# Patient Record
Sex: Female | Born: 1945 | Race: White | Hispanic: No | Marital: Married | Smoking: Never smoker
Health system: Southern US, Community
[De-identification: ages and names within clinical notes are randomized; demographics above are authoritative.]

## PROBLEM LIST (undated history)

## (undated) DIAGNOSIS — F329 Major depressive disorder, single episode, unspecified: Secondary | ICD-10-CM

## (undated) DIAGNOSIS — G4730 Sleep apnea, unspecified: Secondary | ICD-10-CM

## (undated) DIAGNOSIS — E7800 Pure hypercholesterolemia, unspecified: Secondary | ICD-10-CM

## (undated) DIAGNOSIS — G4733 Obstructive sleep apnea (adult) (pediatric): Secondary | ICD-10-CM

## (undated) DIAGNOSIS — T8859XA Other complications of anesthesia, initial encounter: Secondary | ICD-10-CM

## (undated) DIAGNOSIS — Z9889 Other specified postprocedural states: Secondary | ICD-10-CM

## (undated) DIAGNOSIS — J45909 Unspecified asthma, uncomplicated: Secondary | ICD-10-CM

## (undated) DIAGNOSIS — I4891 Unspecified atrial fibrillation: Secondary | ICD-10-CM

## (undated) DIAGNOSIS — I499 Cardiac arrhythmia, unspecified: Secondary | ICD-10-CM

## (undated) DIAGNOSIS — M1990 Unspecified osteoarthritis, unspecified site: Secondary | ICD-10-CM

## (undated) DIAGNOSIS — K219 Gastro-esophageal reflux disease without esophagitis: Secondary | ICD-10-CM

## (undated) DIAGNOSIS — F32A Depression, unspecified: Secondary | ICD-10-CM

## (undated) DIAGNOSIS — R112 Nausea with vomiting, unspecified: Secondary | ICD-10-CM

## (undated) DIAGNOSIS — T4145XA Adverse effect of unspecified anesthetic, initial encounter: Secondary | ICD-10-CM

## (undated) DIAGNOSIS — J449 Chronic obstructive pulmonary disease, unspecified: Secondary | ICD-10-CM

## (undated) DIAGNOSIS — G2581 Restless legs syndrome: Secondary | ICD-10-CM

## (undated) DIAGNOSIS — I10 Essential (primary) hypertension: Secondary | ICD-10-CM

## (undated) DIAGNOSIS — E119 Type 2 diabetes mellitus without complications: Secondary | ICD-10-CM

## (undated) HISTORY — DX: Sleep apnea, unspecified: G47.30

## (undated) HISTORY — DX: Essential (primary) hypertension: I10

## (undated) HISTORY — DX: Unspecified atrial fibrillation: I48.91

## (undated) HISTORY — DX: Obstructive sleep apnea (adult) (pediatric): G47.33

## (undated) HISTORY — DX: Restless legs syndrome: G25.81

## (undated) HISTORY — DX: Chronic obstructive pulmonary disease, unspecified: J44.9

## (undated) HISTORY — DX: Major depressive disorder, single episode, unspecified: F32.9

## (undated) HISTORY — DX: Type 2 diabetes mellitus without complications: E11.9

## (undated) HISTORY — DX: Pure hypercholesterolemia, unspecified: E78.00

## (undated) HISTORY — PX: CATARACT EXTRACTION: SUR2

## (undated) HISTORY — DX: Unspecified osteoarthritis, unspecified site: M19.90

## (undated) HISTORY — DX: Depression, unspecified: F32.A

## (undated) HISTORY — PX: ATRIAL FIBRILLATION ABLATION: EP1191

---
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
## 2012-06-24 DIAGNOSIS — Z6837 Body mass index (BMI) 37.0-37.9, adult: Secondary | ICD-10-CM | POA: Diagnosis not present

## 2012-06-24 DIAGNOSIS — F341 Dysthymic disorder: Secondary | ICD-10-CM | POA: Diagnosis not present

## 2012-06-24 DIAGNOSIS — I10 Essential (primary) hypertension: Secondary | ICD-10-CM | POA: Diagnosis not present

## 2012-07-24 DIAGNOSIS — J189 Pneumonia, unspecified organism: Secondary | ICD-10-CM | POA: Diagnosis not present

## 2012-11-04 DIAGNOSIS — Z1231 Encounter for screening mammogram for malignant neoplasm of breast: Secondary | ICD-10-CM | POA: Diagnosis not present

## 2012-11-26 DIAGNOSIS — I10 Essential (primary) hypertension: Secondary | ICD-10-CM | POA: Diagnosis not present

## 2012-11-26 DIAGNOSIS — J301 Allergic rhinitis due to pollen: Secondary | ICD-10-CM | POA: Diagnosis not present

## 2012-11-26 DIAGNOSIS — R7309 Other abnormal glucose: Secondary | ICD-10-CM | POA: Diagnosis not present

## 2012-11-26 DIAGNOSIS — E782 Mixed hyperlipidemia: Secondary | ICD-10-CM | POA: Diagnosis not present

## 2012-11-26 DIAGNOSIS — E7800 Pure hypercholesterolemia, unspecified: Secondary | ICD-10-CM | POA: Diagnosis not present

## 2012-11-26 DIAGNOSIS — I4891 Unspecified atrial fibrillation: Secondary | ICD-10-CM | POA: Diagnosis not present

## 2012-12-22 DIAGNOSIS — Z1211 Encounter for screening for malignant neoplasm of colon: Secondary | ICD-10-CM | POA: Diagnosis not present

## 2013-01-04 DIAGNOSIS — F3289 Other specified depressive episodes: Secondary | ICD-10-CM | POA: Diagnosis not present

## 2013-01-04 DIAGNOSIS — Z79899 Other long term (current) drug therapy: Secondary | ICD-10-CM | POA: Diagnosis not present

## 2013-01-04 DIAGNOSIS — I10 Essential (primary) hypertension: Secondary | ICD-10-CM | POA: Diagnosis not present

## 2013-01-04 DIAGNOSIS — E7800 Pure hypercholesterolemia, unspecified: Secondary | ICD-10-CM | POA: Diagnosis not present

## 2013-01-04 DIAGNOSIS — F411 Generalized anxiety disorder: Secondary | ICD-10-CM | POA: Diagnosis not present

## 2013-01-04 DIAGNOSIS — I4891 Unspecified atrial fibrillation: Secondary | ICD-10-CM | POA: Diagnosis not present

## 2013-01-04 DIAGNOSIS — K648 Other hemorrhoids: Secondary | ICD-10-CM | POA: Diagnosis not present

## 2013-01-04 DIAGNOSIS — R7309 Other abnormal glucose: Secondary | ICD-10-CM | POA: Diagnosis not present

## 2013-01-04 DIAGNOSIS — K5730 Diverticulosis of large intestine without perforation or abscess without bleeding: Secondary | ICD-10-CM | POA: Diagnosis not present

## 2013-01-04 DIAGNOSIS — F329 Major depressive disorder, single episode, unspecified: Secondary | ICD-10-CM | POA: Diagnosis not present

## 2013-01-04 DIAGNOSIS — Z1211 Encounter for screening for malignant neoplasm of colon: Secondary | ICD-10-CM | POA: Diagnosis not present

## 2013-01-04 DIAGNOSIS — G4733 Obstructive sleep apnea (adult) (pediatric): Secondary | ICD-10-CM | POA: Diagnosis not present

## 2013-01-04 DIAGNOSIS — G2581 Restless legs syndrome: Secondary | ICD-10-CM | POA: Diagnosis not present

## 2013-01-04 HISTORY — PX: COLONOSCOPY: SHX174

## 2013-03-04 DIAGNOSIS — I10 Essential (primary) hypertension: Secondary | ICD-10-CM | POA: Diagnosis not present

## 2013-03-04 DIAGNOSIS — F341 Dysthymic disorder: Secondary | ICD-10-CM | POA: Diagnosis not present

## 2013-03-04 DIAGNOSIS — R7309 Other abnormal glucose: Secondary | ICD-10-CM | POA: Diagnosis not present

## 2013-03-04 DIAGNOSIS — H6120 Impacted cerumen, unspecified ear: Secondary | ICD-10-CM | POA: Diagnosis not present

## 2013-03-04 DIAGNOSIS — D485 Neoplasm of uncertain behavior of skin: Secondary | ICD-10-CM | POA: Diagnosis not present

## 2013-03-04 DIAGNOSIS — I4891 Unspecified atrial fibrillation: Secondary | ICD-10-CM | POA: Diagnosis not present

## 2013-03-04 DIAGNOSIS — E7800 Pure hypercholesterolemia, unspecified: Secondary | ICD-10-CM | POA: Diagnosis not present

## 2013-03-04 DIAGNOSIS — G2581 Restless legs syndrome: Secondary | ICD-10-CM | POA: Diagnosis not present

## 2013-03-05 DIAGNOSIS — G4733 Obstructive sleep apnea (adult) (pediatric): Secondary | ICD-10-CM | POA: Diagnosis not present

## 2013-03-05 DIAGNOSIS — I4891 Unspecified atrial fibrillation: Secondary | ICD-10-CM | POA: Diagnosis not present

## 2013-03-11 DIAGNOSIS — I4891 Unspecified atrial fibrillation: Secondary | ICD-10-CM | POA: Diagnosis not present

## 2013-03-11 DIAGNOSIS — G4733 Obstructive sleep apnea (adult) (pediatric): Secondary | ICD-10-CM | POA: Diagnosis not present

## 2013-03-17 DIAGNOSIS — G4733 Obstructive sleep apnea (adult) (pediatric): Secondary | ICD-10-CM | POA: Diagnosis not present

## 2013-03-17 DIAGNOSIS — I4891 Unspecified atrial fibrillation: Secondary | ICD-10-CM | POA: Diagnosis not present

## 2013-03-23 DIAGNOSIS — I4891 Unspecified atrial fibrillation: Secondary | ICD-10-CM | POA: Diagnosis not present

## 2013-03-23 DIAGNOSIS — G4733 Obstructive sleep apnea (adult) (pediatric): Secondary | ICD-10-CM | POA: Diagnosis not present

## 2013-03-29 DIAGNOSIS — L821 Other seborrheic keratosis: Secondary | ICD-10-CM | POA: Diagnosis not present

## 2013-03-29 DIAGNOSIS — D235 Other benign neoplasm of skin of trunk: Secondary | ICD-10-CM | POA: Diagnosis not present

## 2013-03-29 DIAGNOSIS — D485 Neoplasm of uncertain behavior of skin: Secondary | ICD-10-CM | POA: Diagnosis not present

## 2013-03-29 DIAGNOSIS — L578 Other skin changes due to chronic exposure to nonionizing radiation: Secondary | ICD-10-CM | POA: Diagnosis not present

## 2013-03-29 DIAGNOSIS — L981 Factitial dermatitis: Secondary | ICD-10-CM | POA: Diagnosis not present

## 2013-03-31 DIAGNOSIS — Z886 Allergy status to analgesic agent status: Secondary | ICD-10-CM | POA: Diagnosis not present

## 2013-03-31 DIAGNOSIS — F411 Generalized anxiety disorder: Secondary | ICD-10-CM | POA: Diagnosis not present

## 2013-03-31 DIAGNOSIS — I4891 Unspecified atrial fibrillation: Secondary | ICD-10-CM | POA: Diagnosis not present

## 2013-03-31 DIAGNOSIS — Z7901 Long term (current) use of anticoagulants: Secondary | ICD-10-CM | POA: Diagnosis not present

## 2013-03-31 DIAGNOSIS — G2581 Restless legs syndrome: Secondary | ICD-10-CM | POA: Diagnosis not present

## 2013-03-31 DIAGNOSIS — G43909 Migraine, unspecified, not intractable, without status migrainosus: Secondary | ICD-10-CM | POA: Diagnosis not present

## 2013-03-31 DIAGNOSIS — M19049 Primary osteoarthritis, unspecified hand: Secondary | ICD-10-CM | POA: Diagnosis not present

## 2013-03-31 DIAGNOSIS — Z79899 Other long term (current) drug therapy: Secondary | ICD-10-CM | POA: Diagnosis not present

## 2013-03-31 DIAGNOSIS — I10 Essential (primary) hypertension: Secondary | ICD-10-CM | POA: Diagnosis not present

## 2013-03-31 DIAGNOSIS — K5730 Diverticulosis of large intestine without perforation or abscess without bleeding: Secondary | ICD-10-CM | POA: Diagnosis not present

## 2013-04-12 DIAGNOSIS — D2330 Other benign neoplasm of skin of unspecified part of face: Secondary | ICD-10-CM | POA: Diagnosis not present

## 2013-04-12 DIAGNOSIS — C44319 Basal cell carcinoma of skin of other parts of face: Secondary | ICD-10-CM | POA: Diagnosis not present

## 2013-04-12 DIAGNOSIS — L570 Actinic keratosis: Secondary | ICD-10-CM | POA: Diagnosis not present

## 2013-04-12 DIAGNOSIS — L821 Other seborrheic keratosis: Secondary | ICD-10-CM | POA: Diagnosis not present

## 2013-04-12 DIAGNOSIS — L820 Inflamed seborrheic keratosis: Secondary | ICD-10-CM | POA: Diagnosis not present

## 2013-04-28 DIAGNOSIS — I10 Essential (primary) hypertension: Secondary | ICD-10-CM | POA: Diagnosis not present

## 2013-04-28 DIAGNOSIS — G4733 Obstructive sleep apnea (adult) (pediatric): Secondary | ICD-10-CM | POA: Diagnosis not present

## 2013-04-28 DIAGNOSIS — I4891 Unspecified atrial fibrillation: Secondary | ICD-10-CM | POA: Diagnosis not present

## 2013-05-12 DIAGNOSIS — G4733 Obstructive sleep apnea (adult) (pediatric): Secondary | ICD-10-CM | POA: Diagnosis not present

## 2013-05-12 DIAGNOSIS — I4891 Unspecified atrial fibrillation: Secondary | ICD-10-CM | POA: Diagnosis not present

## 2013-05-12 DIAGNOSIS — E785 Hyperlipidemia, unspecified: Secondary | ICD-10-CM | POA: Diagnosis not present

## 2013-05-12 DIAGNOSIS — I10 Essential (primary) hypertension: Secondary | ICD-10-CM | POA: Diagnosis not present

## 2013-05-28 DIAGNOSIS — I2510 Atherosclerotic heart disease of native coronary artery without angina pectoris: Secondary | ICD-10-CM | POA: Diagnosis not present

## 2013-05-28 DIAGNOSIS — Z886 Allergy status to analgesic agent status: Secondary | ICD-10-CM | POA: Diagnosis not present

## 2013-05-28 DIAGNOSIS — G2581 Restless legs syndrome: Secondary | ICD-10-CM | POA: Diagnosis not present

## 2013-05-28 DIAGNOSIS — I4891 Unspecified atrial fibrillation: Secondary | ICD-10-CM | POA: Diagnosis not present

## 2013-05-28 DIAGNOSIS — R509 Fever, unspecified: Secondary | ICD-10-CM | POA: Diagnosis not present

## 2013-05-28 DIAGNOSIS — F411 Generalized anxiety disorder: Secondary | ICD-10-CM | POA: Diagnosis not present

## 2013-05-28 DIAGNOSIS — Z7901 Long term (current) use of anticoagulants: Secondary | ICD-10-CM | POA: Diagnosis not present

## 2013-05-28 DIAGNOSIS — I10 Essential (primary) hypertension: Secondary | ICD-10-CM | POA: Diagnosis not present

## 2013-05-28 DIAGNOSIS — G4733 Obstructive sleep apnea (adult) (pediatric): Secondary | ICD-10-CM | POA: Diagnosis not present

## 2013-05-28 DIAGNOSIS — M1990 Unspecified osteoarthritis, unspecified site: Secondary | ICD-10-CM | POA: Diagnosis not present

## 2013-05-28 DIAGNOSIS — Z79899 Other long term (current) drug therapy: Secondary | ICD-10-CM | POA: Diagnosis not present

## 2013-05-28 DIAGNOSIS — D72829 Elevated white blood cell count, unspecified: Secondary | ICD-10-CM | POA: Diagnosis not present

## 2013-05-28 DIAGNOSIS — E785 Hyperlipidemia, unspecified: Secondary | ICD-10-CM | POA: Diagnosis not present

## 2013-05-28 DIAGNOSIS — Z23 Encounter for immunization: Secondary | ICD-10-CM | POA: Diagnosis not present

## 2013-05-28 DIAGNOSIS — J309 Allergic rhinitis, unspecified: Secondary | ICD-10-CM | POA: Diagnosis not present

## 2013-05-31 DIAGNOSIS — G4733 Obstructive sleep apnea (adult) (pediatric): Secondary | ICD-10-CM | POA: Diagnosis not present

## 2013-05-31 DIAGNOSIS — Z79899 Other long term (current) drug therapy: Secondary | ICD-10-CM | POA: Diagnosis not present

## 2013-05-31 DIAGNOSIS — E785 Hyperlipidemia, unspecified: Secondary | ICD-10-CM | POA: Diagnosis not present

## 2013-05-31 DIAGNOSIS — D72829 Elevated white blood cell count, unspecified: Secondary | ICD-10-CM | POA: Diagnosis not present

## 2013-05-31 DIAGNOSIS — I4891 Unspecified atrial fibrillation: Secondary | ICD-10-CM | POA: Diagnosis not present

## 2013-05-31 DIAGNOSIS — Z7901 Long term (current) use of anticoagulants: Secondary | ICD-10-CM | POA: Diagnosis not present

## 2013-05-31 DIAGNOSIS — I10 Essential (primary) hypertension: Secondary | ICD-10-CM | POA: Diagnosis not present

## 2013-05-31 DIAGNOSIS — R509 Fever, unspecified: Secondary | ICD-10-CM | POA: Diagnosis not present

## 2013-06-01 DIAGNOSIS — G4733 Obstructive sleep apnea (adult) (pediatric): Secondary | ICD-10-CM | POA: Diagnosis not present

## 2013-06-01 DIAGNOSIS — I10 Essential (primary) hypertension: Secondary | ICD-10-CM | POA: Diagnosis not present

## 2013-06-01 DIAGNOSIS — R509 Fever, unspecified: Secondary | ICD-10-CM | POA: Diagnosis not present

## 2013-06-01 DIAGNOSIS — Z79899 Other long term (current) drug therapy: Secondary | ICD-10-CM | POA: Diagnosis not present

## 2013-06-01 DIAGNOSIS — Z7901 Long term (current) use of anticoagulants: Secondary | ICD-10-CM | POA: Diagnosis not present

## 2013-06-01 DIAGNOSIS — R918 Other nonspecific abnormal finding of lung field: Secondary | ICD-10-CM | POA: Diagnosis not present

## 2013-06-01 DIAGNOSIS — I4891 Unspecified atrial fibrillation: Secondary | ICD-10-CM | POA: Diagnosis not present

## 2013-06-02 DIAGNOSIS — I4891 Unspecified atrial fibrillation: Secondary | ICD-10-CM | POA: Diagnosis not present

## 2013-06-02 DIAGNOSIS — Z79899 Other long term (current) drug therapy: Secondary | ICD-10-CM | POA: Diagnosis not present

## 2013-06-02 DIAGNOSIS — Z7901 Long term (current) use of anticoagulants: Secondary | ICD-10-CM | POA: Diagnosis not present

## 2013-06-02 DIAGNOSIS — R509 Fever, unspecified: Secondary | ICD-10-CM | POA: Diagnosis not present

## 2013-06-02 DIAGNOSIS — G4733 Obstructive sleep apnea (adult) (pediatric): Secondary | ICD-10-CM | POA: Diagnosis not present

## 2013-06-02 DIAGNOSIS — I10 Essential (primary) hypertension: Secondary | ICD-10-CM | POA: Diagnosis not present

## 2013-06-07 DIAGNOSIS — M79609 Pain in unspecified limb: Secondary | ICD-10-CM | POA: Diagnosis not present

## 2013-06-07 DIAGNOSIS — I4891 Unspecified atrial fibrillation: Secondary | ICD-10-CM | POA: Diagnosis not present

## 2013-07-06 DIAGNOSIS — I10 Essential (primary) hypertension: Secondary | ICD-10-CM | POA: Diagnosis not present

## 2013-07-06 DIAGNOSIS — Z9889 Other specified postprocedural states: Secondary | ICD-10-CM | POA: Diagnosis not present

## 2013-07-06 DIAGNOSIS — I4891 Unspecified atrial fibrillation: Secondary | ICD-10-CM | POA: Diagnosis not present

## 2013-07-27 DIAGNOSIS — L570 Actinic keratosis: Secondary | ICD-10-CM | POA: Diagnosis not present

## 2013-07-27 DIAGNOSIS — R7309 Other abnormal glucose: Secondary | ICD-10-CM | POA: Diagnosis not present

## 2013-07-27 DIAGNOSIS — F341 Dysthymic disorder: Secondary | ICD-10-CM | POA: Diagnosis not present

## 2013-07-27 DIAGNOSIS — I4891 Unspecified atrial fibrillation: Secondary | ICD-10-CM | POA: Diagnosis not present

## 2013-07-27 DIAGNOSIS — G2581 Restless legs syndrome: Secondary | ICD-10-CM | POA: Diagnosis not present

## 2013-07-27 DIAGNOSIS — E7800 Pure hypercholesterolemia, unspecified: Secondary | ICD-10-CM | POA: Diagnosis not present

## 2013-07-27 DIAGNOSIS — H6120 Impacted cerumen, unspecified ear: Secondary | ICD-10-CM | POA: Diagnosis not present

## 2013-07-27 DIAGNOSIS — R7301 Impaired fasting glucose: Secondary | ICD-10-CM | POA: Diagnosis not present

## 2013-07-27 DIAGNOSIS — I10 Essential (primary) hypertension: Secondary | ICD-10-CM | POA: Diagnosis not present

## 2013-07-27 DIAGNOSIS — C44319 Basal cell carcinoma of skin of other parts of face: Secondary | ICD-10-CM | POA: Diagnosis not present

## 2013-07-27 DIAGNOSIS — L821 Other seborrheic keratosis: Secondary | ICD-10-CM | POA: Diagnosis not present

## 2013-08-30 DIAGNOSIS — M2559 Pain in other specified joint: Secondary | ICD-10-CM | POA: Diagnosis not present

## 2013-08-30 DIAGNOSIS — F341 Dysthymic disorder: Secondary | ICD-10-CM | POA: Diagnosis not present

## 2013-08-30 DIAGNOSIS — F5102 Adjustment insomnia: Secondary | ICD-10-CM | POA: Diagnosis not present

## 2013-08-30 DIAGNOSIS — M545 Low back pain, unspecified: Secondary | ICD-10-CM | POA: Diagnosis not present

## 2013-09-29 DIAGNOSIS — M3500 Sicca syndrome, unspecified: Secondary | ICD-10-CM | POA: Diagnosis not present

## 2013-09-29 DIAGNOSIS — M2550 Pain in unspecified joint: Secondary | ICD-10-CM | POA: Diagnosis not present

## 2013-09-29 DIAGNOSIS — M79609 Pain in unspecified limb: Secondary | ICD-10-CM | POA: Diagnosis not present

## 2013-09-29 DIAGNOSIS — M19049 Primary osteoarthritis, unspecified hand: Secondary | ICD-10-CM | POA: Diagnosis not present

## 2013-10-28 DIAGNOSIS — R7301 Impaired fasting glucose: Secondary | ICD-10-CM | POA: Diagnosis not present

## 2013-10-28 DIAGNOSIS — N3941 Urge incontinence: Secondary | ICD-10-CM | POA: Diagnosis not present

## 2013-10-28 DIAGNOSIS — I4891 Unspecified atrial fibrillation: Secondary | ICD-10-CM | POA: Diagnosis not present

## 2013-10-28 DIAGNOSIS — I10 Essential (primary) hypertension: Secondary | ICD-10-CM | POA: Diagnosis not present

## 2013-10-28 DIAGNOSIS — Z13820 Encounter for screening for osteoporosis: Secondary | ICD-10-CM | POA: Diagnosis not present

## 2013-10-28 DIAGNOSIS — M3500 Sicca syndrome, unspecified: Secondary | ICD-10-CM | POA: Diagnosis not present

## 2013-10-28 DIAGNOSIS — E7800 Pure hypercholesterolemia, unspecified: Secondary | ICD-10-CM | POA: Diagnosis not present

## 2013-10-28 DIAGNOSIS — F341 Dysthymic disorder: Secondary | ICD-10-CM | POA: Diagnosis not present

## 2013-10-28 DIAGNOSIS — R7309 Other abnormal glucose: Secondary | ICD-10-CM | POA: Diagnosis not present

## 2013-11-09 DIAGNOSIS — Z8262 Family history of osteoporosis: Secondary | ICD-10-CM | POA: Diagnosis not present

## 2013-11-09 DIAGNOSIS — Z1231 Encounter for screening mammogram for malignant neoplasm of breast: Secondary | ICD-10-CM | POA: Diagnosis not present

## 2013-11-09 DIAGNOSIS — Z780 Asymptomatic menopausal state: Secondary | ICD-10-CM | POA: Diagnosis not present

## 2013-11-22 DIAGNOSIS — J069 Acute upper respiratory infection, unspecified: Secondary | ICD-10-CM | POA: Diagnosis not present

## 2013-11-29 DIAGNOSIS — R0602 Shortness of breath: Secondary | ICD-10-CM | POA: Diagnosis not present

## 2013-11-29 DIAGNOSIS — J189 Pneumonia, unspecified organism: Secondary | ICD-10-CM | POA: Diagnosis not present

## 2013-11-29 DIAGNOSIS — J984 Other disorders of lung: Secondary | ICD-10-CM | POA: Diagnosis not present

## 2013-11-29 DIAGNOSIS — J9801 Acute bronchospasm: Secondary | ICD-10-CM | POA: Diagnosis not present

## 2013-11-30 DIAGNOSIS — G4733 Obstructive sleep apnea (adult) (pediatric): Secondary | ICD-10-CM | POA: Diagnosis not present

## 2013-11-30 DIAGNOSIS — E785 Hyperlipidemia, unspecified: Secondary | ICD-10-CM | POA: Diagnosis not present

## 2013-11-30 DIAGNOSIS — I10 Essential (primary) hypertension: Secondary | ICD-10-CM | POA: Diagnosis not present

## 2013-11-30 DIAGNOSIS — I4891 Unspecified atrial fibrillation: Secondary | ICD-10-CM | POA: Diagnosis not present

## 2013-11-30 DIAGNOSIS — I4892 Unspecified atrial flutter: Secondary | ICD-10-CM | POA: Diagnosis not present

## 2013-12-01 DIAGNOSIS — J9801 Acute bronchospasm: Secondary | ICD-10-CM | POA: Diagnosis not present

## 2013-12-01 DIAGNOSIS — J209 Acute bronchitis, unspecified: Secondary | ICD-10-CM | POA: Diagnosis not present

## 2013-12-31 DIAGNOSIS — M79609 Pain in unspecified limb: Secondary | ICD-10-CM | POA: Diagnosis not present

## 2013-12-31 DIAGNOSIS — R609 Edema, unspecified: Secondary | ICD-10-CM | POA: Diagnosis not present

## 2014-01-03 DIAGNOSIS — R609 Edema, unspecified: Secondary | ICD-10-CM | POA: Diagnosis not present

## 2014-03-21 DIAGNOSIS — E119 Type 2 diabetes mellitus without complications: Secondary | ICD-10-CM | POA: Diagnosis not present

## 2014-03-21 DIAGNOSIS — I798 Other disorders of arteries, arterioles and capillaries in diseases classified elsewhere: Secondary | ICD-10-CM | POA: Diagnosis not present

## 2014-03-21 DIAGNOSIS — R072 Precordial pain: Secondary | ICD-10-CM | POA: Diagnosis not present

## 2014-03-21 DIAGNOSIS — E1159 Type 2 diabetes mellitus with other circulatory complications: Secondary | ICD-10-CM | POA: Diagnosis not present

## 2014-03-21 DIAGNOSIS — Z79899 Other long term (current) drug therapy: Secondary | ICD-10-CM | POA: Diagnosis not present

## 2014-03-21 DIAGNOSIS — R062 Wheezing: Secondary | ICD-10-CM | POA: Diagnosis not present

## 2014-03-21 DIAGNOSIS — I200 Unstable angina: Secondary | ICD-10-CM | POA: Diagnosis not present

## 2014-03-21 DIAGNOSIS — R079 Chest pain, unspecified: Secondary | ICD-10-CM | POA: Diagnosis not present

## 2014-03-21 DIAGNOSIS — I4892 Unspecified atrial flutter: Secondary | ICD-10-CM | POA: Diagnosis not present

## 2014-03-21 DIAGNOSIS — F3289 Other specified depressive episodes: Secondary | ICD-10-CM | POA: Diagnosis present

## 2014-03-21 DIAGNOSIS — I509 Heart failure, unspecified: Secondary | ICD-10-CM | POA: Diagnosis not present

## 2014-03-21 DIAGNOSIS — J029 Acute pharyngitis, unspecified: Secondary | ICD-10-CM | POA: Diagnosis not present

## 2014-03-21 DIAGNOSIS — I369 Nonrheumatic tricuspid valve disorder, unspecified: Secondary | ICD-10-CM | POA: Diagnosis not present

## 2014-03-21 DIAGNOSIS — F329 Major depressive disorder, single episode, unspecified: Secondary | ICD-10-CM | POA: Diagnosis present

## 2014-03-21 DIAGNOSIS — F411 Generalized anxiety disorder: Secondary | ICD-10-CM | POA: Diagnosis present

## 2014-03-21 DIAGNOSIS — M1990 Unspecified osteoarthritis, unspecified site: Secondary | ICD-10-CM | POA: Diagnosis present

## 2014-03-21 DIAGNOSIS — I110 Hypertensive heart disease with heart failure: Secondary | ICD-10-CM | POA: Diagnosis not present

## 2014-03-21 DIAGNOSIS — J189 Pneumonia, unspecified organism: Secondary | ICD-10-CM | POA: Diagnosis not present

## 2014-03-21 DIAGNOSIS — G4733 Obstructive sleep apnea (adult) (pediatric): Secondary | ICD-10-CM | POA: Diagnosis present

## 2014-03-21 DIAGNOSIS — R32 Unspecified urinary incontinence: Secondary | ICD-10-CM | POA: Diagnosis not present

## 2014-03-21 DIAGNOSIS — I498 Other specified cardiac arrhythmias: Secondary | ICD-10-CM | POA: Diagnosis present

## 2014-03-21 DIAGNOSIS — J438 Other emphysema: Secondary | ICD-10-CM | POA: Diagnosis not present

## 2014-03-21 DIAGNOSIS — I5031 Acute diastolic (congestive) heart failure: Secondary | ICD-10-CM | POA: Diagnosis not present

## 2014-03-21 DIAGNOSIS — E039 Hypothyroidism, unspecified: Secondary | ICD-10-CM | POA: Diagnosis present

## 2014-03-21 DIAGNOSIS — I10 Essential (primary) hypertension: Secondary | ICD-10-CM | POA: Diagnosis not present

## 2014-03-21 DIAGNOSIS — I4891 Unspecified atrial fibrillation: Secondary | ICD-10-CM | POA: Diagnosis not present

## 2014-03-22 DIAGNOSIS — I369 Nonrheumatic tricuspid valve disorder, unspecified: Secondary | ICD-10-CM | POA: Diagnosis not present

## 2014-03-22 DIAGNOSIS — I200 Unstable angina: Secondary | ICD-10-CM | POA: Diagnosis not present

## 2014-03-22 DIAGNOSIS — I10 Essential (primary) hypertension: Secondary | ICD-10-CM | POA: Diagnosis not present

## 2014-03-22 DIAGNOSIS — E1159 Type 2 diabetes mellitus with other circulatory complications: Secondary | ICD-10-CM | POA: Diagnosis not present

## 2014-03-22 DIAGNOSIS — I4891 Unspecified atrial fibrillation: Secondary | ICD-10-CM | POA: Diagnosis not present

## 2014-03-22 DIAGNOSIS — I5031 Acute diastolic (congestive) heart failure: Secondary | ICD-10-CM | POA: Diagnosis not present

## 2014-03-23 DIAGNOSIS — I10 Essential (primary) hypertension: Secondary | ICD-10-CM | POA: Diagnosis not present

## 2014-03-23 DIAGNOSIS — I798 Other disorders of arteries, arterioles and capillaries in diseases classified elsewhere: Secondary | ICD-10-CM | POA: Diagnosis not present

## 2014-03-23 DIAGNOSIS — E1159 Type 2 diabetes mellitus with other circulatory complications: Secondary | ICD-10-CM | POA: Diagnosis not present

## 2014-03-23 DIAGNOSIS — I5031 Acute diastolic (congestive) heart failure: Secondary | ICD-10-CM | POA: Diagnosis not present

## 2014-03-23 DIAGNOSIS — I200 Unstable angina: Secondary | ICD-10-CM | POA: Diagnosis not present

## 2014-03-23 DIAGNOSIS — I110 Hypertensive heart disease with heart failure: Secondary | ICD-10-CM | POA: Diagnosis not present

## 2014-03-23 DIAGNOSIS — I509 Heart failure, unspecified: Secondary | ICD-10-CM | POA: Diagnosis not present

## 2014-03-23 DIAGNOSIS — I4891 Unspecified atrial fibrillation: Secondary | ICD-10-CM | POA: Diagnosis not present

## 2014-03-23 DIAGNOSIS — I4892 Unspecified atrial flutter: Secondary | ICD-10-CM | POA: Diagnosis not present

## 2014-03-24 DIAGNOSIS — I200 Unstable angina: Secondary | ICD-10-CM | POA: Diagnosis not present

## 2014-03-24 DIAGNOSIS — E1159 Type 2 diabetes mellitus with other circulatory complications: Secondary | ICD-10-CM | POA: Diagnosis not present

## 2014-03-24 DIAGNOSIS — I10 Essential (primary) hypertension: Secondary | ICD-10-CM | POA: Diagnosis not present

## 2014-03-24 DIAGNOSIS — I4891 Unspecified atrial fibrillation: Secondary | ICD-10-CM | POA: Diagnosis not present

## 2014-03-24 DIAGNOSIS — I5031 Acute diastolic (congestive) heart failure: Secondary | ICD-10-CM | POA: Diagnosis not present

## 2014-03-25 DIAGNOSIS — I200 Unstable angina: Secondary | ICD-10-CM | POA: Diagnosis not present

## 2014-03-25 DIAGNOSIS — I4891 Unspecified atrial fibrillation: Secondary | ICD-10-CM | POA: Diagnosis not present

## 2014-03-25 DIAGNOSIS — I10 Essential (primary) hypertension: Secondary | ICD-10-CM | POA: Diagnosis not present

## 2014-03-25 DIAGNOSIS — E1159 Type 2 diabetes mellitus with other circulatory complications: Secondary | ICD-10-CM | POA: Diagnosis not present

## 2014-03-25 DIAGNOSIS — I5031 Acute diastolic (congestive) heart failure: Secondary | ICD-10-CM | POA: Diagnosis not present

## 2014-03-26 DIAGNOSIS — G4733 Obstructive sleep apnea (adult) (pediatric): Secondary | ICD-10-CM | POA: Diagnosis not present

## 2014-03-26 DIAGNOSIS — I498 Other specified cardiac arrhythmias: Secondary | ICD-10-CM | POA: Diagnosis not present

## 2014-03-26 DIAGNOSIS — I10 Essential (primary) hypertension: Secondary | ICD-10-CM | POA: Diagnosis not present

## 2014-03-26 DIAGNOSIS — I200 Unstable angina: Secondary | ICD-10-CM | POA: Diagnosis not present

## 2014-03-26 DIAGNOSIS — I5031 Acute diastolic (congestive) heart failure: Secondary | ICD-10-CM | POA: Diagnosis not present

## 2014-03-26 DIAGNOSIS — E119 Type 2 diabetes mellitus without complications: Secondary | ICD-10-CM | POA: Diagnosis not present

## 2014-03-28 DIAGNOSIS — E119 Type 2 diabetes mellitus without complications: Secondary | ICD-10-CM | POA: Diagnosis not present

## 2014-03-28 DIAGNOSIS — F341 Dysthymic disorder: Secondary | ICD-10-CM | POA: Diagnosis not present

## 2014-03-28 DIAGNOSIS — E038 Other specified hypothyroidism: Secondary | ICD-10-CM | POA: Diagnosis not present

## 2014-03-28 DIAGNOSIS — I10 Essential (primary) hypertension: Secondary | ICD-10-CM | POA: Diagnosis not present

## 2014-03-28 DIAGNOSIS — I4891 Unspecified atrial fibrillation: Secondary | ICD-10-CM | POA: Diagnosis not present

## 2014-03-28 DIAGNOSIS — I509 Heart failure, unspecified: Secondary | ICD-10-CM | POA: Diagnosis not present

## 2014-03-29 DIAGNOSIS — I5031 Acute diastolic (congestive) heart failure: Secondary | ICD-10-CM | POA: Diagnosis not present

## 2014-03-29 DIAGNOSIS — I498 Other specified cardiac arrhythmias: Secondary | ICD-10-CM | POA: Diagnosis not present

## 2014-03-29 DIAGNOSIS — E119 Type 2 diabetes mellitus without complications: Secondary | ICD-10-CM | POA: Diagnosis not present

## 2014-03-29 DIAGNOSIS — I10 Essential (primary) hypertension: Secondary | ICD-10-CM | POA: Diagnosis not present

## 2014-03-29 DIAGNOSIS — I200 Unstable angina: Secondary | ICD-10-CM | POA: Diagnosis not present

## 2014-03-29 DIAGNOSIS — G4733 Obstructive sleep apnea (adult) (pediatric): Secondary | ICD-10-CM | POA: Diagnosis not present

## 2014-03-30 DIAGNOSIS — G4733 Obstructive sleep apnea (adult) (pediatric): Secondary | ICD-10-CM | POA: Diagnosis not present

## 2014-03-30 DIAGNOSIS — E119 Type 2 diabetes mellitus without complications: Secondary | ICD-10-CM | POA: Diagnosis not present

## 2014-03-30 DIAGNOSIS — I10 Essential (primary) hypertension: Secondary | ICD-10-CM | POA: Diagnosis not present

## 2014-03-30 DIAGNOSIS — I498 Other specified cardiac arrhythmias: Secondary | ICD-10-CM | POA: Diagnosis not present

## 2014-03-30 DIAGNOSIS — I200 Unstable angina: Secondary | ICD-10-CM | POA: Diagnosis not present

## 2014-03-30 DIAGNOSIS — I5031 Acute diastolic (congestive) heart failure: Secondary | ICD-10-CM | POA: Diagnosis not present

## 2014-04-01 DIAGNOSIS — I10 Essential (primary) hypertension: Secondary | ICD-10-CM | POA: Diagnosis not present

## 2014-04-01 DIAGNOSIS — G4733 Obstructive sleep apnea (adult) (pediatric): Secondary | ICD-10-CM | POA: Diagnosis not present

## 2014-04-01 DIAGNOSIS — E119 Type 2 diabetes mellitus without complications: Secondary | ICD-10-CM | POA: Diagnosis not present

## 2014-04-01 DIAGNOSIS — I200 Unstable angina: Secondary | ICD-10-CM | POA: Diagnosis not present

## 2014-04-01 DIAGNOSIS — I5031 Acute diastolic (congestive) heart failure: Secondary | ICD-10-CM | POA: Diagnosis not present

## 2014-04-01 DIAGNOSIS — I498 Other specified cardiac arrhythmias: Secondary | ICD-10-CM | POA: Diagnosis not present

## 2014-04-07 DIAGNOSIS — R609 Edema, unspecified: Secondary | ICD-10-CM | POA: Diagnosis not present

## 2014-04-07 DIAGNOSIS — E785 Hyperlipidemia, unspecified: Secondary | ICD-10-CM | POA: Diagnosis not present

## 2014-04-07 DIAGNOSIS — I4891 Unspecified atrial fibrillation: Secondary | ICD-10-CM | POA: Diagnosis not present

## 2014-04-07 DIAGNOSIS — I10 Essential (primary) hypertension: Secondary | ICD-10-CM | POA: Diagnosis not present

## 2014-04-07 DIAGNOSIS — G4733 Obstructive sleep apnea (adult) (pediatric): Secondary | ICD-10-CM | POA: Diagnosis not present

## 2014-04-08 DIAGNOSIS — E119 Type 2 diabetes mellitus without complications: Secondary | ICD-10-CM | POA: Diagnosis not present

## 2014-04-08 DIAGNOSIS — I10 Essential (primary) hypertension: Secondary | ICD-10-CM | POA: Diagnosis not present

## 2014-04-08 DIAGNOSIS — I200 Unstable angina: Secondary | ICD-10-CM | POA: Diagnosis not present

## 2014-04-08 DIAGNOSIS — I5031 Acute diastolic (congestive) heart failure: Secondary | ICD-10-CM | POA: Diagnosis not present

## 2014-04-08 DIAGNOSIS — I498 Other specified cardiac arrhythmias: Secondary | ICD-10-CM | POA: Diagnosis not present

## 2014-04-08 DIAGNOSIS — G4733 Obstructive sleep apnea (adult) (pediatric): Secondary | ICD-10-CM | POA: Diagnosis not present

## 2014-04-12 DIAGNOSIS — I5031 Acute diastolic (congestive) heart failure: Secondary | ICD-10-CM | POA: Diagnosis not present

## 2014-04-12 DIAGNOSIS — I10 Essential (primary) hypertension: Secondary | ICD-10-CM | POA: Diagnosis not present

## 2014-04-12 DIAGNOSIS — I498 Other specified cardiac arrhythmias: Secondary | ICD-10-CM | POA: Diagnosis not present

## 2014-04-12 DIAGNOSIS — G4733 Obstructive sleep apnea (adult) (pediatric): Secondary | ICD-10-CM | POA: Diagnosis not present

## 2014-04-12 DIAGNOSIS — I200 Unstable angina: Secondary | ICD-10-CM | POA: Diagnosis not present

## 2014-04-12 DIAGNOSIS — E119 Type 2 diabetes mellitus without complications: Secondary | ICD-10-CM | POA: Diagnosis not present

## 2014-04-20 DIAGNOSIS — G4733 Obstructive sleep apnea (adult) (pediatric): Secondary | ICD-10-CM | POA: Diagnosis not present

## 2014-04-20 DIAGNOSIS — I10 Essential (primary) hypertension: Secondary | ICD-10-CM | POA: Diagnosis not present

## 2014-04-20 DIAGNOSIS — I5031 Acute diastolic (congestive) heart failure: Secondary | ICD-10-CM | POA: Diagnosis not present

## 2014-04-20 DIAGNOSIS — I200 Unstable angina: Secondary | ICD-10-CM | POA: Diagnosis not present

## 2014-04-20 DIAGNOSIS — E119 Type 2 diabetes mellitus without complications: Secondary | ICD-10-CM | POA: Diagnosis not present

## 2014-04-20 DIAGNOSIS — I498 Other specified cardiac arrhythmias: Secondary | ICD-10-CM | POA: Diagnosis not present

## 2014-04-28 DIAGNOSIS — I498 Other specified cardiac arrhythmias: Secondary | ICD-10-CM | POA: Diagnosis not present

## 2014-04-28 DIAGNOSIS — I10 Essential (primary) hypertension: Secondary | ICD-10-CM | POA: Diagnosis not present

## 2014-04-28 DIAGNOSIS — G4733 Obstructive sleep apnea (adult) (pediatric): Secondary | ICD-10-CM | POA: Diagnosis not present

## 2014-04-28 DIAGNOSIS — I5031 Acute diastolic (congestive) heart failure: Secondary | ICD-10-CM | POA: Diagnosis not present

## 2014-04-28 DIAGNOSIS — I509 Heart failure, unspecified: Secondary | ICD-10-CM | POA: Diagnosis not present

## 2014-04-28 DIAGNOSIS — I200 Unstable angina: Secondary | ICD-10-CM | POA: Diagnosis not present

## 2014-04-28 DIAGNOSIS — E119 Type 2 diabetes mellitus without complications: Secondary | ICD-10-CM | POA: Diagnosis not present

## 2014-05-02 DIAGNOSIS — Z23 Encounter for immunization: Secondary | ICD-10-CM | POA: Diagnosis not present

## 2014-05-02 DIAGNOSIS — F341 Dysthymic disorder: Secondary | ICD-10-CM | POA: Diagnosis not present

## 2014-05-02 DIAGNOSIS — R351 Nocturia: Secondary | ICD-10-CM | POA: Diagnosis not present

## 2014-05-02 DIAGNOSIS — E7800 Pure hypercholesterolemia, unspecified: Secondary | ICD-10-CM | POA: Diagnosis not present

## 2014-05-02 DIAGNOSIS — G4733 Obstructive sleep apnea (adult) (pediatric): Secondary | ICD-10-CM | POA: Diagnosis not present

## 2014-05-02 DIAGNOSIS — N3941 Urge incontinence: Secondary | ICD-10-CM | POA: Diagnosis not present

## 2014-05-02 DIAGNOSIS — I4891 Unspecified atrial fibrillation: Secondary | ICD-10-CM | POA: Diagnosis not present

## 2014-05-02 DIAGNOSIS — E038 Other specified hypothyroidism: Secondary | ICD-10-CM | POA: Diagnosis not present

## 2014-05-02 DIAGNOSIS — IMO0001 Reserved for inherently not codable concepts without codable children: Secondary | ICD-10-CM | POA: Diagnosis not present

## 2014-05-10 DIAGNOSIS — I10 Essential (primary) hypertension: Secondary | ICD-10-CM | POA: Diagnosis not present

## 2014-05-10 DIAGNOSIS — E785 Hyperlipidemia, unspecified: Secondary | ICD-10-CM | POA: Diagnosis not present

## 2014-05-10 DIAGNOSIS — I4892 Unspecified atrial flutter: Secondary | ICD-10-CM | POA: Diagnosis not present

## 2014-05-26 DIAGNOSIS — M9903 Segmental and somatic dysfunction of lumbar region: Secondary | ICD-10-CM | POA: Diagnosis not present

## 2014-05-26 DIAGNOSIS — M9905 Segmental and somatic dysfunction of pelvic region: Secondary | ICD-10-CM | POA: Diagnosis not present

## 2014-05-26 DIAGNOSIS — M5137 Other intervertebral disc degeneration, lumbosacral region: Secondary | ICD-10-CM | POA: Diagnosis not present

## 2014-05-26 DIAGNOSIS — M4317 Spondylolisthesis, lumbosacral region: Secondary | ICD-10-CM | POA: Diagnosis not present

## 2014-05-26 DIAGNOSIS — M5032 Other cervical disc degeneration, mid-cervical region: Secondary | ICD-10-CM | POA: Diagnosis not present

## 2014-05-26 DIAGNOSIS — M9904 Segmental and somatic dysfunction of sacral region: Secondary | ICD-10-CM | POA: Diagnosis not present

## 2014-05-26 DIAGNOSIS — M5415 Radiculopathy, thoracolumbar region: Secondary | ICD-10-CM | POA: Diagnosis not present

## 2014-05-26 DIAGNOSIS — M5412 Radiculopathy, cervical region: Secondary | ICD-10-CM | POA: Diagnosis not present

## 2014-05-27 DIAGNOSIS — M5032 Other cervical disc degeneration, mid-cervical region: Secondary | ICD-10-CM | POA: Diagnosis not present

## 2014-05-27 DIAGNOSIS — M4317 Spondylolisthesis, lumbosacral region: Secondary | ICD-10-CM | POA: Diagnosis not present

## 2014-05-27 DIAGNOSIS — M5137 Other intervertebral disc degeneration, lumbosacral region: Secondary | ICD-10-CM | POA: Diagnosis not present

## 2014-05-27 DIAGNOSIS — M9905 Segmental and somatic dysfunction of pelvic region: Secondary | ICD-10-CM | POA: Diagnosis not present

## 2014-05-27 DIAGNOSIS — M5415 Radiculopathy, thoracolumbar region: Secondary | ICD-10-CM | POA: Diagnosis not present

## 2014-05-27 DIAGNOSIS — M9903 Segmental and somatic dysfunction of lumbar region: Secondary | ICD-10-CM | POA: Diagnosis not present

## 2014-05-27 DIAGNOSIS — M5412 Radiculopathy, cervical region: Secondary | ICD-10-CM | POA: Diagnosis not present

## 2014-05-27 DIAGNOSIS — M9904 Segmental and somatic dysfunction of sacral region: Secondary | ICD-10-CM | POA: Diagnosis not present

## 2014-05-30 DIAGNOSIS — M5415 Radiculopathy, thoracolumbar region: Secondary | ICD-10-CM | POA: Diagnosis not present

## 2014-05-30 DIAGNOSIS — M9905 Segmental and somatic dysfunction of pelvic region: Secondary | ICD-10-CM | POA: Diagnosis not present

## 2014-05-30 DIAGNOSIS — M5412 Radiculopathy, cervical region: Secondary | ICD-10-CM | POA: Diagnosis not present

## 2014-05-30 DIAGNOSIS — M4317 Spondylolisthesis, lumbosacral region: Secondary | ICD-10-CM | POA: Diagnosis not present

## 2014-05-30 DIAGNOSIS — M9903 Segmental and somatic dysfunction of lumbar region: Secondary | ICD-10-CM | POA: Diagnosis not present

## 2014-05-30 DIAGNOSIS — M9904 Segmental and somatic dysfunction of sacral region: Secondary | ICD-10-CM | POA: Diagnosis not present

## 2014-05-30 DIAGNOSIS — M5137 Other intervertebral disc degeneration, lumbosacral region: Secondary | ICD-10-CM | POA: Diagnosis not present

## 2014-05-30 DIAGNOSIS — M5032 Other cervical disc degeneration, mid-cervical region: Secondary | ICD-10-CM | POA: Diagnosis not present

## 2014-06-01 DIAGNOSIS — M5415 Radiculopathy, thoracolumbar region: Secondary | ICD-10-CM | POA: Diagnosis not present

## 2014-06-01 DIAGNOSIS — M5032 Other cervical disc degeneration, mid-cervical region: Secondary | ICD-10-CM | POA: Diagnosis not present

## 2014-06-01 DIAGNOSIS — M5137 Other intervertebral disc degeneration, lumbosacral region: Secondary | ICD-10-CM | POA: Diagnosis not present

## 2014-06-01 DIAGNOSIS — M9903 Segmental and somatic dysfunction of lumbar region: Secondary | ICD-10-CM | POA: Diagnosis not present

## 2014-06-01 DIAGNOSIS — M5412 Radiculopathy, cervical region: Secondary | ICD-10-CM | POA: Diagnosis not present

## 2014-06-01 DIAGNOSIS — M9904 Segmental and somatic dysfunction of sacral region: Secondary | ICD-10-CM | POA: Diagnosis not present

## 2014-06-01 DIAGNOSIS — M9905 Segmental and somatic dysfunction of pelvic region: Secondary | ICD-10-CM | POA: Diagnosis not present

## 2014-06-01 DIAGNOSIS — M4317 Spondylolisthesis, lumbosacral region: Secondary | ICD-10-CM | POA: Diagnosis not present

## 2014-06-02 DIAGNOSIS — M5137 Other intervertebral disc degeneration, lumbosacral region: Secondary | ICD-10-CM | POA: Diagnosis not present

## 2014-06-02 DIAGNOSIS — M5032 Other cervical disc degeneration, mid-cervical region: Secondary | ICD-10-CM | POA: Diagnosis not present

## 2014-06-02 DIAGNOSIS — M9905 Segmental and somatic dysfunction of pelvic region: Secondary | ICD-10-CM | POA: Diagnosis not present

## 2014-06-02 DIAGNOSIS — M5412 Radiculopathy, cervical region: Secondary | ICD-10-CM | POA: Diagnosis not present

## 2014-06-02 DIAGNOSIS — M9903 Segmental and somatic dysfunction of lumbar region: Secondary | ICD-10-CM | POA: Diagnosis not present

## 2014-06-02 DIAGNOSIS — M5415 Radiculopathy, thoracolumbar region: Secondary | ICD-10-CM | POA: Diagnosis not present

## 2014-06-02 DIAGNOSIS — M9904 Segmental and somatic dysfunction of sacral region: Secondary | ICD-10-CM | POA: Diagnosis not present

## 2014-06-02 DIAGNOSIS — M4317 Spondylolisthesis, lumbosacral region: Secondary | ICD-10-CM | POA: Diagnosis not present

## 2014-06-06 DIAGNOSIS — M5412 Radiculopathy, cervical region: Secondary | ICD-10-CM | POA: Diagnosis not present

## 2014-06-06 DIAGNOSIS — M9904 Segmental and somatic dysfunction of sacral region: Secondary | ICD-10-CM | POA: Diagnosis not present

## 2014-06-06 DIAGNOSIS — M5415 Radiculopathy, thoracolumbar region: Secondary | ICD-10-CM | POA: Diagnosis not present

## 2014-06-06 DIAGNOSIS — M5032 Other cervical disc degeneration, mid-cervical region: Secondary | ICD-10-CM | POA: Diagnosis not present

## 2014-06-06 DIAGNOSIS — M4317 Spondylolisthesis, lumbosacral region: Secondary | ICD-10-CM | POA: Diagnosis not present

## 2014-06-06 DIAGNOSIS — M9903 Segmental and somatic dysfunction of lumbar region: Secondary | ICD-10-CM | POA: Diagnosis not present

## 2014-06-06 DIAGNOSIS — M9905 Segmental and somatic dysfunction of pelvic region: Secondary | ICD-10-CM | POA: Diagnosis not present

## 2014-06-06 DIAGNOSIS — M5137 Other intervertebral disc degeneration, lumbosacral region: Secondary | ICD-10-CM | POA: Diagnosis not present

## 2014-06-08 DIAGNOSIS — M5137 Other intervertebral disc degeneration, lumbosacral region: Secondary | ICD-10-CM | POA: Diagnosis not present

## 2014-06-08 DIAGNOSIS — M4317 Spondylolisthesis, lumbosacral region: Secondary | ICD-10-CM | POA: Diagnosis not present

## 2014-06-08 DIAGNOSIS — M5032 Other cervical disc degeneration, mid-cervical region: Secondary | ICD-10-CM | POA: Diagnosis not present

## 2014-06-08 DIAGNOSIS — M9903 Segmental and somatic dysfunction of lumbar region: Secondary | ICD-10-CM | POA: Diagnosis not present

## 2014-06-08 DIAGNOSIS — M5415 Radiculopathy, thoracolumbar region: Secondary | ICD-10-CM | POA: Diagnosis not present

## 2014-06-08 DIAGNOSIS — M9905 Segmental and somatic dysfunction of pelvic region: Secondary | ICD-10-CM | POA: Diagnosis not present

## 2014-06-08 DIAGNOSIS — M5412 Radiculopathy, cervical region: Secondary | ICD-10-CM | POA: Diagnosis not present

## 2014-06-08 DIAGNOSIS — M9904 Segmental and somatic dysfunction of sacral region: Secondary | ICD-10-CM | POA: Diagnosis not present

## 2014-06-09 DIAGNOSIS — M5412 Radiculopathy, cervical region: Secondary | ICD-10-CM | POA: Diagnosis not present

## 2014-06-09 DIAGNOSIS — M5137 Other intervertebral disc degeneration, lumbosacral region: Secondary | ICD-10-CM | POA: Diagnosis not present

## 2014-06-09 DIAGNOSIS — M5032 Other cervical disc degeneration, mid-cervical region: Secondary | ICD-10-CM | POA: Diagnosis not present

## 2014-06-09 DIAGNOSIS — M9905 Segmental and somatic dysfunction of pelvic region: Secondary | ICD-10-CM | POA: Diagnosis not present

## 2014-06-09 DIAGNOSIS — M4317 Spondylolisthesis, lumbosacral region: Secondary | ICD-10-CM | POA: Diagnosis not present

## 2014-06-09 DIAGNOSIS — M9904 Segmental and somatic dysfunction of sacral region: Secondary | ICD-10-CM | POA: Diagnosis not present

## 2014-06-09 DIAGNOSIS — M9903 Segmental and somatic dysfunction of lumbar region: Secondary | ICD-10-CM | POA: Diagnosis not present

## 2014-06-09 DIAGNOSIS — M5415 Radiculopathy, thoracolumbar region: Secondary | ICD-10-CM | POA: Diagnosis not present

## 2014-06-10 DIAGNOSIS — M9905 Segmental and somatic dysfunction of pelvic region: Secondary | ICD-10-CM | POA: Diagnosis not present

## 2014-06-10 DIAGNOSIS — M9903 Segmental and somatic dysfunction of lumbar region: Secondary | ICD-10-CM | POA: Diagnosis not present

## 2014-06-10 DIAGNOSIS — M5032 Other cervical disc degeneration, mid-cervical region: Secondary | ICD-10-CM | POA: Diagnosis not present

## 2014-06-10 DIAGNOSIS — M4317 Spondylolisthesis, lumbosacral region: Secondary | ICD-10-CM | POA: Diagnosis not present

## 2014-06-10 DIAGNOSIS — M5137 Other intervertebral disc degeneration, lumbosacral region: Secondary | ICD-10-CM | POA: Diagnosis not present

## 2014-06-10 DIAGNOSIS — M9904 Segmental and somatic dysfunction of sacral region: Secondary | ICD-10-CM | POA: Diagnosis not present

## 2014-06-10 DIAGNOSIS — M5415 Radiculopathy, thoracolumbar region: Secondary | ICD-10-CM | POA: Diagnosis not present

## 2014-06-10 DIAGNOSIS — M5412 Radiculopathy, cervical region: Secondary | ICD-10-CM | POA: Diagnosis not present

## 2014-06-13 DIAGNOSIS — M4317 Spondylolisthesis, lumbosacral region: Secondary | ICD-10-CM | POA: Diagnosis not present

## 2014-06-13 DIAGNOSIS — M5415 Radiculopathy, thoracolumbar region: Secondary | ICD-10-CM | POA: Diagnosis not present

## 2014-06-13 DIAGNOSIS — M9905 Segmental and somatic dysfunction of pelvic region: Secondary | ICD-10-CM | POA: Diagnosis not present

## 2014-06-13 DIAGNOSIS — M5412 Radiculopathy, cervical region: Secondary | ICD-10-CM | POA: Diagnosis not present

## 2014-06-13 DIAGNOSIS — M5137 Other intervertebral disc degeneration, lumbosacral region: Secondary | ICD-10-CM | POA: Diagnosis not present

## 2014-06-13 DIAGNOSIS — M5032 Other cervical disc degeneration, mid-cervical region: Secondary | ICD-10-CM | POA: Diagnosis not present

## 2014-06-13 DIAGNOSIS — M9904 Segmental and somatic dysfunction of sacral region: Secondary | ICD-10-CM | POA: Diagnosis not present

## 2014-06-13 DIAGNOSIS — M9903 Segmental and somatic dysfunction of lumbar region: Secondary | ICD-10-CM | POA: Diagnosis not present

## 2014-06-14 DIAGNOSIS — M9904 Segmental and somatic dysfunction of sacral region: Secondary | ICD-10-CM | POA: Diagnosis not present

## 2014-06-14 DIAGNOSIS — M9905 Segmental and somatic dysfunction of pelvic region: Secondary | ICD-10-CM | POA: Diagnosis not present

## 2014-06-14 DIAGNOSIS — M5415 Radiculopathy, thoracolumbar region: Secondary | ICD-10-CM | POA: Diagnosis not present

## 2014-06-14 DIAGNOSIS — M4317 Spondylolisthesis, lumbosacral region: Secondary | ICD-10-CM | POA: Diagnosis not present

## 2014-06-14 DIAGNOSIS — M5032 Other cervical disc degeneration, mid-cervical region: Secondary | ICD-10-CM | POA: Diagnosis not present

## 2014-06-14 DIAGNOSIS — M5412 Radiculopathy, cervical region: Secondary | ICD-10-CM | POA: Diagnosis not present

## 2014-06-14 DIAGNOSIS — M9903 Segmental and somatic dysfunction of lumbar region: Secondary | ICD-10-CM | POA: Diagnosis not present

## 2014-06-14 DIAGNOSIS — M5137 Other intervertebral disc degeneration, lumbosacral region: Secondary | ICD-10-CM | POA: Diagnosis not present

## 2014-06-20 DIAGNOSIS — R600 Localized edema: Secondary | ICD-10-CM | POA: Diagnosis not present

## 2014-06-20 DIAGNOSIS — M5412 Radiculopathy, cervical region: Secondary | ICD-10-CM | POA: Diagnosis not present

## 2014-06-20 DIAGNOSIS — G4733 Obstructive sleep apnea (adult) (pediatric): Secondary | ICD-10-CM | POA: Diagnosis not present

## 2014-06-20 DIAGNOSIS — M5415 Radiculopathy, thoracolumbar region: Secondary | ICD-10-CM | POA: Diagnosis not present

## 2014-06-20 DIAGNOSIS — M4317 Spondylolisthesis, lumbosacral region: Secondary | ICD-10-CM | POA: Diagnosis not present

## 2014-06-20 DIAGNOSIS — I10 Essential (primary) hypertension: Secondary | ICD-10-CM | POA: Diagnosis not present

## 2014-06-20 DIAGNOSIS — I4892 Unspecified atrial flutter: Secondary | ICD-10-CM | POA: Diagnosis not present

## 2014-06-20 DIAGNOSIS — E119 Type 2 diabetes mellitus without complications: Secondary | ICD-10-CM | POA: Diagnosis not present

## 2014-06-20 DIAGNOSIS — M5032 Other cervical disc degeneration, mid-cervical region: Secondary | ICD-10-CM | POA: Diagnosis not present

## 2014-06-20 DIAGNOSIS — M9905 Segmental and somatic dysfunction of pelvic region: Secondary | ICD-10-CM | POA: Diagnosis not present

## 2014-06-20 DIAGNOSIS — M5137 Other intervertebral disc degeneration, lumbosacral region: Secondary | ICD-10-CM | POA: Diagnosis not present

## 2014-06-20 DIAGNOSIS — E785 Hyperlipidemia, unspecified: Secondary | ICD-10-CM | POA: Diagnosis not present

## 2014-06-20 DIAGNOSIS — M9903 Segmental and somatic dysfunction of lumbar region: Secondary | ICD-10-CM | POA: Diagnosis not present

## 2014-06-20 DIAGNOSIS — M9904 Segmental and somatic dysfunction of sacral region: Secondary | ICD-10-CM | POA: Diagnosis not present

## 2014-06-30 DIAGNOSIS — M9903 Segmental and somatic dysfunction of lumbar region: Secondary | ICD-10-CM | POA: Diagnosis not present

## 2014-06-30 DIAGNOSIS — M5137 Other intervertebral disc degeneration, lumbosacral region: Secondary | ICD-10-CM | POA: Diagnosis not present

## 2014-06-30 DIAGNOSIS — M9901 Segmental and somatic dysfunction of cervical region: Secondary | ICD-10-CM | POA: Diagnosis not present

## 2014-06-30 DIAGNOSIS — M9904 Segmental and somatic dysfunction of sacral region: Secondary | ICD-10-CM | POA: Diagnosis not present

## 2014-06-30 DIAGNOSIS — M4317 Spondylolisthesis, lumbosacral region: Secondary | ICD-10-CM | POA: Diagnosis not present

## 2014-06-30 DIAGNOSIS — M5415 Radiculopathy, thoracolumbar region: Secondary | ICD-10-CM | POA: Diagnosis not present

## 2014-06-30 DIAGNOSIS — M5412 Radiculopathy, cervical region: Secondary | ICD-10-CM | POA: Diagnosis not present

## 2014-06-30 DIAGNOSIS — M9905 Segmental and somatic dysfunction of pelvic region: Secondary | ICD-10-CM | POA: Diagnosis not present

## 2014-07-08 DIAGNOSIS — E119 Type 2 diabetes mellitus without complications: Secondary | ICD-10-CM | POA: Diagnosis not present

## 2014-07-08 DIAGNOSIS — M545 Low back pain: Secondary | ICD-10-CM | POA: Diagnosis not present

## 2014-07-08 DIAGNOSIS — I10 Essential (primary) hypertension: Secondary | ICD-10-CM | POA: Diagnosis not present

## 2014-07-08 DIAGNOSIS — N3941 Urge incontinence: Secondary | ICD-10-CM | POA: Diagnosis not present

## 2014-07-08 DIAGNOSIS — E1165 Type 2 diabetes mellitus with hyperglycemia: Secondary | ICD-10-CM | POA: Diagnosis not present

## 2014-07-08 DIAGNOSIS — G4733 Obstructive sleep apnea (adult) (pediatric): Secondary | ICD-10-CM | POA: Diagnosis not present

## 2014-07-08 DIAGNOSIS — I4891 Unspecified atrial fibrillation: Secondary | ICD-10-CM | POA: Diagnosis not present

## 2014-07-08 DIAGNOSIS — E034 Atrophy of thyroid (acquired): Secondary | ICD-10-CM | POA: Diagnosis not present

## 2014-07-08 DIAGNOSIS — E780 Pure hypercholesterolemia: Secondary | ICD-10-CM | POA: Diagnosis not present

## 2014-07-13 DIAGNOSIS — E119 Type 2 diabetes mellitus without complications: Secondary | ICD-10-CM | POA: Diagnosis not present

## 2014-07-13 DIAGNOSIS — R600 Localized edema: Secondary | ICD-10-CM | POA: Diagnosis not present

## 2014-07-13 DIAGNOSIS — G4733 Obstructive sleep apnea (adult) (pediatric): Secondary | ICD-10-CM | POA: Diagnosis not present

## 2014-07-13 DIAGNOSIS — R0602 Shortness of breath: Secondary | ICD-10-CM | POA: Diagnosis not present

## 2014-07-13 DIAGNOSIS — I10 Essential (primary) hypertension: Secondary | ICD-10-CM | POA: Diagnosis not present

## 2014-07-13 DIAGNOSIS — I4891 Unspecified atrial fibrillation: Secondary | ICD-10-CM | POA: Diagnosis not present

## 2014-07-13 DIAGNOSIS — E785 Hyperlipidemia, unspecified: Secondary | ICD-10-CM | POA: Diagnosis not present

## 2014-07-21 DIAGNOSIS — J189 Pneumonia, unspecified organism: Secondary | ICD-10-CM | POA: Diagnosis not present

## 2014-07-25 DIAGNOSIS — J441 Chronic obstructive pulmonary disease with (acute) exacerbation: Secondary | ICD-10-CM | POA: Diagnosis not present

## 2014-07-25 DIAGNOSIS — J188 Other pneumonia, unspecified organism: Secondary | ICD-10-CM | POA: Diagnosis not present

## 2014-07-25 DIAGNOSIS — E119 Type 2 diabetes mellitus without complications: Secondary | ICD-10-CM | POA: Diagnosis not present

## 2014-08-01 DIAGNOSIS — R609 Edema, unspecified: Secondary | ICD-10-CM | POA: Diagnosis not present

## 2014-08-01 DIAGNOSIS — J45909 Unspecified asthma, uncomplicated: Secondary | ICD-10-CM | POA: Diagnosis not present

## 2014-08-01 DIAGNOSIS — R600 Localized edema: Secondary | ICD-10-CM | POA: Diagnosis not present

## 2014-08-01 DIAGNOSIS — G4733 Obstructive sleep apnea (adult) (pediatric): Secondary | ICD-10-CM | POA: Diagnosis not present

## 2014-08-25 DIAGNOSIS — K219 Gastro-esophageal reflux disease without esophagitis: Secondary | ICD-10-CM | POA: Diagnosis not present

## 2014-08-25 DIAGNOSIS — R1111 Vomiting without nausea: Secondary | ICD-10-CM | POA: Diagnosis not present

## 2014-09-07 DIAGNOSIS — R0602 Shortness of breath: Secondary | ICD-10-CM | POA: Diagnosis not present

## 2014-09-16 DIAGNOSIS — I4892 Unspecified atrial flutter: Secondary | ICD-10-CM | POA: Diagnosis not present

## 2014-09-16 DIAGNOSIS — G4733 Obstructive sleep apnea (adult) (pediatric): Secondary | ICD-10-CM | POA: Diagnosis not present

## 2014-09-16 DIAGNOSIS — J4540 Moderate persistent asthma, uncomplicated: Secondary | ICD-10-CM | POA: Diagnosis not present

## 2014-10-25 DIAGNOSIS — G4733 Obstructive sleep apnea (adult) (pediatric): Secondary | ICD-10-CM | POA: Diagnosis not present

## 2014-10-25 DIAGNOSIS — E034 Atrophy of thyroid (acquired): Secondary | ICD-10-CM | POA: Diagnosis not present

## 2014-10-25 DIAGNOSIS — E782 Mixed hyperlipidemia: Secondary | ICD-10-CM | POA: Diagnosis not present

## 2014-10-25 DIAGNOSIS — E1165 Type 2 diabetes mellitus with hyperglycemia: Secondary | ICD-10-CM | POA: Diagnosis not present

## 2014-10-25 DIAGNOSIS — I482 Chronic atrial fibrillation: Secondary | ICD-10-CM | POA: Diagnosis not present

## 2014-10-25 DIAGNOSIS — E038 Other specified hypothyroidism: Secondary | ICD-10-CM | POA: Diagnosis not present

## 2014-10-25 DIAGNOSIS — I10 Essential (primary) hypertension: Secondary | ICD-10-CM | POA: Diagnosis not present

## 2014-10-25 DIAGNOSIS — E780 Pure hypercholesterolemia: Secondary | ICD-10-CM | POA: Diagnosis not present

## 2014-10-25 DIAGNOSIS — E119 Type 2 diabetes mellitus without complications: Secondary | ICD-10-CM | POA: Diagnosis not present

## 2014-11-18 DIAGNOSIS — I10 Essential (primary) hypertension: Secondary | ICD-10-CM | POA: Diagnosis not present

## 2014-11-18 DIAGNOSIS — Z79899 Other long term (current) drug therapy: Secondary | ICD-10-CM | POA: Diagnosis not present

## 2014-11-22 DIAGNOSIS — Z1231 Encounter for screening mammogram for malignant neoplasm of breast: Secondary | ICD-10-CM | POA: Diagnosis not present

## 2014-11-29 DIAGNOSIS — Z1211 Encounter for screening for malignant neoplasm of colon: Secondary | ICD-10-CM | POA: Diagnosis not present

## 2014-11-29 DIAGNOSIS — Z01419 Encounter for gynecological examination (general) (routine) without abnormal findings: Secondary | ICD-10-CM | POA: Diagnosis not present

## 2014-11-29 DIAGNOSIS — Z124 Encounter for screening for malignant neoplasm of cervix: Secondary | ICD-10-CM | POA: Diagnosis not present

## 2014-11-29 DIAGNOSIS — E038 Other specified hypothyroidism: Secondary | ICD-10-CM | POA: Diagnosis not present

## 2014-11-29 DIAGNOSIS — Z0000 Encounter for general adult medical examination without abnormal findings: Secondary | ICD-10-CM | POA: Diagnosis not present

## 2014-12-13 DIAGNOSIS — M79605 Pain in left leg: Secondary | ICD-10-CM | POA: Diagnosis not present

## 2014-12-13 DIAGNOSIS — R600 Localized edema: Secondary | ICD-10-CM | POA: Diagnosis not present

## 2014-12-13 DIAGNOSIS — M7989 Other specified soft tissue disorders: Secondary | ICD-10-CM | POA: Diagnosis not present

## 2014-12-13 DIAGNOSIS — J45909 Unspecified asthma, uncomplicated: Secondary | ICD-10-CM | POA: Diagnosis not present

## 2014-12-13 DIAGNOSIS — R0602 Shortness of breath: Secondary | ICD-10-CM | POA: Diagnosis not present

## 2014-12-13 DIAGNOSIS — M79604 Pain in right leg: Secondary | ICD-10-CM | POA: Diagnosis not present

## 2014-12-13 DIAGNOSIS — I509 Heart failure, unspecified: Secondary | ICD-10-CM | POA: Diagnosis not present

## 2014-12-16 DIAGNOSIS — J309 Allergic rhinitis, unspecified: Secondary | ICD-10-CM | POA: Diagnosis not present

## 2014-12-16 DIAGNOSIS — R600 Localized edema: Secondary | ICD-10-CM | POA: Diagnosis not present

## 2014-12-20 DIAGNOSIS — I509 Heart failure, unspecified: Secondary | ICD-10-CM | POA: Diagnosis not present

## 2014-12-20 DIAGNOSIS — E1165 Type 2 diabetes mellitus with hyperglycemia: Secondary | ICD-10-CM | POA: Diagnosis not present

## 2014-12-20 DIAGNOSIS — J441 Chronic obstructive pulmonary disease with (acute) exacerbation: Secondary | ICD-10-CM | POA: Diagnosis not present

## 2014-12-21 DIAGNOSIS — I509 Heart failure, unspecified: Secondary | ICD-10-CM | POA: Diagnosis not present

## 2014-12-21 DIAGNOSIS — E1165 Type 2 diabetes mellitus with hyperglycemia: Secondary | ICD-10-CM | POA: Diagnosis not present

## 2014-12-21 DIAGNOSIS — J441 Chronic obstructive pulmonary disease with (acute) exacerbation: Secondary | ICD-10-CM | POA: Diagnosis not present

## 2014-12-26 DIAGNOSIS — I509 Heart failure, unspecified: Secondary | ICD-10-CM | POA: Diagnosis not present

## 2014-12-26 DIAGNOSIS — E1165 Type 2 diabetes mellitus with hyperglycemia: Secondary | ICD-10-CM | POA: Diagnosis not present

## 2014-12-26 DIAGNOSIS — J441 Chronic obstructive pulmonary disease with (acute) exacerbation: Secondary | ICD-10-CM | POA: Diagnosis not present

## 2014-12-28 DIAGNOSIS — J441 Chronic obstructive pulmonary disease with (acute) exacerbation: Secondary | ICD-10-CM | POA: Diagnosis not present

## 2014-12-28 DIAGNOSIS — E1165 Type 2 diabetes mellitus with hyperglycemia: Secondary | ICD-10-CM | POA: Diagnosis not present

## 2014-12-28 DIAGNOSIS — I509 Heart failure, unspecified: Secondary | ICD-10-CM | POA: Diagnosis not present

## 2014-12-30 DIAGNOSIS — J441 Chronic obstructive pulmonary disease with (acute) exacerbation: Secondary | ICD-10-CM | POA: Diagnosis not present

## 2014-12-30 DIAGNOSIS — E1165 Type 2 diabetes mellitus with hyperglycemia: Secondary | ICD-10-CM | POA: Diagnosis not present

## 2014-12-30 DIAGNOSIS — I509 Heart failure, unspecified: Secondary | ICD-10-CM | POA: Diagnosis not present

## 2015-01-02 DIAGNOSIS — J441 Chronic obstructive pulmonary disease with (acute) exacerbation: Secondary | ICD-10-CM | POA: Diagnosis not present

## 2015-01-02 DIAGNOSIS — I509 Heart failure, unspecified: Secondary | ICD-10-CM | POA: Diagnosis not present

## 2015-01-02 DIAGNOSIS — E1165 Type 2 diabetes mellitus with hyperglycemia: Secondary | ICD-10-CM | POA: Diagnosis not present

## 2015-01-04 DIAGNOSIS — J441 Chronic obstructive pulmonary disease with (acute) exacerbation: Secondary | ICD-10-CM | POA: Diagnosis not present

## 2015-01-04 DIAGNOSIS — E1165 Type 2 diabetes mellitus with hyperglycemia: Secondary | ICD-10-CM | POA: Diagnosis not present

## 2015-01-04 DIAGNOSIS — I509 Heart failure, unspecified: Secondary | ICD-10-CM | POA: Diagnosis not present

## 2015-01-06 DIAGNOSIS — J441 Chronic obstructive pulmonary disease with (acute) exacerbation: Secondary | ICD-10-CM | POA: Diagnosis not present

## 2015-01-06 DIAGNOSIS — E1165 Type 2 diabetes mellitus with hyperglycemia: Secondary | ICD-10-CM | POA: Diagnosis not present

## 2015-01-06 DIAGNOSIS — I509 Heart failure, unspecified: Secondary | ICD-10-CM | POA: Diagnosis not present

## 2015-01-09 DIAGNOSIS — E1165 Type 2 diabetes mellitus with hyperglycemia: Secondary | ICD-10-CM | POA: Diagnosis not present

## 2015-01-09 DIAGNOSIS — I509 Heart failure, unspecified: Secondary | ICD-10-CM | POA: Diagnosis not present

## 2015-01-09 DIAGNOSIS — J441 Chronic obstructive pulmonary disease with (acute) exacerbation: Secondary | ICD-10-CM | POA: Diagnosis not present

## 2015-02-01 DIAGNOSIS — M25512 Pain in left shoulder: Secondary | ICD-10-CM | POA: Diagnosis not present

## 2015-02-01 DIAGNOSIS — E1165 Type 2 diabetes mellitus with hyperglycemia: Secondary | ICD-10-CM | POA: Diagnosis not present

## 2015-02-01 DIAGNOSIS — G4733 Obstructive sleep apnea (adult) (pediatric): Secondary | ICD-10-CM | POA: Diagnosis not present

## 2015-02-01 DIAGNOSIS — R600 Localized edema: Secondary | ICD-10-CM | POA: Diagnosis not present

## 2015-02-01 DIAGNOSIS — E034 Atrophy of thyroid (acquired): Secondary | ICD-10-CM | POA: Diagnosis not present

## 2015-02-01 DIAGNOSIS — I482 Chronic atrial fibrillation: Secondary | ICD-10-CM | POA: Diagnosis not present

## 2015-02-01 DIAGNOSIS — I10 Essential (primary) hypertension: Secondary | ICD-10-CM | POA: Diagnosis not present

## 2015-02-01 DIAGNOSIS — E780 Pure hypercholesterolemia: Secondary | ICD-10-CM | POA: Diagnosis not present

## 2015-02-01 DIAGNOSIS — G2581 Restless legs syndrome: Secondary | ICD-10-CM | POA: Diagnosis not present

## 2015-02-01 DIAGNOSIS — E038 Other specified hypothyroidism: Secondary | ICD-10-CM | POA: Diagnosis not present

## 2015-03-31 DIAGNOSIS — E1169 Type 2 diabetes mellitus with other specified complication: Secondary | ICD-10-CM | POA: Insufficient documentation

## 2015-03-31 DIAGNOSIS — G4733 Obstructive sleep apnea (adult) (pediatric): Secondary | ICD-10-CM | POA: Insufficient documentation

## 2015-04-04 DIAGNOSIS — E782 Mixed hyperlipidemia: Secondary | ICD-10-CM | POA: Diagnosis not present

## 2015-04-04 DIAGNOSIS — Z7901 Long term (current) use of anticoagulants: Secondary | ICD-10-CM | POA: Diagnosis not present

## 2015-04-04 DIAGNOSIS — Z79899 Other long term (current) drug therapy: Secondary | ICD-10-CM | POA: Diagnosis not present

## 2015-04-04 DIAGNOSIS — I4892 Unspecified atrial flutter: Secondary | ICD-10-CM | POA: Diagnosis not present

## 2015-04-04 DIAGNOSIS — R0602 Shortness of breath: Secondary | ICD-10-CM | POA: Diagnosis not present

## 2015-04-04 DIAGNOSIS — I480 Paroxysmal atrial fibrillation: Secondary | ICD-10-CM | POA: Diagnosis not present

## 2015-04-04 DIAGNOSIS — R0609 Other forms of dyspnea: Secondary | ICD-10-CM | POA: Insufficient documentation

## 2015-04-04 DIAGNOSIS — Z131 Encounter for screening for diabetes mellitus: Secondary | ICD-10-CM | POA: Diagnosis not present

## 2015-04-04 DIAGNOSIS — R0600 Dyspnea, unspecified: Secondary | ICD-10-CM | POA: Insufficient documentation

## 2015-04-04 DIAGNOSIS — I10 Essential (primary) hypertension: Secondary | ICD-10-CM | POA: Diagnosis not present

## 2015-04-04 DIAGNOSIS — R05 Cough: Secondary | ICD-10-CM | POA: Diagnosis not present

## 2015-04-04 DIAGNOSIS — G4733 Obstructive sleep apnea (adult) (pediatric): Secondary | ICD-10-CM | POA: Diagnosis not present

## 2015-04-17 DIAGNOSIS — I480 Paroxysmal atrial fibrillation: Secondary | ICD-10-CM | POA: Diagnosis not present

## 2015-04-17 DIAGNOSIS — R0609 Other forms of dyspnea: Secondary | ICD-10-CM | POA: Diagnosis not present

## 2015-05-08 DIAGNOSIS — I483 Typical atrial flutter: Secondary | ICD-10-CM | POA: Diagnosis not present

## 2015-05-08 DIAGNOSIS — I480 Paroxysmal atrial fibrillation: Secondary | ICD-10-CM | POA: Diagnosis not present

## 2015-05-08 DIAGNOSIS — I10 Essential (primary) hypertension: Secondary | ICD-10-CM | POA: Diagnosis not present

## 2015-05-08 DIAGNOSIS — Z79899 Other long term (current) drug therapy: Secondary | ICD-10-CM | POA: Diagnosis not present

## 2015-06-14 DIAGNOSIS — E1165 Type 2 diabetes mellitus with hyperglycemia: Secondary | ICD-10-CM | POA: Diagnosis not present

## 2015-06-14 DIAGNOSIS — B351 Tinea unguium: Secondary | ICD-10-CM | POA: Diagnosis not present

## 2015-06-14 DIAGNOSIS — R600 Localized edema: Secondary | ICD-10-CM | POA: Diagnosis not present

## 2015-06-14 DIAGNOSIS — E782 Mixed hyperlipidemia: Secondary | ICD-10-CM | POA: Diagnosis not present

## 2015-06-14 DIAGNOSIS — G2581 Restless legs syndrome: Secondary | ICD-10-CM | POA: Diagnosis not present

## 2015-06-14 DIAGNOSIS — I482 Chronic atrial fibrillation: Secondary | ICD-10-CM | POA: Diagnosis not present

## 2015-06-14 DIAGNOSIS — I119 Hypertensive heart disease without heart failure: Secondary | ICD-10-CM | POA: Diagnosis not present

## 2015-06-14 DIAGNOSIS — F320 Major depressive disorder, single episode, mild: Secondary | ICD-10-CM | POA: Diagnosis not present

## 2015-06-14 DIAGNOSIS — Z23 Encounter for immunization: Secondary | ICD-10-CM | POA: Diagnosis not present

## 2015-06-14 DIAGNOSIS — G4733 Obstructive sleep apnea (adult) (pediatric): Secondary | ICD-10-CM | POA: Diagnosis not present

## 2015-06-14 DIAGNOSIS — E119 Type 2 diabetes mellitus without complications: Secondary | ICD-10-CM | POA: Diagnosis not present

## 2015-06-14 DIAGNOSIS — E038 Other specified hypothyroidism: Secondary | ICD-10-CM | POA: Diagnosis not present

## 2015-07-06 DIAGNOSIS — I10 Essential (primary) hypertension: Secondary | ICD-10-CM | POA: Diagnosis not present

## 2015-07-06 DIAGNOSIS — E1165 Type 2 diabetes mellitus with hyperglycemia: Secondary | ICD-10-CM | POA: Diagnosis not present

## 2015-08-15 DIAGNOSIS — H2513 Age-related nuclear cataract, bilateral: Secondary | ICD-10-CM | POA: Diagnosis not present

## 2015-08-15 DIAGNOSIS — E119 Type 2 diabetes mellitus without complications: Secondary | ICD-10-CM | POA: Diagnosis not present

## 2015-08-29 DIAGNOSIS — J018 Other acute sinusitis: Secondary | ICD-10-CM | POA: Diagnosis not present

## 2015-09-18 DIAGNOSIS — I10 Essential (primary) hypertension: Secondary | ICD-10-CM | POA: Diagnosis not present

## 2015-09-18 DIAGNOSIS — G2581 Restless legs syndrome: Secondary | ICD-10-CM | POA: Diagnosis not present

## 2015-09-18 DIAGNOSIS — F321 Major depressive disorder, single episode, moderate: Secondary | ICD-10-CM | POA: Diagnosis not present

## 2015-09-18 DIAGNOSIS — I482 Chronic atrial fibrillation: Secondary | ICD-10-CM | POA: Diagnosis not present

## 2015-09-18 DIAGNOSIS — E119 Type 2 diabetes mellitus without complications: Secondary | ICD-10-CM | POA: Diagnosis not present

## 2015-09-18 DIAGNOSIS — E782 Mixed hyperlipidemia: Secondary | ICD-10-CM | POA: Diagnosis not present

## 2015-09-18 DIAGNOSIS — G4733 Obstructive sleep apnea (adult) (pediatric): Secondary | ICD-10-CM | POA: Diagnosis not present

## 2015-09-18 DIAGNOSIS — E1165 Type 2 diabetes mellitus with hyperglycemia: Secondary | ICD-10-CM | POA: Diagnosis not present

## 2015-11-27 DIAGNOSIS — F331 Major depressive disorder, recurrent, moderate: Secondary | ICD-10-CM | POA: Diagnosis not present

## 2015-12-11 DIAGNOSIS — Z803 Family history of malignant neoplasm of breast: Secondary | ICD-10-CM | POA: Diagnosis not present

## 2015-12-11 DIAGNOSIS — Z1231 Encounter for screening mammogram for malignant neoplasm of breast: Secondary | ICD-10-CM | POA: Diagnosis not present

## 2015-12-14 DIAGNOSIS — R922 Inconclusive mammogram: Secondary | ICD-10-CM | POA: Diagnosis not present

## 2015-12-14 DIAGNOSIS — R928 Other abnormal and inconclusive findings on diagnostic imaging of breast: Secondary | ICD-10-CM | POA: Diagnosis not present

## 2015-12-14 DIAGNOSIS — Z803 Family history of malignant neoplasm of breast: Secondary | ICD-10-CM | POA: Diagnosis not present

## 2015-12-18 DIAGNOSIS — I482 Chronic atrial fibrillation: Secondary | ICD-10-CM | POA: Diagnosis not present

## 2015-12-18 DIAGNOSIS — E038 Other specified hypothyroidism: Secondary | ICD-10-CM | POA: Diagnosis not present

## 2015-12-18 DIAGNOSIS — I119 Hypertensive heart disease without heart failure: Secondary | ICD-10-CM | POA: Diagnosis not present

## 2015-12-18 DIAGNOSIS — E782 Mixed hyperlipidemia: Secondary | ICD-10-CM | POA: Diagnosis not present

## 2015-12-18 DIAGNOSIS — F321 Major depressive disorder, single episode, moderate: Secondary | ICD-10-CM | POA: Diagnosis not present

## 2015-12-18 DIAGNOSIS — E119 Type 2 diabetes mellitus without complications: Secondary | ICD-10-CM | POA: Diagnosis not present

## 2015-12-18 DIAGNOSIS — G4733 Obstructive sleep apnea (adult) (pediatric): Secondary | ICD-10-CM | POA: Diagnosis not present

## 2015-12-18 DIAGNOSIS — I110 Hypertensive heart disease with heart failure: Secondary | ICD-10-CM | POA: Diagnosis not present

## 2015-12-18 DIAGNOSIS — E1165 Type 2 diabetes mellitus with hyperglycemia: Secondary | ICD-10-CM | POA: Diagnosis not present

## 2015-12-18 DIAGNOSIS — G2581 Restless legs syndrome: Secondary | ICD-10-CM | POA: Diagnosis not present

## 2016-01-01 DIAGNOSIS — F331 Major depressive disorder, recurrent, moderate: Secondary | ICD-10-CM | POA: Diagnosis not present

## 2016-01-01 DIAGNOSIS — F419 Anxiety disorder, unspecified: Secondary | ICD-10-CM | POA: Diagnosis not present

## 2016-01-24 DIAGNOSIS — Z79899 Other long term (current) drug therapy: Secondary | ICD-10-CM | POA: Diagnosis not present

## 2016-01-24 DIAGNOSIS — I509 Heart failure, unspecified: Secondary | ICD-10-CM | POA: Diagnosis not present

## 2016-01-24 DIAGNOSIS — I482 Chronic atrial fibrillation: Secondary | ICD-10-CM | POA: Diagnosis not present

## 2016-01-24 DIAGNOSIS — F419 Anxiety disorder, unspecified: Secondary | ICD-10-CM | POA: Diagnosis not present

## 2016-01-24 DIAGNOSIS — E119 Type 2 diabetes mellitus without complications: Secondary | ICD-10-CM | POA: Diagnosis not present

## 2016-01-24 DIAGNOSIS — I110 Hypertensive heart disease with heart failure: Secondary | ICD-10-CM | POA: Diagnosis not present

## 2016-01-24 DIAGNOSIS — R079 Chest pain, unspecified: Secondary | ICD-10-CM | POA: Diagnosis not present

## 2016-01-24 DIAGNOSIS — E7800 Pure hypercholesterolemia, unspecified: Secondary | ICD-10-CM | POA: Diagnosis not present

## 2016-01-24 DIAGNOSIS — R002 Palpitations: Secondary | ICD-10-CM | POA: Diagnosis not present

## 2016-02-05 DIAGNOSIS — Z131 Encounter for screening for diabetes mellitus: Secondary | ICD-10-CM | POA: Diagnosis not present

## 2016-02-05 DIAGNOSIS — G4733 Obstructive sleep apnea (adult) (pediatric): Secondary | ICD-10-CM | POA: Diagnosis not present

## 2016-02-05 DIAGNOSIS — I10 Essential (primary) hypertension: Secondary | ICD-10-CM | POA: Diagnosis not present

## 2016-02-05 DIAGNOSIS — E782 Mixed hyperlipidemia: Secondary | ICD-10-CM | POA: Diagnosis not present

## 2016-02-05 DIAGNOSIS — I480 Paroxysmal atrial fibrillation: Secondary | ICD-10-CM | POA: Diagnosis not present

## 2016-02-05 DIAGNOSIS — I483 Typical atrial flutter: Secondary | ICD-10-CM | POA: Diagnosis not present

## 2016-02-05 DIAGNOSIS — E669 Obesity, unspecified: Secondary | ICD-10-CM | POA: Diagnosis not present

## 2016-02-08 DIAGNOSIS — I4892 Unspecified atrial flutter: Secondary | ICD-10-CM | POA: Diagnosis not present

## 2016-02-12 DIAGNOSIS — Z7901 Long term (current) use of anticoagulants: Secondary | ICD-10-CM | POA: Diagnosis not present

## 2016-02-12 DIAGNOSIS — Z8679 Personal history of other diseases of the circulatory system: Secondary | ICD-10-CM | POA: Insufficient documentation

## 2016-02-12 DIAGNOSIS — I4892 Unspecified atrial flutter: Secondary | ICD-10-CM | POA: Diagnosis not present

## 2016-02-12 DIAGNOSIS — Z9889 Other specified postprocedural states: Secondary | ICD-10-CM | POA: Diagnosis not present

## 2016-02-12 DIAGNOSIS — I480 Paroxysmal atrial fibrillation: Secondary | ICD-10-CM | POA: Diagnosis not present

## 2016-02-12 DIAGNOSIS — I10 Essential (primary) hypertension: Secondary | ICD-10-CM | POA: Diagnosis not present

## 2016-02-12 HISTORY — DX: Personal history of other diseases of the circulatory system: Z86.79

## 2016-02-12 HISTORY — DX: Other specified postprocedural states: Z98.890

## 2016-02-16 DIAGNOSIS — I252 Old myocardial infarction: Secondary | ICD-10-CM | POA: Diagnosis not present

## 2016-02-16 DIAGNOSIS — I4892 Unspecified atrial flutter: Secondary | ICD-10-CM | POA: Diagnosis not present

## 2016-02-16 DIAGNOSIS — E119 Type 2 diabetes mellitus without complications: Secondary | ICD-10-CM | POA: Diagnosis not present

## 2016-02-16 DIAGNOSIS — E669 Obesity, unspecified: Secondary | ICD-10-CM | POA: Diagnosis not present

## 2016-02-16 DIAGNOSIS — I10 Essential (primary) hypertension: Secondary | ICD-10-CM | POA: Diagnosis not present

## 2016-02-16 DIAGNOSIS — Z79899 Other long term (current) drug therapy: Secondary | ICD-10-CM | POA: Diagnosis not present

## 2016-02-16 DIAGNOSIS — I4891 Unspecified atrial fibrillation: Secondary | ICD-10-CM | POA: Diagnosis not present

## 2016-02-16 DIAGNOSIS — G4730 Sleep apnea, unspecified: Secondary | ICD-10-CM | POA: Diagnosis not present

## 2016-02-16 DIAGNOSIS — E0590 Thyrotoxicosis, unspecified without thyrotoxic crisis or storm: Secondary | ICD-10-CM | POA: Diagnosis not present

## 2016-03-20 DIAGNOSIS — N390 Urinary tract infection, site not specified: Secondary | ICD-10-CM | POA: Diagnosis not present

## 2016-03-20 DIAGNOSIS — E039 Hypothyroidism, unspecified: Secondary | ICD-10-CM | POA: Diagnosis not present

## 2016-03-20 DIAGNOSIS — Z6835 Body mass index (BMI) 35.0-35.9, adult: Secondary | ICD-10-CM | POA: Diagnosis not present

## 2016-03-20 DIAGNOSIS — I10 Essential (primary) hypertension: Secondary | ICD-10-CM | POA: Diagnosis not present

## 2016-03-20 DIAGNOSIS — R0789 Other chest pain: Secondary | ICD-10-CM | POA: Diagnosis not present

## 2016-03-20 DIAGNOSIS — Z9882 Breast implant status: Secondary | ICD-10-CM | POA: Diagnosis not present

## 2016-03-20 DIAGNOSIS — R079 Chest pain, unspecified: Secondary | ICD-10-CM | POA: Diagnosis not present

## 2016-03-20 DIAGNOSIS — R42 Dizziness and giddiness: Secondary | ICD-10-CM | POA: Diagnosis not present

## 2016-03-20 DIAGNOSIS — Z8249 Family history of ischemic heart disease and other diseases of the circulatory system: Secondary | ICD-10-CM | POA: Diagnosis not present

## 2016-03-20 DIAGNOSIS — R0602 Shortness of breath: Secondary | ICD-10-CM | POA: Diagnosis not present

## 2016-03-20 DIAGNOSIS — Z780 Asymptomatic menopausal state: Secondary | ICD-10-CM | POA: Diagnosis not present

## 2016-03-20 DIAGNOSIS — I482 Chronic atrial fibrillation: Secondary | ICD-10-CM | POA: Diagnosis not present

## 2016-03-20 DIAGNOSIS — F419 Anxiety disorder, unspecified: Secondary | ICD-10-CM | POA: Diagnosis not present

## 2016-03-20 DIAGNOSIS — Z885 Allergy status to narcotic agent status: Secondary | ICD-10-CM | POA: Diagnosis not present

## 2016-03-20 DIAGNOSIS — Z9289 Personal history of other medical treatment: Secondary | ICD-10-CM | POA: Diagnosis not present

## 2016-03-20 DIAGNOSIS — Z7901 Long term (current) use of anticoagulants: Secondary | ICD-10-CM | POA: Diagnosis not present

## 2016-03-20 DIAGNOSIS — E669 Obesity, unspecified: Secondary | ICD-10-CM | POA: Diagnosis not present

## 2016-03-20 DIAGNOSIS — R009 Unspecified abnormalities of heart beat: Secondary | ICD-10-CM | POA: Diagnosis not present

## 2016-03-20 DIAGNOSIS — E119 Type 2 diabetes mellitus without complications: Secondary | ICD-10-CM | POA: Diagnosis not present

## 2016-03-20 DIAGNOSIS — I484 Atypical atrial flutter: Secondary | ICD-10-CM | POA: Diagnosis not present

## 2016-03-20 DIAGNOSIS — E785 Hyperlipidemia, unspecified: Secondary | ICD-10-CM | POA: Diagnosis not present

## 2016-03-20 DIAGNOSIS — I4892 Unspecified atrial flutter: Secondary | ICD-10-CM | POA: Diagnosis not present

## 2016-03-22 DIAGNOSIS — Z9889 Other specified postprocedural states: Secondary | ICD-10-CM | POA: Diagnosis not present

## 2016-03-22 DIAGNOSIS — Z7901 Long term (current) use of anticoagulants: Secondary | ICD-10-CM | POA: Diagnosis not present

## 2016-03-22 DIAGNOSIS — I10 Essential (primary) hypertension: Secondary | ICD-10-CM | POA: Diagnosis not present

## 2016-03-22 DIAGNOSIS — I4891 Unspecified atrial fibrillation: Secondary | ICD-10-CM | POA: Diagnosis not present

## 2016-03-22 DIAGNOSIS — I4892 Unspecified atrial flutter: Secondary | ICD-10-CM | POA: Diagnosis not present

## 2016-03-22 DIAGNOSIS — Z8679 Personal history of other diseases of the circulatory system: Secondary | ICD-10-CM | POA: Diagnosis not present

## 2016-03-27 DIAGNOSIS — I10 Essential (primary) hypertension: Secondary | ICD-10-CM | POA: Diagnosis not present

## 2016-03-27 DIAGNOSIS — E782 Mixed hyperlipidemia: Secondary | ICD-10-CM | POA: Diagnosis not present

## 2016-03-27 DIAGNOSIS — Z8679 Personal history of other diseases of the circulatory system: Secondary | ICD-10-CM | POA: Diagnosis not present

## 2016-03-27 DIAGNOSIS — I4891 Unspecified atrial fibrillation: Secondary | ICD-10-CM | POA: Diagnosis not present

## 2016-03-27 DIAGNOSIS — Z9889 Other specified postprocedural states: Secondary | ICD-10-CM | POA: Diagnosis not present

## 2016-03-27 DIAGNOSIS — I4892 Unspecified atrial flutter: Secondary | ICD-10-CM | POA: Diagnosis not present

## 2016-03-27 DIAGNOSIS — G4733 Obstructive sleep apnea (adult) (pediatric): Secondary | ICD-10-CM | POA: Diagnosis not present

## 2016-04-03 DIAGNOSIS — Z9889 Other specified postprocedural states: Secondary | ICD-10-CM | POA: Diagnosis not present

## 2016-04-03 DIAGNOSIS — I4891 Unspecified atrial fibrillation: Secondary | ICD-10-CM | POA: Diagnosis not present

## 2016-04-03 DIAGNOSIS — I4892 Unspecified atrial flutter: Secondary | ICD-10-CM | POA: Diagnosis not present

## 2016-04-03 DIAGNOSIS — I10 Essential (primary) hypertension: Secondary | ICD-10-CM | POA: Diagnosis not present

## 2016-04-03 DIAGNOSIS — G4733 Obstructive sleep apnea (adult) (pediatric): Secondary | ICD-10-CM | POA: Diagnosis not present

## 2016-04-03 DIAGNOSIS — Z8679 Personal history of other diseases of the circulatory system: Secondary | ICD-10-CM | POA: Diagnosis not present

## 2016-04-10 DIAGNOSIS — Z79899 Other long term (current) drug therapy: Secondary | ICD-10-CM | POA: Diagnosis not present

## 2016-04-10 DIAGNOSIS — R0602 Shortness of breath: Secondary | ICD-10-CM | POA: Diagnosis not present

## 2016-04-10 DIAGNOSIS — I483 Typical atrial flutter: Secondary | ICD-10-CM | POA: Diagnosis not present

## 2016-04-10 DIAGNOSIS — I252 Old myocardial infarction: Secondary | ICD-10-CM | POA: Diagnosis not present

## 2016-04-10 DIAGNOSIS — J45909 Unspecified asthma, uncomplicated: Secondary | ICD-10-CM | POA: Diagnosis not present

## 2016-04-10 DIAGNOSIS — I10 Essential (primary) hypertension: Secondary | ICD-10-CM | POA: Diagnosis not present

## 2016-04-10 DIAGNOSIS — E1165 Type 2 diabetes mellitus with hyperglycemia: Secondary | ICD-10-CM | POA: Diagnosis not present

## 2016-04-10 DIAGNOSIS — G4730 Sleep apnea, unspecified: Secondary | ICD-10-CM | POA: Diagnosis not present

## 2016-04-10 DIAGNOSIS — I4892 Unspecified atrial flutter: Secondary | ICD-10-CM | POA: Diagnosis not present

## 2016-04-26 DIAGNOSIS — R110 Nausea: Secondary | ICD-10-CM | POA: Diagnosis not present

## 2016-04-26 DIAGNOSIS — E119 Type 2 diabetes mellitus without complications: Secondary | ICD-10-CM | POA: Diagnosis not present

## 2016-04-26 DIAGNOSIS — S8991XA Unspecified injury of right lower leg, initial encounter: Secondary | ICD-10-CM | POA: Diagnosis not present

## 2016-04-26 DIAGNOSIS — Z780 Asymptomatic menopausal state: Secondary | ICD-10-CM | POA: Diagnosis not present

## 2016-04-26 DIAGNOSIS — R51 Headache: Secondary | ICD-10-CM | POA: Diagnosis not present

## 2016-04-26 DIAGNOSIS — S8992XA Unspecified injury of left lower leg, initial encounter: Secondary | ICD-10-CM | POA: Diagnosis not present

## 2016-04-26 DIAGNOSIS — G4730 Sleep apnea, unspecified: Secondary | ICD-10-CM | POA: Diagnosis not present

## 2016-04-26 DIAGNOSIS — S8001XA Contusion of right knee, initial encounter: Secondary | ICD-10-CM | POA: Diagnosis not present

## 2016-04-26 DIAGNOSIS — Z885 Allergy status to narcotic agent status: Secondary | ICD-10-CM | POA: Diagnosis not present

## 2016-04-26 DIAGNOSIS — S0990XA Unspecified injury of head, initial encounter: Secondary | ICD-10-CM | POA: Diagnosis not present

## 2016-04-26 DIAGNOSIS — S8002XA Contusion of left knee, initial encounter: Secondary | ICD-10-CM | POA: Diagnosis not present

## 2016-05-01 DIAGNOSIS — E784 Other hyperlipidemia: Secondary | ICD-10-CM | POA: Diagnosis not present

## 2016-05-01 DIAGNOSIS — I482 Chronic atrial fibrillation: Secondary | ICD-10-CM | POA: Diagnosis not present

## 2016-05-01 DIAGNOSIS — R112 Nausea with vomiting, unspecified: Secondary | ICD-10-CM | POA: Diagnosis not present

## 2016-05-01 DIAGNOSIS — E119 Type 2 diabetes mellitus without complications: Secondary | ICD-10-CM | POA: Diagnosis not present

## 2016-05-01 DIAGNOSIS — R5383 Other fatigue: Secondary | ICD-10-CM | POA: Diagnosis not present

## 2016-05-15 DIAGNOSIS — K219 Gastro-esophageal reflux disease without esophagitis: Secondary | ICD-10-CM | POA: Diagnosis not present

## 2016-05-15 DIAGNOSIS — E784 Other hyperlipidemia: Secondary | ICD-10-CM | POA: Diagnosis not present

## 2016-05-15 DIAGNOSIS — E1165 Type 2 diabetes mellitus with hyperglycemia: Secondary | ICD-10-CM | POA: Diagnosis not present

## 2016-05-15 DIAGNOSIS — I482 Chronic atrial fibrillation: Secondary | ICD-10-CM | POA: Diagnosis not present

## 2016-05-15 DIAGNOSIS — Z23 Encounter for immunization: Secondary | ICD-10-CM | POA: Diagnosis not present

## 2016-05-17 DIAGNOSIS — I4891 Unspecified atrial fibrillation: Secondary | ICD-10-CM | POA: Diagnosis not present

## 2016-05-17 DIAGNOSIS — I4892 Unspecified atrial flutter: Secondary | ICD-10-CM | POA: Diagnosis not present

## 2016-05-17 DIAGNOSIS — I517 Cardiomegaly: Secondary | ICD-10-CM | POA: Diagnosis not present

## 2016-05-20 DIAGNOSIS — I4891 Unspecified atrial fibrillation: Secondary | ICD-10-CM | POA: Diagnosis not present

## 2016-05-20 DIAGNOSIS — J9811 Atelectasis: Secondary | ICD-10-CM | POA: Diagnosis not present

## 2016-05-20 DIAGNOSIS — F419 Anxiety disorder, unspecified: Secondary | ICD-10-CM | POA: Diagnosis not present

## 2016-05-20 DIAGNOSIS — E119 Type 2 diabetes mellitus without complications: Secondary | ICD-10-CM | POA: Diagnosis not present

## 2016-05-20 DIAGNOSIS — I484 Atypical atrial flutter: Secondary | ICD-10-CM | POA: Diagnosis not present

## 2016-05-20 DIAGNOSIS — Z885 Allergy status to narcotic agent status: Secondary | ICD-10-CM | POA: Diagnosis not present

## 2016-05-20 DIAGNOSIS — Z9882 Breast implant status: Secondary | ICD-10-CM | POA: Diagnosis not present

## 2016-05-20 DIAGNOSIS — Z79899 Other long term (current) drug therapy: Secondary | ICD-10-CM | POA: Diagnosis not present

## 2016-05-20 DIAGNOSIS — I471 Supraventricular tachycardia: Secondary | ICD-10-CM | POA: Diagnosis not present

## 2016-05-20 DIAGNOSIS — Z7901 Long term (current) use of anticoagulants: Secondary | ICD-10-CM | POA: Diagnosis not present

## 2016-05-20 DIAGNOSIS — I481 Persistent atrial fibrillation: Secondary | ICD-10-CM | POA: Diagnosis not present

## 2016-05-20 DIAGNOSIS — E785 Hyperlipidemia, unspecified: Secondary | ICD-10-CM | POA: Diagnosis not present

## 2016-05-21 DIAGNOSIS — I4892 Unspecified atrial flutter: Secondary | ICD-10-CM | POA: Diagnosis not present

## 2016-05-21 DIAGNOSIS — Z79899 Other long term (current) drug therapy: Secondary | ICD-10-CM | POA: Diagnosis not present

## 2016-05-21 DIAGNOSIS — I471 Supraventricular tachycardia: Secondary | ICD-10-CM | POA: Diagnosis not present

## 2016-05-21 DIAGNOSIS — I4891 Unspecified atrial fibrillation: Secondary | ICD-10-CM | POA: Diagnosis not present

## 2016-05-21 DIAGNOSIS — I484 Atypical atrial flutter: Secondary | ICD-10-CM | POA: Diagnosis not present

## 2016-05-21 DIAGNOSIS — J9811 Atelectasis: Secondary | ICD-10-CM | POA: Diagnosis not present

## 2016-05-21 DIAGNOSIS — E119 Type 2 diabetes mellitus without complications: Secondary | ICD-10-CM | POA: Diagnosis not present

## 2016-05-21 DIAGNOSIS — Z7901 Long term (current) use of anticoagulants: Secondary | ICD-10-CM | POA: Diagnosis not present

## 2016-06-18 DIAGNOSIS — S01512A Laceration without foreign body of oral cavity, initial encounter: Secondary | ICD-10-CM | POA: Diagnosis not present

## 2016-06-19 DIAGNOSIS — Z9889 Other specified postprocedural states: Secondary | ICD-10-CM | POA: Diagnosis not present

## 2016-06-19 DIAGNOSIS — I4892 Unspecified atrial flutter: Secondary | ICD-10-CM | POA: Diagnosis not present

## 2016-06-19 DIAGNOSIS — I10 Essential (primary) hypertension: Secondary | ICD-10-CM | POA: Diagnosis not present

## 2016-06-19 DIAGNOSIS — Z8679 Personal history of other diseases of the circulatory system: Secondary | ICD-10-CM | POA: Diagnosis not present

## 2016-06-19 DIAGNOSIS — G4733 Obstructive sleep apnea (adult) (pediatric): Secondary | ICD-10-CM | POA: Diagnosis not present

## 2016-06-19 DIAGNOSIS — I4891 Unspecified atrial fibrillation: Secondary | ICD-10-CM | POA: Diagnosis not present

## 2016-08-05 DIAGNOSIS — E034 Atrophy of thyroid (acquired): Secondary | ICD-10-CM | POA: Diagnosis not present

## 2016-08-05 DIAGNOSIS — E038 Other specified hypothyroidism: Secondary | ICD-10-CM | POA: Diagnosis not present

## 2016-08-05 DIAGNOSIS — F321 Major depressive disorder, single episode, moderate: Secondary | ICD-10-CM | POA: Diagnosis not present

## 2016-08-05 DIAGNOSIS — I110 Hypertensive heart disease with heart failure: Secondary | ICD-10-CM | POA: Diagnosis not present

## 2016-08-05 DIAGNOSIS — E1165 Type 2 diabetes mellitus with hyperglycemia: Secondary | ICD-10-CM | POA: Diagnosis not present

## 2016-08-05 DIAGNOSIS — G2581 Restless legs syndrome: Secondary | ICD-10-CM | POA: Diagnosis not present

## 2016-08-05 DIAGNOSIS — R358 Other polyuria: Secondary | ICD-10-CM | POA: Diagnosis not present

## 2016-08-05 DIAGNOSIS — E782 Mixed hyperlipidemia: Secondary | ICD-10-CM | POA: Diagnosis not present

## 2016-08-05 DIAGNOSIS — G4733 Obstructive sleep apnea (adult) (pediatric): Secondary | ICD-10-CM | POA: Diagnosis not present

## 2016-08-05 DIAGNOSIS — I482 Chronic atrial fibrillation: Secondary | ICD-10-CM | POA: Diagnosis not present

## 2016-08-08 DIAGNOSIS — E860 Dehydration: Secondary | ICD-10-CM | POA: Diagnosis not present

## 2016-08-08 DIAGNOSIS — A084 Viral intestinal infection, unspecified: Secondary | ICD-10-CM | POA: Diagnosis not present

## 2016-08-08 DIAGNOSIS — R112 Nausea with vomiting, unspecified: Secondary | ICD-10-CM | POA: Diagnosis not present

## 2016-08-08 DIAGNOSIS — R1084 Generalized abdominal pain: Secondary | ICD-10-CM | POA: Diagnosis not present

## 2016-08-08 DIAGNOSIS — K760 Fatty (change of) liver, not elsewhere classified: Secondary | ICD-10-CM | POA: Diagnosis not present

## 2016-08-08 DIAGNOSIS — E1165 Type 2 diabetes mellitus with hyperglycemia: Secondary | ICD-10-CM | POA: Diagnosis not present

## 2016-08-08 DIAGNOSIS — I4891 Unspecified atrial fibrillation: Secondary | ICD-10-CM | POA: Diagnosis not present

## 2016-08-08 DIAGNOSIS — E039 Hypothyroidism, unspecified: Secondary | ICD-10-CM | POA: Diagnosis not present

## 2016-08-08 DIAGNOSIS — K2970 Gastritis, unspecified, without bleeding: Secondary | ICD-10-CM | POA: Diagnosis not present

## 2016-08-08 DIAGNOSIS — R531 Weakness: Secondary | ICD-10-CM | POA: Diagnosis not present

## 2016-08-08 DIAGNOSIS — R109 Unspecified abdominal pain: Secondary | ICD-10-CM | POA: Diagnosis not present

## 2016-08-08 DIAGNOSIS — K529 Noninfective gastroenteritis and colitis, unspecified: Secondary | ICD-10-CM | POA: Diagnosis not present

## 2016-08-08 DIAGNOSIS — N179 Acute kidney failure, unspecified: Secondary | ICD-10-CM | POA: Diagnosis not present

## 2016-08-09 DIAGNOSIS — F419 Anxiety disorder, unspecified: Secondary | ICD-10-CM | POA: Diagnosis present

## 2016-08-09 DIAGNOSIS — D72829 Elevated white blood cell count, unspecified: Secondary | ICD-10-CM | POA: Diagnosis not present

## 2016-08-09 DIAGNOSIS — E1165 Type 2 diabetes mellitus with hyperglycemia: Secondary | ICD-10-CM | POA: Insufficient documentation

## 2016-08-09 DIAGNOSIS — E860 Dehydration: Secondary | ICD-10-CM | POA: Diagnosis present

## 2016-08-09 DIAGNOSIS — J45909 Unspecified asthma, uncomplicated: Secondary | ICD-10-CM | POA: Diagnosis present

## 2016-08-09 DIAGNOSIS — Z794 Long term (current) use of insulin: Secondary | ICD-10-CM | POA: Insufficient documentation

## 2016-08-09 DIAGNOSIS — Z79899 Other long term (current) drug therapy: Secondary | ICD-10-CM | POA: Diagnosis not present

## 2016-08-09 DIAGNOSIS — R109 Unspecified abdominal pain: Secondary | ICD-10-CM | POA: Diagnosis not present

## 2016-08-09 DIAGNOSIS — A084 Viral intestinal infection, unspecified: Secondary | ICD-10-CM | POA: Diagnosis present

## 2016-08-09 DIAGNOSIS — R197 Diarrhea, unspecified: Secondary | ICD-10-CM | POA: Diagnosis not present

## 2016-08-09 DIAGNOSIS — Z7901 Long term (current) use of anticoagulants: Secondary | ICD-10-CM | POA: Diagnosis not present

## 2016-08-09 DIAGNOSIS — E039 Hypothyroidism, unspecified: Secondary | ICD-10-CM | POA: Diagnosis present

## 2016-08-09 DIAGNOSIS — R1084 Generalized abdominal pain: Secondary | ICD-10-CM | POA: Diagnosis not present

## 2016-08-09 DIAGNOSIS — R112 Nausea with vomiting, unspecified: Secondary | ICD-10-CM | POA: Diagnosis not present

## 2016-08-09 DIAGNOSIS — F329 Major depressive disorder, single episode, unspecified: Secondary | ICD-10-CM | POA: Diagnosis present

## 2016-08-09 DIAGNOSIS — E785 Hyperlipidemia, unspecified: Secondary | ICD-10-CM | POA: Diagnosis present

## 2016-08-09 DIAGNOSIS — N179 Acute kidney failure, unspecified: Secondary | ICD-10-CM | POA: Diagnosis present

## 2016-08-09 DIAGNOSIS — R6511 Systemic inflammatory response syndrome (SIRS) of non-infectious origin with acute organ dysfunction: Secondary | ICD-10-CM | POA: Diagnosis not present

## 2016-08-09 DIAGNOSIS — I10 Essential (primary) hypertension: Secondary | ICD-10-CM | POA: Diagnosis present

## 2016-08-09 DIAGNOSIS — I4891 Unspecified atrial fibrillation: Secondary | ICD-10-CM | POA: Diagnosis present

## 2016-08-28 DIAGNOSIS — E1165 Type 2 diabetes mellitus with hyperglycemia: Secondary | ICD-10-CM | POA: Diagnosis not present

## 2016-08-28 DIAGNOSIS — I480 Paroxysmal atrial fibrillation: Secondary | ICD-10-CM | POA: Diagnosis not present

## 2016-08-28 DIAGNOSIS — Z9889 Other specified postprocedural states: Secondary | ICD-10-CM | POA: Diagnosis not present

## 2016-08-28 DIAGNOSIS — Z8679 Personal history of other diseases of the circulatory system: Secondary | ICD-10-CM | POA: Diagnosis not present

## 2016-08-28 DIAGNOSIS — I10 Essential (primary) hypertension: Secondary | ICD-10-CM | POA: Diagnosis not present

## 2016-08-28 DIAGNOSIS — I4892 Unspecified atrial flutter: Secondary | ICD-10-CM | POA: Diagnosis not present

## 2016-08-28 DIAGNOSIS — Z131 Encounter for screening for diabetes mellitus: Secondary | ICD-10-CM | POA: Diagnosis not present

## 2016-08-28 DIAGNOSIS — G4733 Obstructive sleep apnea (adult) (pediatric): Secondary | ICD-10-CM | POA: Diagnosis not present

## 2016-08-28 DIAGNOSIS — Z79899 Other long term (current) drug therapy: Secondary | ICD-10-CM | POA: Diagnosis not present

## 2016-09-09 DIAGNOSIS — R1111 Vomiting without nausea: Secondary | ICD-10-CM | POA: Diagnosis not present

## 2016-09-09 DIAGNOSIS — E1165 Type 2 diabetes mellitus with hyperglycemia: Secondary | ICD-10-CM | POA: Diagnosis not present

## 2016-09-09 DIAGNOSIS — A088 Other specified intestinal infections: Secondary | ICD-10-CM | POA: Diagnosis not present

## 2016-09-09 DIAGNOSIS — I110 Hypertensive heart disease with heart failure: Secondary | ICD-10-CM | POA: Diagnosis not present

## 2016-12-02 DIAGNOSIS — N3000 Acute cystitis without hematuria: Secondary | ICD-10-CM | POA: Diagnosis not present

## 2016-12-02 DIAGNOSIS — E1165 Type 2 diabetes mellitus with hyperglycemia: Secondary | ICD-10-CM | POA: Diagnosis not present

## 2016-12-02 DIAGNOSIS — I482 Chronic atrial fibrillation: Secondary | ICD-10-CM | POA: Diagnosis not present

## 2016-12-02 DIAGNOSIS — R42 Dizziness and giddiness: Secondary | ICD-10-CM | POA: Diagnosis not present

## 2016-12-02 DIAGNOSIS — R443 Hallucinations, unspecified: Secondary | ICD-10-CM | POA: Diagnosis not present

## 2016-12-02 DIAGNOSIS — R4182 Altered mental status, unspecified: Secondary | ICD-10-CM | POA: Diagnosis not present

## 2016-12-02 DIAGNOSIS — G2581 Restless legs syndrome: Secondary | ICD-10-CM | POA: Diagnosis not present

## 2016-12-02 DIAGNOSIS — R441 Visual hallucinations: Secondary | ICD-10-CM | POA: Diagnosis not present

## 2016-12-02 DIAGNOSIS — I110 Hypertensive heart disease with heart failure: Secondary | ICD-10-CM | POA: Diagnosis not present

## 2016-12-02 DIAGNOSIS — Z6837 Body mass index (BMI) 37.0-37.9, adult: Secondary | ICD-10-CM | POA: Diagnosis not present

## 2016-12-02 DIAGNOSIS — E782 Mixed hyperlipidemia: Secondary | ICD-10-CM | POA: Diagnosis not present

## 2016-12-04 DIAGNOSIS — Z8679 Personal history of other diseases of the circulatory system: Secondary | ICD-10-CM | POA: Diagnosis not present

## 2016-12-04 DIAGNOSIS — I4892 Unspecified atrial flutter: Secondary | ICD-10-CM | POA: Diagnosis not present

## 2016-12-04 DIAGNOSIS — I481 Persistent atrial fibrillation: Secondary | ICD-10-CM | POA: Diagnosis not present

## 2016-12-04 DIAGNOSIS — E1165 Type 2 diabetes mellitus with hyperglycemia: Secondary | ICD-10-CM | POA: Diagnosis not present

## 2016-12-04 DIAGNOSIS — Z9889 Other specified postprocedural states: Secondary | ICD-10-CM | POA: Diagnosis not present

## 2016-12-04 DIAGNOSIS — I10 Essential (primary) hypertension: Secondary | ICD-10-CM | POA: Diagnosis not present

## 2016-12-18 DIAGNOSIS — Z794 Long term (current) use of insulin: Secondary | ICD-10-CM | POA: Diagnosis not present

## 2016-12-18 DIAGNOSIS — E1165 Type 2 diabetes mellitus with hyperglycemia: Secondary | ICD-10-CM | POA: Diagnosis not present

## 2016-12-18 DIAGNOSIS — R351 Nocturia: Secondary | ICD-10-CM | POA: Diagnosis not present

## 2017-01-15 DIAGNOSIS — E559 Vitamin D deficiency, unspecified: Secondary | ICD-10-CM | POA: Insufficient documentation

## 2017-01-15 DIAGNOSIS — E039 Hypothyroidism, unspecified: Secondary | ICD-10-CM | POA: Insufficient documentation

## 2017-01-20 DIAGNOSIS — E1165 Type 2 diabetes mellitus with hyperglycemia: Secondary | ICD-10-CM | POA: Diagnosis not present

## 2017-01-20 DIAGNOSIS — E039 Hypothyroidism, unspecified: Secondary | ICD-10-CM | POA: Diagnosis not present

## 2017-01-20 DIAGNOSIS — E559 Vitamin D deficiency, unspecified: Secondary | ICD-10-CM | POA: Diagnosis not present

## 2017-01-20 DIAGNOSIS — Z794 Long term (current) use of insulin: Secondary | ICD-10-CM | POA: Diagnosis not present

## 2017-01-23 DIAGNOSIS — M2550 Pain in unspecified joint: Secondary | ICD-10-CM | POA: Diagnosis not present

## 2017-01-23 DIAGNOSIS — E1165 Type 2 diabetes mellitus with hyperglycemia: Secondary | ICD-10-CM | POA: Diagnosis not present

## 2017-01-23 DIAGNOSIS — I10 Essential (primary) hypertension: Secondary | ICD-10-CM | POA: Diagnosis not present

## 2017-01-23 DIAGNOSIS — E559 Vitamin D deficiency, unspecified: Secondary | ICD-10-CM | POA: Diagnosis not present

## 2017-01-23 DIAGNOSIS — E785 Hyperlipidemia, unspecified: Secondary | ICD-10-CM | POA: Diagnosis not present

## 2017-01-23 DIAGNOSIS — E039 Hypothyroidism, unspecified: Secondary | ICD-10-CM | POA: Diagnosis not present

## 2017-01-23 DIAGNOSIS — E1169 Type 2 diabetes mellitus with other specified complication: Secondary | ICD-10-CM | POA: Diagnosis not present

## 2017-01-23 DIAGNOSIS — G4733 Obstructive sleep apnea (adult) (pediatric): Secondary | ICD-10-CM | POA: Diagnosis not present

## 2017-01-24 DIAGNOSIS — E1165 Type 2 diabetes mellitus with hyperglycemia: Secondary | ICD-10-CM | POA: Diagnosis not present

## 2017-02-03 DIAGNOSIS — Z1231 Encounter for screening mammogram for malignant neoplasm of breast: Secondary | ICD-10-CM | POA: Diagnosis not present

## 2017-03-17 DIAGNOSIS — I482 Chronic atrial fibrillation: Secondary | ICD-10-CM | POA: Diagnosis not present

## 2017-03-17 DIAGNOSIS — G2581 Restless legs syndrome: Secondary | ICD-10-CM | POA: Diagnosis not present

## 2017-03-17 DIAGNOSIS — G4733 Obstructive sleep apnea (adult) (pediatric): Secondary | ICD-10-CM | POA: Diagnosis not present

## 2017-03-17 DIAGNOSIS — E782 Mixed hyperlipidemia: Secondary | ICD-10-CM | POA: Diagnosis not present

## 2017-03-17 DIAGNOSIS — E038 Other specified hypothyroidism: Secondary | ICD-10-CM | POA: Diagnosis not present

## 2017-03-17 DIAGNOSIS — E1142 Type 2 diabetes mellitus with diabetic polyneuropathy: Secondary | ICD-10-CM | POA: Diagnosis not present

## 2017-03-17 DIAGNOSIS — I10 Essential (primary) hypertension: Secondary | ICD-10-CM | POA: Diagnosis not present

## 2017-03-26 DIAGNOSIS — M5417 Radiculopathy, lumbosacral region: Secondary | ICD-10-CM | POA: Diagnosis not present

## 2017-04-02 DIAGNOSIS — E1142 Type 2 diabetes mellitus with diabetic polyneuropathy: Secondary | ICD-10-CM | POA: Insufficient documentation

## 2017-04-02 DIAGNOSIS — G4733 Obstructive sleep apnea (adult) (pediatric): Secondary | ICD-10-CM | POA: Diagnosis not present

## 2017-04-02 DIAGNOSIS — E1165 Type 2 diabetes mellitus with hyperglycemia: Secondary | ICD-10-CM | POA: Diagnosis not present

## 2017-04-02 DIAGNOSIS — E039 Hypothyroidism, unspecified: Secondary | ICD-10-CM | POA: Diagnosis not present

## 2017-04-02 DIAGNOSIS — I10 Essential (primary) hypertension: Secondary | ICD-10-CM | POA: Diagnosis not present

## 2017-04-02 DIAGNOSIS — E785 Hyperlipidemia, unspecified: Secondary | ICD-10-CM | POA: Diagnosis not present

## 2017-04-02 DIAGNOSIS — E1169 Type 2 diabetes mellitus with other specified complication: Secondary | ICD-10-CM | POA: Diagnosis not present

## 2017-04-15 DIAGNOSIS — M545 Low back pain: Secondary | ICD-10-CM | POA: Diagnosis not present

## 2017-04-15 DIAGNOSIS — M5416 Radiculopathy, lumbar region: Secondary | ICD-10-CM | POA: Diagnosis not present

## 2017-05-12 DIAGNOSIS — E559 Vitamin D deficiency, unspecified: Secondary | ICD-10-CM | POA: Diagnosis not present

## 2017-05-12 DIAGNOSIS — E1142 Type 2 diabetes mellitus with diabetic polyneuropathy: Secondary | ICD-10-CM | POA: Diagnosis not present

## 2017-05-12 DIAGNOSIS — E785 Hyperlipidemia, unspecified: Secondary | ICD-10-CM | POA: Diagnosis not present

## 2017-05-12 DIAGNOSIS — I10 Essential (primary) hypertension: Secondary | ICD-10-CM | POA: Diagnosis not present

## 2017-05-12 DIAGNOSIS — E039 Hypothyroidism, unspecified: Secondary | ICD-10-CM | POA: Diagnosis not present

## 2017-05-12 DIAGNOSIS — E1169 Type 2 diabetes mellitus with other specified complication: Secondary | ICD-10-CM | POA: Diagnosis not present

## 2017-05-12 DIAGNOSIS — E1165 Type 2 diabetes mellitus with hyperglycemia: Secondary | ICD-10-CM | POA: Diagnosis not present

## 2017-05-12 DIAGNOSIS — Z794 Long term (current) use of insulin: Secondary | ICD-10-CM | POA: Diagnosis not present

## 2017-05-13 DIAGNOSIS — E119 Type 2 diabetes mellitus without complications: Secondary | ICD-10-CM | POA: Diagnosis not present

## 2017-05-13 DIAGNOSIS — R200 Anesthesia of skin: Secondary | ICD-10-CM | POA: Diagnosis not present

## 2017-05-13 DIAGNOSIS — M549 Dorsalgia, unspecified: Secondary | ICD-10-CM | POA: Diagnosis not present

## 2017-05-19 DIAGNOSIS — E1142 Type 2 diabetes mellitus with diabetic polyneuropathy: Secondary | ICD-10-CM | POA: Diagnosis not present

## 2017-05-19 DIAGNOSIS — M79676 Pain in unspecified toe(s): Secondary | ICD-10-CM | POA: Diagnosis not present

## 2017-05-19 DIAGNOSIS — B351 Tinea unguium: Secondary | ICD-10-CM | POA: Diagnosis not present

## 2017-05-19 DIAGNOSIS — E119 Type 2 diabetes mellitus without complications: Secondary | ICD-10-CM | POA: Diagnosis not present

## 2017-05-19 DIAGNOSIS — L84 Corns and callosities: Secondary | ICD-10-CM | POA: Diagnosis not present

## 2017-05-28 DIAGNOSIS — D259 Leiomyoma of uterus, unspecified: Secondary | ICD-10-CM | POA: Diagnosis not present

## 2017-05-28 DIAGNOSIS — R1032 Left lower quadrant pain: Secondary | ICD-10-CM | POA: Diagnosis not present

## 2017-05-28 DIAGNOSIS — R102 Pelvic and perineal pain: Secondary | ICD-10-CM | POA: Diagnosis not present

## 2017-05-28 DIAGNOSIS — R300 Dysuria: Secondary | ICD-10-CM | POA: Diagnosis not present

## 2017-05-28 DIAGNOSIS — I700 Atherosclerosis of aorta: Secondary | ICD-10-CM | POA: Diagnosis not present

## 2017-05-28 DIAGNOSIS — M4716 Other spondylosis with myelopathy, lumbar region: Secondary | ICD-10-CM | POA: Diagnosis not present

## 2017-05-28 DIAGNOSIS — K5730 Diverticulosis of large intestine without perforation or abscess without bleeding: Secondary | ICD-10-CM | POA: Diagnosis not present

## 2017-05-28 DIAGNOSIS — M5106 Intervertebral disc disorders with myelopathy, lumbar region: Secondary | ICD-10-CM | POA: Diagnosis not present

## 2017-05-28 DIAGNOSIS — R590 Localized enlarged lymph nodes: Secondary | ICD-10-CM | POA: Diagnosis not present

## 2017-05-28 DIAGNOSIS — R338 Other retention of urine: Secondary | ICD-10-CM | POA: Diagnosis not present

## 2017-05-29 DIAGNOSIS — Z794 Long term (current) use of insulin: Secondary | ICD-10-CM | POA: Diagnosis not present

## 2017-05-29 DIAGNOSIS — G609 Hereditary and idiopathic neuropathy, unspecified: Secondary | ICD-10-CM | POA: Diagnosis not present

## 2017-05-29 DIAGNOSIS — E1165 Type 2 diabetes mellitus with hyperglycemia: Secondary | ICD-10-CM | POA: Diagnosis not present

## 2017-06-25 DIAGNOSIS — I10 Essential (primary) hypertension: Secondary | ICD-10-CM | POA: Diagnosis not present

## 2017-06-25 DIAGNOSIS — G4733 Obstructive sleep apnea (adult) (pediatric): Secondary | ICD-10-CM | POA: Diagnosis not present

## 2017-06-25 DIAGNOSIS — E782 Mixed hyperlipidemia: Secondary | ICD-10-CM | POA: Diagnosis not present

## 2017-06-25 DIAGNOSIS — G2581 Restless legs syndrome: Secondary | ICD-10-CM | POA: Diagnosis not present

## 2017-06-25 DIAGNOSIS — E1142 Type 2 diabetes mellitus with diabetic polyneuropathy: Secondary | ICD-10-CM | POA: Diagnosis not present

## 2017-06-25 DIAGNOSIS — E038 Other specified hypothyroidism: Secondary | ICD-10-CM | POA: Diagnosis not present

## 2017-06-25 DIAGNOSIS — I482 Chronic atrial fibrillation: Secondary | ICD-10-CM | POA: Diagnosis not present

## 2017-06-27 DIAGNOSIS — E782 Mixed hyperlipidemia: Secondary | ICD-10-CM | POA: Diagnosis not present

## 2017-06-27 DIAGNOSIS — I10 Essential (primary) hypertension: Secondary | ICD-10-CM | POA: Diagnosis not present

## 2017-07-01 DIAGNOSIS — G894 Chronic pain syndrome: Secondary | ICD-10-CM | POA: Diagnosis not present

## 2017-07-01 DIAGNOSIS — M5136 Other intervertebral disc degeneration, lumbar region: Secondary | ICD-10-CM | POA: Diagnosis not present

## 2017-07-01 DIAGNOSIS — M47816 Spondylosis without myelopathy or radiculopathy, lumbar region: Secondary | ICD-10-CM | POA: Diagnosis not present

## 2017-07-02 DIAGNOSIS — M47816 Spondylosis without myelopathy or radiculopathy, lumbar region: Secondary | ICD-10-CM | POA: Insufficient documentation

## 2017-07-07 DIAGNOSIS — M2550 Pain in unspecified joint: Secondary | ICD-10-CM | POA: Diagnosis not present

## 2017-07-07 DIAGNOSIS — Z79899 Other long term (current) drug therapy: Secondary | ICD-10-CM | POA: Diagnosis not present

## 2017-07-07 DIAGNOSIS — M159 Polyosteoarthritis, unspecified: Secondary | ICD-10-CM | POA: Diagnosis not present

## 2017-07-09 DIAGNOSIS — M47816 Spondylosis without myelopathy or radiculopathy, lumbar region: Secondary | ICD-10-CM | POA: Diagnosis not present

## 2017-07-30 DIAGNOSIS — M47816 Spondylosis without myelopathy or radiculopathy, lumbar region: Secondary | ICD-10-CM | POA: Diagnosis not present

## 2017-08-08 DIAGNOSIS — E119 Type 2 diabetes mellitus without complications: Secondary | ICD-10-CM | POA: Diagnosis not present

## 2017-08-08 DIAGNOSIS — H2513 Age-related nuclear cataract, bilateral: Secondary | ICD-10-CM | POA: Diagnosis not present

## 2017-08-08 DIAGNOSIS — H04123 Dry eye syndrome of bilateral lacrimal glands: Secondary | ICD-10-CM | POA: Diagnosis not present

## 2017-08-18 DIAGNOSIS — M545 Low back pain: Secondary | ICD-10-CM | POA: Diagnosis not present

## 2017-08-18 DIAGNOSIS — W1800XA Striking against unspecified object with subsequent fall, initial encounter: Secondary | ICD-10-CM | POA: Diagnosis not present

## 2017-08-18 DIAGNOSIS — S060X1A Concussion with loss of consciousness of 30 minutes or less, initial encounter: Secondary | ICD-10-CM | POA: Diagnosis not present

## 2017-08-18 DIAGNOSIS — S199XXA Unspecified injury of neck, initial encounter: Secondary | ICD-10-CM | POA: Diagnosis not present

## 2017-08-18 DIAGNOSIS — S161XXA Strain of muscle, fascia and tendon at neck level, initial encounter: Secondary | ICD-10-CM | POA: Diagnosis not present

## 2017-08-18 DIAGNOSIS — M542 Cervicalgia: Secondary | ICD-10-CM | POA: Diagnosis not present

## 2017-08-18 DIAGNOSIS — R51 Headache: Secondary | ICD-10-CM | POA: Diagnosis not present

## 2017-08-18 DIAGNOSIS — S39012A Strain of muscle, fascia and tendon of lower back, initial encounter: Secondary | ICD-10-CM | POA: Diagnosis not present

## 2017-08-18 DIAGNOSIS — Y998 Other external cause status: Secondary | ICD-10-CM | POA: Diagnosis not present

## 2017-08-18 DIAGNOSIS — S0990XA Unspecified injury of head, initial encounter: Secondary | ICD-10-CM | POA: Diagnosis not present

## 2017-08-18 DIAGNOSIS — T148XXA Other injury of unspecified body region, initial encounter: Secondary | ICD-10-CM | POA: Diagnosis not present

## 2017-08-18 DIAGNOSIS — M5489 Other dorsalgia: Secondary | ICD-10-CM | POA: Diagnosis not present

## 2018-05-15 DIAGNOSIS — R52 Pain, unspecified: Secondary | ICD-10-CM | POA: Diagnosis not present

## 2018-05-15 DIAGNOSIS — R410 Disorientation, unspecified: Secondary | ICD-10-CM | POA: Diagnosis not present

## 2018-05-15 DIAGNOSIS — R4182 Altered mental status, unspecified: Secondary | ICD-10-CM | POA: Insufficient documentation

## 2018-05-15 DIAGNOSIS — R404 Transient alteration of awareness: Secondary | ICD-10-CM | POA: Diagnosis not present

## 2018-05-15 DIAGNOSIS — R0902 Hypoxemia: Secondary | ICD-10-CM | POA: Diagnosis not present

## 2018-08-06 DIAGNOSIS — J449 Chronic obstructive pulmonary disease, unspecified: Secondary | ICD-10-CM | POA: Diagnosis not present

## 2018-08-06 DIAGNOSIS — E039 Hypothyroidism, unspecified: Secondary | ICD-10-CM

## 2018-08-06 DIAGNOSIS — R410 Disorientation, unspecified: Secondary | ICD-10-CM | POA: Diagnosis not present

## 2018-08-06 DIAGNOSIS — E119 Type 2 diabetes mellitus without complications: Secondary | ICD-10-CM

## 2018-08-06 DIAGNOSIS — G4733 Obstructive sleep apnea (adult) (pediatric): Secondary | ICD-10-CM

## 2018-08-06 DIAGNOSIS — I4891 Unspecified atrial fibrillation: Secondary | ICD-10-CM | POA: Diagnosis not present

## 2018-08-06 DIAGNOSIS — J111 Influenza due to unidentified influenza virus with other respiratory manifestations: Secondary | ICD-10-CM | POA: Diagnosis not present

## 2018-08-07 DIAGNOSIS — J449 Chronic obstructive pulmonary disease, unspecified: Secondary | ICD-10-CM | POA: Diagnosis not present

## 2018-08-07 DIAGNOSIS — J111 Influenza due to unidentified influenza virus with other respiratory manifestations: Secondary | ICD-10-CM | POA: Diagnosis not present

## 2018-08-07 DIAGNOSIS — R410 Disorientation, unspecified: Secondary | ICD-10-CM | POA: Diagnosis not present

## 2018-08-07 DIAGNOSIS — I4891 Unspecified atrial fibrillation: Secondary | ICD-10-CM | POA: Diagnosis not present

## 2018-08-08 DIAGNOSIS — J111 Influenza due to unidentified influenza virus with other respiratory manifestations: Secondary | ICD-10-CM | POA: Diagnosis not present

## 2018-08-08 DIAGNOSIS — J449 Chronic obstructive pulmonary disease, unspecified: Secondary | ICD-10-CM | POA: Diagnosis not present

## 2018-08-08 DIAGNOSIS — R410 Disorientation, unspecified: Secondary | ICD-10-CM | POA: Diagnosis not present

## 2018-08-08 DIAGNOSIS — I4891 Unspecified atrial fibrillation: Secondary | ICD-10-CM | POA: Diagnosis not present

## 2018-08-09 DIAGNOSIS — I4891 Unspecified atrial fibrillation: Secondary | ICD-10-CM | POA: Diagnosis not present

## 2018-08-09 DIAGNOSIS — J449 Chronic obstructive pulmonary disease, unspecified: Secondary | ICD-10-CM | POA: Diagnosis not present

## 2018-08-09 DIAGNOSIS — J111 Influenza due to unidentified influenza virus with other respiratory manifestations: Secondary | ICD-10-CM | POA: Diagnosis not present

## 2018-08-09 DIAGNOSIS — R410 Disorientation, unspecified: Secondary | ICD-10-CM | POA: Diagnosis not present

## 2018-08-25 ENCOUNTER — Encounter: Payer: Self-pay | Admitting: Family Medicine

## 2018-09-21 ENCOUNTER — Encounter: Payer: Self-pay | Admitting: Gastroenterology

## 2018-10-01 ENCOUNTER — Encounter: Payer: Self-pay | Admitting: Gastroenterology

## 2018-10-08 ENCOUNTER — Encounter (INDEPENDENT_AMBULATORY_CARE_PROVIDER_SITE_OTHER): Payer: Self-pay

## 2018-10-08 ENCOUNTER — Encounter: Payer: Self-pay | Admitting: Gastroenterology

## 2018-10-08 ENCOUNTER — Ambulatory Visit: Payer: Medicare Other | Admitting: Gastroenterology

## 2018-10-08 VITALS — BP 132/76 | HR 60 | Ht 63.0 in | Wt 193.5 lb

## 2018-10-08 DIAGNOSIS — K625 Hemorrhage of anus and rectum: Secondary | ICD-10-CM

## 2018-10-08 DIAGNOSIS — Z800 Family history of malignant neoplasm of digestive organs: Secondary | ICD-10-CM

## 2018-10-08 MED ORDER — SUPREP BOWEL PREP KIT 17.5-3.13-1.6 GM/177ML PO SOLN: 1.0000 | ORAL | 0 refills | Status: DC

## 2018-10-08 NOTE — Patient Instructions (Signed)
If you are age 72 or older, your body mass index should be between 23-30. Your Body mass index is 34.28 kg/m. If this is out of the aforementioned range listed, please consider follow up with your Primary Care Provider.  If you are age 14 or younger, your body mass index should be between 19-25. Your Body mass index is 34.28 kg/m. If this is out of the aformentioned range listed, please consider follow up with your Primary Care Provider.   You have been scheduled for a colonoscopy. Please follow written instructions given to you at your visit today.  Please pick up your prep supplies at the pharmacy within the next 1-3 days. If you use inhalers (even only as needed), please bring them with you on the day of your procedure. Your physician has requested that you go to www.startemmi.com and enter the access code given to you at your visit today. This web site gives a general overview about your procedure. However, you should still follow specific instructions given to you by our office regarding your preparation for the procedure.  You will be contacted by our office prior to your procedure for directions on holding your Xarelto.  If you do not hear from our office 1 week prior to your scheduled procedure, please call [phone] to discuss.    Thank you,  Dr. Jackquline Denmark

## 2018-10-08 NOTE — Progress Notes (Signed)
Chief Complaint: For colon  Referring Provider:  Adron Bene, PA-C      ASSESSMENT AND PLAN;   #1.  Rectal bleeding. D/d hoids, AVMs, colitis, polyps, stercoral ulcers etc, r/o colonic neoplasms, doubt IBD.  #2. FH of colon cancer ([place])  Plan: - Proceed with colonoscopy.  I have discussed the risks and benefits.  The risks including risk of perforation requiring laparotomy, bleeding after polypectomy requiring blood transfusions and risks of anesthesia/sedation were discussed. Patient is to report immediately if there is any significant weight loss or excessive bleeding until then. Consent forms were given for review. - Cardiology clearance for colon and holding Xeralto 24hr before colon. - Please obtain previous CBC (done 2 days ago).    HPI:    Caitlin [name] is a 73 y.o. female  2 weeks ago -had some rectal bleeding.  None since.  Could not tell whether it was mixed or away from the stool. occ lower abdo discomfort occ constipation, no diarrhea. Has family history of colon cancer in a second-degree relative. Seen by Dr. Tobie Poet, had CBC performed.  Do not have the results. Sent to the GI clinic for further evaluation by means of colonoscopy.  No further rectal bleeding. Patient does take Xarelto for paroxysmal atrial fibrillation.  No history of CHF.  No weight loss.  No over-the-counter nonsteroidals.   Past GI procedures: -Colonoscopy 12/2012 ([facility])-moderate predominantly sigmoid diverticulosis, small internal hemorrhoids. Past Medical History:  Diagnosis Date  . Atrial fibrillation ([place])   . Depression   . HTN (hypertension)   . Hypercholesteremia   . Obstructive sleep apnea   . Prediabetes   . RLS (restless legs syndrome)     Past Surgical History:  Procedure Laterality Date  . ATRIAL FIBRILLATION ABLATION     x3  . COLONOSCOPY  01/04/2013   Moderate predominantly sigmoid diverticulosis. Small internal hemorrhoids. Otherwise normal colonosopy.     Family  History  Problem Relation Age of Onset  . Colon cancer Paternal Aunt 33    Social History   Tobacco Use  . Smoking status: Never Smoker  . Smokeless tobacco: Never Used  Substance Use Topics  . Alcohol use: Not Currently  . Drug use: Never    Current Outpatient Medications  Medication Sig Dispense Refill  . albuterol (PROVENTIL) (2.5 MG/3ML) 0.083% nebulizer solution Take 2.5 mg by nebulization as needed.    . carvedilol (COREG) 6.25 MG tablet Take 6.25 mg by mouth 2 (two) times daily.    Marland Kitchen diltiazem (TIAZAC) 300 MG 24 hr capsule Take 300 mg by mouth daily.    . DULoxetine (CYMBALTA) 60 MG capsule Take 60 mg by mouth 2 (two) times daily.    . furosemide (LASIX) 80 MG tablet Take 40 mg by mouth as needed.    Marland Kitchen lisinopril (PRINIVIL,ZESTRIL) 20 MG tablet Take 20 mg by mouth daily.    Marland Kitchen omeprazole (PRILOSEC) 40 MG capsule Take 40 mg by mouth daily.    Marland Kitchen rOPINIRole (REQUIP) 2 MG tablet Take 2 mg by mouth 3 (three) times daily.    . Semaglutide,0.25 or 0.5MG /DOS, 2 MG/1.5ML SOPN Inject 0.25 mg into the skin once a week.    . traMADol (ULTRAM) 50 MG tablet Take 50 mg by mouth as needed.    . TRESIBA FLEXTOUCH 200 UNIT/ML SOPN Inject 70 Units into the skin daily.    Alveda Reasons 20 MG TABS tablet Take 20 mg by mouth daily.     No current facility-administered medications for  this visit.     Allergies  Allergen Reactions  . Codeine Nausea Only    Review of Systems:  Constitutional: Denies fever, chills, diaphoresis, appetite change and has fatigue.  HEENT: Denies photophobia, eye pain, redness, hearing loss, ear pain, congestion, sore throat, rhinorrhea, sneezing, mouth sores, neck pain, neck stiffness and tinnitus.   Respiratory: Denies SOB, DOE, cough, chest tightness,  and wheezing.   Cardiovascular: Denies chest pain, palpitations and leg swelling.  Genitourinary: Denies dysuria, urgency, frequency, hematuria, flank pain and difficulty urinating.  Musculoskeletal: Has back pain,  No joint swelling, arthralgias and gait problem.  Has arthritis. Skin: No rash.  Neurological: Denies dizziness, seizures, syncope, weakness, light-headedness, numbness and headaches.  Hematological: Denies adenopathy. Easy bruising, personal or family bleeding history  Psychiatric/Behavioral: has anxiety or depression     Physical Exam:    BP 132/76   Pulse 60   Ht 5\' 3"  (1.6 m)   Wt 193 lb 8 oz (87.8 kg)   SpO2 96%   BMI 34.28 kg/m  Filed Weights   10/08/18 1051  Weight: 193 lb 8 oz (87.8 kg)   Constitutional:  Well-developed, in no acute distress. Psychiatric: Normal mood and affect. Behavior is normal. HEENT: Pupils normal.  Conjunctivae are normal. No scleral icterus. Cardiovascular: Normal rate, regular rhythm. No edema Pulmonary/chest: Effort normal and breath sounds normal. No wheezing, rales or rhonchi. Abdominal: Soft, nondistended. Nontender. Bowel sounds active throughout. There are no masses palpable. No hepatomegaly. Rectal: To be performed at the time of colonoscopy. Neurological: Alert and oriented to person place and time. Skin: Skin is warm and dry. No rashes noted.    Carmell Austria, MD 10/08/2018, 10:57 AM  Cc: Adron Bene, PA-C

## 2018-10-15 ENCOUNTER — Encounter: Payer: Self-pay | Admitting: Gastroenterology

## 2018-10-15 ENCOUNTER — Ambulatory Visit (AMBULATORY_SURGERY_CENTER): Payer: Medicare Other | Admitting: Gastroenterology

## 2018-10-15 VITALS — BP 147/79 | HR 90 | Temp 97.5°F | Resp 12 | Ht 63.0 in | Wt 193.5 lb

## 2018-10-15 DIAGNOSIS — K648 Other hemorrhoids: Secondary | ICD-10-CM

## 2018-10-15 DIAGNOSIS — K625 Hemorrhage of anus and rectum: Secondary | ICD-10-CM | POA: Diagnosis not present

## 2018-10-15 DIAGNOSIS — K5730 Diverticulosis of large intestine without perforation or abscess without bleeding: Secondary | ICD-10-CM | POA: Diagnosis not present

## 2018-10-15 MED ORDER — SODIUM CHLORIDE 0.9 % IV SOLN: 500.0000 mL | Freq: Once | INTRAVENOUS | Status: DC

## 2018-10-15 MED ORDER — HYDROCORTISONE 2.5 % RE CREA: 1.0000 "application " | TOPICAL_CREAM | Freq: Two times a day (BID) | RECTAL | 4 refills | Status: DC

## 2018-10-15 NOTE — Progress Notes (Signed)
Report to PACU, RN, vss, BBS= Clear.  

## 2018-10-15 NOTE — Patient Instructions (Signed)
PICK UP YOUR PRESCRIPTION AT YOUR [facility].  HANDOUTS GIVEN : RESUME YOUR [place].  USE BENEFIBER 1 TEASPOON DAILY.    YOU HAD AN ENDOSCOPIC PROCEDURE TODAY AT [facility]:   Refer to the procedure report that was given to you for any specific questions about what was found during the examination.  If the procedure report does not answer your questions, please call your gastroenterologist to clarify.  If you requested that your care partner not be given the details of your procedure findings, then the procedure report has been included in a sealed envelope for you to review at your convenience later.  YOU SHOULD EXPECT: Some feelings of bloating in the abdomen. Passage of more gas than usual.  Walking can help get rid of the air that was put into your GI tract during the procedure and reduce the bloating. If you had a lower endoscopy (such as a colonoscopy or flexible sigmoidoscopy) you may notice spotting of blood in your stool or on the toilet paper. If you underwent a bowel prep for your procedure, you may not have a normal bowel movement for a few days.  Please Note:  You might notice some irritation and congestion in your nose or some drainage.  This is from the oxygen used during your procedure.  There is no need for concern and it should clear up in a day or so.  SYMPTOMS TO REPORT IMMEDIATELY:   Following lower endoscopy (colonoscopy or flexible sigmoidoscopy):  Excessive amounts of blood in the stool  Significant tenderness or worsening of abdominal pains  Swelling of the abdomen that is new, acute  Fever of 100F or higher   For urgent or emergent issues, a gastroenterologist can be reached at any hour by calling [phone].   DIET:  We do recommend a small meal at first, but then you may proceed to your regular diet.  Drink plenty of fluids but you should avoid alcoholic beverages for 24 hours.  ACTIVITY:  You should plan to take it easy  for the rest of today and you should NOT DRIVE or use heavy machinery until tomorrow (because of the sedation medicines used during the test).    FOLLOW UP: Our staff will call the number listed on your records the next business day following your procedure to check on you and address any questions or concerns that you may have regarding the information given to you following your procedure. If we do not reach you, we will leave a message.  However, if you are feeling well and you are not experiencing any problems, there is no need to return our call.  We will assume that you have returned to your regular daily activities without incident.  If any biopsies were taken you will be contacted by phone or by letter within the next 1-3 weeks.  Please call us at [phone] if you have not heard about the biopsies in 3 weeks.    SIGNATURES/CONFIDENTIALITY: You and/or your care partner have signed paperwork which will be entered into your electronic medical record.  These signatures attest to the fact that that the information above on your After Visit Summary has been reviewed and is understood.  Full responsibility of the confidentiality of this discharge information lies with you and/or your care-partner.

## 2018-10-15 NOTE — Op Note (Signed)
[facility] Patient Name: Caitlin [name] Procedure Date: 10/15/2018 3:00 PM MRN: [id number] Endoscopist: Jackquline Denmark , MD Age: 73 Referring MD:  Date of Birth: 02/16/1946 Gender: Female Account #: [id number] Procedure:                Colonoscopy Indications:              Rectal bleeding Medicines:                Monitored Anesthesia Care Procedure:                Pre-Anesthesia Assessment:                           - Prior to the procedure, a History and Physical                            was performed, and patient medications and                            allergies were reviewed. The patient's tolerance of                            previous anesthesia was also reviewed. The risks                            and benefits of the procedure and the sedation                            options and risks were discussed with the patient.                            All questions were answered, and informed consent                            was obtained. Prior Anticoagulants: The patient has                            taken Xarelto (rivaroxaban), last dose was 2 days                            prior to procedure. ASA Grade Assessment: II - A                            patient with mild systemic disease. After reviewing                            the risks and benefits, the patient was deemed in                            satisfactory condition to undergo the procedure.                           After obtaining informed consent, the colonoscope  was passed under direct vision. Throughout the                            procedure, the patient's blood pressure, pulse, and                            oxygen saturations were monitored continuously. The                            Colonoscope was introduced through the anus and                            advanced to the 2 cm into the ileum. The quality of                            the bowel preparation was good.  The terminal ileum,                            ileocecal valve, appendiceal orifice, and rectum                            were photographed. Scope In: 3:16:08 PM Scope Out: 3:27:36 PM Scope Withdrawal Time: 0 hours 6 minutes 47 seconds  Total Procedure Duration: 0 hours 11 minutes 28 seconds  Findings:                 Multiple small and large-mouthed diverticula were                            found in the sigmoid colon, few in descending colon                            and ascending colon.                           Non-bleeding internal hemorrhoids were found during                            retroflexion. The hemorrhoids were moderate.                           The exam was otherwise without abnormality on                            direct and retroflexion views.                           The terminal ileum appeared normal. Complications:            No immediate complications. Estimated Blood Loss:     Estimated blood loss: none. Impression:               -Pancolonic diverticulosis predominantly in the                            sigmoid colon.                           -  Moderate internal hemorrhoids (likely etiology of                            rectal bleeding, no bleeding currently).                           -Otherwise normal colonoscopy to TI. Recommendation:           - Patient has a contact number available for                            emergencies. The signs and symptoms of potential                            delayed complications were discussed with the                            patient. Return to normal activities tomorrow.                            Written discharge instructions were provided to the                            patient.                           - Resume previous diet.                           - Continue present medications.                           - Resume Xarelto (rivaroxaban) at prior dose today.                           - Repeat colonoscopy in  10 years. Earlier, if with                            any new problems or if there is any change in                            family history.                           - Use Benefiber one teaspoon PO daily.                           - Use HC Cream 2.5%: Apply externally BID for 10                            days. 4 refills. Jackquline Denmark, MD 10/15/2018 3:34:08 PM This report has been signed electronically.

## 2018-10-16 ENCOUNTER — Telehealth: Payer: Self-pay

## 2018-10-16 NOTE — Telephone Encounter (Signed)
Attempted to reach pt. With follow-up call following endoscopic procedure 10/15/2018.  LM on pt. Voice mail.  Will try to reach pt. Again later today.

## 2018-10-16 NOTE — Telephone Encounter (Signed)
  Follow up Call-  Call back number 10/15/2018  Post procedure Call Back phone  # [phone] home  Permission to leave phone message Yes  Some recent data might be hidden     Patient questions:  Do you have a fever, pain , or abdominal swelling? No. Pain Score  0 *  Have you tolerated food without any problems? Yes.    Have you been able to return to your normal activities? Yes.    Do you have any questions about your discharge instructions: Diet   No. Medications  No. Follow up visit  No.  Do you have questions or concerns about your Care? No.  Actions: * If pain score is 4 or above: No action needed, pain <4.

## 2018-10-16 NOTE — Telephone Encounter (Signed)
Pt husband called back and stated that she is doing good.

## 2018-10-16 NOTE — Telephone Encounter (Signed)
Attempted to reach pt. With follow-up call following

## 2018-10-16 NOTE — Telephone Encounter (Signed)
Attempted to reach pt. With follow up call following endoscopic procedure 10/15/2018.  LM on pt. Voice mail.  Will try to reach pt. Again later today.

## 2019-02-05 DIAGNOSIS — E559 Vitamin D deficiency, unspecified: Secondary | ICD-10-CM | POA: Diagnosis not present

## 2019-02-05 DIAGNOSIS — E1140 Type 2 diabetes mellitus with diabetic neuropathy, unspecified: Secondary | ICD-10-CM | POA: Diagnosis not present

## 2019-02-05 DIAGNOSIS — E1165 Type 2 diabetes mellitus with hyperglycemia: Secondary | ICD-10-CM | POA: Diagnosis not present

## 2019-02-05 DIAGNOSIS — E039 Hypothyroidism, unspecified: Secondary | ICD-10-CM | POA: Diagnosis not present

## 2019-05-10 ENCOUNTER — Other Ambulatory Visit: Payer: Self-pay | Admitting: Neurological Surgery

## 2019-06-04 ENCOUNTER — Encounter (HOSPITAL_COMMUNITY): Admission: RE | Payer: Self-pay | Source: Home / Self Care

## 2019-06-04 ENCOUNTER — Inpatient Hospital Stay (HOSPITAL_COMMUNITY): Admission: RE | Admit: 2019-06-04 | Payer: Medicare Other | Source: Home / Self Care | Admitting: Neurological Surgery

## 2019-06-04 SURGERY — POSTERIOR LUMBAR FUSION 2 LEVEL
Anesthesia: General | Site: Back

## 2019-07-28 ENCOUNTER — Other Ambulatory Visit: Payer: Self-pay | Admitting: Neurological Surgery

## 2019-07-31 ENCOUNTER — Other Ambulatory Visit (HOSPITAL_COMMUNITY)
Admission: RE | Admit: 2019-07-31 | Discharge: 2019-07-31 | Disposition: A | Discharge status: Home / Self Care | Payer: Medicare Other | Source: Ambulatory Visit | Attending: Neurological Surgery | Admitting: Neurological Surgery

## 2019-07-31 DIAGNOSIS — Z20828 Contact with and (suspected) exposure to other viral communicable diseases: Secondary | ICD-10-CM | POA: Diagnosis not present

## 2019-07-31 DIAGNOSIS — Z01812 Encounter for preprocedural laboratory examination: Secondary | ICD-10-CM | POA: Insufficient documentation

## 2019-08-01 LAB — NOVEL CORONAVIRUS, NAA (HOSP ORDER, SEND-OUT TO REF LAB; TAT 18-24 HRS): SARS-CoV-2, NAA: NOT DETECTED

## 2019-08-03 ENCOUNTER — Other Ambulatory Visit: Payer: Self-pay | Admitting: Neurological Surgery

## 2019-08-03 ENCOUNTER — Other Ambulatory Visit: Payer: Self-pay

## 2019-08-03 ENCOUNTER — Encounter (HOSPITAL_COMMUNITY): Payer: Self-pay | Admitting: Neurological Surgery

## 2019-08-03 NOTE — Progress Notes (Addendum)
Caitlin [name] denies chest pain or shortness of breath. Patient tested negative for Covid and has been in [place] with husband.  Caitlin [name] has history - does not have a CPAP; patient reports that she does stop breathing at times.   Caitlin [name] was treated for UTI 07/14/2019, patient was prescribed Macrodantin 100 mg bid for 1 week. Caitlin [name] reports still having discomfort when she voids. "Maybe they should do a urine test in am." I will call and inform Caitlin [name] of discomfort when voiding.  Caitlin [name] has type II diabetic, she reports that last A1C was good, she doesn't remember the number. Patient reports that CBGs run in the 100's.  I instructed patient to take 1/2 of Tresiba 40 units tonight.  I instructed patient to check CBG after awaking and every 2 hours until arrival  to the [name].  I Instructed patient if CBG is less than 70 to drink  1/2 cup of a clear juice. Recheck CBG in 15 minutes then call pre- op desk at [phone] for further instructions.    Caitlin. [name] said that her cardiologist and endocrinologist cleared her for surgery and sent information to Dr Ronnald Ramp' office. I will ask for information to be faxed to [name].  I spoke with Caitlin [name] , she will send clearance regards and will inform Dr Ronnald Ramp of discomfort while voiding.

## 2019-08-03 NOTE — Progress Notes (Signed)
Anesthesia Chart Review: Same day workup  Follows with cardiology at [facility] for hx of afib s/p  ablation of left atrial flutter and atrial fibrillation in 2015 with redo procedure in 2017. Last seen 05/04/19. Per note "Her blood pressure is elevated today. This has been managed by the primary care. She remains on anticoagulation for AF and denies any bleeding issues. She is not active and is planning to have Lumbar Fusion which is not yet scheduled. Dr Ola Spurr has already signed the clearance for this procedure and this has been faxed. Overall she appears to be doing well from a cardiac standpoint. EKG today demonstrates NSR rate 93. I will make no changes to her medical regimen and she will see Dr Ola Spurr in 1 yr."  Will need DOS labs and eval.   EKG 05/04/19 (care everywhere, narrative only): Sinus rhythm. Rate 93.  TTE 05/17/16 (care everywhere): Conclusions Summary Normal left ventricle size and systolic function, with no segmental abnormality. Trace Mitral and tricuspid regurgitation No thrombus visualized.   Caitlin [name] [facility] Short Stay Center/Anesthesiology Phone [phone] 08/03/2019 2:05 PM

## 2019-08-03 NOTE — Anesthesia Preprocedure Evaluation (Addendum)
Anesthesia Evaluation  Patient identified by MRN, date of birth, ID band Patient awake    Reviewed: Allergy & Precautions, NPO status , Patient's Chart, lab work & pertinent test results  History of Anesthesia Complications (+) PONV and history of anesthetic complications  Airway Mallampati: II  TM Distance: >3 FB Neck ROM: Full    Dental no notable dental hx. (+) Chipped, Dental Advisory Given,    Pulmonary asthma , sleep apnea (does not wear CPAP) , COPD,  COPD inhaler,    Pulmonary exam normal breath sounds clear to auscultation       Cardiovascular hypertension, Normal cardiovascular exam+ dysrhythmias (s/p ablation 2015, 2017) Atrial Fibrillation  Rhythm:Regular Rate:Normal   EKG 05/04/19 (care everywhere, narrative only): Sinus rhythm. Rate 93.  TTE 05/17/16 (care everywhere): Normal left ventricle size and systolic function, with no segmental abnormality. Trace Mitral and tricuspid regurgitation No thrombus visualized.   Neuro/Psych negative neurological ROS  negative psych ROS   GI/Hepatic Neg liver ROS, GERD  Medicated,  Endo/Other  negative endocrine ROSdiabetes, Insulin Dependent  Renal/GU negative Renal ROS  negative genitourinary   Musculoskeletal  (+) Arthritis ,   Abdominal   Peds  Hematology  (+) Blood dyscrasia (on xarelto, last dose 10 days ago), ,   Anesthesia Other Findings   Reproductive/Obstetrics                           Anesthesia Physical Anesthesia Plan  ASA: III  Anesthesia Plan: General   Post-op Pain Management:    Induction: Intravenous  PONV Risk Score and Plan: 4 or greater and Midazolam, Dexamethasone, Ondansetron and Treatment may vary due to age or medical condition  Airway Management Planned: Oral ETT  Additional Equipment:   Intra-op Plan:   Post-operative Plan: Extubation in OR  Informed Consent: I have reviewed the patients History  and Physical, chart, labs and discussed the procedure including the risks, benefits and alternatives for the proposed anesthesia with the patient or authorized representative who has indicated his/her understanding and acceptance.     Dental advisory given  Plan Discussed with: CRNA  Anesthesia Plan Comments:       Anesthesia Quick Evaluation

## 2019-08-04 ENCOUNTER — Other Ambulatory Visit: Payer: Self-pay

## 2019-08-04 ENCOUNTER — Inpatient Hospital Stay (HOSPITAL_COMMUNITY)
Admission: RE | Admit: 2019-08-04 | Discharge: 2019-08-05 | DRG: 455 | Disposition: A | Discharge status: Home / Self Care | Payer: Medicare Other | Attending: Neurological Surgery | Admitting: Neurological Surgery

## 2019-08-04 ENCOUNTER — Inpatient Hospital Stay (HOSPITAL_COMMUNITY): Payer: Medicare Other | Admitting: Physician Assistant

## 2019-08-04 ENCOUNTER — Inpatient Hospital Stay (HOSPITAL_COMMUNITY): Payer: Medicare Other

## 2019-08-04 ENCOUNTER — Inpatient Hospital Stay (HOSPITAL_COMMUNITY)
Admission: RE | Disposition: A | Discharge status: Home / Self Care | Payer: Self-pay | Source: Home / Self Care | Attending: Neurological Surgery

## 2019-08-04 ENCOUNTER — Encounter (HOSPITAL_COMMUNITY): Payer: Self-pay | Admitting: Neurological Surgery

## 2019-08-04 DIAGNOSIS — Z885 Allergy status to narcotic agent status: Secondary | ICD-10-CM | POA: Diagnosis not present

## 2019-08-04 DIAGNOSIS — Z419 Encounter for procedure for purposes other than remedying health state, unspecified: Secondary | ICD-10-CM

## 2019-08-04 DIAGNOSIS — Z981 Arthrodesis status: Secondary | ICD-10-CM

## 2019-08-04 DIAGNOSIS — I4891 Unspecified atrial fibrillation: Secondary | ICD-10-CM | POA: Diagnosis not present

## 2019-08-04 DIAGNOSIS — K219 Gastro-esophageal reflux disease without esophagitis: Secondary | ICD-10-CM | POA: Diagnosis present

## 2019-08-04 DIAGNOSIS — E119 Type 2 diabetes mellitus without complications: Secondary | ICD-10-CM | POA: Diagnosis not present

## 2019-08-04 DIAGNOSIS — J449 Chronic obstructive pulmonary disease, unspecified: Secondary | ICD-10-CM | POA: Diagnosis not present

## 2019-08-04 DIAGNOSIS — M4316 Spondylolisthesis, lumbar region: Secondary | ICD-10-CM | POA: Diagnosis present

## 2019-08-04 DIAGNOSIS — M48061 Spinal stenosis, lumbar region without neurogenic claudication: Secondary | ICD-10-CM | POA: Diagnosis not present

## 2019-08-04 DIAGNOSIS — M47816 Spondylosis without myelopathy or radiculopathy, lumbar region: Secondary | ICD-10-CM | POA: Diagnosis present

## 2019-08-04 DIAGNOSIS — I10 Essential (primary) hypertension: Secondary | ICD-10-CM | POA: Diagnosis not present

## 2019-08-04 DIAGNOSIS — Z794 Long term (current) use of insulin: Secondary | ICD-10-CM | POA: Diagnosis not present

## 2019-08-04 DIAGNOSIS — G2581 Restless legs syndrome: Secondary | ICD-10-CM | POA: Diagnosis not present

## 2019-08-04 DIAGNOSIS — Z7901 Long term (current) use of anticoagulants: Secondary | ICD-10-CM

## 2019-08-04 DIAGNOSIS — M1990 Unspecified osteoarthritis, unspecified site: Secondary | ICD-10-CM | POA: Diagnosis not present

## 2019-08-04 DIAGNOSIS — F329 Major depressive disorder, single episode, unspecified: Secondary | ICD-10-CM | POA: Diagnosis present

## 2019-08-04 DIAGNOSIS — G4733 Obstructive sleep apnea (adult) (pediatric): Secondary | ICD-10-CM | POA: Diagnosis not present

## 2019-08-04 DIAGNOSIS — Z79899 Other long term (current) drug therapy: Secondary | ICD-10-CM

## 2019-08-04 DIAGNOSIS — M4310 Spondylolisthesis, site unspecified: Secondary | ICD-10-CM

## 2019-08-04 HISTORY — DX: Nausea with vomiting, unspecified: R11.2

## 2019-08-04 HISTORY — DX: Gastro-esophageal reflux disease without esophagitis: K21.9

## 2019-08-04 HISTORY — DX: Cardiac arrhythmia, unspecified: I49.9

## 2019-08-04 HISTORY — DX: Arthrodesis status: Z98.1

## 2019-08-04 HISTORY — DX: Unspecified asthma, uncomplicated: J45.909

## 2019-08-04 HISTORY — DX: Other specified postprocedural states: Z98.890

## 2019-08-04 HISTORY — DX: Other complications of anesthesia, initial encounter: T88.59XA

## 2019-08-04 LAB — BASIC METABOLIC PANEL
Anion gap: 10 (ref 5–15)
BUN: 18 mg/dL (ref 8–23)
CO2: 26 mmol/L (ref 22–32)
Calcium: 9.2 mg/dL (ref 8.9–10.3)
Chloride: 104 mmol/L (ref 98–111)
Creatinine, Ser: 0.72 mg/dL (ref 0.44–1.00)
GFR calc Af Amer: >60 mL/min (ref >60)
GFR calc non Af Amer: >60 mL/min (ref >60)
Glucose, Bld: 84 mg/dL (ref 70–99)
Potassium: 4.6 mmol/L (ref 3.5–5.1)
Sodium: 140 mmol/L (ref 135–145)

## 2019-08-04 LAB — GLUCOSE, CAPILLARY
Glucose-Capillary: 77 mg/dL (ref 70–99)
Glucose-Capillary: 56 mg/dL — ABNORMAL LOW (ref 70–99)
Glucose-Capillary: 139 mg/dL — ABNORMAL HIGH (ref 70–99)
Glucose-Capillary: 114 mg/dL — ABNORMAL HIGH (ref 70–99)
Glucose-Capillary: 173 mg/dL — ABNORMAL HIGH (ref 70–99)

## 2019-08-04 LAB — CBC WITH DIFFERENTIAL/PLATELET
Abs Immature Granulocytes: 0.02 10*3/uL (ref 0.00–0.07)
Basophils Absolute: 0 10*3/uL (ref 0.0–0.1)
Basophils Relative: 1 %
Eosinophils Absolute: 0.3 10*3/uL (ref 0.0–0.5)
Eosinophils Relative: 4 %
HCT: 41.1 % (ref 36.0–46.0)
Hemoglobin: 13.2 g/dL (ref 12.0–15.0)
Immature Granulocytes: 0 %
Lymphocytes Relative: 30 %
Lymphs Abs: 2.2 10*3/uL (ref 0.7–4.0)
MCH: 28.8 pg (ref 26.0–34.0)
MCHC: 32.1 g/dL (ref 30.0–36.0)
MCV: 89.7 fL (ref 80.0–100.0)
Monocytes Absolute: 0.6 10*3/uL (ref 0.1–1.0)
Monocytes Relative: 8 %
Neutro Abs: 4.4 10*3/uL (ref 1.7–7.7)
Neutrophils Relative %: 57 %
Platelets: 274 10*3/uL (ref 150–400)
RBC: 4.58 MIL/uL (ref 3.87–5.11)
RDW: 13.5 % (ref 11.5–15.5)
WBC: 7.6 10*3/uL (ref 4.0–10.5)
nRBC: 0 % (ref 0.0–0.2)

## 2019-08-04 LAB — SURGICAL PCR SCREEN
MRSA, PCR: NEGATIVE
Staphylococcus aureus: NEGATIVE

## 2019-08-04 LAB — TYPE AND SCREEN
ABO/RH(D): O POS
Antibody Screen: NEGATIVE

## 2019-08-04 LAB — PROTIME-INR
INR: 0.9 (ref 0.8–1.2)
Prothrombin Time: 12.5 seconds (ref 11.4–15.2)

## 2019-08-04 LAB — ABO/RH: ABO/RH(D): O POS

## 2019-08-04 SURGERY — POSTERIOR LUMBAR FUSION 2 LEVEL
Anesthesia: General | Site: Spine Lumbar

## 2019-08-04 MED ORDER — METHOCARBAMOL 1000 MG/10ML IJ SOLN
500.0000 mg | Freq: Four times a day (QID) | INTRAVENOUS | Status: DC | PRN
  Filled 2019-08-04: qty 5

## 2019-08-04 MED ORDER — SUFENTANIL CITRATE 50 MCG/ML IV SOLN
INTRAVENOUS | Status: DC | PRN
  Administered 2019-08-04: 5 ug via INTRAVENOUS
  Administered 2019-08-04: 15 ug via INTRAVENOUS
  Administered 2019-08-04: 5 ug via INTRAVENOUS

## 2019-08-04 MED ORDER — DEXTROSE 50 % IV SOLN
INTRAVENOUS | Status: AC
  Administered 2019-08-04: 25 mL via INTRAVENOUS
  Filled 2019-08-04: qty 50

## 2019-08-04 MED ORDER — PHENOL 1.4 % MT LIQD: 1.0000 | OROMUCOSAL | Status: DC | PRN

## 2019-08-04 MED ORDER — MORPHINE SULFATE (PF) 2 MG/ML IV SOLN
2.0000 mg | INTRAVENOUS | Status: DC | PRN
  Administered 2019-08-04: 2 mg via INTRAVENOUS
  Filled 2019-08-04: qty 1

## 2019-08-04 MED ORDER — DEXAMETHASONE SODIUM PHOSPHATE 10 MG/ML IJ SOLN
10.0000 mg | Freq: Once | INTRAMUSCULAR | Status: AC
  Administered 2019-08-04: 10 mg via INTRAVENOUS
  Filled 2019-08-04: qty 1

## 2019-08-04 MED ORDER — ALBUTEROL SULFATE (2.5 MG/3ML) 0.083% IN NEBU: 2.5000 mg | INHALATION_SOLUTION | Freq: Four times a day (QID) | RESPIRATORY_TRACT | Status: DC | PRN

## 2019-08-04 MED ORDER — DULOXETINE HCL 30 MG PO CPEP
60.0000 mg | ORAL_CAPSULE | Freq: Every evening | ORAL | Status: DC
  Administered 2019-08-04: 60 mg via ORAL
  Filled 2019-08-04: qty 2

## 2019-08-04 MED ORDER — PHENYLEPHRINE 40 MCG/ML (10ML) SYRINGE FOR IV PUSH (FOR BLOOD PRESSURE SUPPORT)
PREFILLED_SYRINGE | INTRAVENOUS | Status: DC | PRN
  Administered 2019-08-04 (×4): 80 ug via INTRAVENOUS

## 2019-08-04 MED ORDER — OXYCODONE HCL 5 MG PO TABS
5.0000 mg | ORAL_TABLET | ORAL | Status: DC | PRN
  Administered 2019-08-04 – 2019-08-05 (×4): 5 mg via ORAL
  Filled 2019-08-04 (×4): qty 1

## 2019-08-04 MED ORDER — PHENYLEPHRINE HCL-NACL 10-0.9 MG/250ML-% IV SOLN
INTRAVENOUS | Status: DC | PRN
  Administered 2019-08-04: 25 ug/min via INTRAVENOUS

## 2019-08-04 MED ORDER — ROPINIROLE HCL 1 MG PO TABS
2.0000 mg | ORAL_TABLET | Freq: Two times a day (BID) | ORAL | Status: DC
  Administered 2019-08-04: 2 mg via ORAL
  Filled 2019-08-04: qty 2

## 2019-08-04 MED ORDER — HEPARIN SODIUM (PORCINE) 1000 UNIT/ML IJ SOLN
INTRAMUSCULAR | Status: DC | PRN
  Administered 2019-08-04: 10000 [IU] via INTRAVENOUS

## 2019-08-04 MED ORDER — BUPIVACAINE HCL (PF) 0.25 % IJ SOLN
INTRAMUSCULAR | Status: DC | PRN
  Administered 2019-08-04: 7 mL

## 2019-08-04 MED ORDER — CEFAZOLIN SODIUM-DEXTROSE 2-4 GM/100ML-% IV SOLN
2.0000 g | INTRAVENOUS | Status: AC
  Administered 2019-08-04: 2 g via INTRAVENOUS
  Filled 2019-08-04: qty 100

## 2019-08-04 MED ORDER — FENTANYL CITRATE (PF) 100 MCG/2ML IJ SOLN
25.0000 ug | INTRAMUSCULAR | Status: DC | PRN
  Administered 2019-08-04: 50 ug via INTRAVENOUS
  Administered 2019-08-04 (×2): 25 ug via INTRAVENOUS
  Administered 2019-08-04: 50 ug via INTRAVENOUS

## 2019-08-04 MED ORDER — ONDANSETRON HCL 4 MG PO TABS
4.0000 mg | ORAL_TABLET | Freq: Four times a day (QID) | ORAL | Status: DC | PRN
  Filled 2019-08-04 (×2): qty 1

## 2019-08-04 MED ORDER — POTASSIUM GLUCONATE 550 (90 K) MG PO TABS: 550.0000 mg | ORAL_TABLET | Freq: Every day | ORAL | Status: DC | PRN

## 2019-08-04 MED ORDER — OLOPATADINE HCL 0.1 % OP SOLN
1.0000 [drp] | Freq: Every day | OPHTHALMIC | Status: DC
  Filled 2019-08-04: qty 5

## 2019-08-04 MED ORDER — CEFAZOLIN SODIUM-DEXTROSE 2-4 GM/100ML-% IV SOLN
2.0000 g | Freq: Three times a day (TID) | INTRAVENOUS | Status: AC
  Administered 2019-08-04 – 2019-08-05 (×2): 2 g via INTRAVENOUS
  Filled 2019-08-04: qty 100

## 2019-08-04 MED ORDER — METHOCARBAMOL 500 MG PO TABS
500.0000 mg | ORAL_TABLET | Freq: Four times a day (QID) | ORAL | Status: DC | PRN
  Administered 2019-08-04 – 2019-08-05 (×2): 500 mg via ORAL
  Filled 2019-08-04 (×2): qty 1

## 2019-08-04 MED ORDER — PANTOPRAZOLE SODIUM 40 MG PO TBEC: 40.0000 mg | DELAYED_RELEASE_TABLET | Freq: Every day | ORAL | Status: DC

## 2019-08-04 MED ORDER — DEXTROSE 50 % IV SOLN: 25.0000 mL | Freq: Once | INTRAVENOUS | Status: AC

## 2019-08-04 MED ORDER — CHLORHEXIDINE GLUCONATE CLOTH 2 % EX PADS: 6.0000 | MEDICATED_PAD | Freq: Once | CUTANEOUS | Status: DC

## 2019-08-04 MED ORDER — SENNA 8.6 MG PO TABS
1.0000 | ORAL_TABLET | Freq: Two times a day (BID) | ORAL | Status: DC
  Administered 2019-08-04: 8.6 mg via ORAL
  Filled 2019-08-04: qty 1

## 2019-08-04 MED ORDER — BUPIVACAINE HCL (PF) 0.25 % IJ SOLN
INTRAMUSCULAR | Status: AC
  Filled 2019-08-04: qty 30

## 2019-08-04 MED ORDER — ARTHREX ANGEL - ACD-A SOLUTION (CHARTING ONLY) OPTIME
TOPICAL | Status: DC | PRN
  Administered 2019-08-04: 16:00:00 10 mL via TOPICAL

## 2019-08-04 MED ORDER — ONDANSETRON HCL 4 MG/2ML IJ SOLN
INTRAMUSCULAR | Status: AC
  Filled 2019-08-04: qty 2

## 2019-08-04 MED ORDER — FENTANYL CITRATE (PF) 100 MCG/2ML IJ SOLN
INTRAMUSCULAR | Status: AC
  Filled 2019-08-04: qty 2

## 2019-08-04 MED ORDER — THROMBIN 20000 UNITS EX SOLR
CUTANEOUS | Status: AC
  Filled 2019-08-04: qty 20000

## 2019-08-04 MED ORDER — EPHEDRINE SULFATE-NACL 50-0.9 MG/10ML-% IV SOSY
PREFILLED_SYRINGE | INTRAVENOUS | Status: DC | PRN
  Administered 2019-08-04 (×3): 5 mg via INTRAVENOUS
  Administered 2019-08-04: 15 mg via INTRAVENOUS
  Administered 2019-08-04: 5 mg via INTRAVENOUS

## 2019-08-04 MED ORDER — MENTHOL 3 MG MT LOZG: 1.0000 | LOZENGE | OROMUCOSAL | Status: DC | PRN

## 2019-08-04 MED ORDER — ACETAMINOPHEN 650 MG RE SUPP: 650.0000 mg | RECTAL | Status: DC | PRN

## 2019-08-04 MED ORDER — THROMBIN 5000 UNITS EX SOLR
CUTANEOUS | Status: AC
  Filled 2019-08-04: qty 5000

## 2019-08-04 MED ORDER — INSULIN ASPART 100 UNIT/ML ~~LOC~~ SOLN
0.0000 [IU] | Freq: Three times a day (TID) | SUBCUTANEOUS | Status: DC
  Administered 2019-08-05: 3 [IU] via SUBCUTANEOUS

## 2019-08-04 MED ORDER — DILTIAZEM HCL ER COATED BEADS 300 MG PO CP24
300.0000 mg | ORAL_CAPSULE | Freq: Every evening | ORAL | Status: DC
  Administered 2019-08-04: 300 mg via ORAL
  Filled 2019-08-04 (×2): qty 1

## 2019-08-04 MED ORDER — SODIUM CHLORIDE 0.9% FLUSH: 3.0000 mL | INTRAVENOUS | Status: DC | PRN

## 2019-08-04 MED ORDER — THROMBIN 5000 UNITS EX SOLR: OROMUCOSAL | Status: DC | PRN

## 2019-08-04 MED ORDER — PROPOFOL 10 MG/ML IV BOLUS
INTRAVENOUS | Status: DC | PRN
  Administered 2019-08-04: 100 mg via INTRAVENOUS

## 2019-08-04 MED ORDER — ROCURONIUM BROMIDE 10 MG/ML (PF) SYRINGE
PREFILLED_SYRINGE | INTRAVENOUS | Status: AC
  Filled 2019-08-04: qty 10

## 2019-08-04 MED ORDER — EPHEDRINE 5 MG/ML INJ
INTRAVENOUS | Status: AC
  Filled 2019-08-04: qty 10

## 2019-08-04 MED ORDER — POTASSIUM CHLORIDE IN NACL 20-0.9 MEQ/L-% IV SOLN: INTRAVENOUS | Status: DC

## 2019-08-04 MED ORDER — SODIUM CHLORIDE 0.9 % IV SOLN: INTRAVENOUS | Status: DC | PRN

## 2019-08-04 MED ORDER — SODIUM CHLORIDE 0.9% FLUSH: 3.0000 mL | Freq: Two times a day (BID) | INTRAVENOUS | Status: DC

## 2019-08-04 MED ORDER — ONDANSETRON HCL 4 MG/2ML IJ SOLN
4.0000 mg | Freq: Four times a day (QID) | INTRAMUSCULAR | Status: DC | PRN
  Administered 2019-08-04 (×2): 4 mg via INTRAVENOUS
  Filled 2019-08-04 (×2): qty 2

## 2019-08-04 MED ORDER — ROCURONIUM BROMIDE 10 MG/ML (PF) SYRINGE
PREFILLED_SYRINGE | INTRAVENOUS | Status: DC | PRN
  Administered 2019-08-04: 90 mg via INTRAVENOUS
  Administered 2019-08-04: 20 mg via INTRAVENOUS
  Administered 2019-08-04: 10 mg via INTRAVENOUS

## 2019-08-04 MED ORDER — SODIUM CHLORIDE 0.9 % IV SOLN: 250.0000 mL | INTRAVENOUS | Status: DC

## 2019-08-04 MED ORDER — PROPOFOL 10 MG/ML IV BOLUS
INTRAVENOUS | Status: AC
  Filled 2019-08-04: qty 20

## 2019-08-04 MED ORDER — ONDANSETRON HCL 4 MG/2ML IJ SOLN
INTRAMUSCULAR | Status: DC | PRN
  Administered 2019-08-04: 4 mg via INTRAVENOUS

## 2019-08-04 MED ORDER — ACETAMINOPHEN 325 MG PO TABS
650.0000 mg | ORAL_TABLET | ORAL | Status: DC | PRN
  Administered 2019-08-05: 650 mg via ORAL
  Filled 2019-08-04: qty 2

## 2019-08-04 MED ORDER — DEXTROSE 50 % IV SOLN
INTRAVENOUS | Status: DC | PRN
  Administered 2019-08-04: 12.5 g via INTRAVENOUS

## 2019-08-04 MED ORDER — SUGAMMADEX SODIUM 200 MG/2ML IV SOLN
INTRAVENOUS | Status: DC | PRN
  Administered 2019-08-04: 180 mg via INTRAVENOUS

## 2019-08-04 MED ORDER — MIDAZOLAM HCL 5 MG/5ML IJ SOLN
INTRAMUSCULAR | Status: DC | PRN
  Administered 2019-08-04: 1 mg via INTRAVENOUS

## 2019-08-04 MED ORDER — ACETAMINOPHEN 500 MG PO TABS
1000.0000 mg | ORAL_TABLET | Freq: Once | ORAL | Status: AC
  Administered 2019-08-04: 1000 mg via ORAL
  Filled 2019-08-04: qty 2

## 2019-08-04 MED ORDER — LISINOPRIL 20 MG PO TABS: 20.0000 mg | ORAL_TABLET | Freq: Every day | ORAL | Status: DC

## 2019-08-04 MED ORDER — SUFENTANIL CITRATE 50 MCG/ML IV SOLN
INTRAVENOUS | Status: AC
  Filled 2019-08-04: qty 1

## 2019-08-04 MED ORDER — THROMBIN 20000 UNITS EX SOLR: CUTANEOUS | Status: DC | PRN

## 2019-08-04 MED ORDER — CELECOXIB 200 MG PO CAPS
200.0000 mg | ORAL_CAPSULE | Freq: Two times a day (BID) | ORAL | Status: DC
  Administered 2019-08-04: 200 mg via ORAL
  Filled 2019-08-04: qty 1

## 2019-08-04 MED ORDER — MIDAZOLAM HCL 2 MG/2ML IJ SOLN
INTRAMUSCULAR | Status: AC
  Filled 2019-08-04: qty 2

## 2019-08-04 MED ORDER — PHENYLEPHRINE HCL (PRESSORS) 10 MG/ML IV SOLN
INTRAVENOUS | Status: AC
  Filled 2019-08-04: qty 2

## 2019-08-04 MED ORDER — ALBUMIN HUMAN 5 % IV SOLN: INTRAVENOUS | Status: DC | PRN

## 2019-08-04 MED ORDER — 0.9 % SODIUM CHLORIDE (POUR BTL) OPTIME
TOPICAL | Status: DC | PRN
  Administered 2019-08-04: 15:00:00 1000 mL

## 2019-08-04 MED ORDER — PHENYLEPHRINE 40 MCG/ML (10ML) SYRINGE FOR IV PUSH (FOR BLOOD PRESSURE SUPPORT)
PREFILLED_SYRINGE | INTRAVENOUS | Status: AC
  Filled 2019-08-04: qty 10

## 2019-08-04 MED ORDER — LIDOCAINE 2% (20 MG/ML) 5 ML SYRINGE
INTRAMUSCULAR | Status: DC | PRN
  Administered 2019-08-04: 60 mg via INTRAVENOUS

## 2019-08-04 MED ORDER — HEPARIN SODIUM (PORCINE) 1000 UNIT/ML IJ SOLN
INTRAMUSCULAR | Status: AC
  Filled 2019-08-04: qty 1

## 2019-08-04 MED ORDER — LACTATED RINGERS IV SOLN: INTRAVENOUS | Status: DC

## 2019-08-04 MED ORDER — LIDOCAINE 2% (20 MG/ML) 5 ML SYRINGE
INTRAMUSCULAR | Status: AC
  Filled 2019-08-04: qty 5

## 2019-08-04 SURGICAL SUPPLY — 63 items
BAG DECANTER FOR FLEXI CONT (MISCELLANEOUS) ×2 IMPLANT
BASKET BONE COLLECTION (BASKET) ×2 IMPLANT
BENZOIN TINCTURE PRP APPL 2/3 (GAUZE/BANDAGES/DRESSINGS) ×2 IMPLANT
BLADE CLIPPER SURG (BLADE) IMPLANT
BUR CARBIDE MATCH 3.0 (BURR) ×1 IMPLANT
CAGE POROUS ATEC 7X9X25 5D (Cage) ×2 IMPLANT
CANISTER SUCT 3000ML PPV (MISCELLANEOUS) ×2 IMPLANT
CARTRIDGE OIL MAESTRO DRILL (MISCELLANEOUS) ×1 IMPLANT
CONT SPEC 4OZ CLIKSEAL STRL BL (MISCELLANEOUS) ×2 IMPLANT
COVER BACK TABLE 60X90IN (DRAPES) ×2 IMPLANT
DERMABOND ADVANCED (GAUZE/BANDAGES/DRESSINGS) ×1
DERMABOND ADVANCED .7 DNX12 (GAUZE/BANDAGES/DRESSINGS) ×1 IMPLANT
DIFFUSER DRILL AIR PNEUMATIC (MISCELLANEOUS) ×2 IMPLANT
DRAPE C-ARM 42X72 X-RAY (DRAPES) ×2 IMPLANT
DRAPE C-ARMOR (DRAPES) ×2 IMPLANT
DRAPE LAPAROTOMY 100X72X124 (DRAPES) ×2 IMPLANT
DRAPE SURG 17X23 STRL (DRAPES) ×2 IMPLANT
DRSG OPSITE POSTOP 4X6 (GAUZE/BANDAGES/DRESSINGS) ×1 IMPLANT
DURAPREP 26ML APPLICATOR (WOUND CARE) ×2 IMPLANT
ELECT REM PT RETURN 9FT ADLT (ELECTROSURGICAL) ×2
ELECTRODE REM PT RTRN 9FT ADLT (ELECTROSURGICAL) ×1 IMPLANT
EVACUATOR 1/8 PVC DRAIN (DRAIN) ×2 IMPLANT
GAUZE 4X4 16PLY RFD (DISPOSABLE) IMPLANT
GLOVE BIO SURGEON STRL SZ7 (GLOVE) IMPLANT
GLOVE BIO SURGEON STRL SZ8 (GLOVE) ×4 IMPLANT
GLOVE BIOGEL PI IND STRL 7.0 (GLOVE) IMPLANT
GLOVE BIOGEL PI INDICATOR 7.0 (GLOVE)
GOWN STRL REUS W/ TWL LRG LVL3 (GOWN DISPOSABLE) IMPLANT
GOWN STRL REUS W/ TWL XL LVL3 (GOWN DISPOSABLE) ×2 IMPLANT
GOWN STRL REUS W/TWL 2XL LVL3 (GOWN DISPOSABLE) IMPLANT
GOWN STRL REUS W/TWL LRG LVL3 (GOWN DISPOSABLE)
GOWN STRL REUS W/TWL XL LVL3 (GOWN DISPOSABLE) ×2
HEMOSTAT POWDER KIT SURGIFOAM (HEMOSTASIS) ×2 IMPLANT
KIT BASIN OR (CUSTOM PROCEDURE TRAY) ×2 IMPLANT
KIT BONE MRW ASP ANGEL CPRP (KITS) ×1 IMPLANT
KIT TURNOVER KIT B (KITS) ×2 IMPLANT
MILL MEDIUM DISP (BLADE) IMPLANT
NDL HYPO 25X1 1.5 SAFETY (NEEDLE) ×1 IMPLANT
NEEDLE HYPO 25X1 1.5 SAFETY (NEEDLE) ×2 IMPLANT
NS IRRIG 1000ML POUR BTL (IV SOLUTION) ×2 IMPLANT
OIL CARTRIDGE MAESTRO DRILL (MISCELLANEOUS) ×2
PACK LAMINECTOMY NEURO (CUSTOM PROCEDURE TRAY) ×2 IMPLANT
PAD ARMBOARD 7.5X6 YLW CONV (MISCELLANEOUS) ×6 IMPLANT
PUTTY DBM ALLOSYNC PURE 10CC (Putty) ×1 IMPLANT
ROD LORD LIPPED TI 5.5X55 (Rod) ×2 IMPLANT
SCREW CANC SHANK MOD 5.5X45 (Screw) ×3 IMPLANT
SCREW POLYAXIAL TULIP (Screw) ×6 IMPLANT
SCREW SHANK MOD 5.5X40 (Screw) ×3 IMPLANT
SET SCREW (Screw) ×6 IMPLANT
SET SCREW SPNE (Screw) IMPLANT
SPACER IDENTITI 7X9X25 15D (Spacer) ×2 IMPLANT
SPONGE LAP 4X18 RFD (DISPOSABLE) IMPLANT
SPONGE SURGIFOAM ABS GEL 100 (HEMOSTASIS) ×2 IMPLANT
STRIP CLOSURE SKIN 1/2X4 (GAUZE/BANDAGES/DRESSINGS) ×3 IMPLANT
SUT VIC AB 0 CT1 18XCR BRD8 (SUTURE) ×1 IMPLANT
SUT VIC AB 0 CT1 8-18 (SUTURE) ×1
SUT VIC AB 2-0 CP2 18 (SUTURE) ×2 IMPLANT
SUT VIC AB 3-0 SH 8-18 (SUTURE) ×4 IMPLANT
SYR CONTROL 10ML LL (SYRINGE) ×2 IMPLANT
TOWEL GREEN STERILE (TOWEL DISPOSABLE) ×2 IMPLANT
TOWEL GREEN STERILE FF (TOWEL DISPOSABLE) ×2 IMPLANT
TRAY FOLEY MTR SLVR 16FR STAT (SET/KITS/TRAYS/PACK) ×2 IMPLANT
WATER STERILE IRR 1000ML POUR (IV SOLUTION) ×2 IMPLANT

## 2019-08-04 NOTE — Transfer of Care (Signed)
Immediate Anesthesia Transfer of Care Note  Patient: Caitlin [name]  Procedure(s) Performed: LUMBAR THREE-FOUR, LUMBAR FOUR-FIVE POSTERIOR LUMBAR INTERBODY FUSION (N/A Spine Lumbar)  Patient Location: PACU  Anesthesia Type:General  Level of Consciousness: drowsy  Airway & Oxygen Therapy: Patient Spontanous Breathing and Patient connected to face mask oxygen  Post-op Assessment: Report given to RN and Post -op Vital signs reviewed and stable  Post vital signs: Reviewed and stable  Last Vitals:  Vitals Value Taken Time  BP 167/82 08/04/19 1657  Temp    Pulse 110 08/04/19 1659  Resp 17 08/04/19 1659  SpO2 100 % 08/04/19 1659  Vitals shown include unvalidated device data.  Last Pain:  Vitals:   08/04/19 1032  TempSrc:   PainSc: 9       Patients Stated Pain Goal: 2 ([id number])  Complications: No apparent anesthesia complications

## 2019-08-04 NOTE — H&P (Signed)
Subjective: Patient is a 73 y.o. female admitted for lumbar stenosis. Onset of symptoms was several months ago, gradually worsening since that time.  The pain is rated severe, and is located at the across the lower back and radiates to LLE. The pain is described as burning and occurs all day. The symptoms have been progressive. Symptoms are exacerbated by exercise. MRI or CT showed severe stenosis with instability   Past Medical History:  Diagnosis Date  . Arthritis   . Asthma   . Atrial fibrillation ([place])   . Complication of anesthesia   . COPD (chronic obstructive pulmonary disease) ([place])   . Depression   . Diabetes mellitus without complication ([place])    Type II  . Dysrhythmia   . GERD (gastroesophageal reflux disease)   . HTN (hypertension)   . Hypercholesteremia   . Obstructive sleep apnea   . PONV (postoperative nausea and vomiting)    "not the last few times"  . RLS (restless legs syndrome)   . Sleep apnea     Past Surgical History:  Procedure Laterality Date  . ATRIAL FIBRILLATION ABLATION     x3  . COLONOSCOPY  01/04/2013   Moderate predominantly sigmoid diverticulosis. Small internal hemorrhoids. Otherwise normal colonosopy.     Prior to Admission medications   Medication Sig Start Date End Date Taking? Authorizing Provider  calcium carbonate (TUMS) 500 MG chewable tablet Chew 2 tablets by mouth daily as needed for indigestion.    Yes [provider]  diltiazem (TIAZAC) 300 MG 24 hr capsule Take 300 mg by mouth every evening. 05/04/19  Yes [provider]  DULoxetine (CYMBALTA) 60 MG capsule Take 60 mg by mouth every evening.    Yes [provider]  fexofenadine (ALLEGRA) 180 MG tablet Take 180 mg by mouth daily as needed for allergies or rhinitis.   Yes [provider]  lisinopril (ZESTRIL) 20 MG tablet Take 20 mg by mouth daily.   Yes [provider]  Misc Natural Products (LUTEIN 20) CAPS Take 20 mg by mouth every evening.    Yes [provider]  naproxen sodium (ALEVE) 220 MG tablet Take 660 mg by mouth 2 (two) times daily as needed (pain).   Yes [provider]  Olopatadine HCl (PATADAY OP) Place 1 drop into both eyes daily.   Yes [provider]  omeprazole (PRILOSEC) 40 MG capsule Take 40 mg by mouth 2 (two) times daily.  04/27/18  Yes [provider]  Polyethyl Glycol-Propyl Glycol (SYSTANE ULTRA OP) Place 1 drop into both eyes 4 (four) times daily.   Yes [provider]  Potassium Gluconate 550 (90 K) MG TABS Take 550 mg by mouth daily as needed (cramps).   Yes [provider]  rOPINIRole (REQUIP) 2 MG tablet Take 2 mg by mouth 2 (two) times daily.  07/22/18  Yes [provider]  Semaglutide,0.25 or 0.5MG /DOS, 2 MG/1.5ML SOPN Inject 0.25 mg into the skin every Monday.  01/30/18  Yes [provider]  TRESIBA FLEXTOUCH 200 UNIT/ML SOPN Inject 80 Units into the skin every evening.  10/07/18  Yes [provider]  VITAMIN D-VITAMIN K PO Take 1 tablet by mouth every evening.   Yes [provider]  XARELTO 20 MG TABS tablet Take 20 mg by mouth every evening.  04/27/18  Yes [provider]  albuterol (PROVENTIL) (2.5 MG/3ML) 0.083% nebulizer solution Take 2.5 mg by nebulization every 6 (six) hours as needed for wheezing or shortness of breath.  [provider]  Insulin Pen Needle (FIFTY50 PEN NEEDLES) 31G X 8 MM MISC Inject 1 Units into the skin daily. 01/07/18   [provider]   Allergies  Allergen Reactions  . Codeine Nausea Only    Social History   Tobacco Use  . Smoking status: Never Smoker  . Smokeless tobacco: Never Used  Substance Use Topics  . Alcohol use: Not Currently    Family History  Problem Relation Age of Onset  . Colon cancer Paternal Aunt 42     Review of Systems  Positive ROS: neg  All other systems have been reviewed and were otherwise negative with the exception of those  mentioned in the HPI and as above.  Objective: Vital signs in last 24 hours: Temp:  [97.8 F (36.6 C)] 97.8 F (36.6 C) (12/16 0913) Pulse Rate:  [86] 86 (12/16 0913) Resp:  [18] 18 (12/16 0913) BP: (190-205)/(97-101) 190/101 (12/16 1021) SpO2:  [100 %] 100 % (12/16 0913) Weight:  [86.2 kg] 86.2 kg (12/16 1032)  General Appearance: Alert, cooperative, no distress, appears stated age Head: Normocephalic, without obvious abnormality, atraumatic Eyes: PERRL, conjunctiva/corneas clear, EOM's intact    Neck: Supple, symmetrical, trachea midline Back: Symmetric, no curvature, ROM normal, no CVA tenderness Lungs:  respirations unlabored Heart: Regular rate and rhythm Abdomen: Soft, non-tender Extremities: Extremities normal, atraumatic, no cyanosis or edema Pulses: 2+ and symmetric all extremities Skin: Skin color, texture, turgor normal, no rashes or lesions  NEUROLOGIC:   Mental status: Alert and oriented x4,  no aphasia, good attention span, fund of knowledge, and memory Motor Exam - grossly normal Sensory Exam - grossly normal Reflexes: trace Coordination - grossly normal Gait - grossly normal Balance - grossly normal Cranial Nerves: I: smell Not tested  II: visual acuity  OS: nl    OD: nl  II: visual fields Full to confrontation  II: pupils Equal, round, reactive to light  III,VII: ptosis None  III,IV,VI: extraocular muscles  Full ROM  V: mastication Normal  V: facial light touch sensation  Normal  V,VII: corneal reflex  Present  VII: facial muscle function - upper  Normal  VII: facial muscle function - lower Normal  VIII: hearing Not tested  IX: soft palate elevation  Normal  IX,X: gag reflex Present  XI: trapezius strength  5/5  XI: sternocleidomastoid strength 5/5  XI: neck flexion strength  5/5  XII: tongue strength  Normal    Data Review Lab Results  Component Value Date   WBC 7.6 08/04/2019   HGB 13.2 08/04/2019   HCT 41.1 08/04/2019   MCV 89.7  08/04/2019   PLT 274 08/04/2019   Lab Results  Component Value Date   NA 140 08/04/2019   K 4.6 08/04/2019   CL 104 08/04/2019   CO2 26 08/04/2019   BUN 18 08/04/2019   CREATININE 0.72 08/04/2019   GLUCOSE 84 08/04/2019   Lab Results  Component Value Date   INR 0.9 08/04/2019    Assessment/Plan:  Estimated body mass index is 33.66 kg/m as calculated from the following:   Height as of this encounter: 5\' 3"  (1.6 m).   Weight as of this encounter: 86.2 kg. Patient admitted for PLIF L3-4 L4-5. Patient has failed a reasonable attempt at conservative therapy.  I explained the condition and procedure to the patient and answered any questions.  Patient wishes to proceed with procedure as planned. Understands risks/ benefits and typical outcomes of procedure.   Eustace Moore 08/04/2019 12:57  PM

## 2019-08-04 NOTE — Anesthesia Procedure Notes (Addendum)
Procedure Name: Intubation Date/Time: 08/04/2019 1:12 PM Performed by: Moshe Salisbury, CRNA Pre-anesthesia Checklist: Patient identified, Emergency Drugs available, Suction available and Patient being monitored Patient Re-evaluated:Patient Re-evaluated prior to induction Oxygen Delivery Method: Circle System Utilized Preoxygenation: Pre-oxygenation with 100% oxygen Induction Type: IV induction Ventilation: Mask ventilation with difficulty Laryngoscope Size: Mac and 3 Grade View: Grade II Tube type: Oral Tube size: 7.5 mm Number of attempts: 1 Airway Equipment and Method: Stylet Placement Confirmation: ETT inserted through vocal cords under direct vision,  positive ETCO2 and breath sounds checked- equal and bilateral Secured at: 20 cm Tube secured with: Tape Dental Injury: Teeth and Oropharynx as per pre-operative assessment

## 2019-08-04 NOTE — Progress Notes (Addendum)
Pt's CBG 56 @ 1115.    Gave D50 25 ml per protocol.  Will recheck in 15 minutes.  New CBG @ 1135 was 139.  Dr. Lanetta Inch notified.

## 2019-08-04 NOTE — Progress Notes (Signed)
Orthopedic Tech Progress Note Patient Details:  Caitlin [name] 06/26/1946 [id number] Pt already has brace.         Ladell Pier [facility] 08/04/2019, 9:22 PM

## 2019-08-04 NOTE — Op Note (Signed)
08/04/2019  4:49 PM  PATIENT:  Caitlin [name]  73 y.o. 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 Decompressive lumbar laminectomy L3-4 and L4-5 requiring more work than would be required for a simple exposure of the disk for PLIF in order to adequately decompress the neural elements and address the spinal stenosis 2. Posterior lumbar interbody fusion L3-4 and L4-5 using porous titanium interbody cages packed with morcellized allograft and autograft soaked with a bone marrow aspirate obtained through a separate fascial incision over the right iliac crest 3. Posterior fixation L3-5 inclusive using Alphatec cortical pedicle screws.  4. Intertransverse arthrodesis L3-5 using morcellized autograft and allograft.  SURGEON:  Sherley Bounds, MD  ASSISTANTS: Glenford Peers, FNP  ANESTHESIA:  General  EBL: 500 ml  Total I/O In: 1800 [I.V.:1300; IV Piggyback:500] Out: 660 [Urine:160; Blood:500]  BLOOD ADMINISTERED:none  DRAINS: none   INDICATION FOR PROCEDURE: This patient presented with near progressive chronic back and leg pain. Imaging revealed severe spondylosis and stenosis at L3-4 and L4-5 with a grade 2 spondylolisthesis at L4-5. The patient tried a reasonable attempt at conservative medical measures without relief. I recommended decompression and instrumented fusion to address the stenosis as well as the segmental  instability.  Patient understood the risks, benefits, and alternatives and potential outcomes and wished to proceed.  PROCEDURE DETAILS:  The patient was brought to the operating room. After induction of generalized endotracheal anesthesia the patient was rolled into the prone position on chest rolls and all pressure points were padded. The patient's lumbar region was cleaned and then prepped with DuraPrep and  draped in the usual sterile fashion. Anesthesia was injected and then a dorsal midline incision was made and carried down to the lumbosacral fascia. The fascia was opened and the paraspinous musculature was taken down in a subperiosteal fashion to expose L3-L5. A self-retaining retractor was placed. Intraoperative fluoroscopy confirmed my level, and I started with placement of the L3 cortical pedicle screws. The pedicle screw entry zones were identified utilizing surface landmarks and  AP and lateral fluoroscopy. I scored the cortex with the high-speed drill and then used the hand drill to drill an upward and outward direction into the pedicle. I then tapped line to line. I then placed a 5.5 x 45 mm cortical pedicle screw into the pedicles of L3 bilaterally.  I then dissected in a suprafascial plane to expose the iliac crest.  Opened the fascia and we used a Jamshidi needle to extract 60 cc of bone marrow aspirate from the iliac crest with the assistance of my nurse practitioner.  This was then spun down by [facility] device and 2 to 4 cc of  BMAC was soaked on morselized allograft for later arthrodesis.  I dried the hole with Surgifoam and closed the fascia.  I then turned my attention to the decompression and complete lumbar laminectomies, hemi- facetectomies, and foraminotomies were performed at L3-4 and L4-5.  My nurse practitioner was directly involved in the decompression and exposure of the neural elements. the patient had significant spinal stenosis and this required more work than would be required for a simple exposure of the disc for posterior lumbar interbody fusion which would only require a limited laminotomy. Much more generous decompression and generous foraminotomy was undertaken in order to adequately decompress the neural elements and address the patient's leg pain. The yellow ligament  was removed to expose the underlying dura and nerve roots, and generous foraminotomies were performed to adequately  decompress the neural elements. Both the exiting and traversing nerve roots were decompressed on both sides until a coronary dilator passed easily along the nerve roots. Once the decompression was complete, I turned my attention to the posterior lower lumbar interbody fusion. The epidural venous vasculature was coagulated and cut sharply. Disc space was incised and the initial discectomy was performed with pituitary rongeurs. The disc space was distracted with sequential distractors to a height of 8 mm. We then used a series of scrapers and shavers to prepare the endplates for fusion. The midline was prepared with Epstein curettes. Once the complete discectomy was finished, we packed an appropriate sized interbody cage with local autograft and morcellized allograft, gently retracted the nerve root, and tapped the cage into position at L3-4 and L4-5.  The midline between the cages was packed with morselized autograft and allograft. We then turned our attention to the placement of the lower pedicle screws. The pedicle screw entry zones were identified utilizing surface landmarks and fluoroscopy. I drilled into each pedicle utilizing the hand drill, and tapped each pedicle with the appropriate tap. We palpated with a ball probe to assure no break in the cortex. We then placed 5.5 x 45 mm cortical pedicle screws into the pedicles bilaterally at L4 and L5.  My nurse practitioner assisted in placement of the pedicle screws.  We then decorticated the transverse processes and laid a mixture of morcellized autograft and allograft out over these to perform intertransverse arthrodesis at L3-L5. We then placed lordotic rods into the multiaxial screw heads of the pedicle screws and locked these in position with the locking caps and anti-torque device. We then checked our construct with AP and lateral fluoroscopy. Irrigated with copious amounts of bacitracin-containing saline solution. Inspected the nerve roots once again to  assure adequate decompression, lined to the dura with Gelfoam, placed powdered vancomycin into the wound, and then we closed the muscle and the fascia with 0 Vicryl. Closed the subcutaneous tissues with 2-0 Vicryl and subcuticular tissues with 3-0 Vicryl. The skin was closed with benzoin and Steri-Strips. Dressing was then applied, the patient was awakened from general anesthesia and transported to the recovery room in stable condition. At the end of the procedure all sponge, needle and instrument counts were correct.   PLAN OF CARE: admit to inpatient  PATIENT DISPOSITION:  PACU - hemodynamically stable.   Delay start of Pharmacological VTE agent (>24hrs) due to surgical blood loss or risk of bleeding:  yes

## 2019-08-04 NOTE — Progress Notes (Signed)
Pt's blood pressure elevated.   Pt stated she has not taken her Diltiazem in about a week, because she ran out of her medication.  Dr. Lanetta Inch notified.  No new orders given.

## 2019-08-05 LAB — POCT I-STAT, CHEM 8
BUN: 18 mg/dL (ref 8–23)
Calcium, Ion: 1.25 mmol/L (ref 1.15–1.40)
Chloride: 104 mmol/L (ref 98–111)
Creatinine, Ser: 0.8 mg/dL (ref 0.44–1.00)
Glucose, Bld: 122 mg/dL — ABNORMAL HIGH (ref 70–99)
HCT: 33 % — ABNORMAL LOW (ref 36.0–46.0)
Hemoglobin: 11.2 g/dL — ABNORMAL LOW (ref 12.0–15.0)
Potassium: 4.1 mmol/L (ref 3.5–5.1)
Sodium: 140 mmol/L (ref 135–145)
TCO2: 27 mmol/L (ref 22–32)

## 2019-08-05 LAB — GLUCOSE, CAPILLARY
Glucose-Capillary: 51 mg/dL — ABNORMAL LOW (ref 70–99)
Glucose-Capillary: 85 mg/dL (ref 70–99)
Glucose-Capillary: 85 mg/dL (ref 70–99)
Glucose-Capillary: 98 mg/dL (ref 70–99)
Glucose-Capillary: 182 mg/dL — ABNORMAL HIGH (ref 70–99)

## 2019-08-05 LAB — HEMOGLOBIN A1C
Hgb A1c MFr Bld: 7.2 % — ABNORMAL HIGH (ref 4.8–5.6)
Mean Plasma Glucose: 159.94 mg/dL

## 2019-08-05 MED ORDER — HYDROXYZINE HCL 50 MG/ML IM SOLN
50.0000 mg | Freq: Four times a day (QID) | INTRAMUSCULAR | Status: DC | PRN
  Administered 2019-08-05: 50 mg via INTRAMUSCULAR
  Filled 2019-08-05: qty 1

## 2019-08-05 MED ORDER — OXYCODONE HCL 5 MG PO TABS: 5.0000 mg | ORAL_TABLET | ORAL | 0 refills | Status: DC | PRN

## 2019-08-05 MED ORDER — TIZANIDINE HCL 2 MG PO TABS: 2.0000 mg | ORAL_TABLET | Freq: Four times a day (QID) | ORAL | 1 refills | Status: DC | PRN

## 2019-08-05 MED FILL — Thrombin For Soln 5000 Unit: CUTANEOUS | Qty: 5000 | Status: AC

## 2019-08-05 MED FILL — Gelatin Absorbable MT Powder: OROMUCOSAL | Qty: 1 | Status: AC

## 2019-08-05 NOTE — Evaluation (Signed)
Physical Therapy Evaluation Patient Details Name: Caitlin [name] MRN: [id number] DOB: January 20, 1946 Today's Date: 08/05/2019   History of Present Illness  Pt is a 73 y/o female now s/p PLIF L3-5. PMHx includes Afib, HTN, COPD, depression, DM, asthma  Clinical Impression  Pt admitted with above diagnosis. At the time of PT eval, pt was able to demonstrate transfers and ambulation with gross min guard assist without an AD (occasional HHA). Pt reports feeling the same pain in her L hip and thigh that she was feeling prior to surgery and appeared discouraged by this at times. As activity progressed, pain reportedly increased and HHA was required by end of session. Pt was educated on precautions, brace application/wearing schedule, appropriate activity progression, and car transfer. Pt currently with functional limitations due to the deficits listed below (see PT Problem List). Pt will benefit from skilled PT to increase their independence and safety with mobility to allow discharge to the venue listed below.      Follow Up Recommendations No PT follow up;Supervision for mobility/OOB    Equipment Recommendations  None recommended by PT    Recommendations for Other Services       Precautions / Restrictions Precautions Precautions: Fall;Back Precaution Booklet Issued: Yes (comment) Precaution Comments: issued and reviewed throughout functional tasks Required Braces or Orthoses: Spinal Brace Spinal Brace: Lumbar corset;Applied in sitting position Restrictions Weight Bearing Restrictions: No      Mobility  Bed Mobility Overal bed mobility: Needs Assistance Bed Mobility: Rolling;Sidelying to Sit Rolling: Min guard Sidelying to sit: Min guard       General bed mobility comments: No assist required however close guard provided for technique and safety. VC's throughout for log roll.   Transfers Overall transfer level: Needs assistance Equipment used: None Transfers: Sit to/from  Stand Sit to Stand: Supervision         General transfer comment: supervision for safety and balance  Ambulation/Gait Ambulation/Gait assistance: Min guard Gait Distance (Feet): 200 Feet Assistive device: 1 person hand held assist;None Gait Pattern/deviations: Step-through pattern;Decreased stride length;Trunk flexed Gait velocity: Decreased Gait velocity interpretation: <1.8 ft/sec, indicate of risk for recurrent falls General Gait Details: VC's for improved posture. Initially ambulating without assist, however after stair training and increased pain in L hip, pt required HHA for balance support and safety on the way back to the room.   Stairs            Wheelchair Mobility    Modified Rankin (Stroke Patients Only)       Balance Overall balance assessment: Needs assistance Sitting-balance support: Feet supported Sitting balance-Leahy Scale: Good     Standing balance support: No upper extremity supported;Single extremity supported;During functional activity Standing balance-Leahy Scale: Fair Standing balance comment: pt utilizing single UE support when available for increased stability, able to maintain standing balance without UE support and supervision                             Pertinent Vitals/Pain Pain Assessment: Faces Faces Pain Scale: Hurts even more Pain Location: back, incisional Pain Descriptors / Indicators: Discomfort;Grimacing;Guarding;Sore Pain Intervention(s): Limited activity within patient's tolerance;Monitored during session;Repositioned    Home Living Family/patient expects to be discharged to:: Private residence Living Arrangements: Spouse/significant other Available Help at Discharge: Family;Available 24 hours/day Type of Home: House Home Access: Stairs to enter Entrance Stairs-Rails: Right Entrance Stairs-Number of Steps: 3 Home Layout: One level Home Equipment: None      Prior Function  Level of Independence: Independent                Hand Dominance   Dominant Hand: Right    Extremity/Trunk Assessment   Upper Extremity Assessment Upper Extremity Assessment: Defer to OT evaluation    Lower Extremity Assessment Lower Extremity Assessment: LLE deficits/detail LLE Deficits / Details: Pt reports Pain in hip and into leg similar to pain pt had prior to surgery    Cervical / Trunk Assessment Cervical / Trunk Assessment: Other exceptions Cervical / Trunk Exceptions: s/p lumbar surgery  Communication   Communication: No difficulties  Cognition Arousal/Alertness: Awake/alert Behavior During Therapy: WFL for tasks assessed/performed Overall Cognitive Status: Within Functional Limits for tasks assessed                                 General Comments: overall WFL however pt intermittently keeping eyes closed due to general not feeling well      General Comments General comments (skin integrity, edema, etc.): nauseated throughout session    Exercises     Assessment/Plan    PT Assessment Patient needs continued PT services  PT Problem List Decreased strength;Decreased activity tolerance;Decreased balance;Decreased mobility;Decreased knowledge of use of DME;Decreased safety awareness;Decreased knowledge of precautions;Pain       PT Treatment Interventions DME instruction;Gait training;Stair training;Functional mobility training;Therapeutic activities;Therapeutic exercise;Neuromuscular re-education;Patient/family education    PT Goals (Current goals can be found in the Care Plan section)  Acute Rehab PT Goals Patient Stated Goal: to feel better, no more n/v PT Goal Formulation: With patient Time For Goal Achievement: 08/12/19 Potential to Achieve Goals: Good    Frequency Min 5X/week   Barriers to discharge        Co-evaluation               AM-PAC PT "6 Clicks" Mobility  Outcome Measure Help needed turning from your back to your side while in a flat bed without  using bedrails?: A Little Help needed moving from lying on your back to sitting on the side of a flat bed without using bedrails?: A Little Help needed moving to and from a bed to a chair (including a wheelchair)?: A Little Help needed standing up from a chair using your arms (e.g., wheelchair or bedside chair)?: A Little Help needed to walk in hospital room?: A Little Help needed climbing 3-5 steps with a railing? : A Little 6 Click Score: 18    End of Session Equipment Utilized During Treatment: Gait belt;Back brace Activity Tolerance: Patient limited by pain(and nausea) Patient left: in chair;with call bell/phone within reach Nurse Communication: Mobility status PT Visit Diagnosis: Unsteadiness on feet (R26.81);Pain Pain - Right/Left: Left Pain - part of body: Hip;Leg(back)    Time: [id number] PT Time Calculation (min) (ACUTE ONLY): 28 min   Charges:   PT Evaluation $PT Eval Moderate Complexity: 1 Mod PT Treatments $Gait Training: 8-22 mins        Rolinda Roan, PT, DPT Acute Rehabilitation Services Pager: [phone] Office: [phone]   Thelma Comp 08/05/2019, 12:58 PM

## 2019-08-05 NOTE — Anesthesia Postprocedure Evaluation (Signed)
Anesthesia Post Note  Patient: Cortnie Distler  Procedure(s) Performed: LUMBAR THREE-FOUR, LUMBAR FOUR-FIVE POSTERIOR LUMBAR INTERBODY FUSION (N/A Spine Lumbar)     Patient location during evaluation: PACU Anesthesia Type: General Level of consciousness: awake and alert Pain management: pain level controlled Vital Signs Assessment: post-procedure vital signs reviewed and stable Respiratory status: spontaneous breathing, nonlabored ventilation, respiratory function stable and patient connected to nasal cannula oxygen Cardiovascular status: blood pressure returned to baseline and stable Postop Assessment: no apparent nausea or vomiting Anesthetic complications: no    Last Vitals:  Vitals:   08/05/19 0351 08/05/19 0736  BP: (!) 128/58 129/66  Pulse: 85 97  Resp: 20 20  Temp: 36.4 C 36.7 C  SpO2: 100% 98%    Last Pain:  Vitals:   08/05/19 0845  TempSrc:   PainSc: 5                  Adyn Serna

## 2019-08-05 NOTE — Discharge Summary (Signed)
Physician Discharge Summary  Patient ID: Caitlin [name] MRN: [id number] DOB/AGE: 1946-03-29 73 y.o.  Admit date: 08/04/2019 Discharge date: 08/05/2019  Admission Diagnoses: spondylolisthesis with stenosis lumbar    Discharge Diagnoses: same   Discharged Condition: good  Hospital Course: The patient was admitted on 08/04/2019 and taken to the operating room where the patient underwent PLIF L3-5. The patient tolerated the procedure well and was taken to the recovery room and then to the floor in stable condition. The hospital course was routine. There were no complications. The wound remained clean dry and intact. Pt had appropriate back soreness. No complaints of leg pain or new N/T/W. The patient remained afebrile with stable vital signs, and tolerated a regular diet. The patient continued to increase activities, and pain was well controlled with oral pain medications.   Consults: None  Significant Diagnostic Studies:  Results for orders placed or performed during the hospital encounter of 08/04/19  Surgical pcr screen   Specimen: Nasal Mucosa; Nasal Swab  Result Value Ref Range   MRSA, PCR NEGATIVE NEGATIVE   Staphylococcus aureus NEGATIVE NEGATIVE  Basic metabolic panel  Result Value Ref Range   Sodium 140 135 - 145 mmol/L   Potassium 4.6 3.5 - 5.1 mmol/L   Chloride 104 98 - 111 mmol/L   CO2 26 22 - 32 mmol/L   Glucose, Bld 84 70 - 99 mg/dL   BUN 18 8 - 23 mg/dL   Creatinine, Ser 0.72 0.44 - 1.00 mg/dL   Calcium 9.2 8.9 - 10.3 mg/dL   GFR calc non Af Amer >60 >60 mL/min   GFR calc Af Amer >60 >60 mL/min   Anion gap 10 5 - 15  CBC WITH DIFFERENTIAL  Result Value Ref Range   WBC 7.6 4.0 - 10.5 K/uL   RBC 4.58 3.87 - 5.11 MIL/uL   Hemoglobin 13.2 12.0 - 15.0 g/dL   HCT 41.1 36.0 - 46.0 %   MCV 89.7 80.0 - 100.0 fL   MCH 28.8 26.0 - 34.0 pg   MCHC 32.1 30.0 - 36.0 g/dL   RDW 13.5 11.5 - 15.5 %   Platelets 274 150 - 400 K/uL   nRBC 0.0 0.0 - 0.2 %   Neutrophils Relative  % 57 %   Neutro Abs 4.4 1.7 - 7.7 K/uL   Lymphocytes Relative 30 %   Lymphs Abs 2.2 0.7 - 4.0 K/uL   Monocytes Relative 8 %   Monocytes Absolute 0.6 0.1 - 1.0 K/uL   Eosinophils Relative 4 %   Eosinophils Absolute 0.3 0.0 - 0.5 K/uL   Basophils Relative 1 %   Basophils Absolute 0.0 0.0 - 0.1 K/uL   Immature Granulocytes 0 %   Abs Immature Granulocytes 0.02 0.00 - 0.07 K/uL  Protime-INR  Result Value Ref Range   Prothrombin Time 12.5 11.4 - 15.2 seconds   INR 0.9 0.8 - 1.2  Glucose, capillary  Result Value Ref Range   Glucose-Capillary 77 70 - 99 mg/dL   Comment 1 Notify RN    Comment 2 Document in Chart   Glucose, capillary  Result Value Ref Range   Glucose-Capillary 139 (H) 70 - 99 mg/dL  Glucose, capillary  Result Value Ref Range   Glucose-Capillary 56 (L) 70 - 99 mg/dL  Glucose, capillary  Result Value Ref Range   Glucose-Capillary 114 (H) 70 - 99 mg/dL  Hemoglobin A1c  Result Value Ref Range   Hgb A1c MFr Bld 7.2 (H) 4.8 - 5.6 %   Mean Plasma  Glucose 159.94 mg/dL  Glucose, capillary  Result Value Ref Range   Glucose-Capillary 173 (H) 70 - 99 mg/dL   Comment 1 Notify RN    Comment 2 Document in Chart   Glucose, capillary  Result Value Ref Range   Glucose-Capillary 182 (H) 70 - 99 mg/dL   Comment 1 Notify RN    Comment 2 Document in Chart   Type and screen [place]  Result Value Ref Range   ABO/RH(D) O POS    Antibody Screen NEG    Sample Expiration      08/07/2019,2359 Performed at [facility] [address] [place], [place] [address]   ABO/Rh  Result Value Ref Range   ABO/RH(D)      O POS Performed at [facility] [address] [place], [place] [address]     Chest 2 View  Result Date: 08/04/2019 CLINICAL DATA:  73 year old female with pending lumbar surgery EXAM: CHEST - 2 VIEW COMPARISON:  08/06/2018 FINDINGS: Cardiomediastinal silhouette within normal limits in size and contour. No evidence of central vascular  congestion. No pneumothorax or pleural effusion. No confluent airspace disease. Coarsened interstitial markings similar to the prior. Degenerative changes of the spine IMPRESSION: Negative for acute cardiopulmonary disease Electronically Signed   By: Corrie Mckusick D.O.   On: 08/04/2019 11:19   DG Lumbar Spine 2-3 Views  Result Date: 08/04/2019 CLINICAL DATA:  73 year old female with lumbar spine surgery EXAM: LUMBAR SPINE - 2-3 VIEW; DG C-ARM 1-60 MIN COMPARISON:  None. FINDINGS: Limited intraoperative fluoroscopic spot images of lumbar spine surgery. Surgical changes of posterior lumbar interbody fusion with bilateral pedicle screw and rod fixation spanning L3-L5 with interbody spacer placement. Mild anterolisthesis is present at the L4-L5 level with the spacers centered at the disc spaces. No hardware fracture. IMPRESSION: Limited intraoperative fluoroscopic spot images of L3-L5 PLIF, as above. Please refer to the dictated operative report for full details of intraoperative findings and procedure. Electronically Signed   By: Corrie Mckusick D.O.   On: 08/04/2019 17:08   DG C-Arm 1-60 Min  Result Date: 08/04/2019 CLINICAL DATA:  73 year old female with lumbar spine surgery EXAM: LUMBAR SPINE - 2-3 VIEW; DG C-ARM 1-60 MIN COMPARISON:  None. FINDINGS: Limited intraoperative fluoroscopic spot images of lumbar spine surgery. Surgical changes of posterior lumbar interbody fusion with bilateral pedicle screw and rod fixation spanning L3-L5 with interbody spacer placement. Mild anterolisthesis is present at the L4-L5 level with the spacers centered at the disc spaces. No hardware fracture. IMPRESSION: Limited intraoperative fluoroscopic spot images of L3-L5 PLIF, as above. Please refer to the dictated operative report for full details of intraoperative findings and procedure. Electronically Signed   By: Corrie Mckusick D.O.   On: 08/04/2019 17:08    Antibiotics:  Anti-infectives (From admission, onward)   Start      Dose/Rate Route Frequency Ordered Stop   08/04/19 2200  ceFAZolin (ANCEF) IVPB 2g/100 mL premix     2 g 200 mL/hr over 30 Minutes Intravenous Every 8 hours 08/04/19 1937 08/05/19 0447   08/04/19 1504  bacitracin 50,000 Units in sodium chloride 0.9 % 500 mL irrigation  Status:  Discontinued       As needed 08/04/19 1504 08/04/19 1652   08/04/19 0915  ceFAZolin (ANCEF) IVPB 2g/100 mL premix     2 g 200 mL/hr over 30 Minutes Intravenous On call to O.R. 08/04/19 0903 08/04/19 1301      Discharge Exam: Blood pressure 129/66,  pulse 97, temperature 98.1 F (36.7 C), temperature source Oral, resp. rate 20, height 5\' 3"  (1.6 m), weight 86.2 kg, SpO2 98 %. Neurologic: Grossly normal Dressing dry  Discharge Medications:   Allergies as of 08/05/2019      Reactions   Codeine Nausea Only      Medication List    STOP taking these medications   naproxen sodium 220 MG tablet Commonly known as: ALEVE     TAKE these medications   albuterol (2.5 MG/3ML) 0.083% nebulizer solution Commonly known as: PROVENTIL Take 2.5 mg by nebulization every 6 (six) hours as needed for wheezing or shortness of breath.   diltiazem 300 MG 24 hr capsule Commonly known as: TIAZAC Take 300 mg by mouth every evening.   DULoxetine 60 MG capsule Commonly known as: CYMBALTA Take 60 mg by mouth every evening.   fexofenadine 180 MG tablet Commonly known as: ALLEGRA Take 180 mg by mouth daily as needed for allergies or rhinitis.   Fifty50 Pen Needles 31G X 8 MM Misc Generic drug: Insulin Pen Needle Inject 1 Units into the skin daily.   lisinopril 20 MG tablet Commonly known as: ZESTRIL Take 20 mg by mouth daily.   Lutein 20 Caps Take 20 mg by mouth every evening.   omeprazole 40 MG capsule Commonly known as: PRILOSEC Take 40 mg by mouth 2 (two) times daily.   oxyCODONE 5 MG immediate release tablet Commonly known as: Oxy IR/ROXICODONE Take 1 tablet (5 mg total) by mouth every 4 (four) hours as  needed for moderate pain ((score 4 to 6)).   PATADAY OP Place 1 drop into both eyes daily.   Potassium Gluconate 550 (90 K) MG Tabs Take 550 mg by mouth daily as needed (cramps).   rOPINIRole 2 MG tablet Commonly known as: REQUIP Take 2 mg by mouth 2 (two) times daily.   Semaglutide(0.25 or 0.5MG /DOS) 2 MG/1.5ML Sopn Inject 0.25 mg into the skin every Monday.   SYSTANE ULTRA OP Place 1 drop into both eyes 4 (four) times daily.   tiZANidine 2 MG tablet Commonly known as: ZANAFLEX Take 1 tablet (2 mg total) by mouth every 6 (six) hours as needed.   Tyler Aas FlexTouch 200 UNIT/ML Sopn Generic drug: Insulin Degludec Inject 80 Units into the skin every evening.   Tums 500 MG chewable tablet Generic drug: calcium carbonate Chew 2 tablets by mouth daily as needed for indigestion.   VITAMIN D-VITAMIN K PO Take 1 tablet by mouth every evening.   Xarelto 20 MG Tabs tablet Generic drug: rivaroxaban Take 20 mg by mouth every evening.            Durable Medical Equipment  (From admission, onward)         Start     Ordered   08/04/19 1938  DME Walker rolling  Once    Question:  Patient needs a walker to treat with the following condition  Answer:  S/P lumbar fusion   08/04/19 1937   08/04/19 1938  DME 3 n 1  Once     08/04/19 1937          Disposition: home   Final Dx: PLIF L3-4 L4-5  Discharge Instructions     Remove dressing in 72 hours   Complete by: As directed    Call MD for:  difficulty breathing, headache or visual disturbances   Complete by: As directed    Call MD for:  persistant nausea and vomiting   Complete by: As  directed    Call MD for:  redness, tenderness, or signs of infection (pain, swelling, redness, odor or green/yellow discharge around incision site)   Complete by: As directed    Call MD for:  severe uncontrolled pain   Complete by: As directed    Call MD for:  temperature >100.4   Complete by: As directed    Diet - low sodium heart  healthy   Complete by: As directed    Increase activity slowly   Complete by: As directed       Follow-up Information    Eustace Moore, MD. Schedule an appointment as soon as possible for a visit in 2 week(s).   Specialty: Neurosurgery Contact information: [address] [place] [address] [place] [address] [phone]            Signed: Eustace Moore 08/05/2019, 7:57 AM

## 2019-08-05 NOTE — Discharge Instructions (Signed)

## 2019-08-05 NOTE — Progress Notes (Signed)
Pt given D/C instructions with verbal understanding. Rx's were sent to pharmacy by MD. Pt's incision is clean and dry with no sign of infection. Pt's IV was removed prior to D/C. Pt D/C'd home via wheelchair per MD order. Pt is stable @ D/C and has no other needs at this time. Aviendha Azbell, RN  

## 2019-08-05 NOTE — Evaluation (Signed)
Occupational Therapy Evaluation Patient Details Name: Caitlin [name] MRN: [id number] DOB: 1946-06-03 Today's Date: 08/05/2019    History of Present Illness Pt is a 73 y/o female now s/p PLIF L3-5. PMHx includes Afib, HTN, COPD, depression, DM, asthma   Clinical Impression   This 73 y/o female presents with the above. PTA pt reports independence with ADL and functional mobility. Pt currently limited due to post-op pain and weakness and reports of intermittent nausea/dizziness with activity (vitals monitored and stable). Pt currently requiring minguard-supervision for functional mobility without AD; completing seated UB ADL with setup assist, LB ADL with light minA. Educated and reviewed with pt re: back precautions, brace management, safety and compensatory techniques for completing ADL and functional transfers with pt verbalizing/return demonstrating understanding. Pt reports plans to return home with spouse who can assist with ADL/iADL PRN. She will benefit from continued OT services while in acute setting to maximize her safety and independence with ADL and mobility. Will follow.     Follow Up Recommendations  No OT follow up;Supervision/Assistance - 24 hour    Equipment Recommendations  3 in 1 bedside commode(for use in shower)           Precautions / Restrictions Precautions Precautions: Fall;Back Precaution Booklet Issued: Yes (comment) Precaution Comments: issued and reviewed throughout functional tasks Required Braces or Orthoses: Spinal Brace Spinal Brace: Lumbar corset;Applied in sitting position Restrictions Weight Bearing Restrictions: No      Mobility Bed Mobility               General bed mobility comments: OOB in recliner, verbally reviewed  Transfers Overall transfer level: Needs assistance Equipment used: None Transfers: Sit to/from Stand Sit to Stand: Supervision         General transfer comment: supervision for safety and balance    Balance  Overall balance assessment: Needs assistance Sitting-balance support: Feet supported Sitting balance-Leahy Scale: Good     Standing balance support: No upper extremity supported;Single extremity supported;During functional activity Standing balance-Leahy Scale: Fair Standing balance comment: pt utilizing single UE support when available for increased stability, able to maintain standing balance without UE support and supervision                           ADL either performed or assessed with clinical judgement   ADL Overall ADL's : Needs assistance/impaired Eating/Feeding: Modified independent;Sitting   Grooming: Set up;Min guard;Sitting   Upper Body Bathing: Min guard;Set up;Sitting   Lower Body Bathing: Min guard;Minimal assistance;Sit to/from stand   Upper Body Dressing : Set up;Supervision/safety;Sitting   Lower Body Dressing: Minimal assistance;Sit to/from stand Lower Body Dressing Details (indicate cue type and reason): light assist for threading lower body clothing over LEs, pt able to utilize modified figure 4 to assist with task completion; close minguard in standing to advance clothing over hips Toilet Transfer: Min guard;Ambulation Toilet Transfer Details (indicate cue type and reason): simualted via transfer to/from recliner Toileting- Water quality scientist and Hygiene: Min guard;Sit to/from Nurse, children's Details (indicate cue type and reason): educated pt re: use of 3:1 as shower seat for increased safety and independence with task completion Functional mobility during ADLs: Min guard General ADL Comments: pt only requiring intermittent cues for maintaining back precautions during functional tasks, overall with good carryover/understanding     Vision         Perception     Praxis      Pertinent Vitals/Pain Pain Assessment:  Faces Faces Pain Scale: Hurts even more Pain Location: back, incisional Pain Descriptors / Indicators:  Discomfort;Grimacing;Guarding;Sore Pain Intervention(s): Limited activity within patient's tolerance;Monitored during session;Repositioned     Hand Dominance Right   Extremity/Trunk Assessment Upper Extremity Assessment Upper Extremity Assessment: Overall WFL for tasks assessed   Lower Extremity Assessment Lower Extremity Assessment: Defer to PT evaluation   Cervical / Trunk Assessment Cervical / Trunk Assessment: Other exceptions Cervical / Trunk Exceptions: s/p lumbar surgery   Communication Communication Communication: No difficulties   Cognition Arousal/Alertness: Awake/alert Behavior During Therapy: WFL for tasks assessed/performed Overall Cognitive Status: Within Functional Limits for tasks assessed                                 General Comments: overall WFL however pt intermittently keeping eyes closed due to general not feeling well   General Comments  pt reports generally feeling unwell this AM, reports episodes of n/v earlier today. endorses intermittent dizziness with activity, BP monitored and 124/66 post standing activity    Exercises     Shoulder Instructions      Home Living Family/patient expects to be discharged to:: Private residence Living Arrangements: Spouse/significant other Available Help at Discharge: Family;Available 24 hours/day               Bathroom Shower/Tub: Occupational psychologist: Standard     Home Equipment: None          Prior Functioning/Environment Level of Independence: Independent                 OT Problem List: Decreased strength;Decreased range of motion;Decreased activity tolerance;Impaired balance (sitting and/or standing);Decreased knowledge of use of DME or AE;Decreased knowledge of precautions;Pain      OT Treatment/Interventions: Self-care/ADL training;Therapeutic exercise;Energy conservation;DME and/or AE instruction;Therapeutic activities;Patient/family education;Balance  training    OT Goals(Current goals can be found in the care plan section) Acute Rehab OT Goals Patient Stated Goal: to feel better, no more n/v OT Goal Formulation: With patient Time For Goal Achievement: 08/19/19 Potential to Achieve Goals: Good  OT Frequency: Min 2X/week   Barriers to D/C:            Co-evaluation              AM-PAC OT "6 Clicks" Daily Activity     Outcome Measure Help from another person eating meals?: None Help from another person taking care of personal grooming?: A Little Help from another person toileting, which includes using toliet, bedpan, or urinal?: A Little Help from another person bathing (including washing, rinsing, drying)?: A Little Help from another person to put on and taking off regular upper body clothing?: None Help from another person to put on and taking off regular lower body clothing?: A Little 6 Click Score: 20   End of Session Equipment Utilized During Treatment: Back brace Nurse Communication: Mobility status  Activity Tolerance: Patient tolerated treatment well Patient left: in chair;with call bell/phone within reach  OT Visit Diagnosis: Other abnormalities of gait and mobility (R26.89);Pain;Unsteadiness on feet (R26.81) Pain - part of body: (back)                Time: [id number] OT Time Calculation (min): 27 min Charges:  OT General Charges $OT Visit: 1 Visit OT Evaluation $OT Eval Low Complexity: 1 Low OT Treatments $Self Care/Home Management : 8-22 mins  Lou Cal, OT [facility] Pager [phone] Office [phone]  Raymondo Band 08/05/2019, 11:27 AM

## 2019-08-09 ENCOUNTER — Inpatient Hospital Stay (HOSPITAL_COMMUNITY)
Admission: EM | Admit: 2019-08-09 | Discharge: 2019-08-11 | DRG: 908 | Disposition: A | Discharge status: Home / Self Care | Payer: Medicare Other | Attending: Neurosurgery | Admitting: Neurosurgery

## 2019-08-09 ENCOUNTER — Emergency Department (HOSPITAL_COMMUNITY): Payer: Medicare Other | Admitting: Certified Registered"

## 2019-08-09 ENCOUNTER — Encounter (HOSPITAL_COMMUNITY)
Admission: EM | Disposition: A | Discharge status: Home / Self Care | Payer: Self-pay | Source: Home / Self Care | Attending: Neurosurgery

## 2019-08-09 ENCOUNTER — Emergency Department (HOSPITAL_COMMUNITY): Payer: Medicare Other

## 2019-08-09 ENCOUNTER — Other Ambulatory Visit: Payer: Self-pay

## 2019-08-09 ENCOUNTER — Encounter (HOSPITAL_COMMUNITY): Payer: Self-pay | Admitting: Emergency Medicine

## 2019-08-09 DIAGNOSIS — J449 Chronic obstructive pulmonary disease, unspecified: Secondary | ICD-10-CM | POA: Diagnosis present

## 2019-08-09 DIAGNOSIS — G4733 Obstructive sleep apnea (adult) (pediatric): Secondary | ICD-10-CM | POA: Diagnosis not present

## 2019-08-09 DIAGNOSIS — G9761 Postprocedural hematoma of a nervous system organ or structure following a nervous system procedure: Principal | ICD-10-CM | POA: Diagnosis present

## 2019-08-09 DIAGNOSIS — M48061 Spinal stenosis, lumbar region without neurogenic claudication: Secondary | ICD-10-CM | POA: Diagnosis present

## 2019-08-09 DIAGNOSIS — E7800 Pure hypercholesterolemia, unspecified: Secondary | ICD-10-CM | POA: Diagnosis present

## 2019-08-09 DIAGNOSIS — Z800 Family history of malignant neoplasm of digestive organs: Secondary | ICD-10-CM | POA: Diagnosis not present

## 2019-08-09 DIAGNOSIS — S064X9A Epidural hemorrhage with loss of consciousness of unspecified duration, initial encounter: Secondary | ICD-10-CM | POA: Diagnosis present

## 2019-08-09 DIAGNOSIS — S064XAA Epidural hemorrhage with loss of consciousness status unknown, initial encounter: Secondary | ICD-10-CM | POA: Diagnosis present

## 2019-08-09 DIAGNOSIS — E1140 Type 2 diabetes mellitus with diabetic neuropathy, unspecified: Secondary | ICD-10-CM | POA: Diagnosis not present

## 2019-08-09 DIAGNOSIS — Z20828 Contact with and (suspected) exposure to other viral communicable diseases: Secondary | ICD-10-CM | POA: Diagnosis present

## 2019-08-09 DIAGNOSIS — Z885 Allergy status to narcotic agent status: Secondary | ICD-10-CM

## 2019-08-09 DIAGNOSIS — K219 Gastro-esophageal reflux disease without esophagitis: Secondary | ICD-10-CM | POA: Diagnosis present

## 2019-08-09 DIAGNOSIS — T148XXA Other injury of unspecified body region, initial encounter: Secondary | ICD-10-CM | POA: Diagnosis present

## 2019-08-09 DIAGNOSIS — Z7901 Long term (current) use of anticoagulants: Secondary | ICD-10-CM | POA: Diagnosis not present

## 2019-08-09 DIAGNOSIS — Z794 Long term (current) use of insulin: Secondary | ICD-10-CM | POA: Diagnosis not present

## 2019-08-09 DIAGNOSIS — R339 Retention of urine, unspecified: Secondary | ICD-10-CM | POA: Diagnosis present

## 2019-08-09 DIAGNOSIS — G834 Cauda equina syndrome: Secondary | ICD-10-CM | POA: Diagnosis present

## 2019-08-09 DIAGNOSIS — Z981 Arthrodesis status: Secondary | ICD-10-CM

## 2019-08-09 DIAGNOSIS — Y838 Other surgical procedures as the cause of abnormal reaction of the patient, or of later complication, without mention of misadventure at the time of the procedure: Secondary | ICD-10-CM | POA: Diagnosis present

## 2019-08-09 DIAGNOSIS — G2581 Restless legs syndrome: Secondary | ICD-10-CM | POA: Diagnosis present

## 2019-08-09 DIAGNOSIS — I4891 Unspecified atrial fibrillation: Secondary | ICD-10-CM | POA: Diagnosis not present

## 2019-08-09 DIAGNOSIS — I10 Essential (primary) hypertension: Secondary | ICD-10-CM | POA: Diagnosis not present

## 2019-08-09 HISTORY — PX: LUMBAR WOUND DEBRIDEMENT: SHX1988

## 2019-08-09 LAB — COMPREHENSIVE METABOLIC PANEL
ALT: 14 U/L (ref 0–44)
AST: 16 U/L (ref 15–41)
Albumin: 3.1 g/dL — ABNORMAL LOW (ref 3.5–5.0)
Alkaline Phosphatase: 60 U/L (ref 38–126)
Anion gap: 9 (ref 5–15)
BUN: 11 mg/dL (ref 8–23)
CO2: 30 mmol/L (ref 22–32)
Calcium: 9 mg/dL (ref 8.9–10.3)
Chloride: 100 mmol/L (ref 98–111)
Creatinine, Ser: 0.75 mg/dL (ref 0.44–1.00)
GFR calc Af Amer: >60 mL/min (ref >60)
GFR calc non Af Amer: >60 mL/min (ref >60)
Glucose, Bld: 144 mg/dL — ABNORMAL HIGH (ref 70–99)
Potassium: 4.2 mmol/L (ref 3.5–5.1)
Sodium: 139 mmol/L (ref 135–145)
Total Bilirubin: 0.6 mg/dL (ref 0.3–1.2)
Total Protein: 6 g/dL — ABNORMAL LOW (ref 6.5–8.1)

## 2019-08-09 LAB — CBC
HCT: 29.2 % — ABNORMAL LOW (ref 36.0–46.0)
Hemoglobin: 9.3 g/dL — ABNORMAL LOW (ref 12.0–15.0)
MCH: 28.6 pg (ref 26.0–34.0)
MCHC: 31.8 g/dL (ref 30.0–36.0)
MCV: 89.8 fL (ref 80.0–100.0)
Platelets: 291 10*3/uL (ref 150–400)
RBC: 3.25 MIL/uL — ABNORMAL LOW (ref 3.87–5.11)
RDW: 13.2 % (ref 11.5–15.5)
WBC: 10 10*3/uL (ref 4.0–10.5)
nRBC: 0 % (ref 0.0–0.2)

## 2019-08-09 LAB — RESPIRATORY PANEL BY RT PCR (FLU A&B, COVID)
Influenza A by PCR: NEGATIVE
Influenza B by PCR: NEGATIVE
SARS Coronavirus 2 by RT PCR: NEGATIVE

## 2019-08-09 LAB — URINALYSIS, ROUTINE W REFLEX MICROSCOPIC
Bilirubin Urine: NEGATIVE
Glucose, UA: NEGATIVE mg/dL
Hgb urine dipstick: NEGATIVE
Ketones, ur: NEGATIVE mg/dL
Nitrite: NEGATIVE
Protein, ur: NEGATIVE mg/dL
Specific Gravity, Urine: 1.019 (ref 1.005–1.030)
pH: 5 (ref 5.0–8.0)

## 2019-08-09 LAB — HEMOGLOBIN A1C
Hgb A1c MFr Bld: 6.6 % — ABNORMAL HIGH (ref 4.8–5.6)
Mean Plasma Glucose: 142.72 mg/dL

## 2019-08-09 LAB — GLUCOSE, CAPILLARY: Glucose-Capillary: 141 mg/dL — ABNORMAL HIGH (ref 70–99)

## 2019-08-09 SURGERY — LUMBAR WOUND DEBRIDEMENT
Anesthesia: General | Site: Spine Lumbar

## 2019-08-09 MED ORDER — ONDANSETRON HCL 4 MG/2ML IJ SOLN
4.0000 mg | Freq: Four times a day (QID) | INTRAMUSCULAR | Status: DC | PRN
  Administered 2019-08-10: 4 mg via INTRAVENOUS
  Filled 2019-08-09: qty 2

## 2019-08-09 MED ORDER — OXYCODONE HCL 5 MG PO TABS
5.0000 mg | ORAL_TABLET | ORAL | Status: DC | PRN
  Administered 2019-08-09 – 2019-08-11 (×5): 5 mg via ORAL
  Filled 2019-08-09 (×4): qty 1

## 2019-08-09 MED ORDER — ACETAMINOPHEN 10 MG/ML IV SOLN
1000.0000 mg | Freq: Once | INTRAVENOUS | Status: DC | PRN
  Administered 2019-08-09: 1000 mg via INTRAVENOUS

## 2019-08-09 MED ORDER — GADOBUTROL 1 MMOL/ML IV SOLN
9.0000 mL | Freq: Once | INTRAVENOUS | Status: AC | PRN
  Administered 2019-08-09: 9 mL via INTRAVENOUS

## 2019-08-09 MED ORDER — PROPOFOL 10 MG/ML IV BOLUS
INTRAVENOUS | Status: AC
  Filled 2019-08-09: qty 20

## 2019-08-09 MED ORDER — ONDANSETRON HCL 4 MG/2ML IJ SOLN
INTRAMUSCULAR | Status: AC
  Filled 2019-08-09: qty 2

## 2019-08-09 MED ORDER — SODIUM CHLORIDE 0.9% FLUSH: 3.0000 mL | Freq: Once | INTRAVENOUS | Status: DC

## 2019-08-09 MED ORDER — MORPHINE SULFATE (PF) 4 MG/ML IV SOLN: 4.0000 mg | INTRAVENOUS | Status: DC | PRN

## 2019-08-09 MED ORDER — SUGAMMADEX SODIUM 200 MG/2ML IV SOLN
INTRAVENOUS | Status: DC | PRN
  Administered 2019-08-09: 200 mg via INTRAVENOUS

## 2019-08-09 MED ORDER — CEFAZOLIN SODIUM-DEXTROSE 2-4 GM/100ML-% IV SOLN
2.0000 g | Freq: Three times a day (TID) | INTRAVENOUS | Status: AC
  Administered 2019-08-09 – 2019-08-10 (×2): 2 g via INTRAVENOUS
  Filled 2019-08-09 (×3): qty 100

## 2019-08-09 MED ORDER — PROPOFOL 10 MG/ML IV BOLUS
INTRAVENOUS | Status: DC | PRN
  Administered 2019-08-09: 200 mg via INTRAVENOUS

## 2019-08-09 MED ORDER — INSULIN ASPART 100 UNIT/ML ~~LOC~~ SOLN
0.0000 [IU] | SUBCUTANEOUS | Status: DC
  Administered 2019-08-09: 3 [IU] via SUBCUTANEOUS
  Administered 2019-08-10: 4 [IU] via SUBCUTANEOUS
  Administered 2019-08-10: 3 [IU] via SUBCUTANEOUS
  Administered 2019-08-10 (×3): 4 [IU] via SUBCUTANEOUS
  Administered 2019-08-11 (×2): 3 [IU] via SUBCUTANEOUS

## 2019-08-09 MED ORDER — PHENOL 1.4 % MT LIQD: 1.0000 | OROMUCOSAL | Status: DC | PRN

## 2019-08-09 MED ORDER — ZOLPIDEM TARTRATE 5 MG PO TABS: 5.0000 mg | ORAL_TABLET | Freq: Every evening | ORAL | Status: DC | PRN

## 2019-08-09 MED ORDER — ACETAMINOPHEN 500 MG PO TABS
1000.0000 mg | ORAL_TABLET | Freq: Four times a day (QID) | ORAL | Status: AC
  Administered 2019-08-09 – 2019-08-10 (×3): 1000 mg via ORAL
  Filled 2019-08-09 (×3): qty 2

## 2019-08-09 MED ORDER — ONDANSETRON HCL 4 MG PO TABS: 4.0000 mg | ORAL_TABLET | Freq: Four times a day (QID) | ORAL | Status: DC | PRN

## 2019-08-09 MED ORDER — ONDANSETRON HCL 4 MG PO TABS: 4.0000 mg | ORAL_TABLET | Freq: Three times a day (TID) | ORAL | Status: DC | PRN

## 2019-08-09 MED ORDER — PHENYLEPHRINE 40 MCG/ML (10ML) SYRINGE FOR IV PUSH (FOR BLOOD PRESSURE SUPPORT)
PREFILLED_SYRINGE | INTRAVENOUS | Status: DC | PRN
  Administered 2019-08-09 (×2): 80 ug via INTRAVENOUS

## 2019-08-09 MED ORDER — LORATADINE 10 MG PO TABS
10.0000 mg | ORAL_TABLET | Freq: Every day | ORAL | Status: DC
  Administered 2019-08-10 – 2019-08-11 (×2): 10 mg via ORAL
  Filled 2019-08-09 (×2): qty 1

## 2019-08-09 MED ORDER — THROMBIN 5000 UNITS EX SOLR
CUTANEOUS | Status: AC
  Filled 2019-08-09: qty 5000

## 2019-08-09 MED ORDER — DULOXETINE HCL 60 MG PO CPEP
60.0000 mg | ORAL_CAPSULE | Freq: Every evening | ORAL | Status: DC
  Administered 2019-08-09 – 2019-08-10 (×2): 60 mg via ORAL
  Filled 2019-08-09 (×2): qty 1

## 2019-08-09 MED ORDER — PROPOFOL 500 MG/50ML IV EMUL
INTRAVENOUS | Status: DC | PRN
  Administered 2019-08-09: 25 ug/kg/min via INTRAVENOUS

## 2019-08-09 MED ORDER — POTASSIUM GLUCONATE 550 (90 K) MG PO TABS: 550.0000 mg | ORAL_TABLET | Freq: Every day | ORAL | Status: DC | PRN

## 2019-08-09 MED ORDER — BACITRACIN ZINC 500 UNIT/GM EX OINT
TOPICAL_OINTMENT | CUTANEOUS | Status: DC | PRN
  Administered 2019-08-09: 1 via TOPICAL

## 2019-08-09 MED ORDER — POLYETHYL GLYCOL-PROPYL GLYCOL 0.4-0.3 % OP SOLN: Freq: Four times a day (QID) | OPHTHALMIC | Status: DC

## 2019-08-09 MED ORDER — KETOROLAC TROMETHAMINE 15 MG/ML IJ SOLN: 15.0000 mg | Freq: Once | INTRAMUSCULAR | Status: DC | PRN

## 2019-08-09 MED ORDER — ONDANSETRON HCL 4 MG/2ML IJ SOLN: 4.0000 mg | Freq: Once | INTRAMUSCULAR | Status: DC | PRN

## 2019-08-09 MED ORDER — ACETAMINOPHEN 650 MG RE SUPP: 650.0000 mg | RECTAL | Status: DC | PRN

## 2019-08-09 MED ORDER — LISINOPRIL 20 MG PO TABS
20.0000 mg | ORAL_TABLET | Freq: Every day | ORAL | Status: DC
  Administered 2019-08-10 – 2019-08-11 (×2): 20 mg via ORAL
  Filled 2019-08-09 (×2): qty 1

## 2019-08-09 MED ORDER — LIDOCAINE-EPINEPHRINE 1 %-1:100000 IJ SOLN
INTRAMUSCULAR | Status: AC
  Filled 2019-08-09: qty 1

## 2019-08-09 MED ORDER — SODIUM CHLORIDE 0.9 % IV SOLN
250.0000 mL | INTRAVENOUS | Status: DC
  Administered 2019-08-09: 250 mL via INTRAVENOUS

## 2019-08-09 MED ORDER — FENTANYL CITRATE (PF) 100 MCG/2ML IJ SOLN
25.0000 ug | Freq: Once | INTRAMUSCULAR | Status: AC
  Administered 2019-08-09: 25 ug via INTRAVENOUS
  Filled 2019-08-09: qty 2

## 2019-08-09 MED ORDER — DILTIAZEM HCL ER COATED BEADS 300 MG PO CP24
300.0000 mg | ORAL_CAPSULE | Freq: Every evening | ORAL | Status: DC
  Administered 2019-08-09: 300 mg via ORAL
  Filled 2019-08-09 (×3): qty 1

## 2019-08-09 MED ORDER — ROPINIROLE HCL 1 MG PO TABS: 2.0000 mg | ORAL_TABLET | Freq: Two times a day (BID) | ORAL | Status: DC | PRN

## 2019-08-09 MED ORDER — PHENYLEPHRINE 40 MCG/ML (10ML) SYRINGE FOR IV PUSH (FOR BLOOD PRESSURE SUPPORT)
PREFILLED_SYRINGE | INTRAVENOUS | Status: AC
  Filled 2019-08-09: qty 10

## 2019-08-09 MED ORDER — LIDOCAINE 2% (20 MG/ML) 5 ML SYRINGE
INTRAMUSCULAR | Status: DC | PRN
  Administered 2019-08-09: 60 mg via INTRAVENOUS

## 2019-08-09 MED ORDER — OXYCODONE HCL 5 MG PO TABS
10.0000 mg | ORAL_TABLET | ORAL | Status: DC | PRN
  Administered 2019-08-10: 10 mg via ORAL
  Filled 2019-08-09 (×2): qty 2

## 2019-08-09 MED ORDER — CEFAZOLIN SODIUM 1 G IJ SOLR
INTRAMUSCULAR | Status: AC
  Filled 2019-08-09: qty 20

## 2019-08-09 MED ORDER — LACTATED RINGERS IV SOLN: INTRAVENOUS | Status: DC

## 2019-08-09 MED ORDER — MENTHOL 3 MG MT LOZG: 1.0000 | LOZENGE | OROMUCOSAL | Status: DC | PRN

## 2019-08-09 MED ORDER — TIZANIDINE HCL 2 MG PO TABS
2.0000 mg | ORAL_TABLET | Freq: Four times a day (QID) | ORAL | Status: DC | PRN
  Administered 2019-08-10: 2 mg via ORAL
  Filled 2019-08-09 (×2): qty 1

## 2019-08-09 MED ORDER — FENTANYL CITRATE (PF) 100 MCG/2ML IJ SOLN
INTRAMUSCULAR | Status: DC | PRN
  Administered 2019-08-09: 50 ug via INTRAVENOUS
  Administered 2019-08-09: 100 ug via INTRAVENOUS

## 2019-08-09 MED ORDER — ACETAMINOPHEN 10 MG/ML IV SOLN
INTRAVENOUS | Status: AC
  Filled 2019-08-09: qty 100

## 2019-08-09 MED ORDER — SODIUM CHLORIDE 0.9% FLUSH
3.0000 mL | Freq: Two times a day (BID) | INTRAVENOUS | Status: DC
  Administered 2019-08-09 – 2019-08-11 (×3): 3 mL via INTRAVENOUS

## 2019-08-09 MED ORDER — DEXAMETHASONE SODIUM PHOSPHATE 10 MG/ML IJ SOLN
INTRAMUSCULAR | Status: DC | PRN
  Administered 2019-08-09: 5 mg via INTRAVENOUS

## 2019-08-09 MED ORDER — DOCUSATE SODIUM 100 MG PO CAPS
100.0000 mg | ORAL_CAPSULE | Freq: Two times a day (BID) | ORAL | Status: DC
  Administered 2019-08-09 – 2019-08-11 (×4): 100 mg via ORAL
  Filled 2019-08-09 (×4): qty 1

## 2019-08-09 MED ORDER — BACITRACIN ZINC 500 UNIT/GM EX OINT
TOPICAL_OINTMENT | CUTANEOUS | Status: AC
  Filled 2019-08-09: qty 28.35

## 2019-08-09 MED ORDER — OLOPATADINE HCL 0.1 % OP SOLN
1.0000 [drp] | Freq: Every day | OPHTHALMIC | Status: DC | PRN
  Filled 2019-08-09: qty 5

## 2019-08-09 MED ORDER — ROCURONIUM BROMIDE 10 MG/ML (PF) SYRINGE
PREFILLED_SYRINGE | INTRAVENOUS | Status: DC | PRN
  Administered 2019-08-09: 40 mg via INTRAVENOUS
  Administered 2019-08-09: 20 mg via INTRAVENOUS

## 2019-08-09 MED ORDER — ACETAMINOPHEN 325 MG PO TABS: 650.0000 mg | ORAL_TABLET | ORAL | Status: DC | PRN

## 2019-08-09 MED ORDER — BUPIVACAINE-EPINEPHRINE 0.5% -1:200000 IJ SOLN
INTRAMUSCULAR | Status: AC
  Filled 2019-08-09: qty 1

## 2019-08-09 MED ORDER — FENTANYL CITRATE (PF) 250 MCG/5ML IJ SOLN
INTRAMUSCULAR | Status: AC
  Filled 2019-08-09: qty 5

## 2019-08-09 MED ORDER — THROMBIN 5000 UNITS EX SOLR
OROMUCOSAL | Status: DC | PRN
  Administered 2019-08-09: 5 mL via TOPICAL

## 2019-08-09 MED ORDER — FENTANYL CITRATE (PF) 100 MCG/2ML IJ SOLN
INTRAMUSCULAR | Status: AC
  Filled 2019-08-09: qty 2

## 2019-08-09 MED ORDER — ALBUTEROL SULFATE (2.5 MG/3ML) 0.083% IN NEBU: 2.5000 mg | INHALATION_SOLUTION | Freq: Four times a day (QID) | RESPIRATORY_TRACT | Status: DC | PRN

## 2019-08-09 MED ORDER — SODIUM CHLORIDE 0.9 % IV SOLN
INTRAVENOUS | Status: DC | PRN
  Administered 2019-08-09: 500 mL

## 2019-08-09 MED ORDER — FENTANYL CITRATE (PF) 100 MCG/2ML IJ SOLN
25.0000 ug | INTRAMUSCULAR | Status: DC | PRN
  Administered 2019-08-09: 25 ug via INTRAVENOUS

## 2019-08-09 MED ORDER — PANTOPRAZOLE SODIUM 40 MG PO TBEC
80.0000 mg | DELAYED_RELEASE_TABLET | Freq: Every day | ORAL | Status: DC
  Administered 2019-08-09 – 2019-08-11 (×3): 80 mg via ORAL
  Filled 2019-08-09 (×3): qty 2

## 2019-08-09 MED ORDER — ONDANSETRON HCL 4 MG/2ML IJ SOLN
INTRAMUSCULAR | Status: DC | PRN
  Administered 2019-08-09: 4 mg via INTRAVENOUS

## 2019-08-09 MED ORDER — ONDANSETRON HCL 4 MG/2ML IJ SOLN
4.0000 mg | Freq: Once | INTRAMUSCULAR | Status: AC
  Administered 2019-08-09: 4 mg via INTRAVENOUS
  Filled 2019-08-09: qty 2

## 2019-08-09 MED ORDER — VANCOMYCIN HCL 1000 MG IV SOLR
INTRAVENOUS | Status: AC
  Filled 2019-08-09: qty 1000

## 2019-08-09 MED ORDER — 0.9 % SODIUM CHLORIDE (POUR BTL) OPTIME
TOPICAL | Status: DC | PRN
  Administered 2019-08-09: 1000 mL

## 2019-08-09 MED ORDER — BISACODYL 10 MG RE SUPP
10.0000 mg | Freq: Every day | RECTAL | Status: DC | PRN
  Administered 2019-08-10: 10 mg via RECTAL
  Filled 2019-08-09: qty 1

## 2019-08-09 MED ORDER — SODIUM CHLORIDE 0.9% FLUSH: 3.0000 mL | INTRAVENOUS | Status: DC | PRN

## 2019-08-09 SURGICAL SUPPLY — 46 items
BAG DECANTER FOR FLEXI CONT (MISCELLANEOUS) ×2 IMPLANT
BENZOIN TINCTURE PRP APPL 2/3 (GAUZE/BANDAGES/DRESSINGS) ×2 IMPLANT
BLADE CLIPPER SURG (BLADE) IMPLANT
CANISTER SUCT 3000ML PPV (MISCELLANEOUS) ×2 IMPLANT
CARTRIDGE OIL MAESTRO DRILL (MISCELLANEOUS) IMPLANT
COVER WAND RF STERILE (DRAPES) ×2 IMPLANT
DIFFUSER DRILL AIR PNEUMATIC (MISCELLANEOUS) IMPLANT
DRAPE LAPAROTOMY 100X72X124 (DRAPES) ×2 IMPLANT
DRAPE SURG 17X23 STRL (DRAPES) ×8 IMPLANT
DRSG OPSITE POSTOP 4X6 (GAUZE/BANDAGES/DRESSINGS) ×2 IMPLANT
DRSG TEGADERM 4X4.75 (GAUZE/BANDAGES/DRESSINGS) ×2 IMPLANT
ELECT REM PT RETURN 9FT ADLT (ELECTROSURGICAL) ×2
ELECTRODE REM PT RTRN 9FT ADLT (ELECTROSURGICAL) ×1 IMPLANT
EVACUATOR 1/8 PVC DRAIN (DRAIN) ×2 IMPLANT
EVACUATOR 3/16  PVC DRAIN (DRAIN)
EVACUATOR 3/16 PVC DRAIN (DRAIN) IMPLANT
GAUZE 4X4 16PLY RFD (DISPOSABLE) IMPLANT
GAUZE SPONGE 4X4 12PLY STRL (GAUZE/BANDAGES/DRESSINGS) ×2 IMPLANT
GAUZE SPONGE 4X4 12PLY STRL LF (GAUZE/BANDAGES/DRESSINGS) ×2 IMPLANT
GLOVE BIO SURGEON STRL SZ7 (GLOVE) ×2 IMPLANT
GLOVE BIO SURGEON STRL SZ8 (GLOVE) ×2 IMPLANT
GLOVE BIO SURGEON STRL SZ8.5 (GLOVE) ×2 IMPLANT
GLOVE BIOGEL PI IND STRL 7.5 (GLOVE) ×1 IMPLANT
GLOVE BIOGEL PI INDICATOR 7.5 (GLOVE) ×1
GLOVE EXAM NITRILE XL STR (GLOVE) IMPLANT
GOWN STRL REUS W/ TWL LRG LVL3 (GOWN DISPOSABLE) ×1 IMPLANT
GOWN STRL REUS W/ TWL XL LVL3 (GOWN DISPOSABLE) IMPLANT
GOWN STRL REUS W/TWL LRG LVL3 (GOWN DISPOSABLE) ×1
GOWN STRL REUS W/TWL XL LVL3 (GOWN DISPOSABLE)
KIT BASIN OR (CUSTOM PROCEDURE TRAY) ×2 IMPLANT
KIT TURNOVER KIT B (KITS) ×2 IMPLANT
NEEDLE HYPO 22GX1.5 SAFETY (NEEDLE) IMPLANT
NS IRRIG 1000ML POUR BTL (IV SOLUTION) ×2 IMPLANT
OIL CARTRIDGE MAESTRO DRILL (MISCELLANEOUS)
PACK LAMINECTOMY NEURO (CUSTOM PROCEDURE TRAY) ×2 IMPLANT
PAD ARMBOARD 7.5X6 YLW CONV (MISCELLANEOUS) ×6 IMPLANT
STRIP CLOSURE SKIN 1/2X4 (GAUZE/BANDAGES/DRESSINGS) ×2 IMPLANT
SUT ETHILON 2 0 PSLX (SUTURE) IMPLANT
SUT VIC AB 1 CT1 18XBRD ANBCTR (SUTURE) ×1 IMPLANT
SUT VIC AB 1 CT1 8-18 (SUTURE) ×1
SUT VIC AB 2-0 CP2 18 (SUTURE) ×2 IMPLANT
SWAB COLLECTION DEVICE MRSA (MISCELLANEOUS) IMPLANT
SWAB CULTURE ESWAB REG 1ML (MISCELLANEOUS) IMPLANT
TOWEL GREEN STERILE (TOWEL DISPOSABLE) ×2 IMPLANT
TOWEL GREEN STERILE FF (TOWEL DISPOSABLE) ×2 IMPLANT
WATER STERILE IRR 1000ML POUR (IV SOLUTION) ×2 IMPLANT

## 2019-08-09 NOTE — Anesthesia Procedure Notes (Signed)
Procedure Name: Intubation Date/Time: 08/09/2019 4:30 PM Performed by: Imagene Riches, CRNA Pre-anesthesia Checklist: Patient identified, Emergency Drugs available, Suction available and Patient being monitored Patient Re-evaluated:Patient Re-evaluated prior to induction Oxygen Delivery Method: Circle System Utilized Preoxygenation: Pre-oxygenation with 100% oxygen Induction Type: IV induction Ventilation: Mask ventilation without difficulty Laryngoscope Size: Miller and 2 Grade View: Grade I Tube type: Oral Tube size: 7.0 mm Number of attempts: 1 Airway Equipment and Method: Stylet and Oral airway Placement Confirmation: ETT inserted through vocal cords under direct vision,  positive ETCO2 and breath sounds checked- equal and bilateral Secured at: 21 cm Tube secured with: Tape Dental Injury: Teeth and Oropharynx as per pre-operative assessment

## 2019-08-09 NOTE — ED Provider Notes (Signed)
[facility] EMERGENCY DEPARTMENT Provider Note   CSN: [id number] Arrival date & time: 08/09/19  0907     History Chief Complaint  Patient presents with  . here for MRI    possible     Caitlin [name] is a 73 y.o. female.  HPI    Patient presents that 5 days after lumbar spine surgery now with concern for urinary incontinence, lower extremity and back pain. Patient notes that prior to the surgery she had been diagnosed with urinary tract infection, was having some flank pain and urge incontinence. She does not recall there being any complications of her surgery, which was performed here. Since the event she has had soreness in her low back, lower abdomen bilaterally, and increasingly difficult times with urinating. No complete incontinence, nor since that she is not controlling her bladder, but the patient notes that she has urge to go, and now is essentially incapable of making it to the bathroom in time. No new lower extremity weakness or pain, but this is persistent, symmetric. No fever, no vomiting, no chest pain. She is taking her medications provided on discharge include narcotics. Past Medical History:  Diagnosis Date  . Arthritis   . Asthma   . Atrial fibrillation ([place])   . Complication of anesthesia   . COPD (chronic obstructive pulmonary disease) ([place])   . Depression   . Diabetes mellitus without complication ([place])    Type II  . Dysrhythmia   . GERD (gastroesophageal reflux disease)   . HTN (hypertension)   . Hypercholesteremia   . Obstructive sleep apnea   . PONV (postoperative nausea and vomiting)    "not the last few times"  . RLS (restless legs syndrome)   . Sleep apnea     Patient Active Problem List   Diagnosis Date Noted  . S/P lumbar fusion 08/04/2019    Past Surgical History:  Procedure Laterality Date  . ATRIAL FIBRILLATION ABLATION     x3  . COLONOSCOPY  01/04/2013   Moderate predominantly sigmoid diverticulosis. Small  internal hemorrhoids. Otherwise normal colonosopy.      OB History   No obstetric history on file.     Family History  Problem Relation Age of Onset  . Colon cancer Paternal Aunt 51    Social History   Tobacco Use  . Smoking status: Never Smoker  . Smokeless tobacco: Never Used  Substance Use Topics  . Alcohol use: Not Currently  . Drug use: Never    Home Medications Prior to Admission medications   Medication Sig Start Date End Date Taking? Authorizing Provider  albuterol (PROVENTIL) (2.5 MG/3ML) 0.083% nebulizer solution Take 2.5 mg by nebulization every 6 (six) hours as needed for wheezing or shortness of breath.     [provider]  calcium carbonate (TUMS) 500 MG chewable tablet Chew 2 tablets by mouth daily as needed for indigestion.     [provider]  diltiazem (TIAZAC) 300 MG 24 hr capsule Take 300 mg by mouth every evening. 05/04/19   [provider]  DULoxetine (CYMBALTA) 60 MG capsule Take 60 mg by mouth every evening.     [provider]  fexofenadine (ALLEGRA) 180 MG tablet Take 180 mg by mouth daily as needed for allergies or rhinitis.    [provider]  Insulin Pen Needle (FIFTY50 PEN NEEDLES) 31G X 8 MM MISC Inject 1 Units into the skin daily. 01/07/18   [provider]  lisinopril (ZESTRIL) 20 MG tablet Take  20 mg by mouth daily.    [provider]  Misc Natural Products (LUTEIN 20) CAPS Take 20 mg by mouth every evening.    [provider]  Olopatadine HCl (PATADAY OP) Place 1 drop into both eyes daily.    [provider]  omeprazole (PRILOSEC) 40 MG capsule Take 40 mg by mouth 2 (two) times daily.  04/27/18   [provider]  oxyCODONE (OXY IR/ROXICODONE) 5 MG immediate release tablet Take 1 tablet (5 mg total) by mouth every 4 (four) hours as needed for moderate pain ((score 4 to 6)). 08/05/19   Eustace Moore, MD  Polyethyl Glycol-Propyl Glycol (SYSTANE ULTRA OP) Place 1  drop into both eyes 4 (four) times daily.    [provider]  Potassium Gluconate 550 (90 K) MG TABS Take 550 mg by mouth daily as needed (cramps).    [provider]  rOPINIRole (REQUIP) 2 MG tablet Take 2 mg by mouth 2 (two) times daily.  07/22/18   [provider]  Semaglutide,0.25 or 0.5MG /DOS, 2 MG/1.5ML SOPN Inject 0.25 mg into the skin every Monday.  01/30/18   [provider]  tiZANidine (ZANAFLEX) 2 MG tablet Take 1 tablet (2 mg total) by mouth every 6 (six) hours as needed. 08/05/19   Eustace Moore, MD  TRESIBA FLEXTOUCH 200 UNIT/ML SOPN Inject 80 Units into the skin every evening.  10/07/18   [provider]  VITAMIN D-VITAMIN K PO Take 1 tablet by mouth every evening.    [provider]  XARELTO 20 MG TABS tablet Take 20 mg by mouth every evening.  04/27/18   [provider]    Allergies    Codeine  Review of Systems   Review of Systems  Constitutional:       Per HPI, otherwise negative  HENT:       Per HPI, otherwise negative  Respiratory:       Per HPI, otherwise negative  Cardiovascular:       Per HPI, otherwise negative  Gastrointestinal: Negative for vomiting.  Endocrine:       Negative aside from HPI  Genitourinary:       Neg aside from HPI   Musculoskeletal:       Per HPI, otherwise negative  Skin: Negative.   Neurological: Positive for weakness and numbness. Negative for syncope.    Physical Exam Updated Vital Signs BP (!) 160/69 (BP Location: Right Arm)   Pulse 94   Temp 98.8 F (37.1 C) (Oral)   Resp 12   Ht 5\' 3"  (1.6 m)   Wt 89.4 kg   SpO2 94%   BMI 34.90 kg/m   Physical Exam Vitals and nursing note reviewed.  Constitutional:      General: She is not in acute distress.    Appearance: She is well-developed. She is not ill-appearing or diaphoretic.  HENT:     Head: Normocephalic and atraumatic.  Eyes:     Conjunctiva/sclera: Conjunctivae normal.  Cardiovascular:     Rate and  Rhythm: Normal rate and regular rhythm.  Pulmonary:     Effort: Pulmonary effort is normal. No respiratory distress.     Breath sounds: Normal breath sounds. No stridor.  Abdominal:     General: There is no distension.     Tenderness: There is no abdominal tenderness. There is no guarding.  Musculoskeletal:        General: No deformity.     Comments:  No gross deformities, the  patient is wearing her lumbar spine brace, will need full evaluation of her incision is not possible.  However, lower extremities unremarkable, she moves them both spontaneously and to command appropriately.  Skin:    General: Skin is warm and dry.  Neurological:     Mental Status: She is alert and oriented to person, place, and time.     Cranial Nerves: No cranial nerve deficit.     Comments: Distal neurovascular exam was somewhat limited secondary to pain, but the patient can flex each hip with appropriate strength bilaterally, symmetrically.     ED Results / Procedures / Treatments   Labs (all labs ordered are listed, but only abnormal results are displayed) Labs Reviewed  COMPREHENSIVE METABOLIC PANEL - Abnormal; Notable for the following components:      Result Value   Glucose, Bld 144 (*)    Total Protein 6.0 (*)    Albumin 3.1 (*)    All other components within normal limits  CBC - Abnormal; Notable for the following components:   RBC 3.25 (*)    Hemoglobin 9.3 (*)    HCT 29.2 (*)    All other components within normal limits  URINALYSIS, ROUTINE W REFLEX MICROSCOPIC - Abnormal; Notable for the following components:   Leukocytes,Ua TRACE (*)    Bacteria, UA RARE (*)    All other components within normal limits  URINE CULTURE  RESPIRATORY PANEL BY RT PCR (FLU A&B, COVID)    Radiology MR Lumbar Spine W Wo Contrast  Result Date: 08/09/2019 CLINICAL DATA:  Back pain and urinary incontinence. Recent lumbar surgery. EXAM: MRI LUMBAR SPINE WITHOUT AND WITH CONTRAST TECHNIQUE: Multiplanar and  multiecho pulse sequences of the lumbar spine were obtained without and with intravenous contrast. CONTRAST:  22mL GADAVIST GADOBUTROL 1 MMOL/ML IV SOLN COMPARISON:  03/05/2019 FINDINGS: The study is motion degraded, including moderate motion on the axial sequences. Segmentation: Standard. Alignment: Unchanged 3 mm retrolisthesis of L1 on L2 and L2 on L3. Decreased anterolisthesis of L4 on L5 following interval fusion, now 5 mm. Vertebrae: No fracture, suspicious marrow lesion, or evidence of discitis. Interval L3-L5 PLIF. Unchanged hemangioma in the T12 vertebral body. Conus medullaris and cauda equina: Conus extends to the L1-2 level and appears normal. There is anterior displacement of the cauda equina from the inferior L1 to the L4 levels by a CSF signal intensity dorsal subdural fluid collection. Paraspinal and other soft tissues: Postoperative changes in the posterior lumbar soft tissues with dorsal epidural fluid collection in the laminectomy bed from L3-L5. Subcutaneous fluid collection in and to the right of midline from L3-L5 measures 6.2 x 2.0 x 9.2 cm (transverse x AP x craniocaudal) and demonstrates intermediate to low T1 and mixed T2 signal intensity likely reflecting in part a hematoma. Disc levels: Disc desiccation throughout the lumbar spine with unchanged moderate disc space narrowing at L1-2 and L2-3. T11-12 and T12-L1: Only imaged sagittally. Minimal disc bulging at both levels without stenosis, unchanged. L1-2: Mild disc bulging and mild facet hypertrophy without significant stenosis, unchanged. L2-3: Circumferential disc bulging eccentric to the left, a left subarticular disc extrusion with mild caudal migration, and moderate facet and ligamentum flavum hypertrophy result in mild left lateral recess stenosis without neural foraminal stenosis, similar to prior. Anterior displacement of the nerve roots within the thecal sac and partial effacement of subarachnoid CSF by the dorsal subdural fluid  collection. L3-4: Interval posterior decompression and fusion. The dorsal epidural fluid collection (with possible dorsal subdural fluid as well)  results in severe spinal stenosis with complete effacement of subarachnoid CSF. Patent neural foramina. L4-5: Interval posterior decompression and fusion. Motion and susceptibility artifact limits assessment of the spinal canal on axial images. Anterolisthesis with bulging uncovered disc, a central pseudo disc extrusion, and the dorsal epidural fluid collection result in severe spinal stenosis. There is moderate residual right neural foraminal stenosis. L5-S1: Disc bulging, a left foraminal disc extrusion, and severe facet hypertrophy result in moderate left neural foraminal stenosis which has mildly progressed. No spinal stenosis. IMPRESSION: 1. 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 2. Subdural fluid collection from L1-2 to L3-4, a not infrequent finding in the immediate postoperative although subdural fluid may contribute to the spinal stenosis at L3-4. 3. Left foraminal disc extrusion at L5-S1 with moderate foraminal stenosis, mildly progressed. 4. Unchanged left subarticular disc extrusion at L2-3 with mild left lateral recess stenosis. Electronically Signed   By: Logan Bores M.D.   On: 08/09/2019 13:22    Procedures Procedures (including critical care time)  Medications Ordered in ED Medications  sodium chloride flush (NS) 0.9 % injection 3 mL (3 mLs Intravenous Not Given 08/09/19 1310)  fentaNYL (SUBLIMAZE) injection 25 mcg (has no administration in time range)  gadobutrol (GADAVIST) 1 MMOL/ML injection 9 mL (9 mLs Intravenous Contrast Given 08/09/19 1238)    ED Course  I have reviewed the triage vital signs and the nursing notes.  Pertinent labs & imaging results that were available during my care of the patient were reviewed by me and considered in my medical decision  making (see chart for details).    MDM Rules/Calculators/A&P                     After the initial evaluation I discussed the patient's case with her neurosurgical team. Patient is alert, awake, afebrile, but with temporal proximity to surgery, MRI ordered, labs pending.  Update: Patient aware of all findings, concern for hematoma in the post surgical site versus fluid collection present with her description of worsening pain, increasing difficulty with lower extremity motion, the patient's case was discussed again with our neurosurgery colleagues, the patient will require admission for surgical exploration.   Final Clinical Impression(s) / ED Diagnoses Final diagnoses:  Hematoma     Carmin Muskrat, MD 08/09/19 1330

## 2019-08-09 NOTE — ED Notes (Signed)
Report called to [facility] at [facility] place aware patient is coming back.

## 2019-08-09 NOTE — Consult Note (Deleted)
Reason for Consult: Right frontal intraparenchymal hemorrhage Referring Physician: Dr. Ronnald Ramp is an 74 y.o. female.  HPI: The patient is a 73 year old white female with multiple medical problems including LVAD, Coumadin anticoagulation, etc. she had mental status changes earlier today and was taken to [facility] where a CAT scan was obtained and demonstrated a right frontal intraparenchymal hemorrhage.  The patient's anticoagulation was reversed with Kcentra and FFP.  She was transferred to [facility] for further care.  A neurosurgical consultation was requested.  Presently the patient is alert and responds to simple questions.  Past Medical History:  Diagnosis Date  . Arthritis   . Asthma   . Atrial fibrillation ([place])   . Complication of anesthesia   . COPD (chronic obstructive pulmonary disease) ([place])   . Depression   . Diabetes mellitus without complication ([place])    Type II  . Dysrhythmia   . GERD (gastroesophageal reflux disease)   . HTN (hypertension)   . Hypercholesteremia   . Obstructive sleep apnea   . PONV (postoperative nausea and vomiting)    "not the last few times"  . RLS (restless legs syndrome)   . Sleep apnea     Past Surgical History:  Procedure Laterality Date  . ATRIAL FIBRILLATION ABLATION     x3  . COLONOSCOPY  01/04/2013   Moderate predominantly sigmoid diverticulosis. Small internal hemorrhoids. Otherwise normal colonosopy.     Family History  Problem Relation Age of Onset  . Colon cancer Paternal Aunt 7    Social History:  reports that she has never smoked. She has never used smokeless tobacco. She reports previous alcohol use. She reports that she does not use drugs.  Allergies:  Allergies  Allergen Reactions  . Codeine Nausea Only    Medications:  I have reviewed the patient's current medications. Prior to Admission:  Medications Prior to Admission  Medication Sig Dispense Refill Last Dose  .  albuterol (PROVENTIL) (2.5 MG/3ML) 0.083% nebulizer solution Take 2.5 mg by nebulization every 6 (six) hours as needed for wheezing or shortness of breath.    couple years ago  . calcium carbonate (TUMS) 500 MG chewable tablet Chew 2 tablets by mouth daily as needed for indigestion.    month ago  . diltiazem (TIAZAC) 300 MG 24 hr capsule Take 300 mg by mouth every evening.   2 weeks ago  . DULoxetine (CYMBALTA) 60 MG capsule Take 60 mg by mouth every evening.    couple weeks ago  . fexofenadine (ALLEGRA) 180 MG tablet Take 180 mg by mouth daily as needed for allergies or rhinitis.   week ago  . Insulin Degludec (TRESIBA FLEXTOUCH) 200 UNIT/ML SOPN Inject 74 Units into the skin every evening.   12/20? at pm  . lisinopril (ZESTRIL) 20 MG tablet Take 20 mg by mouth daily.   12/20 or 12/21 at am  . Misc Natural Products (LUTEIN 20) CAPS Take 20 mg by mouth every evening.   few days ago  . Olopatadine HCl (PATADAY OP) Place 1 drop into both eyes daily as needed (itching/irritation).    few days ago  . omeprazole (PRILOSEC) 40 MG capsule Take 40 mg by mouth 2 (two) times daily.    08/08/2019 at pm  . ondansetron (ZOFRAN) 4 MG tablet Take 4 mg by mouth every 8 (eight) hours as needed for nausea or vomiting.    08/08/2019 at pm  . oxyCODONE (OXY IR/ROXICODONE) 5 MG immediate release tablet Take 1  tablet (5 mg total) by mouth every 4 (four) hours as needed for moderate pain ((score 4 to 6)). 40 tablet 0 08/08/2019 at pm  . Polyethyl Glycol-Propyl Glycol (SYSTANE ULTRA OP) Place 1 drop into both eyes 4 (four) times daily.   08/09/2019 at am  . Potassium Gluconate 550 (90 K) MG TABS Take 550 mg by mouth daily as needed (cramps).   couple weeks ago  . rOPINIRole (REQUIP) 2 MG tablet Take 2 mg by mouth 2 (two) times daily as needed (restless legs).    2 weeks ago  . Semaglutide,0.25 or 0.5MG /DOS, 2 MG/1.5ML SOPN Inject 0.25 mg into the skin every Monday.    08/02/2019  . tiZANidine (ZANAFLEX) 2 MG tablet Take 1  tablet (2 mg total) by mouth every 6 (six) hours as needed. (Patient taking differently: Take 2 mg by mouth every 6 (six) hours as needed for muscle spasms. ) 60 tablet 1 08/08/2019 at pm  . VITAMIN D-VITAMIN K PO Take 1 tablet by mouth every evening.   couple nights ago  . Insulin Pen Needle (FIFTY50 PEN NEEDLES) 31G X 8 MM MISC Inject 1 Units into the skin daily.     . rivaroxaban (XARELTO) 20 MG TABS tablet Take 20 mg by mouth daily with supper.   12/12??   Scheduled: . fentaNYL      . [MAR Hold] sodium chloride flush  3 mL Intravenous Once   Continuous: . acetaminophen    . acetaminophen 1,000 mg (08/09/19 1757)  . lactated ringers 10 mL/hr at 08/09/19 1549   [id number], fentaNYL (SUBLIMAZE) injection, ketorolac, ondansetron (ZOFRAN) IV Anti-infectives (From admission, onward)   Start     Dose/Rate Route Frequency Ordered Stop   08/09/19 1704  bacitracin 50,000 Units in sodium chloride 0.9 % 500 mL irrigation  Status:  Discontinued       As needed 08/09/19 1745 08/09/19 1747       Results for orders placed or performed during the hospital encounter of 08/09/19 (from the past 48 hour(s))  Comprehensive metabolic panel     Status: Abnormal   Collection Time: 08/09/19  9:29 AM  Result Value Ref Range   Sodium 139 135 - 145 mmol/L   Potassium 4.2 3.5 - 5.1 mmol/L   Chloride 100 98 - 111 mmol/L   CO2 30 22 - 32 mmol/L   Glucose, Bld 144 (H) 70 - 99 mg/dL   BUN 11 8 - 23 mg/dL   Creatinine, Ser 0.75 0.44 - 1.00 mg/dL   Calcium 9.0 8.9 - 10.3 mg/dL   Total Protein 6.0 (L) 6.5 - 8.1 g/dL   Albumin 3.1 (L) 3.5 - 5.0 g/dL   AST 16 15 - 41 U/L   ALT 14 0 - 44 U/L   Alkaline Phosphatase 60 38 - 126 U/L   Total Bilirubin 0.6 0.3 - 1.2 mg/dL   GFR calc non Af Amer >60 >60 mL/min   GFR calc Af Amer >60 >60 mL/min   Anion gap 9 5 - 15    Comment: Performed at [facility], [address], [place] [address]  CBC     Status: Abnormal   Collection Time: 08/09/19   9:29 AM  Result Value Ref Range   WBC 10.0 4.0 - 10.5 K/uL   RBC 3.25 (L) 3.87 - 5.11 MIL/uL   Hemoglobin 9.3 (L) 12.0 - 15.0 g/dL   HCT 29.2 (L) 36.0 - 46.0 %   MCV 89.8 80.0 - 100.0 fL  MCH 28.6 26.0 - 34.0 pg   MCHC 31.8 30.0 - 36.0 g/dL   RDW 13.2 11.5 - 15.5 %   Platelets 291 150 - 400 K/uL   nRBC 0.0 0.0 - 0.2 %    Comment: Performed at [facility], [place] [address], [place], [place] [address]  Urinalysis, Routine w reflex microscopic     Status: Abnormal   Collection Time: 08/09/19  9:38 AM  Result Value Ref Range   Color, Urine YELLOW YELLOW   APPearance CLEAR CLEAR   Specific Gravity, Urine 1.019 1.005 - 1.030   pH 5.0 5.0 - 8.0   Glucose, UA NEGATIVE NEGATIVE mg/dL   Hgb urine dipstick NEGATIVE NEGATIVE   Bilirubin Urine NEGATIVE NEGATIVE   Ketones, ur NEGATIVE NEGATIVE mg/dL   Protein, ur NEGATIVE NEGATIVE mg/dL   Nitrite NEGATIVE NEGATIVE   Leukocytes,Ua TRACE (A) NEGATIVE   RBC / HPF 0-5 0 - 5 RBC/hpf   WBC, UA 11-20 0 - 5 WBC/hpf   Bacteria, UA RARE (A) NONE SEEN   Squamous Epithelial / LPF 0-5 0 - 5   Mucus PRESENT     Comment: Performed at [facility],  [address] [place], [place] [address]  Respiratory Panel by RT PCR (Flu A&B, Covid) - Nasopharyngeal Swab     Status: None   Collection Time: 08/09/19  2:15 PM   Specimen: Nasopharyngeal Swab  Result Value Ref Range   SARS Coronavirus 2 by RT PCR NEGATIVE NEGATIVE    Comment: (NOTE) SARS-CoV-2 target nucleic acids are NOT DETECTED. The SARS-CoV-2 RNA is generally detectable in upper respiratoy specimens during the acute phase of infection. The lowest concentration of SARS-CoV-2 viral copies this assay can detect is 131 copies/mL. A negative result does not preclude SARS-Cov-2 infection and should not be used as the sole basis for treatment or other patient management decisions. A negative result may occur with  improper specimen collection/handling, submission of specimen other than  nasopharyngeal swab, presence of viral mutation(s) within the areas targeted by this assay, and inadequate number of viral copies (<131 copies/mL). A negative result must be combined with clinical observations, patient history, and epidemiological information. The expected result is Negative. Fact Sheet for Patients:  PinkCheek.be Fact Sheet for Healthcare Providers:  GravelBags.it This test is not yet ap proved or cleared by the [place] FDA and  has been authorized for detection and/or diagnosis of SARS-CoV-2 by FDA under an Emergency Use Authorization (EUA). This EUA will remain  in effect (meaning this test can be used) for the duration of the COVID-19 declaration under Section 564(b)(1) of the Act, 21 U.S.C. section 360bbb-3(b)(1), unless the authorization is terminated or revoked sooner.    Influenza A by PCR NEGATIVE NEGATIVE   Influenza B by PCR NEGATIVE NEGATIVE    Comment: (NOTE) The Xpert Xpress SARS-CoV-2/FLU/RSV assay is intended as an aid in  the diagnosis of influenza from Nasopharyngeal swab specimens and  should not be used as a sole basis for treatment. Nasal washings and  aspirates are unacceptable for Xpert Xpress SARS-CoV-2/FLU/RSV  testing. Fact Sheet for Patients: PinkCheek.be Fact Sheet for Healthcare Providers: GravelBags.it This test is not yet approved or cleared by the [place] FDA and  has been authorized for detection and/or diagnosis of SARS-CoV-2 by  FDA under an Emergency Use Authorization (EUA). This EUA will remain  in effect (meaning this test can be used) for the duration of the  Covid-19 declaration under Section 564(b)(1) of the  Act, 21  U.S.C. section 360bbb-3(b)(1), unless the authorization is  terminated or revoked. Performed at [facility], [place] [address], [place], [place] [address]     MR Lumbar Spine W  Wo Contrast  Result Date: 08/09/2019 CLINICAL DATA:  Back pain and urinary incontinence. Recent lumbar surgery. EXAM: MRI LUMBAR SPINE WITHOUT AND WITH CONTRAST TECHNIQUE: Multiplanar and multiecho pulse sequences of the lumbar spine were obtained without and with intravenous contrast. CONTRAST:  21mL GADAVIST GADOBUTROL 1 MMOL/ML IV SOLN COMPARISON:  03/05/2019 FINDINGS: The study is motion degraded, including moderate motion on the axial sequences. Segmentation: Standard. Alignment: Unchanged 3 mm retrolisthesis of L1 on L2 and L2 on L3. Decreased anterolisthesis of L4 on L5 following interval fusion, now 5 mm. Vertebrae: No fracture, suspicious marrow lesion, or evidence of discitis. Interval L3-L5 PLIF. Unchanged hemangioma in the T12 vertebral body. Conus medullaris and cauda equina: Conus extends to the L1-2 level and appears normal. There is anterior displacement of the cauda equina from the inferior L1 to the L4 levels by a CSF signal intensity dorsal subdural fluid collection. Paraspinal and other soft tissues: Postoperative changes in the posterior lumbar soft tissues with dorsal epidural fluid collection in the laminectomy bed from L3-L5. Subcutaneous fluid collection in and to the right of midline from L3-L5 measures 6.2 x 2.0 x 9.2 cm (transverse x AP x craniocaudal) and demonstrates intermediate to low T1 and mixed T2 signal intensity likely reflecting in part a hematoma. Disc levels: Disc desiccation throughout the lumbar spine with unchanged moderate disc space narrowing at L1-2 and L2-3. T11-12 and T12-L1: Only imaged sagittally. Minimal disc bulging at both levels without stenosis, unchanged. L1-2: Mild disc bulging and mild facet hypertrophy without significant stenosis, unchanged. L2-3: Circumferential disc bulging eccentric to the left, a left subarticular disc extrusion with mild caudal migration, and moderate facet and ligamentum flavum hypertrophy result in mild left lateral recess stenosis  without neural foraminal stenosis, similar to prior. Anterior displacement of the nerve roots within the thecal sac and partial effacement of subarachnoid CSF by the dorsal subdural fluid collection. L3-4: Interval posterior decompression and fusion. The dorsal epidural fluid collection (with possible dorsal subdural fluid as well) results in severe spinal stenosis with complete effacement of subarachnoid CSF. Patent neural foramina. L4-5: Interval posterior decompression and fusion. Motion and susceptibility artifact limits assessment of the spinal canal on axial images. Anterolisthesis with bulging uncovered disc, a central pseudo disc extrusion, and the dorsal epidural fluid collection result in severe spinal stenosis. There is moderate residual right neural foraminal stenosis. L5-S1: Disc bulging, a left foraminal disc extrusion, and severe facet hypertrophy result in moderate left neural foraminal stenosis which has mildly progressed. No spinal stenosis. IMPRESSION: 1. 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 2. Subdural fluid collection from L1-2 to L3-4, a not infrequent finding in the immediate postoperative although subdural fluid may contribute to the spinal stenosis at L3-4. 3. Left foraminal disc extrusion at L5-S1 with moderate foraminal stenosis, mildly progressed. 4. Unchanged left subarticular disc extrusion at L2-3 with mild left lateral recess stenosis. Electronically Signed   By: Logan Bores M.D.   On: 08/09/2019 13:22    ROS Blood pressure (!) 173/95, pulse (!) 102, temperature 97.8 F (36.6 C), resp. rate 17, height 5\' 3"  (1.6 m), weight 89.4 kg, SpO2 96 %. Estimated body mass index is 34.9 kg/m as calculated from the following:   Height as  of this encounter: 5\' 3"  (1.6 m).   Weight as of this encounter: 89.4 kg.  Physical Exam  General: A 73 year old white female mildly confused in V. tach  HEENT:  Normocephalic, her pupils are approximately 6 mm bilaterally.  She has conjugate gaze.  Neck: Unremarkable  Thorax: Unremarkable  Abdomen: Soft  Neurologic exam: The patient is alert and oriented to person and hospital.  She is densely left hemiparetic.  She follows commands on the right.  Her pupils are as above.  I reviewed the patient's head CT performed at [facility].  She has a large right frontal anterior parenchymal hemorrhage with mass-effect and midline shift.  Assessment/Plan: Right frontal intraparenchymal hemorrhage, Coumadin anticoagulation, V. tach, cardiac disease: I have discussed the situation with the patient and separately with her husband.  We have discussed the various treatment options including doing nothing versus surgery.  I have described a right frontal craniotomy for evacuation of intraparenchymal hemorrhage.  We have discussed the risk including risk of anesthesia, recurrent hemorrhage, stroke, coma, death, medical risk, etc.  I have answered all the patient's husband's questions.  He understands that her prognosis is poor given all her medical issues.  He has consented on behalf of the patient.  The OR is waiting.  Caitlin [name] 08/09/2019, 6:09 PM

## 2019-08-09 NOTE — Transfer of Care (Signed)
Immediate Anesthesia Transfer of Care Note  Patient: Caitlin [name]  Procedure(s) Performed: LUMBAR WOUND DEBRIDEMENT Evacuation of  EPIDURAL HEMATOMA (N/A Spine Lumbar)  Patient Location: PACU  Anesthesia Type:General  Level of Consciousness: awake, drowsy and patient cooperative  Airway & Oxygen Therapy: Patient Spontanous Breathing and Patient connected to face mask oxygen  Post-op Assessment: Report given to RN and Post -op Vital signs reviewed and stable  Post vital signs: Reviewed and stable  Last Vitals:  Vitals Value Taken Time  BP 173/95 08/09/19 1750  Temp 36.6 C 08/09/19 1750  Pulse 102 08/09/19 1751  Resp 17 08/09/19 1752  SpO2 96 % 08/09/19 1751  Vitals shown include unvalidated device data.  Last Pain:  Vitals:   08/09/19 0938  TempSrc:   PainSc: 7          Complications: No apparent anesthesia complications

## 2019-08-09 NOTE — ED Triage Notes (Signed)
Pt being sent by Dr. Ronnald Ramp from [facility] for possible UTI and MRI. The patient is 5 days post op from back surgery. She reports feeling an ache in her kidneys along with loss of control of her bladder at times. Post op pain being managed by pain meds and she has already received 1 round of antibiotics pre operatively. NAD.

## 2019-08-09 NOTE — ED Notes (Signed)
Called Husband Raphaella Haskin and he is aware patient will be going to the or.

## 2019-08-09 NOTE — Progress Notes (Signed)
Providing Compassionate, Quality Care - Together   Subjective: Patient reports pain and weakness in lower extremities. She reports episodes of incontinence prior to ED arrival.  Objective: Vital signs in last 24 hours: Temp:  [98.8 F (37.1 C)] 98.8 F (37.1 C) (12/21 0915) Pulse Rate:  [87-94] 94 (12/21 1235) Resp:  [12-16] 12 (12/21 1235) BP: (156-160)/(69-81) 160/69 (12/21 1235) SpO2:  [94 %-100 %] 94 % (12/21 1235) Weight:  [89.4 kg] 89.4 kg (12/21 0919)  Intake/Output from previous day: No intake/output data recorded. Intake/Output this shift: No intake/output data recorded.  Alert and oriented x 4 PERRLA CN II-XII grossly intact MAE, Strength and sensation intact Incision is covered with Honeycomb dressing and Steri Strips; Dressing is clean, dry, and intact    Lab Results: Recent Labs    08/09/19 0929  WBC 10.0  HGB 9.3*  HCT 29.2*  PLT 291   BMET Recent Labs    08/09/19 0929  NA 139  K 4.2  CL 100  CO2 30  GLUCOSE 144*  BUN 11  CREATININE 0.75  CALCIUM 9.0    Studies/Results: MR Lumbar Spine W Wo Contrast  Result Date: 08/09/2019 CLINICAL DATA:  Back pain and urinary incontinence. Recent lumbar surgery. EXAM: MRI LUMBAR SPINE WITHOUT AND WITH CONTRAST TECHNIQUE: Multiplanar and multiecho pulse sequences of the lumbar spine were obtained without and with intravenous contrast. CONTRAST:  62mL GADAVIST GADOBUTROL 1 MMOL/ML IV SOLN COMPARISON:  03/05/2019 FINDINGS: The study is motion degraded, including moderate motion on the axial sequences. Segmentation: Standard. Alignment: Unchanged 3 mm retrolisthesis of L1 on L2 and L2 on L3. Decreased anterolisthesis of L4 on L5 following interval fusion, now 5 mm. Vertebrae: No fracture, suspicious marrow lesion, or evidence of discitis. Interval L3-L5 PLIF. Unchanged hemangioma in the T12 vertebral body. Conus medullaris and cauda equina: Conus extends to the L1-2 level and appears normal. There is anterior  displacement of the cauda equina from the inferior L1 to the L4 levels by a CSF signal intensity dorsal subdural fluid collection. Paraspinal and other soft tissues: Postoperative changes in the posterior lumbar soft tissues with dorsal epidural fluid collection in the laminectomy bed from L3-L5. Subcutaneous fluid collection in and to the right of midline from L3-L5 measures 6.2 x 2.0 x 9.2 cm (transverse x AP x craniocaudal) and demonstrates intermediate to low T1 and mixed T2 signal intensity likely reflecting in part a hematoma. Disc levels: Disc desiccation throughout the lumbar spine with unchanged moderate disc space narrowing at L1-2 and L2-3. T11-12 and T12-L1: Only imaged sagittally. Minimal disc bulging at both levels without stenosis, unchanged. L1-2: Mild disc bulging and mild facet hypertrophy without significant stenosis, unchanged. L2-3: Circumferential disc bulging eccentric to the left, a left subarticular disc extrusion with mild caudal migration, and moderate facet and ligamentum flavum hypertrophy result in mild left lateral recess stenosis without neural foraminal stenosis, similar to prior. Anterior displacement of the nerve roots within the thecal sac and partial effacement of subarachnoid CSF by the dorsal subdural fluid collection. L3-4: Interval posterior decompression and fusion. The dorsal epidural fluid collection (with possible dorsal subdural fluid as well) results in severe spinal stenosis with complete effacement of subarachnoid CSF. Patent neural foramina. L4-5: Interval posterior decompression and fusion. Motion and susceptibility artifact limits assessment of the spinal canal on axial images. Anterolisthesis with bulging uncovered disc, a central pseudo disc extrusion, and the dorsal epidural fluid collection result in severe spinal stenosis. There is moderate residual right neural foraminal stenosis. L5-S1:  Disc bulging, a left foraminal disc extrusion, and severe facet  hypertrophy result in moderate left neural foraminal stenosis which has mildly progressed. No spinal stenosis. IMPRESSION: 1. 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 2. Subdural fluid collection from L1-2 to L3-4, a not infrequent finding in the immediate postoperative although subdural fluid may contribute to the spinal stenosis at L3-4. 3. Left foraminal disc extrusion at L5-S1 with moderate foraminal stenosis, mildly progressed. 4. Unchanged left subarticular disc extrusion at L2-3 with mild left lateral recess stenosis. Electronically Signed   By: Logan Bores M.D.   On: 08/09/2019 13:22    Assessment/Plan: Patient underwent an L3-4 and L4-L5 PLIF by Dr. Ronnald Ramp on 08/04/2019. Her procedure and post-operative course were uncomplicated and she was discharged on 08/05/2019. Over the past two days, she has been experiencing increasing pain and subjective weakness. She reports episodes of incontinence, but denies saddle paresthesias. She returned to the ED on 08/09/2019. MRI revealed a dorsal epidural fluid collection resulting in severe spinal stenosis at L3-4 and L4-5.   LOS: 0 days    -Dr. Arnoldo Morale is preparing to take patient to OR for epidural hematoma evacuation -Keep patient NPO -H and P and admission orders are forthcoming   Viona Gilmore, DNP, AGNP-C Nurse Practitioner  [facility] [place]. [address], [place], [place], [place] [address] P: [phone]    F: [phone]  08/09/2019, 1:47 PM

## 2019-08-09 NOTE — Op Note (Signed)
Brief history: The patient is a 73 year old white female on Xarelto on whom Dr. Ronnald Ramp performed a lumbar decompression and fusion few days ago.  She has developed urinary retention and some leg weakness.  She was worked up with a lumbar MRI which demonstrated an epidural hematoma and severe stenosis at L3-4 and L4-5.  I recommended surgery.  The patient has weighed the risks, benefits and alternatives and decided proceed with an evacuation of her epidural hematoma.  Preop diagnosis: Lumbar epidural hematoma  Postop diagnosis: The same  Procedure: Evacuation of lumbar epidural hematoma  Surgeon: Dr. Earle Gell  Assistant: None  Anesthesia: General tracheal  Estimated blood loss: Minimal  Specimens: None  Drains: 1 medium Hemovac in the epidural space  Complications: None  Description of procedure: The patient was brought to the operating room by the anesthesia team.  General endotracheal anesthesia was induced.  The patient was turned to the prone position on the Wilson frame.  Her lumbosacral region was then prepared with Betadine scrub and Betadine solution.  Sterile drapes were applied.  I then incised through the patient's fresh surgical scar with the scalpel.  I ligated the sutures with the Mayo scissors.  We encountered a moderately large hematoma in the subcutaneous space which I removed with suction and irrigation.  I then used the Mayo's scissors to incise the sutures in the fascia.  I inserted the cerebellar retractors for exposure.  There was an epidural hematoma.  I removed it with suction irrigation decompressing the thecal sac.  I did not see any active bleeding points.  Then placed a medium Hemovac drain in the epidural space and tunneled it out through a separate stab wound.  We irrigated the wound out with bacitracin solution.  I then remove the cerebellar retractors.  I reapproximated patient's lumbar fascia with interrupted 0 Vicryl suture.  I reapproximated subcutaneous  tissue with interrupted 2-0 Vicryl suture.  I reapproximated the skin with Steri-Strips and benzoin.  The wound was then coated with bacitracin ointment.  A sterile dressing was applied.  The drapes were removed.  The patient was separately returned to the supine position.  By report all sponge, instrument, and needle counts were correct at the end of this case.

## 2019-08-09 NOTE — Anesthesia Postprocedure Evaluation (Signed)
Anesthesia Post Note  Patient: Caitlin [name]  Procedure(s) Performed: LUMBAR WOUND DEBRIDEMENT Evacuation of  EPIDURAL HEMATOMA (N/A Spine Lumbar)     Patient location during evaluation: PACU Anesthesia Type: General Level of consciousness: awake and alert Pain management: pain level controlled Vital Signs Assessment: post-procedure vital signs reviewed and stable Respiratory status: spontaneous breathing, nonlabored ventilation, respiratory function stable and patient connected to nasal cannula oxygen Cardiovascular status: blood pressure returned to baseline and stable Postop Assessment: no apparent nausea or vomiting Anesthetic complications: no    Last Vitals:  Vitals:   08/09/19 1905 08/09/19 1941  BP: (!) 167/83 (!) 158/85  Pulse: 91 88  Resp:    Temp: (!) 36.3 C 37.1 C  SpO2: 94%     Last Pain:  Vitals:   08/09/19 1941  TempSrc: Oral  PainSc:                  Lissette Schenk P Josejulian Tarango

## 2019-08-09 NOTE — ED Notes (Signed)
Nurse Navigator checked on pt and offered to help her reach family, update family.  Pt declines.  EDP had spoken with family.  Helped pt go to the bathroom where she voided, pt did not have any difficulty with ambulation.  Sheets changed and brought pt some linens to help her reposition to lay on side for comfort.  Updated pt on POC, including NPO until surgeon sees her.

## 2019-08-09 NOTE — Anesthesia Preprocedure Evaluation (Addendum)
Anesthesia Evaluation  Patient identified by MRN, date of birth, ID band Patient awake    Reviewed: Allergy & Precautions, NPO status , Patient's Chart, lab work & pertinent test results  History of Anesthesia Complications (+) PONV  Airway Mallampati: IV  TM Distance: >3 FB Neck ROM: Full    Dental no notable dental hx.    Pulmonary asthma , sleep apnea , COPD,  COPD inhaler,    Pulmonary exam normal breath sounds clear to auscultation       Cardiovascular hypertension, Pt. on medications Normal cardiovascular exam+ dysrhythmias Atrial Fibrillation  Rhythm:Regular Rate:Normal  Sees cardiologist in [facility] Ola Spurr)   Neuro/Psych PSYCHIATRIC DISORDERS Depression negative neurological ROS     GI/Hepatic Neg liver ROS, GERD  Medicated and Controlled,  Endo/Other  diabetes, Insulin Dependent  Renal/GU negative Renal ROS     Musculoskeletal  (+) Arthritis , RLS (restless legs syndrome)   Abdominal (+) + obese,   Peds  Hematology negative hematology ROS (+) anemia ,   Anesthesia Other Findings lumbar wound  Reproductive/Obstetrics                           Anesthesia Physical Anesthesia Plan  ASA: III and emergent  Anesthesia Plan: General   Post-op Pain Management:    Induction: Intravenous  PONV Risk Score and Plan: 3 and Ondansetron, Dexamethasone, Midazolam, Treatment may vary due to age or medical condition and Propofol infusion  Airway Management Planned: Oral ETT and Video Laryngoscope Planned  Additional Equipment:   Intra-op Plan:   Post-operative Plan: Extubation in OR  Informed Consent: I have reviewed the patients History and Physical, chart, labs and discussed the procedure including the risks, benefits and alternatives for the proposed anesthesia with the patient or authorized representative who has indicated his/her understanding and acceptance.     Dental  advisory given  Plan Discussed with: CRNA  Anesthesia Plan Comments:        Anesthesia Quick Evaluation

## 2019-08-09 NOTE — ED Notes (Signed)
Patient transported to MRI 

## 2019-08-10 LAB — GLUCOSE, CAPILLARY
Glucose-Capillary: 163 mg/dL — ABNORMAL HIGH (ref 70–99)
Glucose-Capillary: 155 mg/dL — ABNORMAL HIGH (ref 70–99)
Glucose-Capillary: 107 mg/dL — ABNORMAL HIGH (ref 70–99)
Glucose-Capillary: 136 mg/dL — ABNORMAL HIGH (ref 70–99)
Glucose-Capillary: 159 mg/dL — ABNORMAL HIGH (ref 70–99)
Glucose-Capillary: 191 mg/dL — ABNORMAL HIGH (ref 70–99)
Glucose-Capillary: 162 mg/dL — ABNORMAL HIGH (ref 70–99)

## 2019-08-10 LAB — CBC
HCT: 28.4 % — ABNORMAL LOW (ref 36.0–46.0)
Hemoglobin: 9.4 g/dL — ABNORMAL LOW (ref 12.0–15.0)
MCH: 28.9 pg (ref 26.0–34.0)
MCHC: 33.1 g/dL (ref 30.0–36.0)
MCV: 87.4 fL (ref 80.0–100.0)
Platelets: 315 10*3/uL (ref 150–400)
RBC: 3.25 MIL/uL — ABNORMAL LOW (ref 3.87–5.11)
RDW: 12.9 % (ref 11.5–15.5)
WBC: 10 10*3/uL (ref 4.0–10.5)
nRBC: 0 % (ref 0.0–0.2)

## 2019-08-10 LAB — BASIC METABOLIC PANEL
Anion gap: 14 (ref 5–15)
BUN: 16 mg/dL (ref 8–23)
CO2: 26 mmol/L (ref 22–32)
Calcium: 9.3 mg/dL (ref 8.9–10.3)
Chloride: 99 mmol/L (ref 98–111)
Creatinine, Ser: 0.86 mg/dL (ref 0.44–1.00)
GFR calc Af Amer: >60 mL/min (ref >60)
GFR calc non Af Amer: >60 mL/min (ref >60)
Glucose, Bld: 120 mg/dL — ABNORMAL HIGH (ref 70–99)
Potassium: 4 mmol/L (ref 3.5–5.1)
Sodium: 139 mmol/L (ref 135–145)

## 2019-08-10 LAB — URINE CULTURE

## 2019-08-10 NOTE — Evaluation (Signed)
Occupational Therapy Evaluation Patient Details Name: Caitlin [name] MRN: [id number] DOB: 27-May-1946 Today's Date: 08/10/2019    History of Present Illness 73 yo female admitted to [facility] on 12/21 for urinary urgency and incotinence, increasing back and LE pain s/p PLIF L3-L4 L4-L5 on 08/04/19. Pt s/p evacuation of lumbar epidural hematoma on 12/21. PMH includes afib, HTN, COPD, depression, asthma, DM with neuropathy, OSA.   Clinical Impression   Pt with decline in function and safety with ADLs and ADL mobility with impaired strength, balance and endurance. Pt's husband present this session and will be assisting pt at home. PTA, pt was independent with ADLs/selfcare and mobility. Pt currently  requires min A with LB self care, min guard A with toileting and min guard A with transfers. Pt would benefit from acute OT services to address impairments to maximize level of function and safety    Follow Up Recommendations  No OT follow up;Supervision/Assistance - 24 hour    Equipment Recommendations  3 in 1 bedside commode;Other (comment)(reacher, LH bath sponge)    Recommendations for Other Services       Precautions / Restrictions Precautions Precautions: Fall Precaution Booklet Issued: Yes (comment) Precaution Comments: reviewed handout with pt (NO bending, arching, lifting or twisting), log roll technique Required Braces or Orthoses: Spinal Brace Spinal Brace: Lumbar corset;Applied in sitting position Restrictions Weight Bearing Restrictions: No      Mobility Bed Mobility Overal bed mobility: Needs Assistance Bed Mobility: Rolling;Sidelying to Sit;Sit to Sidelying Rolling: Min guard Sidelying to sit: Min guard     Sit to sidelying: Min guard General bed mobility comments: min guard for safety, verbal cuing for log roll technique. Pt with use of bedrails to complete.  Transfers Overall transfer level: Needs assistance Equipment used: Rolling walker (2 wheeled) Transfers: Sit  to/from Stand Sit to Stand: Min guard         General transfer comment: Min guard for safety, verbal cuing for hand placement when rising.    Balance Overall balance assessment: Needs assistance Sitting-balance support: Feet supported Sitting balance-Leahy Scale: Good     Standing balance support: No upper extremity supported;Single extremity supported;During functional activity Standing balance-Leahy Scale: Fair Standing balance comment: able to stand without UE support, benefits from RW assist dynamically                           ADL either performed or assessed with clinical judgement   ADL Overall ADL's : Needs assistance/impaired Eating/Feeding: Sitting;Set up;Independent   Grooming: Set up;Supervision/safety;Sitting   Upper Body Bathing: Set up;Supervision/ safety;Sitting;With caregiver independent assisting   Lower Body Bathing: Minimal assistance;With caregiver independent assisting   Upper Body Dressing : Supervision/safety;Set up;Sitting;With caregiver independent assisting   Lower Body Dressing: Minimal assistance;With caregiver independent assisting   Toilet Transfer: Min guard;Ambulation;RW;BSC   Toileting- Water quality scientist and Hygiene: Min guard;Sit to/from stand       Functional mobility during ADLs: Min guard;Rolling walker       Vision Patient Visual Report: No change from baseline       Perception     Praxis      Pertinent Vitals/Pain Pain Assessment: 0-10 Pain Score: 5  Pain Location: back, incisional Pain Descriptors / Indicators: Discomfort;Sore Pain Intervention(s): Monitored during session;Premedicated before session;Repositioned     Hand Dominance Right   Extremity/Trunk Assessment Upper Extremity Assessment Upper Extremity Assessment: Overall WFL for tasks assessed   Lower Extremity Assessment Lower Extremity Assessment: Defer to PT evaluation RLE  Deficits / Details: able to perform AROM DF/PF, quad set,  heel slide RLE Sensation: history of peripheral neuropathy LLE Deficits / Details: able to perform AROM DF/PF, quad set, heel slide LLE Sensation: history of peripheral neuropathy   Cervical / Trunk Assessment Cervical / Trunk Assessment: Other exceptions Cervical / Trunk Exceptions: s/p lumbar surgery   Communication Communication Communication: No difficulties   Cognition Arousal/Alertness: Awake/alert Behavior During Therapy: WFL for tasks assessed/performed Overall Cognitive Status: Within Functional Limits for tasks assessed                                     General Comments       Exercises     Shoulder Instructions      Home Living Family/patient expects to be discharged to:: Private residence Living Arrangements: Spouse/significant other Available Help at Discharge: Family;Available 24 hours/day Type of Home: House Home Access: Stairs to enter [employer] of Steps: 3 Entrance Stairs-Rails: Right Home Layout: One level     Bathroom Shower/Tub: Occupational psychologist: Standard     Home Equipment: None          Prior Functioning/Environment Level of Independence: Needs assistance  Gait / Transfers Assistance Needed: pt not using AD for ambulation PTA, but states "things were challenging" ADL's / Homemaking Assistance Needed: Pt reports occasionally requiring assist from husband for in/out of bed but was independent with ADLs            OT Problem List: Decreased strength;Decreased activity tolerance;Impaired balance (sitting and/or standing);Decreased knowledge of use of DME or AE;Decreased knowledge of precautions;Pain      OT Treatment/Interventions: Self-care/ADL training;DME and/or AE instruction;Therapeutic activities;Patient/family education;Balance training    OT Goals(Current goals can be found in the care plan section) Acute Rehab OT Goals Patient Stated Goal: go home with husband and dogs OT Goal  Formulation: With patient Time For Goal Achievement: 08/24/19 Potential to Achieve Goals: Good ADL Goals Pt Will Perform Grooming: with supervision;with set-up;standing;with caregiver independent in assisting Pt Will Perform Lower Body Bathing: with min guard assist;with adaptive equipment;with caregiver independent in assisting Pt Will Perform Lower Body Dressing: with min guard assist;with adaptive equipment;with caregiver independent in assisting Pt Will Transfer to Toilet: with supervision;with modified independence;ambulating Pt Will Perform Toileting - Clothing Manipulation and hygiene: with supervision;with caregiver independent in assisting;sit to/from stand  OT Frequency: Min 2X/week   Barriers to D/C:    no barriers       Co-evaluation              AM-PAC OT "6 Clicks" Daily Activity     Outcome Measure Help from another person eating meals?: None Help from another person taking care of personal grooming?: A Little Help from another person toileting, which includes using toliet, bedpan, or urinal?: A Little Help from another person bathing (including washing, rinsing, drying)?: A Little Help from another person to put on and taking off regular upper body clothing?: None Help from another person to put on and taking off regular lower body clothing?: A Little 6 Click Score: 20   End of Session Equipment Utilized During Treatment: Back brace;Gait belt;Other (comment)(3 in 1)  Activity Tolerance: Patient tolerated treatment well Patient left: with call bell/phone within reach;in bed;with family/visitor present  OT Visit Diagnosis: Other abnormalities of gait and mobility (R26.89);Pain;Unsteadiness on feet (R26.81) Pain - part of body: (back)  Time: [id number] OT Time Calculation (min): 32 min Charges:  OT General Charges $OT Visit: 1 Visit OT Evaluation $OT Eval Low Complexity: 1 Low OT Treatments $Self Care/Home Management : 8-22  mins    Britt Bottom 08/10/2019, 2:14 PM

## 2019-08-10 NOTE — Progress Notes (Signed)
Pt. Had Diltiazem dose due at 1800. Medication not available and requested to be sent up by pharmacy. RN went to room to give medication when arrived at [place]. BP taken initially at 106/39. Pt. Lying on side and groggy. BP retaken on other arm at 100's over 40's. Pt. Sat up, given something to eat and drink. RN checked blood glucose as well because pt. Did not eat dinner even though she wanted her insulin dose. Blood glucose: 191. Last BP taken on left arm at 121/50's. Diltiazem not given and handed off to night RN during report to potentially be given once patient's pressure improve or at the night RN's discretion.

## 2019-08-10 NOTE — Progress Notes (Signed)
Subjective: The patient is alert and pleasant.  She feels much better today.  The Foley is out.  She has urinated without difficulty.  Objective: Vital signs in last 24 hours: Temp:  [97.4 F (36.3 C)-98.8 F (37.1 C)] 98.3 F (36.8 C) (12/22 0346) Pulse Rate:  [86-102] 90 (12/22 0346) Resp:  [12-21] 21 (12/21 1820) BP: (154-173)/(69-95) 158/87 (12/22 0346) SpO2:  [94 %-100 %] 96 % (12/22 0346) Weight:  [89.4 kg] 89.4 kg (12/21 0919) Estimated body mass index is 34.9 kg/m as calculated from the following:   Height as of this encounter: 5\' 3"  (1.6 m).   Weight as of this encounter: 89.4 kg.   Intake/Output from previous day: 12/21 0701 - 12/22 0700 In: 852.9 [I.V.:652.9; IV Piggyback:200] Out: 275 [Urine:150; Drains:25; Blood:100] Intake/Output this shift: No intake/output data recorded.  Physical exam the patient is alert and pleasant.  She looks much better.  Her lower extremity strength is normal.  Lab Results: Recent Labs    08/09/19 0929  WBC 10.0  HGB 9.3*  HCT 29.2*  PLT 291   BMET Recent Labs    08/09/19 0929  NA 139  K 4.2  CL 100  CO2 30  GLUCOSE 144*  BUN 11  CREATININE 0.75  CALCIUM 9.0    Studies/Results: MR Lumbar Spine W Wo Contrast  Result Date: 08/09/2019 CLINICAL DATA:  Back pain and urinary incontinence. Recent lumbar surgery. EXAM: MRI LUMBAR SPINE WITHOUT AND WITH CONTRAST TECHNIQUE: Multiplanar and multiecho pulse sequences of the lumbar spine were obtained without and with intravenous contrast. CONTRAST:  29mL GADAVIST GADOBUTROL 1 MMOL/ML IV SOLN COMPARISON:  03/05/2019 FINDINGS: The study is motion degraded, including moderate motion on the axial sequences. Segmentation: Standard. Alignment: Unchanged 3 mm retrolisthesis of L1 on L2 and L2 on L3. Decreased anterolisthesis of L4 on L5 following interval fusion, now 5 mm. Vertebrae: No fracture, suspicious marrow lesion, or evidence of discitis. Interval L3-L5 PLIF. 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 Conus medullaris and cauda equina: Conus extends to the L1-2 level and appears normal. There is anterior displacement of the cauda equina from the inferior L1 to the L4 levels by a CSF signal intensity dorsal subdural fluid collection. Paraspinal and other soft tissues: Postoperative changes in the posterior lumbar soft tissues with dorsal epidural fluid collection in the laminectomy bed from L3-L5. Subcutaneous fluid collection in and to the right of midline from L3-L5 measures 6.2 x 2.0 x 9.2 cm (transverse x AP x craniocaudal) and demonstrates intermediate to low T1 and mixed T2 signal intensity likely reflecting in part a hematoma. Disc levels: Disc desiccation throughout the lumbar spine with unchanged moderate disc space narrowing at L1-2 and L2-3. T11-12 and T12-L1: Only imaged sagittally. Minimal disc bulging at both levels without stenosis, unchanged. L1-2: Mild disc bulging and mild facet hypertrophy without significant stenosis, unchanged. L2-3: Circumferential disc bulging eccentric to the left, a left subarticular disc extrusion with mild caudal migration, and moderate facet and ligamentum flavum hypertrophy result in mild left lateral recess stenosis without neural foraminal stenosis, similar to prior. Anterior displacement of the nerve roots within the thecal sac and partial effacement of subarachnoid CSF by the dorsal subdural fluid collection. L3-4: Interval posterior decompression and fusion. The dorsal epidural fluid collection (with possible dorsal subdural fluid as well) results in severe spinal stenosis with complete effacement of subarachnoid CSF. Patent neural foramina. L4-5: Interval posterior decompression and fusion. Motion and susceptibility artifact limits assessment of the spinal canal on  axial images. Anterolisthesis with bulging uncovered disc, a central pseudo disc extrusion, and the dorsal epidural fluid collection result in severe spinal stenosis. There is  moderate residual right neural foraminal stenosis. L5-S1: Disc bulging, a left foraminal disc extrusion, and severe facet hypertrophy result in moderate left neural foraminal stenosis which has mildly progressed. No spinal stenosis. IMPRESSION: 1. 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 2. Subdural fluid collection from L1-2 to L3-4, a not infrequent finding in the immediate postoperative although subdural fluid may contribute to the spinal stenosis at L3-4. 3. Left foraminal disc extrusion at L5-S1 with moderate foraminal stenosis, mildly progressed. 4. Unchanged left subarticular disc extrusion at L2-3 with mild left lateral recess stenosis. Electronically Signed   By: Logan Bores M.D.   On: 08/09/2019 13:22    Assessment/Plan: Postop day #1: The patient is doing much better status post evacuation of hematoma.  She will likely go home tomorrow.  LOS: 1 day     Ophelia Charter 08/10/2019, 7:58 AM

## 2019-08-10 NOTE — Evaluation (Signed)
Physical Therapy Evaluation Patient Details Name: Caitlin [name] MRN: [id number] DOB: 1945-10-19 Today's Date: 08/10/2019   History of Present Illness  73 yo female admitted to [facility] on 12/21 for urinary urgency and incotinence, increasing back and LE pain s/p PLIF L3-L4 L4-L5 on 08/04/19. Pt s/p evacuation of lumbar epidural hematoma on 12/21. PMH includes afib, HTN, COPD, depression, asthma, DM with neuropathy, OSA.  Clinical Impression   Pt presents with moderate back pain post-operatively, increased time and effort to mobilize, some decreased application of back precautions during mobility, mild unsteadiness in standing, and decreased activity tolerance. Pt to benefit from acute PT to address deficits. Pt ambulated hallway distance with use of RW for pt self-steadying and posture, PT recommending both RW and BSC for pt upon Caitlin/c (orders for 3in1 last hospitalization not filled). PT to progress mobility as tolerated, and will continue to follow acutely.      Follow Up Recommendations No PT follow up;Supervision for mobility/OOB    Equipment Recommendations  Rolling walker with 5" wheels;3in1 (PT)    Recommendations for Other Services       Precautions / Restrictions Precautions Precautions: Fall Precaution Booklet Issued: No Precaution Comments: Pt has handout; pt able to state no twisting, lifting, bending spine with use of log roll technique in and out of bed. Pt with good knowledge and demonstration of application of lumbar corset, PT with verbal reminders for no arching or bending back during mobility. Required Braces or Orthoses: Spinal Brace Spinal Brace: Lumbar corset;Applied in sitting position Restrictions Weight Bearing Restrictions: No      Mobility  Bed Mobility Overal bed mobility: Needs Assistance Bed Mobility: Rolling;Sidelying to Sit Rolling: Min guard Sidelying to sit: Min guard       General bed mobility comments: min guard for safety, verbal cuing for log  roll technique. Pt with use of bedrails to complete.  Transfers Overall transfer level: Needs assistance Equipment used: Rolling walker (2 wheeled) Transfers: Sit to/from Stand Sit to Stand: Min guard         General transfer comment: Min guard for safety, verbal cuing for hand placement when rising.  Ambulation/Gait Ambulation/Gait assistance: Min guard Gait Distance (Feet): 120 Feet Assistive device: Rolling walker (2 wheeled) Gait Pattern/deviations: Step-through pattern;Decreased stride length;Trunk flexed Gait velocity: decr   General Gait Details: min guard for safety, verbal cuing for upright posture x1, otherwise pt with self-cuing to stand upright. Pt reports RW feels supportive during ambulation post-operatively.  Stairs            Wheelchair Mobility    Modified Rankin (Stroke Patients Only)       Balance Overall balance assessment: Needs assistance Sitting-balance support: Feet supported Sitting balance-Leahy Scale: Good     Standing balance support: No upper extremity supported;Single extremity supported;During functional activity Standing balance-Leahy Scale: Fair Standing balance comment: able to stand without UE support, benefits from RW assist dynamically                             Pertinent Vitals/Pain Pain Assessment: 0-10 Pain Score: 5  Pain Location: back, incisional Pain Descriptors / Indicators: Discomfort;Sore Pain Intervention(s): Limited activity within patient's tolerance;Monitored during session;Premedicated before session;Repositioned    Home Living Family/patient expects to be discharged to:: Private residence Living Arrangements: Spouse/significant other Available Help at Discharge: Family;Available 24 hours/day Type of Home: House Home Access: Stairs to enter Entrance Stairs-Rails: Right Entrance Stairs-Number of Steps: 3 Home Layout: One level  Home Equipment: None      Prior Function Level of Independence:  Needs assistance   Gait / Transfers Assistance Needed: pt not using AD for ambulation PTA, but states "things were challenging"  ADL's / Homemaking Assistance Needed: Pt reports occasionally requiring assist from husband for in/out of bed        Hand Dominance   Dominant Hand: Right    Extremity/Trunk Assessment   Upper Extremity Assessment Upper Extremity Assessment: Defer to OT evaluation    Lower Extremity Assessment Lower Extremity Assessment: RLE deficits/detail;LLE deficits/detail RLE Deficits / Details: able to perform AROM DF/PF, quad set, heel slide RLE Sensation: history of peripheral neuropathy LLE Deficits / Details: able to perform AROM DF/PF, quad set, heel slide LLE Sensation: history of peripheral neuropathy    Cervical / Trunk Assessment Cervical / Trunk Assessment: Other exceptions Cervical / Trunk Exceptions: s/p lumbar surgery  Communication   Communication: No difficulties  Cognition Arousal/Alertness: Awake/alert Behavior During Therapy: WFL for tasks assessed/performed Overall Cognitive Status: Within Functional Limits for tasks assessed                                        General Comments      Exercises     Assessment/Plan    PT Assessment Patient needs continued PT services  PT Problem List Decreased strength;Decreased activity tolerance;Decreased balance;Decreased mobility;Decreased knowledge of use of DME;Decreased safety awareness;Decreased knowledge of precautions;Pain       PT Treatment Interventions DME instruction;Gait training;Stair training;Functional mobility training;Therapeutic activities;Therapeutic exercise;Neuromuscular re-education;Patient/family education    PT Goals (Current goals can be found in the Care Plan section)  Acute Rehab PT Goals Patient Stated Goal: go home with husband and dogs PT Goal Formulation: With patient Time For Goal Achievement: 08/17/19 Potential to Achieve Goals: Good     Frequency Min 3X/week   Barriers to discharge        Co-evaluation               AM-PAC PT "6 Clicks" Mobility  Outcome Measure Help needed turning from your back to your side while in a flat bed without using bedrails?: A Little Help needed moving from lying on your back to sitting on the side of a flat bed without using bedrails?: A Little Help needed moving to and from a bed to a chair (including a wheelchair)?: A Little Help needed standing up from a chair using your arms (e.g., wheelchair or bedside chair)?: A Little Help needed to walk in hospital room?: A Little Help needed climbing 3-5 steps with a railing? : A Little 6 Click Score: 18    End of Session Equipment Utilized During Treatment: Back brace Activity Tolerance: Patient limited by pain(and nausea) Patient left: in chair;with call bell/phone within reach(pt verbally agrees to press call button and wait for assist prior to mobilizing back to bed.) Nurse Communication: Mobility status PT Visit Diagnosis: Pain;Other abnormalities of gait and mobility (R26.89) Pain - part of body: (back)    Time: [id number] PT Time Calculation (min) (ACUTE ONLY): 27 min   Charges:   PT Evaluation $PT Eval Low Complexity: 1 Low PT Treatments $Gait Training: 8-22 mins        Caitlin [name] E, PT Acute Rehabilitation Services Pager [phone]  Office [phone]   Caitlin [name] Caitlin [name] 08/10/2019, 10:45 AM

## 2019-08-10 NOTE — Progress Notes (Signed)
B/p showed some improvement from earlier. Patient now refuses Diltiazem dose at this time. Patient states "I take it usually a lot earlier and wanted to be careful since my pressures dropped." b/p at this time 139/66 MAP 91. Was taken while patient sitting on the side of bed.  Noted to have lower diastolic pressures while laying down.

## 2019-08-11 LAB — GLUCOSE, CAPILLARY
Glucose-Capillary: 105 mg/dL — ABNORMAL HIGH (ref 70–99)
Glucose-Capillary: 125 mg/dL — ABNORMAL HIGH (ref 70–99)
Glucose-Capillary: 142 mg/dL — ABNORMAL HIGH (ref 70–99)

## 2019-08-11 MED ORDER — OXYCODONE HCL 5 MG PO TABS: 5.0000 mg | ORAL_TABLET | ORAL | 0 refills | Status: DC | PRN

## 2019-08-11 MED ORDER — BISACODYL 10 MG RE SUPP: 10.0000 mg | Freq: Every day | RECTAL | 0 refills | Status: DC | PRN

## 2019-08-11 NOTE — TOC Transition Note (Signed)
Transition of Care [facility]) - CM/SW Discharge Note   Patient Details  Name: Caitlin [name] MRN: [id number] Date of Birth: 1945-09-28  Transition of Care [facility]) CM/SW Contact:  Ella Bodo, RN Phone Number: 08/11/2019, 4:47 PM   Clinical Narrative:  73 yo female admitted to [facility] on 12/21 for urinary urgency and incotinence, increasing back and LE pain s/p PLIF L3-L4 L4-L5 on 08/04/19. Pt s/p evacuation of lumbar epidural hematoma on 12/21.  PTA, pt needs assistance with ADLS; lives with spouse.  Pt for dc home today with husband to assista with care.  PT/OT recommending no OP follow up, DME for home.  Referral to [place] for DME needs.       Final next level of care: Home/Self Care Barriers to Discharge: Barriers Resolved   Patient Goals and CMS Choice        Discharge Placement                       Discharge Plan and Services   Discharge Planning Services: CM Consult            DME Arranged: 3-N-1, [place]ford Rile DME Agency: [employer] Date DME Agency Contacted: 08/11/19 Time DME Agency Contacted: [phone] Representative spoke with at DME Agency: [place] ([place]) Interventions     Readmission Risk Interventions No flowsheet data found.  Reinaldo Raddle, RN, BSN  Trauma/Neuro ICU Case Manager [phone]

## 2019-08-11 NOTE — Progress Notes (Signed)
Occupational Therapy Treatment Patient Details Name: Caitlin [name] MRN: [id number] DOB: 01/12/1946 Today's Date: 08/11/2019    History of present illness 73 yo female admitted to [facility] on 12/21 for urinary urgency and incotinence, increasing back and LE pain s/p PLIF L3-L4 L4-L5 on 08/04/19. Pt s/p evacuation of lumbar epidural hematoma on 12/21. PMH includes afib, HTN, COPD, depression, asthma, DM with neuropathy, OSA.   OT comments  Pt making steady progress towards OT goals this session. Overall, pt requires min guard for functional mobility with RW, MIN- MOD A for UB ADL and MOD A for LB ADLs. Provided education on all LB AE for bathing and dressing with pt verbalizing understanding. Visually demo'ed use of 3n1 as shower seat for walkin shower. Pt completed functional mobility from EOB>bathroom with RW and completed hygiene and toilet transfer with supervision. Pt with good carryover of log roll technique and able to incorporate back precautions into ADL routine functionally. Pt likely to DC home today with no OT f/u. Will continue to follow acutely per POC.   Follow Up Recommendations  No OT follow up;Supervision/Assistance - 24 hour    Equipment Recommendations  3 in 1 bedside commode;Other (comment)(reacher, LH bath sponge)    Recommendations for Other Services      Precautions / Restrictions Precautions Precautions: Fall Precaution Booklet Issued: Yes (comment) Precaution Comments: able to recall 3/3 precautions Required Braces or Orthoses: Spinal Brace Spinal Brace: Lumbar corset;Applied in sitting position Restrictions Weight Bearing Restrictions: No       Mobility Bed Mobility Overal bed mobility: Needs Assistance Bed Mobility: Rolling;Sidelying to Sit;Sit to Sidelying Rolling: Min guard Sidelying to sit: Min assist     Sit to sidelying: Min guard General bed mobility comments: min guard for safety, MIN A for sidelying to sit to assist with elevating trunk. good  technique noted  Transfers Overall transfer level: Needs assistance Equipment used: Rolling walker (2 wheeled) Transfers: Sit to/from Stand Sit to Stand: Supervision         General transfer comment: supervision for safety    Balance Overall balance assessment: Needs assistance Sitting-balance support: Feet supported Sitting balance-Leahy Scale: Good     Standing balance support: No upper extremity supported;During functional activity Standing balance-Leahy Scale: Fair Standing balance comment: completes standing functional task with no UE support                           ADL either performed or assessed with clinical judgement   ADL Overall ADL's : Needs assistance/impaired     Grooming: Wash/dry hands;Standing;Supervision/safety         Lower Body Bathing Details (indicate cue type and reason): education on using LH sponge for LB bathing Upper Body Dressing : Supervision/safety;Set up;Sitting;Moderate assistance Upper Body Dressing Details (indicate cue type and reason): MOD A to orient brace correctly as pt initially attempted to don upside down, supervision/ set-up to don shirt in sitting Lower Body Dressing: Moderate assistance;Sit to/from stand;Cueing for sequencing;Adhering to back precautions;With adaptive equipment;Min guard Lower Body Dressing Details (indicate cue type and reason): MOD A to don socks with sock aid, cues for sequencing of novel task. MOD A to thread pants through BLEs, close min guard for sit>stand to pull pants up to waist line Toilet Transfer: Min guard;Ambulation;RW;Regular Glass blower/designer Details (indicate cue type and reason): from EOB>bathroom with RW Toileting- Clothing Manipulation and Hygiene: Supervision/safety;Sit to/from stand;Cueing for safety Toileting - Clothing Manipulation Details (indicate cue type and reason):  cues for technique for posterior pericare, education on using AE if needed   Tub/Shower Transfer  Details (indicate cue type and reason): educated pt re: use of 3:1 as shower seat for increased safety and independence with task completion Functional mobility during ADLs: Min guard;Rolling walker General ADL Comments: session focus on functional mobility with RW, standing grooming, LB/UB ADL. Pt with good carryover of precautions and able funcitonally demo good technique     Vision Patient Visual Report: No change from baseline     Perception     Praxis      Cognition Arousal/Alertness: Awake/alert Behavior During Therapy: WFL for tasks assessed/performed Overall Cognitive Status: Within Functional Limits for tasks assessed                                          Exercises     Shoulder Instructions       General Comments educated on all LB AE, brace management and wear schedule.    Pertinent Vitals/ Pain       Pain Assessment: 0-10 Pain Score: 6  Pain Location: back, incisional Pain Descriptors / Indicators: Sore Pain Intervention(s): Monitored during session;Repositioned  Home Living                                          Prior Functioning/Environment              Frequency  Min 2X/week        Progress Toward Goals  OT Goals(current goals can now be found in the care plan section)  Progress towards OT goals: Progressing toward goals  Acute Rehab OT Goals Patient Stated Goal: go home with husband and dogs OT Goal Formulation: With patient Time For Goal Achievement: 08/24/19 Potential to Achieve Goals: Good  Plan Discharge plan remains appropriate    Co-evaluation                 AM-PAC OT "6 Clicks" Daily Activity     Outcome Measure   Help from another person eating meals?: None Help from another person taking care of personal grooming?: A Little Help from another person toileting, which includes using toliet, bedpan, or urinal?: A Little Help from another person bathing (including washing,  rinsing, drying)?: A Little Help from another person to put on and taking off regular upper body clothing?: None Help from another person to put on and taking off regular lower body clothing?: A Little 6 Click Score: 20    End of Session Equipment Utilized During Treatment: Rolling walker;Back brace  OT Visit Diagnosis: Other abnormalities of gait and mobility (R26.89);Pain;Unsteadiness on feet (R26.81)   Activity Tolerance Patient tolerated treatment well   Patient Left in bed;with call bell/phone within reach   Nurse Communication          Time: [id number] OT Time Calculation (min): 24 min  Charges: OT General Charges $OT Visit: 1 Visit OT Treatments $Self Care/Home Management : 23-37 mins  Lanier Clam., COTA/L Acute Rehabilitation Services [phone]    Ihor Gully 08/11/2019, 10:40 AM

## 2019-08-11 NOTE — Progress Notes (Signed)
Pt given discharge education and all questions answered. No printed prescriptions to give and equipment was delivered to Pt's room prior to discharge. IV's removed. Pt taken to car with all belongings.

## 2019-08-11 NOTE — Progress Notes (Signed)
Physical Therapy Treatment Patient Details Name: Caitlin [name] MRN: [id number] DOB: [birth year] Today's Date: 08/11/2019    History of Present Illness 73 yo female admitted to [facility] on 12/21 for urinary urgency and incotinence, increasing back and LE pain s/p PLIF L3-L4 L4-L5 on 08/04/19. Pt s/p evacuation of lumbar epidural hematoma on 12/21. PMH includes afib, HTN, COPD, depression, asthma, DM with neuropathy, OSA.    PT Comments    Pt with moderate to severe back pain this session, but mobilized well in hallways. Pt is able to state and demonstrate safety with back precautions, ambulate hallway distance, and complete stair training without difficulty today. Pt requires some assist for bed mobility at this point, but per pt her husband can provide min assist for this purpose. PT feels pt is safe to d/c from a mobility standpoint.    Follow Up Recommendations  No PT follow up;Supervision for mobility/OOB     Equipment Recommendations  Rolling walker with 5" wheels;3in1 (PT)    Recommendations for Other Services       Precautions / Restrictions Precautions Precautions: Fall Precaution Booklet Issued: Yes (comment) Precaution Comments: Handout present in room - pt able to state all back precautions and demonstrate appropriate mobility s/p surgery Required Braces or Orthoses: Spinal Brace Spinal Brace: Lumbar corset;Applied in sitting position Restrictions Weight Bearing Restrictions: No    Mobility  Bed Mobility Overal bed mobility: Needs Assistance Bed Mobility: Rolling;Sidelying to Sit;Sit to Sidelying Rolling: Min guard Sidelying to sit: Min assist     Sit to sidelying: Min assist General bed mobility comments: min guard for rolling for safety, reinforcing no use of bedrails as pt does not have them at home. Min assist for sidelying<>sit for LE and trunk light lift assist, pt's husband able to assist per pt report.  Transfers Overall transfer level: Needs  assistance Equipment used: None Transfers: Sit to/from Stand Sit to Stand: Supervision         General transfer comment: supervision for safety, pt with increased time to rise due to back pain.  Ambulation/Gait Ambulation/Gait assistance: Supervision Gait Distance (Feet): 190 Feet Assistive device: None Gait Pattern/deviations: Step-through pattern;Decreased stride length Gait velocity: decr   General Gait Details: supervision for safety, pt with short choppy steps upon initiating gait training but with improved step length and ease of gait with increased gait distance.   Stairs Stairs: Yes Stairs assistance: Supervision Stair Management: One rail Right;Forwards;Step to pattern Number of Stairs: 3 General stair comments: supervision for safety, verbal cuing for step-to stair navigation for safety.   Wheelchair Mobility    Modified Rankin (Stroke Patients Only)       Balance Overall balance assessment: Needs assistance Sitting-balance support: Feet supported Sitting balance-Leahy Scale: Good     Standing balance support: No upper extremity supported;During functional activity Standing balance-Leahy Scale: Fair Standing balance comment: ambulates without support, supervision for pt safety                            Cognition Arousal/Alertness: Awake/alert Behavior During Therapy: WFL for tasks assessed/performed Overall Cognitive Status: Within Functional Limits for tasks assessed                                        Exercises      General Comments        Pertinent Vitals/Pain Pain Assessment: 0-10  Pain Score: 7  Pain Location: back, incisional Pain Descriptors / Indicators: Sore Pain Intervention(s): Limited activity within patient's tolerance;Monitored during session;Repositioned;RN gave pain meds during session    Home Living                      Prior Function            PT Goals (current goals can now  be found in the care plan section) Acute Rehab PT Goals Patient Stated Goal: go home with husband and dogs PT Goal Formulation: With patient Time For Goal Achievement: 08/17/19 Potential to Achieve Goals: Good Progress towards PT goals: Progressing toward goals    Frequency    Min 3X/week      PT Plan Current plan remains appropriate    Co-evaluation              AM-PAC PT "6 Clicks" Mobility   Outcome Measure  Help needed turning from your back to your side while in a flat bed without using bedrails?: A Little Help needed moving from lying on your back to sitting on the side of a flat bed without using bedrails?: A Little Help needed moving to and from a bed to a chair (including a wheelchair)?: A Little Help needed standing up from a chair using your arms (e.g., wheelchair or bedside chair)?: None Help needed to walk in hospital room?: None Help needed climbing 3-5 steps with a railing? : A Little 6 Click Score: 20    End of Session Equipment Utilized During Treatment: Back brace Activity Tolerance: Patient tolerated treatment well(and nausea) Patient left: with call bell/phone within reach;in bed;with bed alarm set(pt verbally agrees to press call button and wait for assist prior to mobilizing back to bed.) Nurse Communication: Mobility status PT Visit Diagnosis: Pain;Other abnormalities of gait and mobility (R26.89) Pain - part of body: (back)     Time: [id number] PT Time Calculation (min) (ACUTE ONLY): 19 min  Charges:  $Gait Training: 8-22 mins                     Rashon Rezek E, PT [place] Pager [phone]  Office [phone]    Beva Remund D Cookie Pore 08/11/2019, 10:05 AM

## 2019-08-11 NOTE — Discharge Summary (Signed)
Physician Discharge Summary  Patient ID: Caitlin [name] MRN: [id number] DOB/AGE: [birth year] 73 y.o.  Admit date: 08/09/2019 Discharge date: 08/11/2019  Admission Diagnoses: Lumbar epidural hematoma, cauda equina syndrome  Discharge Diagnoses: As above Active Problems:   Epidural hematoma [facility])   Discharged Condition: good  Hospital Course: I performed an evacuation of lumbar epidural hematoma on 08/09/2019.  The surgery went well.  The patient's postoperative course was unremarkable.  After surgery the patient felt much better and her urinary retention resolved.  On postoperative day #2 she requested discharge home.  She was given written and oral discharge instructions.  All her questions were answered.  Consults: Physical therapy, Occupational Therapy Significant Diagnostic Studies: Lumbar MRI Treatments: Exploration of lumbar wound, evacuation of lumbar epidural hematoma Discharge Exam: Blood pressure (!) 162/84, pulse 74, temperature (!) 97.5 F (36.4 C), resp. rate 20, height 5\' 3"  (1.6 m), weight 89.4 kg, SpO2 96 %. The patient is alert and pleasant.  Her lower extremity strength is normal.  She looks well.  Disposition: Home  Discharge Instructions    Call MD for:  difficulty breathing, headache or visual disturbances   Complete by: As directed    Call MD for:  extreme fatigue   Complete by: As directed    Call MD for:  hives   Complete by: As directed    Call MD for:  persistant dizziness or light-headedness   Complete by: As directed    Call MD for:  persistant nausea and vomiting   Complete by: As directed    Call MD for:  redness, tenderness, or signs of infection (pain, swelling, redness, odor or green/yellow discharge around incision site)   Complete by: As directed    Call MD for:  severe uncontrolled pain   Complete by: As directed    Call MD for:  temperature >100.4   Complete by: As directed    Diet - low sodium heart healthy   Complete by: As directed    Discharge instructions   Complete by: As directed    Call [phone] for a followup appointment. Take a stool softener while you are using pain medications.   Driving Restrictions   Complete by: As directed    Do not drive for 2 weeks.   Increase activity slowly   Complete by: As directed    Lifting restrictions   Complete by: As directed    Do not lift more than 5 pounds. No excessive bending or twisting.   May shower / Bathe   Complete by: As directed    Remove the dressing for 3 days after surgery.  You may shower, but leave the incision alone.   Remove dressing in 24 hours   Complete by: As directed      Allergies as of 08/11/2019      Reactions   Codeine Nausea Only      Medication List    TAKE these medications   albuterol (2.5 MG/3ML) 0.083% nebulizer solution Commonly known as: PROVENTIL Take 2.5 mg by nebulization every 6 (six) hours as needed for wheezing or shortness of breath.   bisacodyl 10 MG suppository Commonly known as: DULCOLAX Place 1 suppository (10 mg total) rectally daily as needed for moderate constipation.   diltiazem 300 MG 24 hr capsule Commonly known as: TIAZAC Take 300 mg by mouth every evening.   DULoxetine 60 MG capsule Commonly known as: CYMBALTA Take 60 mg by mouth every evening.   fexofenadine 180 MG tablet Commonly known as: ALLEGRA  Take 180 mg by mouth daily as needed for allergies or rhinitis.   Fifty50 Pen Needles 31G X 8 MM Misc Generic drug: Insulin Pen Needle Inject 1 Units into the skin daily.   lisinopril 20 MG tablet Commonly known as: ZESTRIL Take 20 mg by mouth daily.   Lutein 20 Caps Take 20 mg by mouth every evening.   omeprazole 40 MG capsule Commonly known as: PRILOSEC Take 40 mg by mouth 2 (two) times daily.   ondansetron 4 MG tablet Commonly known as: ZOFRAN Take 4 mg by mouth every 8 (eight) hours as needed for nausea or vomiting.   oxyCODONE 5 MG immediate release tablet Commonly known as: Oxy  IR/ROXICODONE Take 1 tablet (5 mg total) by mouth every 4 (four) hours as needed for moderate pain ((score 4 to 6)).   PATADAY OP Place 1 drop into both eyes daily as needed (itching/irritation).   Potassium Gluconate 550 (90 K) MG Tabs Take 550 mg by mouth daily as needed (cramps).   rivaroxaban 20 MG Tabs tablet Commonly known as: XARELTO Take 20 mg by mouth daily with supper.   rOPINIRole 2 MG tablet Commonly known as: REQUIP Take 2 mg by mouth 2 (two) times daily as needed (restless legs).   Semaglutide(0.25 or 0.5MG /DOS) 2 MG/1.5ML Sopn Inject 0.25 mg into the skin every Monday.   SYSTANE ULTRA OP Place 1 drop into both eyes 4 (four) times daily.   tiZANidine 2 MG tablet Commonly known as: ZANAFLEX Take 1 tablet (2 mg total) by mouth every 6 (six) hours as needed. What changed: reasons to take this   Tresiba FlexTouch 200 UNIT/ML Sopn Generic drug: Insulin Degludec Inject 74 Units into the skin every evening.   Tums 500 MG chewable tablet Generic drug: calcium carbonate Chew 2 tablets by mouth daily as needed for indigestion.   VITAMIN D-VITAMIN K PO Take 1 tablet by mouth every evening.        Signed: Ophelia Charter 08/11/2019, 8:20 AM

## 2019-10-13 DIAGNOSIS — R000 Tachycardia, unspecified: Secondary | ICD-10-CM | POA: Insufficient documentation

## 2019-10-17 ENCOUNTER — Other Ambulatory Visit: Payer: Self-pay | Admitting: Family Medicine

## 2019-10-20 ENCOUNTER — Encounter: Payer: Self-pay | Admitting: Family Medicine

## 2019-11-08 DIAGNOSIS — D509 Iron deficiency anemia, unspecified: Secondary | ICD-10-CM | POA: Insufficient documentation

## 2019-11-08 DIAGNOSIS — M79604 Pain in right leg: Secondary | ICD-10-CM | POA: Insufficient documentation

## 2019-12-11 ENCOUNTER — Other Ambulatory Visit: Payer: Self-pay | Admitting: Family Medicine

## 2019-12-11 DIAGNOSIS — G2581 Restless legs syndrome: Secondary | ICD-10-CM

## 2019-12-13 DIAGNOSIS — G834 Cauda equina syndrome: Secondary | ICD-10-CM

## 2019-12-13 DIAGNOSIS — E1140 Type 2 diabetes mellitus with diabetic neuropathy, unspecified: Secondary | ICD-10-CM | POA: Insufficient documentation

## 2019-12-13 DIAGNOSIS — Z794 Long term (current) use of insulin: Secondary | ICD-10-CM | POA: Insufficient documentation

## 2019-12-13 DIAGNOSIS — I4891 Unspecified atrial fibrillation: Secondary | ICD-10-CM | POA: Insufficient documentation

## 2019-12-13 DIAGNOSIS — T8859XA Other complications of anesthesia, initial encounter: Secondary | ICD-10-CM | POA: Insufficient documentation

## 2019-12-13 DIAGNOSIS — J449 Chronic obstructive pulmonary disease, unspecified: Secondary | ICD-10-CM | POA: Insufficient documentation

## 2019-12-13 DIAGNOSIS — E66812 Obesity, class 2: Secondary | ICD-10-CM | POA: Insufficient documentation

## 2019-12-13 DIAGNOSIS — I110 Hypertensive heart disease with heart failure: Secondary | ICD-10-CM | POA: Insufficient documentation

## 2019-12-13 HISTORY — DX: Morbid (severe) obesity due to excess calories: E66.01

## 2019-12-13 HISTORY — DX: Hypertensive heart disease with heart failure: I11.0

## 2019-12-13 HISTORY — DX: Cauda equina syndrome: G83.4

## 2019-12-13 HISTORY — DX: Type 2 diabetes mellitus with diabetic neuropathy, unspecified: E11.40

## 2019-12-26 ENCOUNTER — Other Ambulatory Visit: Payer: Self-pay | Admitting: Family Medicine

## 2020-01-21 ENCOUNTER — Other Ambulatory Visit: Payer: Self-pay | Admitting: Family Medicine

## 2020-01-21 DIAGNOSIS — G2581 Restless legs syndrome: Secondary | ICD-10-CM

## 2020-03-08 LAB — HEMOGLOBIN A1C: Hemoglobin A1C: 6.5

## 2020-04-01 ENCOUNTER — Other Ambulatory Visit: Payer: Self-pay | Admitting: Family Medicine

## 2020-04-01 DIAGNOSIS — G2581 Restless legs syndrome: Secondary | ICD-10-CM

## 2020-04-05 ENCOUNTER — Ambulatory Visit (INDEPENDENT_AMBULATORY_CARE_PROVIDER_SITE_OTHER): Payer: Medicare Other | Admitting: Family Medicine

## 2020-04-05 ENCOUNTER — Ambulatory Visit: Payer: Medicare Other

## 2020-04-05 ENCOUNTER — Encounter: Payer: Self-pay | Admitting: Family Medicine

## 2020-04-05 ENCOUNTER — Other Ambulatory Visit: Payer: Self-pay

## 2020-04-05 VITALS — BP 180/90 | HR 96 | Temp 97.4°F | Resp 17 | Ht 63.0 in | Wt 185.8 lb

## 2020-04-05 DIAGNOSIS — Z0000 Encounter for general adult medical examination without abnormal findings: Secondary | ICD-10-CM | POA: Diagnosis not present

## 2020-04-05 DIAGNOSIS — J3489 Other specified disorders of nose and nasal sinuses: Secondary | ICD-10-CM

## 2020-04-05 MED ORDER — LISINOPRIL 40 MG PO TABS: 40.0000 mg | ORAL_TABLET | Freq: Every day | ORAL | 1 refills | Status: DC

## 2020-04-05 NOTE — Progress Notes (Addendum)
Subjective:    Caitlin [name] is a 74 y.o. female who presents for Medicare Annual/Subsequent preventive examination.  Preventive Screening-Counseling & Management  Tobacco Social History   Tobacco Use  Smoking Status Never Smoker  Smokeless Tobacco Never Used    Health Maintenance:  Mammogram-3/321-benign, colonoscopy 10/15/18-diverticula, hemorrhoids, DEXA 08/25/18-normal, Td-2010  Problems Prior to Visit 1. Lesion -nose-hyperpigmented-needs referral due to CPAP not fitting correctly 2. Elevated bp-elevated at home on current medication of lisinopril 20mg -takes daily-pt took today Current Problems (verified) Patient Active Problem List   Diagnosis Date Noted  . Morbid obesity ([place]) 12/13/2019  . Atrial fibrillation ([place]) 12/13/2019  . Diabetic neuropathy, type II diabetes mellitus ([place]) 12/13/2019  . 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[place]) 12/13/2019  . COPD (chronic obstructive pulmonary disease) ([place]) 12/13/2019  . Cauda equina syndrome ([place]) 12/13/2019  . Epidural hematoma ([place]) 08/09/2019  . S/P lumbar fusion 08/04/2019    Medications Prior to Visit Current Outpatient Medications on File Prior to Visit  Medication Sig Dispense Refill  . albuterol (PROVENTIL) (2.5 MG/3ML) 0.083% nebulizer solution Take 2.5 mg by nebulization every 6 (six) hours as needed for wheezing or shortness of breath.     . busPIRone (BUSPAR) 10 MG tablet TAKE 1 TABLET BY MOUTH TWICE A DAY 60 tablet 2  . calcium carbonate (TUMS) 500 MG chewable tablet Chew 2 tablets by mouth daily as needed for indigestion.     Marland Kitchen diltiazem (CARDIZEM CD) 120 MG 24 hr capsule Take by mouth daily.    . DULoxetine (CYMBALTA) 60 MG capsule Take 60 mg by mouth every evening.     . fexofenadine (ALLEGRA) 180 MG tablet Take 180 mg by mouth daily as needed for allergies or rhinitis.    Marland Kitchen gabapentin (NEURONTIN) 400 MG capsule Take 400 mg by mouth daily.    . Insulin Degludec (TRESIBA FLEXTOUCH) 200 UNIT/ML  SOPN Inject 74 Units into the skin every evening.    . Insulin Pen Needle (FIFTY50 PEN NEEDLES) 31G X 8 MM MISC Inject 1 Units into the skin daily.    Marland Kitchen lisinopril (ZESTRIL) 20 MG tablet Take 20 mg by mouth daily.    . methylPREDNISolone (MEDROL DOSEPAK) 4 MG TBPK tablet Take by mouth as directed.    . Misc Natural Products (LUTEIN 20) CAPS Take 20 mg by mouth every evening.    Marland Kitchen omeprazole (PRILOSEC) 40 MG capsule TAKE 1 CAPSULE(S) BY MOUTH DAILY IN THE MORNING 90 capsule 1  . Potassium Gluconate 550 (90 K) MG TABS Take 550 mg by mouth daily as needed (cramps).    . rivaroxaban (XARELTO) 20 MG TABS tablet Take 20 mg by mouth daily with supper.    Marland Kitchen rOPINIRole (REQUIP) 3 MG tablet TAKE 1 TABLET BY MOUTH TWICE A DAY 180 tablet 0  . Semaglutide,0.25 or 0.5MG /DOS, 2 MG/1.5ML SOPN Inject 0.5 mg into the skin every Monday.     Marland Kitchen VITAMIN D-VITAMIN K PO Take 1 tablet by mouth every evening.     No current facility-administered medications on file prior to visit.    Current Medications (verified) Current Outpatient Medications  Medication Sig Dispense Refill  . albuterol (PROVENTIL) (2.5 MG/3ML) 0.083% nebulizer solution Take 2.5 mg by nebulization every 6 (six) hours as needed for wheezing or shortness of breath.     . busPIRone (BUSPAR) 10 MG tablet TAKE 1 TABLET BY MOUTH TWICE A DAY 60 tablet 2  . calcium carbonate (TUMS) 500 MG chewable tablet Chew 2 tablets by mouth daily  as needed for indigestion.     Marland Kitchen diltiazem (CARDIZEM CD) 120 MG 24 hr capsule Take by mouth daily.    . DULoxetine (CYMBALTA) 60 MG capsule Take 60 mg by mouth every evening.     . fexofenadine (ALLEGRA) 180 MG tablet Take 180 mg by mouth daily as needed for allergies or rhinitis.    Marland Kitchen gabapentin (NEURONTIN) 400 MG capsule Take 400 mg by mouth daily.    . Insulin Degludec (TRESIBA FLEXTOUCH) 200 UNIT/ML SOPN Inject 74 Units into the skin every evening.    . Insulin Pen Needle (FIFTY50 PEN NEEDLES) 31G X 8 MM MISC Inject 1  Units into the skin daily.    Marland Kitchen lisinopril (ZESTRIL) 20 MG tablet Take 20 mg by mouth daily.    . methylPREDNISolone (MEDROL DOSEPAK) 4 MG TBPK tablet Take by mouth as directed.    . Misc Natural Products (LUTEIN 20) CAPS Take 20 mg by mouth every evening.    Marland Kitchen omeprazole (PRILOSEC) 40 MG capsule TAKE 1 CAPSULE(S) BY MOUTH DAILY IN THE MORNING 90 capsule 1  . Potassium Gluconate 550 (90 K) MG TABS Take 550 mg by mouth daily as needed (cramps).    . rivaroxaban (XARELTO) 20 MG TABS tablet Take 20 mg by mouth daily with supper.    Marland Kitchen rOPINIRole (REQUIP) 3 MG tablet TAKE 1 TABLET BY MOUTH TWICE A DAY 180 tablet 0  . Semaglutide,0.25 or 0.5MG /DOS, 2 MG/1.5ML SOPN Inject 0.5 mg into the skin every Monday.     Marland Kitchen VITAMIN D-VITAMIN K PO Take 1 tablet by mouth every evening.     No current facility-administered medications for this visit.     Allergies (verified) Codeine   PAST HISTORY Hyperlipidemia RLS OSA AF Back pain-DDD Hypertensive CHF Hypothyroid DM Family History Family History  Problem Relation Age of Onset  . Colon cancer Paternal Aunt 56    Social History Social History   Tobacco Use  . Smoking status: Never Smoker  . Smokeless tobacco: Never Used  Substance Use Topics  . Alcohol use: Not Currently     Are there smokers in your home (other than you)? 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 no  Have you lost interest or pleasure in daily life? Yes-due to back surgery  Do you often feel hopeless?no  Activities of Daily Living In your present state of health, do you  have any difficulty performing the following activities?:  Driving? no Managing money?  no Feeding yourself? no Getting from bed to chair? no Climbing a flight of stairs? no Preparing food and eating?:no Bathing or showering? no Getting dressed: no Getting to the toilet? no Using the toilet:no Moving around from place to place: no In the past year have you fallen or had a near fall?:fall last week-use walker prn  Are you sexually active?  yes  Do you have more than one partner? no  Hearing Difficulties: Do you often ask people to speak up or repeat themselves?no Do you experience ringing or noises in your ears? No Do you have difficulty understanding soft or whispered voices?no  Do you  feel that you have a problem with memory? yes  Do you often misplace items?no  Do you feel safe at home?  yes  Cognitive Testing  Alert? yes Normal Appearance?yes  Oriented to person? yes Place? yes  Time? yes  Recall of three objects?  yes  Can perform simple calculations? yes  Displays appropriate judgment? yes  Can read the correct time from a watch face?yes   Advanced Directives have been discussed with the patient? NO  List the Names of Other Physician/Practitioners you currently use: 1.  Pulmonary Dr. Dennison Mascot 2. Cardiac-Dr. Ola Spurr 3. Spine surgery-Dr Ronnald Ramp 4. Endocrinology-Dr. Meredith Pel 5. [facility]  Immunization History  Administered Date(s) Administered  . Influenza-Unspecified 03/19/2014, 05/20/2015    Screening Tests   Objective:    Eye exam -[facility]  Body mass index is 32.91 kg/m. BP (!) 180/90 (BP Location: Right Arm, Patient Position: Sitting)   Pulse 96   Temp (!) 97.4 F (36.3 C) (Temporal)   Resp 17   Ht 5\' 3"  (1.6 m)   Wt 185 lb 12.8 oz (84.3 kg)   SpO2 97%   BMI 32.91 kg/m  Recheck blood pressure right arm manual 162/98  CV-RRR EXT-no edema Pt gait abnormal with limp-no walker being used to assist Assessment:  1. Lesion of  nose Concern for difficulty with CPAP mask - Ambulatory referral to Dermatology  2. Encounter for annual wellness visit (AWV) in Medicare patient Use bike to exercise Check blood pressure-elevated today-increase in lisinopril-follow appt to discuss and recheck    Plan:     During the course of the visit the patient was educated and counseled about appropriate screening and preventive services including:  Patient Instructions (the written plan) was given to the patient.yes Medicare Attestation I have personally reviewed: The patient's medical and social history Their use of alcohol, tobacco or illicit drugs Their current medications and supplements The patient's functional ability including ADLs,fall risks, home safety risks, cognitive, and hearing and visual impairment Diet and physical activities Evidence for depression or mood disorders  The patient's weight, height, BMI, and visual acuity have been recorded in the chart.  I have made referrals, counseling, and provided education to the patient based on review of the above and I have provided the patient with a written personalized care plan for preventive services.     Mertha Baars, MD   04/05/2020

## 2020-04-05 NOTE — Patient Instructions (Addendum)
Start riding stationary bike [facility]at[facility] referral Increase lisinopril to 40mg -it is ok to take 2-20mg  until they run out then start 40mg  Follow up in 3-4 weeks to recheck blood pressure Write down blood pressure readings and bring to appointment

## 2020-04-06 DIAGNOSIS — Z0000 Encounter for general adult medical examination without abnormal findings: Secondary | ICD-10-CM | POA: Insufficient documentation

## 2020-04-06 DIAGNOSIS — J3489 Other specified disorders of nose and nasal sinuses: Secondary | ICD-10-CM | POA: Insufficient documentation

## 2020-05-03 ENCOUNTER — Telehealth (INDEPENDENT_AMBULATORY_CARE_PROVIDER_SITE_OTHER): Payer: Medicare Other | Admitting: Family Medicine

## 2020-05-03 ENCOUNTER — Encounter: Payer: Self-pay | Admitting: Family Medicine

## 2020-05-03 DIAGNOSIS — I10 Essential (primary) hypertension: Secondary | ICD-10-CM | POA: Diagnosis not present

## 2020-05-03 DIAGNOSIS — J060 Acute laryngopharyngitis: Secondary | ICD-10-CM

## 2020-05-03 DIAGNOSIS — R509 Fever, unspecified: Secondary | ICD-10-CM | POA: Insufficient documentation

## 2020-05-03 DIAGNOSIS — E1159 Type 2 diabetes mellitus with other circulatory complications: Secondary | ICD-10-CM | POA: Insufficient documentation

## 2020-05-03 DIAGNOSIS — I152 Hypertension secondary to endocrine disorders: Secondary | ICD-10-CM | POA: Insufficient documentation

## 2020-05-03 MED ORDER — DILTIAZEM HCL ER COATED BEADS 240 MG PO CP24: 240.0000 mg | ORAL_CAPSULE | Freq: Every day | ORAL | 0 refills | Status: DC

## 2020-05-03 NOTE — Progress Notes (Signed)
   Acute Office Visit  Subjective:  Virtual Visit via Telephone Note  I connected with Caitlin [name] on 05/03/20 at  8:30 AM EDT by telephone and verified that I am speaking with the correct person using two identifiers.  Location: Patient: home Provider: [place]   I discussed the limitations, risks, security and privacy concerns of performing an evaluation and management service by telephone and the availability of in person appointments. I also discussed with the patient that there may be a patient responsible charge related to this service. The patient expressed understanding and agreed to proceed.    History of Present Illness: Fever this morning-increase to 99(normal 97) Sore throat-gargled with salt water  No n/v/d Laryngitis Cough-non -productive, no SOB-no allergies No congestion No rash Headache this morning No fatigue or body aches Had COVID vaccine-9 months ago Observations/Objective: bp 142-160's on home bp cuff Reviewed labwork 7/21-endocrinologist Assessment and Plan: 1. Sore throat and laryngitis Improved with gargling salt water 2. Fever, unspecified fever cause tylenol 3. Essential hypertension Increase diltiazem to 240mg  Pt taking lisinopril 40mg   Follow Up Instructions: take bp -will call tomorrow    I discussed the assessment and treatment plan with the patient. The patient was provided an opportunity to ask questions and all were answered. The patient agreed with the plan and demonstrated an understanding of the instructions.   The patient was advised to call back or seek an in-person evaluation if the symptoms worsen or if the condition fails to improve as anticipated.  I provided 12 minutes of non-face-to-face time during this encounter.   Garrick Midgley Hannah Beat, MD

## 2020-06-01 ENCOUNTER — Other Ambulatory Visit: Payer: Self-pay | Admitting: Family Medicine

## 2020-06-01 DIAGNOSIS — I10 Essential (primary) hypertension: Secondary | ICD-10-CM

## 2020-06-19 ENCOUNTER — Encounter: Payer: Self-pay | Admitting: Family Medicine

## 2020-06-19 ENCOUNTER — Other Ambulatory Visit: Payer: Self-pay

## 2020-06-19 ENCOUNTER — Ambulatory Visit (INDEPENDENT_AMBULATORY_CARE_PROVIDER_SITE_OTHER): Payer: Medicare Other | Admitting: Family Medicine

## 2020-06-19 VITALS — BP 144/92 | HR 92 | Temp 97.6°F | Ht 63.0 in | Wt 190.6 lb

## 2020-06-19 DIAGNOSIS — K219 Gastro-esophageal reflux disease without esophagitis: Secondary | ICD-10-CM

## 2020-06-19 DIAGNOSIS — E1169 Type 2 diabetes mellitus with other specified complication: Secondary | ICD-10-CM

## 2020-06-19 DIAGNOSIS — Z23 Encounter for immunization: Secondary | ICD-10-CM | POA: Diagnosis not present

## 2020-06-19 DIAGNOSIS — I4891 Unspecified atrial fibrillation: Secondary | ICD-10-CM

## 2020-06-19 DIAGNOSIS — J449 Chronic obstructive pulmonary disease, unspecified: Secondary | ICD-10-CM

## 2020-06-19 DIAGNOSIS — E1140 Type 2 diabetes mellitus with diabetic neuropathy, unspecified: Secondary | ICD-10-CM

## 2020-06-19 DIAGNOSIS — I10 Essential (primary) hypertension: Secondary | ICD-10-CM

## 2020-06-19 DIAGNOSIS — F419 Anxiety disorder, unspecified: Secondary | ICD-10-CM

## 2020-06-19 DIAGNOSIS — M79604 Pain in right leg: Secondary | ICD-10-CM

## 2020-06-19 DIAGNOSIS — E559 Vitamin D deficiency, unspecified: Secondary | ICD-10-CM | POA: Diagnosis not present

## 2020-06-19 DIAGNOSIS — J3489 Other specified disorders of nose and nasal sinuses: Secondary | ICD-10-CM

## 2020-06-19 DIAGNOSIS — E1165 Type 2 diabetes mellitus with hyperglycemia: Secondary | ICD-10-CM

## 2020-06-19 DIAGNOSIS — E785 Hyperlipidemia, unspecified: Secondary | ICD-10-CM

## 2020-06-19 DIAGNOSIS — M79605 Pain in left leg: Secondary | ICD-10-CM

## 2020-06-19 DIAGNOSIS — E119 Type 2 diabetes mellitus without complications: Secondary | ICD-10-CM

## 2020-06-19 DIAGNOSIS — Z794 Long term (current) use of insulin: Secondary | ICD-10-CM

## 2020-06-19 MED ORDER — DILTIAZEM HCL ER COATED BEADS 300 MG PO CP24: 300.0000 mg | ORAL_CAPSULE | Freq: Every day | ORAL | 0 refills | Status: DC

## 2020-06-19 MED ORDER — GABAPENTIN 600 MG PO TABS: 600.0000 mg | ORAL_TABLET | Freq: Every day | ORAL | 0 refills | Status: DC

## 2020-06-19 NOTE — Patient Instructions (Addendum)
Increase gabapentin to 600mg  daily-take at night.  Increase diltiazem to 300mg  daily\ Schedule with cardiology for yearly appointment

## 2020-06-19 NOTE — Progress Notes (Signed)
Established Patient Office Visit  Subjective:  Patient ID: Caitlin [name], female    DOB: [birth year]  Age: 74 y.o. MRN: [id number]  CC:  Chief Complaint  Patient presents with  . Hypertension    18M follow up  . Diabetes    HPI Caitlin [name] presents for HTN-dilitiazem 265m/lisinopril 489m Skin lesion -removed by dermatology 3 weeks-results showed pre-CA. Mohs surgery  LE pain-leg pain-achy-spasms-in feet Hand pain with arthritis-unable to take NSAIDs due to blood thinners  DM-sees endo yearly-wants labwork, taking Tresiba 100units daily + Semagltide .5. No oral meds  RLS/LE arthritis-worse with sitting, no pain with standing or walking-[place]/gabapentin 40096mVit D def-does not remember dose of Vit D  COPD-no increase use of inhalers  Past Medical History:  Diagnosis Date  . Arthritis   . Asthma   . Atrial fibrillation ([place] . Cauda equina syndrome ([facility]CC[place]/26/2021  . Complication of anesthesia   . COPD (chronic obstructive pulmonary disease) ([place] . Depression   . Diabetes mellitus without complication ([place]  Type II  . Diabetic neuropathy, type II diabetes mellitus ([place]/26/2021  . Dysrhythmia   . GERD (gastroesophageal reflux disease)   . HTN (hypertension)   . Hypercholesteremia   . 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/26/2021  . Morbid obesity ([place]/26/2021  . Obstructive sleep apnea   . PONV (postoperative nausea and vomiting)    "not the last few times"  . RLS (restless legs syndrome)   . Sleep apnea     Past Surgical History:  Procedure Laterality Date  . ATRIAL FIBRILLATION ABLATION     x3  . COLONOSCOPY  01/04/2013   Moderate predominantly sigmoid diverticulosis. Small internal hemorrhoids. Otherwise normal colonosopy.   . LUMBAR WOUND DEBRIDEMENT N/A 08/09/2019   Procedure: LUMBAR WOUND DEBRIDEMENT Evacuation of  EPIDURAL HEMATOMA;  Surgeon: JenNewman PiesD;  [facility];   Laterality: N/A;    Family History  Problem Relation Age of Onset  . Colon cancer Paternal AunAunt 21 Social History   Socioeconomic History  . Marital status: Married    Spouse name: Not on file  . Number of children: Not on file  . Years of education: Not on file  . Highest education level: Not on file  Occupational History  . Not on file  Tobacco Use  . Smoking status: Never Smoker  . Smokeless tobacco: Never Used  Vaping Use  . Vaping Use: Never used  Substance and Sexual Activity  . Alcohol use: Not Currently  . Drug use: Never  . Sexual activity: Not Currently  Other Topics Concern  . Not on file  Social History Narrative  . Not on file   Social Determinants of Health   Financial Resource Strain:   . Difficulty of Paying Living Expenses: Not on file  Food Insecurity:   . Worried About RunCharity fundraiser the Last Year: Not on file  . Ran Out of Food in the Last Year: Not on file  Transportation Needs:   . Lack of Transportation (Medical): Not on file  . Lack of Transportation (Non-Medical): Not on file  Physical Activity:   . Days of Exercise per Week: Not on file  . Minutes of Exercise per Session: Not on file  Stress:   . Feeling of Stress : Not on file  Social Connections:   . Frequency of Communication with Friends and Family: Not  on file  . Frequency of Social Gatherings with Friends and Family: Not on file  . Attends Religious Services: Not on file  . Active Member of Clubs or Organizations: Not on file  . Attends Archivist Meetings: Not on file  . Marital Status: Not on file  Intimate Partner Violence:   . Fear of Current or Ex-Partner: Not on file  . Emotionally Abused: Not on file  . Physically Abused: Not on file  . Sexually Abused: Not on file    Outpatient Medications Prior to Visit  Medication Sig Dispense Refill  . albuterol (PROVENTIL) (2.5 MG/3ML) 0.083% nebulizer solution Take 2.5 mg by nebulization every 6 (six)  hours as needed for wheezing or shortness of breath.     . busPIRone (BUSPAR) 10 MG tablet TAKE 1 TABLET BY MOUTH TWICE A DAY 60 tablet 2  . calcium carbonate (TUMS) 500 MG chewable tablet Chew 2 tablets by mouth daily as needed for indigestion.     Marland Kitchen diltiazem (DILTIAZEM CD) 240 MG 24 hr capsule Take 1 capsule (240 mg total) by mouth daily. 30 capsule 0  . DULoxetine (CYMBALTA) 60 MG capsule Take 60 mg by mouth every evening.     . fexofenadine (ALLEGRA) 180 MG tablet Take 180 mg by mouth daily as needed for allergies or rhinitis.    Marland Kitchen gabapentin (NEURONTIN) 400 MG capsule Take 400 mg by mouth daily.    . Insulin Degludec (TRESIBA FLEXTOUCH) 200 UNIT/ML SOPN Inject 70 Units into the skin every evening.     . Insulin Pen Needle (FIFTY50 PEN NEEDLES) 31G X 8 MM MISC Inject 1 Units into the skin daily.    Marland Kitchen lisinopril (ZESTRIL) 40 MG tablet Take 1 tablet (40 mg total) by mouth daily. 90 tablet 1  . Misc Natural Products (LUTEIN 20) CAPS Take 20 mg by mouth every evening.    Marland Kitchen omeprazole (PRILOSEC) 40 MG capsule TAKE 1 CAPSULE(S) BY MOUTH DAILY IN THE MORNING 90 capsule 1  . Potassium Gluconate 550 (90 K) MG TABS Take 550 mg by mouth daily as needed (cramps).    . rivaroxaban (XARELTO) 20 MG TABS tablet Take 20 mg by mouth daily with supper.    Marland Kitchen rOPINIRole (REQUIP) 3 MG tablet TAKE 1 TABLET BY MOUTH TWICE A DAY 180 tablet 0  . Semaglutide,0.25 or 0.5MG /DOS, 2 MG/1.5ML SOPN Inject 0.5 mg into the skin every Monday.     Marland Kitchen VITAMIN D-VITAMIN K PO Take 1 tablet by mouth every evening.    Marland Kitchen lisinopril (ZESTRIL) 20 MG tablet Take 20 mg by mouth daily.    . methylPREDNISolone (MEDROL DOSEPAK) 4 MG TBPK tablet Take by mouth as directed.    . traMADol (ULTRAM) 50 MG tablet SMARTSIG:1 By Mouth 4-5 Times Daily     No facility-administered medications prior to visit.    Allergies  Allergen Reactions  . Codeine Nausea Only    ROS Review of Systems  Constitutional: Negative for fatigue and fever.    Respiratory:       COPD  Cardiovascular: Negative.   Gastrointestinal: Negative.   Endocrine: Negative.   Genitourinary: Negative.   Musculoskeletal: Positive for arthralgias and myalgias.  Allergic/Immunologic: Negative.   Neurological: Negative for headaches.  Psychiatric/Behavioral: Positive for agitation.       Anxiety-better with medications-buspar prn cymbalta daily      Objective:    Physical Exam Constitutional:      Appearance: Normal appearance.  Cardiovascular:     Rate and  Rhythm: Normal rate and regular rhythm.     Pulses: Normal pulses.     Heart sounds: Normal heart sounds.  Pulmonary:     Effort: Pulmonary effort is normal.     Breath sounds: Normal breath sounds.  Musculoskeletal:     Cervical back: Normal range of motion and neck supple.  Neurological:     Mental Status: She is alert and oriented to person, place, and time.  Psychiatric:        Behavior: Behavior normal.     BP (!) 144/92 (BP Location: Left Arm, Patient Position: Sitting, Cuff Size: Normal)   Pulse 92   Temp 97.6 F (36.4 C) (Temporal)   Ht _0  (1.6 m)   Wt 190 lb 9.6 oz (86.5 kg)   SpO2 99%   BMI 33.76 kg/m  Wt Readings from Last 3 Encounters:  06/19/20 190 lb 9.6 oz (86.5 kg)  04/05/20 185 lb 12.8 oz (84.3 kg)  08/09/19 197 lb (89.4 kg)     Health Maintenance Due  Topic Date Due  . Hepatitis C Screening  Never done  . FOOT EXAM  Never done  . COVID-19 Vaccine (1) Never done  . TETANUS/TDAP  02/28/2019  . INFLUENZA VACCINE  03/19/2020    Lab Results  Component Value Date   WBC 10.0 08/10/2019   HGB 9.4 (L) 08/10/2019   HCT 28.4 (L) 08/10/2019   MCV 87.4 08/10/2019   PLT 315 08/10/2019   Lab Results  Component Value Date   NA 139 08/10/2019   K 4.0 08/10/2019   CO2 26 08/10/2019   GLUCOSE 120 (H) 08/10/2019   BUN 16 08/10/2019   CREATININE 0.86 08/10/2019   BILITOT 0.6 08/09/2019   ALKPHOS 60 08/09/2019   AST 16 08/09/2019   ALT 14 08/09/2019    PROT 6.0 (L) 08/09/2019   ALBUMIN 3.1 (L) 08/09/2019   CALCIUM 9.3 08/10/2019   ANIONGAP 14 08/10/2019   Lab Results  Component Value Date   HGBA1C 6.5 03/08/2020      Assessment & Plan:  1. Essential hypertension  diltiazem-increase dose to 361m-rx, continue lisinopril 468mdaily-recheck in 1 month - CMP14+EGFR - Lipid Panel With LDL/HDL Ratio - POCT URINALYSIS DIP (CLINITEK) - POCT UA - Microalbumin  2. Diabetes mellitus without complication ([facility] - HgB A1c - POCT URINALYSIS DIP (CLINITEK) - POCT UA - Microalbumin  3. Gastroesophageal reflux disease without esophagitis - CBC with Differential Omeprazole daily 4. Chronic obstructive pulmonary disease, unspecified COPD type (HCC) [place]  Albuterol prn-no recent increase in usage  5. Type 2 diabetes mellitus with hyperglycemia, with long-term current use of insulin (HCC) endo  6. Pain in both lower extremities Increase neurontin-60080mt night-risk/benefit/side effects d/w pt - Magnesium - Vitamin D (25 hydroxy) Taking K daily Tylenol 650m26mc-TID Unable to take NSAIDs due Xarelto cymbalta 60mg47mkely neuropathy worsening  7. Vitamin D deficiency Vit d OTC  Could be source of leg pain 8. Type 2 diabetes mellitus with diabetic neuropathy, with long-term current use of insulin (HCC) Suggested split on Tresiba since pt taking 100units daily-trial of 50units am and 50units pm semaglutide .5  9. Lesion of nose Removed by dermatology Avoid mask rubbing on area 10. Hyperlipidemia associated with type 2 diabetes mellitus (HCC) No medications-repeat lipid panel  11. Atrial fibrillation, unspecified type ([facility]will [facility] for appointment 12. Anxiety cymbalta 60mg 46mspar as neede Follow-up:  1 month-HTN   Caitlin [name]

## 2020-06-20 ENCOUNTER — Other Ambulatory Visit: Payer: Self-pay | Admitting: Family Medicine

## 2020-06-20 ENCOUNTER — Other Ambulatory Visit: Payer: Self-pay

## 2020-06-20 DIAGNOSIS — E876 Hypokalemia: Secondary | ICD-10-CM

## 2020-06-20 DIAGNOSIS — E875 Hyperkalemia: Secondary | ICD-10-CM

## 2020-06-20 LAB — CBC WITH DIFFERENTIAL/PLATELET
Basophils Absolute: 0.1 10*3/uL (ref 0.0–0.2)
Basos: 1 %
EOS (ABSOLUTE): 0.2 10*3/uL (ref 0.0–0.4)
Eos: 3 %
Hematocrit: 39.1 % (ref 34.0–46.6)
Hemoglobin: 12.8 g/dL (ref 11.1–15.9)
Immature Grans (Abs): 0 10*3/uL (ref 0.0–0.1)
Immature Granulocytes: 1 %
Lymphocytes Absolute: 2.5 10*3/uL (ref 0.7–3.1)
Lymphs: 30 %
MCH: 27 pg (ref 26.6–33.0)
MCHC: 32.7 g/dL (ref 31.5–35.7)
MCV: 83 fL (ref 79–97)
Monocytes Absolute: 0.7 10*3/uL (ref 0.1–0.9)
Monocytes: 8 %
Neutrophils Absolute: 5 10*3/uL (ref 1.4–7.0)
Neutrophils: 57 %
Platelets: 332 10*3/uL (ref 150–450)
RBC: 4.74 x10E6/uL (ref 3.77–5.28)
RDW: 15 % (ref 11.7–15.4)
WBC: 8.5 10*3/uL (ref 3.4–10.8)

## 2020-06-20 LAB — CMP14+EGFR
ALT: 12 IU/L (ref 0–32)
AST: 11 IU/L (ref 0–40)
Albumin/Globulin Ratio: 1.6 (ref 1.2–2.2)
Albumin: 4.5 g/dL (ref 3.7–4.7)
Alkaline Phosphatase: 110 IU/L (ref 44–121)
BUN/Creatinine Ratio: 17 (ref 12–28)
BUN: 11 mg/dL (ref 8–27)
Bilirubin Total: 0.2 mg/dL (ref 0.0–1.2)
CO2: 29 mmol/L (ref 20–29)
Calcium: 9.9 mg/dL (ref 8.7–10.3)
Chloride: 106 mmol/L (ref 96–106)
Creatinine, Ser: 0.64 mg/dL (ref 0.57–1.00)
GFR calc Af Amer: 103 mL/min/{1.73_m2} (ref >59)
GFR calc non Af Amer: 89 mL/min/{1.73_m2} (ref >59)
Globulin, Total: 2.8 g/dL (ref 1.5–4.5)
Glucose: 113 mg/dL — ABNORMAL HIGH (ref 65–99)
Potassium: 5.8 mmol/L — ABNORMAL HIGH (ref 3.5–5.2)
Sodium: 145 mmol/L — ABNORMAL HIGH (ref 134–144)
Total Protein: 7.3 g/dL (ref 6.0–8.5)

## 2020-06-20 LAB — CARDIOVASCULAR RISK ASSESSMENT

## 2020-06-20 LAB — LIPID PANEL WITH LDL/HDL RATIO
Cholesterol, Total: 221 mg/dL — ABNORMAL HIGH (ref 100–199)
HDL: 43 mg/dL (ref >39)
LDL Chol Calc (NIH): 149 mg/dL — ABNORMAL HIGH (ref 0–99)
LDL/HDL Ratio: 3.5 ratio — ABNORMAL HIGH (ref 0.0–3.2)
Triglycerides: 158 mg/dL — ABNORMAL HIGH (ref 0–149)
VLDL Cholesterol Cal: 29 mg/dL (ref 5–40)

## 2020-06-20 LAB — HEMOGLOBIN A1C
Est. average glucose Bld gHb Est-mCnc: 128 mg/dL
Hgb A1c MFr Bld: 6.1 % — ABNORMAL HIGH (ref 4.8–5.6)

## 2020-06-27 ENCOUNTER — Other Ambulatory Visit: Payer: Self-pay

## 2020-06-27 ENCOUNTER — Other Ambulatory Visit: Payer: Self-pay | Admitting: Family Medicine

## 2020-06-27 ENCOUNTER — Other Ambulatory Visit: Payer: Medicare Other

## 2020-06-27 DIAGNOSIS — E875 Hyperkalemia: Secondary | ICD-10-CM

## 2020-06-28 LAB — BMP8+EGFR
BUN/Creatinine Ratio: 21 (ref 12–28)
BUN: 18 mg/dL (ref 8–27)
CO2: 22 mmol/L (ref 20–29)
Calcium: 9.9 mg/dL (ref 8.7–10.3)
Chloride: 104 mmol/L (ref 96–106)
Creatinine, Ser: 0.85 mg/dL (ref 0.57–1.00)
GFR calc Af Amer: 79 mL/min/{1.73_m2} (ref >59)
GFR calc non Af Amer: 69 mL/min/{1.73_m2} (ref >59)
Glucose: 133 mg/dL — ABNORMAL HIGH (ref 65–99)
Potassium: 5.8 mmol/L — ABNORMAL HIGH (ref 3.5–5.2)
Sodium: 145 mmol/L — ABNORMAL HIGH (ref 134–144)

## 2020-07-01 LAB — CMP14+EGFR
ALT: 13 IU/L (ref 0–32)
AST: 26 IU/L (ref 0–40)
Albumin/Globulin Ratio: 1.5 (ref 1.2–2.2)
Albumin: 4.4 g/dL (ref 3.7–4.7)
Alkaline Phosphatase: 106 IU/L (ref 44–121)
BUN/Creatinine Ratio: 18 (ref 12–28)
BUN: 16 mg/dL (ref 8–27)
Bilirubin Total: <0.2 mg/dL (ref 0.0–1.2)
CO2: 25 mmol/L (ref 20–29)
Calcium: 9.7 mg/dL (ref 8.7–10.3)
Chloride: 102 mmol/L (ref 96–106)
Creatinine, Ser: 0.89 mg/dL (ref 0.57–1.00)
GFR calc Af Amer: 75 mL/min/{1.73_m2} (ref >59)
GFR calc non Af Amer: 65 mL/min/{1.73_m2} (ref >59)
Globulin, Total: 2.9 g/dL (ref 1.5–4.5)
Glucose: 134 mg/dL — ABNORMAL HIGH (ref 65–99)
Potassium: 5.7 mmol/L — ABNORMAL HIGH (ref 3.5–5.2)
Sodium: 143 mmol/L (ref 134–144)
Total Protein: 7.3 g/dL (ref 6.0–8.5)

## 2020-07-02 ENCOUNTER — Other Ambulatory Visit: Payer: Self-pay | Admitting: Family Medicine

## 2020-07-02 DIAGNOSIS — E875 Hyperkalemia: Secondary | ICD-10-CM

## 2020-07-06 ENCOUNTER — Other Ambulatory Visit: Payer: Self-pay

## 2020-07-06 ENCOUNTER — Other Ambulatory Visit: Payer: Medicare Other

## 2020-07-06 DIAGNOSIS — E875 Hyperkalemia: Secondary | ICD-10-CM

## 2020-07-07 LAB — COMPREHENSIVE METABOLIC PANEL
ALT: 13 IU/L (ref 0–32)
AST: 14 IU/L (ref 0–40)
Albumin/Globulin Ratio: 1.5 (ref 1.2–2.2)
Albumin: 4.1 g/dL (ref 3.7–4.7)
Alkaline Phosphatase: 109 IU/L (ref 44–121)
BUN/Creatinine Ratio: 23 (ref 12–28)
BUN: 18 mg/dL (ref 8–27)
Bilirubin Total: 0.2 mg/dL (ref 0.0–1.2)
CO2: 26 mmol/L (ref 20–29)
Calcium: 9.8 mg/dL (ref 8.7–10.3)
Chloride: 102 mmol/L (ref 96–106)
Creatinine, Ser: 0.79 mg/dL (ref 0.57–1.00)
GFR calc Af Amer: 86 mL/min/{1.73_m2} (ref >59)
GFR calc non Af Amer: 75 mL/min/{1.73_m2} (ref >59)
Globulin, Total: 2.7 g/dL (ref 1.5–4.5)
Glucose: 166 mg/dL — ABNORMAL HIGH (ref 65–99)
Potassium: 4.6 mmol/L (ref 3.5–5.2)
Sodium: 140 mmol/L (ref 134–144)
Total Protein: 6.8 g/dL (ref 6.0–8.5)

## 2020-07-15 DIAGNOSIS — A419 Sepsis, unspecified organism: Secondary | ICD-10-CM | POA: Diagnosis not present

## 2020-07-18 ENCOUNTER — Other Ambulatory Visit: Payer: Self-pay | Admitting: Family Medicine

## 2020-07-20 ENCOUNTER — Ambulatory Visit: Payer: Medicare Other

## 2020-07-24 ENCOUNTER — Ambulatory Visit (INDEPENDENT_AMBULATORY_CARE_PROVIDER_SITE_OTHER): Payer: Medicare Other | Admitting: Nurse Practitioner

## 2020-07-24 ENCOUNTER — Encounter: Payer: Self-pay | Admitting: Nurse Practitioner

## 2020-07-24 ENCOUNTER — Other Ambulatory Visit: Payer: Self-pay

## 2020-07-24 VITALS — BP 142/78 | HR 85 | Temp 97.3°F | Ht 63.0 in | Wt 188.0 lb

## 2020-07-24 DIAGNOSIS — J449 Chronic obstructive pulmonary disease, unspecified: Secondary | ICD-10-CM | POA: Diagnosis not present

## 2020-07-24 DIAGNOSIS — Z794 Long term (current) use of insulin: Secondary | ICD-10-CM

## 2020-07-24 DIAGNOSIS — E1165 Type 2 diabetes mellitus with hyperglycemia: Secondary | ICD-10-CM | POA: Diagnosis not present

## 2020-07-24 DIAGNOSIS — R21 Rash and other nonspecific skin eruption: Secondary | ICD-10-CM

## 2020-07-24 DIAGNOSIS — L209 Atopic dermatitis, unspecified: Secondary | ICD-10-CM | POA: Diagnosis not present

## 2020-07-24 DIAGNOSIS — I4891 Unspecified atrial fibrillation: Secondary | ICD-10-CM

## 2020-07-24 MED ORDER — CETIRIZINE HCL 10 MG PO TABS: 10.0000 mg | ORAL_TABLET | Freq: Every day | ORAL | 1 refills | Status: DC

## 2020-07-24 MED ORDER — TRIAMCINOLONE ACETONIDE 40 MG/ML IJ SUSP
80.0000 mg | Freq: Once | INTRAMUSCULAR | Status: AC
  Administered 2020-07-24: 80 mg via INTRAMUSCULAR

## 2020-07-24 NOTE — Progress Notes (Signed)
Acute Office Visit  Subjective:    Patient ID: Deshae Dickison, female    DOB: November 29, 1945, 74 y.o.   MRN: [id number]  Chief Complaint  Patient presents with  . Rash    HPI Patient is in today for rash. Rash is on face, torso, bilateral upper and lower extremities. It is pruritic. Onset was today. She has not tried any treatments at home.She denies any new medications, laundry detergents, or hygiene products. Denies history of similar rash. She recently was hospitalized with right lobe pneumonia. She completed course of po Clindamycin 600 mg (TID #18) yesterday. Other medical history includes Type 2 IDDM, A-fib, Hypertensive heart disease with HF, COPD, and OSA.   Past Medical History:  Diagnosis Date  . Arthritis   . Asthma   . Atrial fibrillation ([place])   . Cauda equina syndrome ([place]) 12/13/2019  . Complication of anesthesia   . COPD (chronic obstructive pulmonary disease) ([place])   . Depression   . Diabetes mellitus without complication ([place])    Type II  . Diabetic neuropathy, type II diabetes mellitus ([place]) 12/13/2019  . Dysrhythmia   . GERD (gastroesophageal reflux disease)   . HTN (hypertension)   . Hypercholesteremia   . 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[place]) 12/13/2019  . Morbid obesity ([place]) 12/13/2019  . Obstructive sleep apnea   . PONV (postoperative nausea and vomiting)    "not the last few times"  . RLS (restless legs syndrome)   . Sleep apnea     Past Surgical History:  Procedure Laterality Date  . ATRIAL FIBRILLATION ABLATION     x3  . COLONOSCOPY  01/04/2013   Moderate predominantly sigmoid diverticulosis. Small internal hemorrhoids. Otherwise normal colonosopy.   . LUMBAR WOUND DEBRIDEMENT N/A 08/09/2019   Procedure: LUMBAR WOUND DEBRIDEMENT Evacuation of  EPIDURAL HEMATOMA;  Surgeon: Newman Pies, MD;  Location: [facility];  Service: Neurosurgery;  Laterality: N/A;    Family History  Problem Relation Age of Onset  . Colon cancer  Paternal Aunt 23    Social History   Socioeconomic History  . Marital status: Married    Spouse name: Not on file  . Number of children: Not on file  . Years of education: Not on file  . Highest education level: Not on file  Occupational History  . Not on file  Tobacco Use  . Smoking status: Never Smoker  . Smokeless tobacco: Never Used  Vaping Use  . Vaping Use: Never used  Substance and Sexual Activity  . Alcohol use: Not Currently  . Drug use: Never  . Sexual activity: Not Currently  Other Topics Concern  . Not on file  Social History Narrative  . Not on file   Social Determinants of Health   Financial Resource Strain:   . Difficulty of Paying Living Expenses: Not on file  Food Insecurity:   . Worried About Charity fundraiser in the Last Year: Not on file  . Ran Out of Food in the Last Year: Not on file  Transportation Needs:   . Lack of Transportation (Medical): Not on file  . Lack of Transportation (Non-Medical): Not on file  Physical Activity:   . Days of Exercise per Week: Not on file  . Minutes of Exercise per Session: Not on file  Stress:   . Feeling of Stress : Not on file  Social Connections:   . Frequency of Communication with Friends and Family: Not on file  . Frequency of Social Gatherings  with Friends and Family: Not on file  . Attends Religious Services: Not on file  . Active Member of Clubs or Organizations: Not on file  . Attends Archivist Meetings: Not on file  . Marital Status: Not on file  Intimate Partner Violence:   . Fear of Current or Ex-Partner: Not on file  . Emotionally Abused: Not on file  . Physically Abused: Not on file  . Sexually Abused: Not on file    Outpatient Medications Prior to Visit  Medication Sig Dispense Refill  . albuterol (PROVENTIL) (2.5 MG/3ML) 0.083% nebulizer solution Take 2.5 mg by nebulization every 6 (six) hours as needed for wheezing or shortness of breath.     . busPIRone (BUSPAR) 10 MG tablet  TAKE 1 TABLET BY MOUTH TWICE A DAY 60 tablet 2  . calcium carbonate (TUMS) 500 MG chewable tablet Chew 2 tablets by mouth daily as needed for indigestion.     Marland Kitchen diltiazem (CARDIZEM CD) 300 MG 24 hr capsule Take 1 capsule (300 mg total) by mouth daily. 30 capsule 0  . DULoxetine (CYMBALTA) 60 MG capsule Take 60 mg by mouth every evening.     . fexofenadine (ALLEGRA) 180 MG tablet Take 180 mg by mouth daily as needed for allergies or rhinitis.    Marland Kitchen gabapentin (NEURONTIN) 600 MG tablet Take 1 tablet (600 mg total) by mouth at bedtime. 90 tablet 0  . Insulin Degludec (TRESIBA FLEXTOUCH) 200 UNIT/ML SOPN Inject 70 Units into the skin every evening.     . Insulin Pen Needle (FIFTY50 PEN NEEDLES) 31G X 8 MM MISC Inject 1 Units into the skin daily.    Marland Kitchen lisinopril (ZESTRIL) 40 MG tablet Take 1 tablet (40 mg total) by mouth daily. 90 tablet 1  . Misc Natural Products (LUTEIN 20) CAPS Take 20 mg by mouth every evening.    Marland Kitchen omeprazole (PRILOSEC) 40 MG capsule TAKE 1 CAPSULE(S) BY MOUTH DAILY IN THE MORNING 90 capsule 1  . Potassium Gluconate 550 (90 K) MG TABS Take 550 mg by mouth daily as needed (cramps).    . rivaroxaban (XARELTO) 20 MG TABS tablet Take 20 mg by mouth daily with supper.    Marland Kitchen rOPINIRole (REQUIP) 3 MG tablet TAKE 1 TABLET BY MOUTH TWICE A DAY 180 tablet 0  . Semaglutide,0.25 or 0.5MG /DOS, 2 MG/1.5ML SOPN Inject 0.5 mg into the skin every Monday.     Marland Kitchen VITAMIN D-VITAMIN K PO Take 1 tablet by mouth every evening.     No facility-administered medications prior to visit.    Allergies  Allergen Reactions  . Codeine Nausea Only    Review of Systems  Constitutional: Negative for fatigue and fever.  HENT: Negative for congestion, ear pain, sinus pressure and sore throat.   Eyes: Negative for pain.  Respiratory: Negative for cough, chest tightness, shortness of breath and wheezing.   Cardiovascular: Negative for chest pain and palpitations.  Gastrointestinal: Positive for constipation.  Negative for abdominal pain, diarrhea, nausea and vomiting.  Genitourinary: Negative for dysuria and hematuria.  Musculoskeletal: Negative for arthralgias, back pain, joint swelling and myalgias.  Skin: Positive for rash.  Neurological: Negative for dizziness, weakness and headaches.  Psychiatric/Behavioral: Negative for dysphoric mood. The patient is not nervous/anxious.        Objective:    Physical Exam Vitals reviewed.  Constitutional:      Appearance: Normal appearance.  HENT:     Head: Normocephalic.     Mouth/Throat:     Mouth:  Mucous membranes are moist.  Cardiovascular:     Rate and Rhythm: Normal rate. Rhythm irregular.     Pulses: Normal pulses.     Heart sounds: Normal heart sounds.  Pulmonary:     Effort: Pulmonary effort is normal.     Comments: Lung sounds decreased in all fields Abdominal:     General: Bowel sounds are normal.     Palpations: Abdomen is soft.  Skin:    General: Skin is warm and dry.     Capillary Refill: Capillary refill takes less than 2 seconds.     Findings: Rash present. Rash is macular, papular and urticarial.     Comments: Raised maculopapular rash noted to face, torso, upper and lower extremities.  Neurological:     General: No focal deficit present.     Mental Status: She is alert and oriented to person, place, and time.  Psychiatric:        Mood and Affect: Mood normal.        Behavior: Behavior normal.        Thought Content: Thought content normal.        Judgment: Judgment normal.     BP (!) 142/78 (BP Location: Left Arm, Patient Position: Sitting)   Pulse 85   Temp (!) 97.3 F (36.3 C) (Temporal)   Ht 5\' 3"  (1.6 m)   Wt 188 lb (85.3 kg)   SpO2 96%   BMI 33.30 kg/m  Wt Readings from Last 3 Encounters:  07/24/20 188 lb (85.3 kg)  06/19/20 190 lb 9.6 oz (86.5 kg)  04/05/20 185 lb 12.8 oz (84.3 kg)    Health Maintenance Due  Topic Date Due  . Hepatitis C Screening  Never done  . TETANUS/TDAP  02/28/2019     There are no preventive care reminders to display for this patient.   No results found for: TSH Lab Results  Component Value Date   WBC 8.5 06/19/2020   HGB 12.8 06/19/2020   HCT 39.1 06/19/2020   MCV 83 06/19/2020   PLT 332 06/19/2020   Lab Results  Component Value Date   NA 140 07/06/2020   K 4.6 07/06/2020   CO2 26 07/06/2020   GLUCOSE 166 (H) 07/06/2020   BUN 18 07/06/2020   CREATININE 0.79 07/06/2020   BILITOT 0.2 07/06/2020   ALKPHOS 109 07/06/2020   AST 14 07/06/2020   ALT 13 07/06/2020   PROT 6.8 07/06/2020   ALBUMIN 4.1 07/06/2020   CALCIUM 9.8 07/06/2020   ANIONGAP 14 08/10/2019   Lab Results  Component Value Date   CHOL 221 (H) 06/19/2020   Lab Results  Component Value Date   HDL 43 06/19/2020   Lab Results  Component Value Date   LDLCALC 149 (H) 06/19/2020   Lab Results  Component Value Date   TRIG 158 (H) 06/19/2020   No results found for: [facility] Lab Results  Component Value Date   HGBA1C 6.1 (H) 06/19/2020         Assessment & Plan:   1. Atopic dermatitis, unspecified type - cetirizine (ZYRTEC) 10 MG tablet; Take 1 tablet (10 mg total) by mouth daily.  Dispense: 90 tablet; Refill: 1 - triamcinolone acetonide (KENALOG-40) injection 80 mg  2. Atrial fibrillation, unspecified type (H[facility]) -Continue Cardizem 300 mg daily -Continue Xarelto 20 mg daily  3.Type 2 diabetes mellitus with hyperglycemia, with long-term current use of insulin ([facility]) -Increase Ozempic to 1mg  weekly -Monitor blood glucose  4.Chronic obstructive pulmonary disease, unspecified COPD type (  [place]) -Continue Albuterol PRN  Begin Zyrtec 10 mg daily for itching Kenalog 80 mg IM given for rash Increase Ozempic to 1 mg weekly Call if symptoms fail to improve or worsen   Follow-Up: PRN if symptoms fail to improve or worsen  Jerrell Belfast, [place], [place]

## 2020-07-24 NOTE — Patient Instructions (Addendum)
Begin Zyrtec 10 mg daily for itching Kenalog 80 mg IM given for rash Increase Ozempic to 1 mg weekly Call if symptoms fail to improve or worsen   Cetirizine tablets What is this medicine? CETIRIZINE (se TI ra zeen) is an antihistamine. This medicine is used to treat or prevent symptoms of allergies. It is also used to help reduce itchy skin rash and hives. This medicine may be used for other purposes; ask your health care provider or pharmacist if you have questions. COMMON BRAND NAME(S): All Day Allergy, Allergy Relief, Zyrtec, Zyrtec Hives Relief What should I tell my health care provider before I take this medicine? They need to know if you have any of these conditions:  kidney disease  liver disease  an unusual or allergic reaction to cetirizine, hydroxyzine, other medicines, foods, dyes, or preservatives  pregnant or trying to get pregnant  breast-feeding How should I use this medicine? Take this medicine by mouth with a glass of water. Follow the directions on the prescription label. You can take this medicine with food or on an empty stomach. Take your medicine at regular times. Do not take more often than directed. You may need to take this medicine for several days before your symptoms improve. Talk to your pediatrician regarding the use of this medicine in children. Special care may be needed. While this drug may be prescribed for children as young as 4 years of age for selected conditions, precautions do apply. Overdosage: If you think you have taken too much of this medicine contact a poison control center or emergency room at once. NOTE: This medicine is only for you. Do not share this medicine with others. What if I miss a dose? If you miss a dose, take it as soon as you can. If it is almost time for your next dose, take only that dose. Do not take double or extra doses. What may interact with this medicine?  alcohol  certain medicines for anxiety or sleep  narcotic  medicines for pain  other medicines for colds or allergies This list may not describe all possible interactions. Give your health care provider a list of all the medicines, herbs, non-prescription drugs, or dietary supplements you use. Also tell them if you smoke, drink alcohol, or use illegal drugs. Some items may interact with your medicine. What should I watch for while using this medicine? Visit your doctor or health care professional for regular checks on your health. Tell your doctor if your symptoms do not improve. You may get drowsy or dizzy. Do not drive, use machinery, or do anything that needs mental alertness until you know how this medicine affects you. Do not stand or sit up quickly, especially if you are an older patient. This reduces the risk of dizzy or fainting spells. Your mouth may get dry. Chewing sugarless gum or sucking hard candy, and drinking plenty of water may help. Contact your doctor if the problem does not go away or is severe. What side effects may I notice from receiving this medicine? Side effects that you should report to your doctor or health care professional as soon as possible:  allergic reactions like skin rash, itching or hives, swelling of the face, lips, or tongue  changes in vision or hearing  fast or irregular heartbeat  trouble passing urine or change in the amount of urine Side effects that usually do not require medical attention (report to your doctor or health care professional if they continue or are bothersome):  dizziness  dry mouth  irritability  sore throat  stomach pain  tiredness This list may not describe all possible side effects. Call your doctor for medical advice about side effects. You may report side effects to FDA at 1-800-FDA-1088. Where should I keep my medicine? Keep out of the reach of children. Store at room temperature between 15 and 30 degrees C (59 and 86 degrees F). Throw away any unused medicine after the  expiration date. NOTE: This sheet is a summary. It may not cover all possible information. If you have questions about this medicine, talk to your doctor, pharmacist, or health care provider.  2020 Elsevier/Gold Standard (2014-08-30 13:44:42) Triamcinolone injection What is this medicine? TRIAMCINOLONE (trye am SIN oh lone) is a corticosteroid. It helps to reduce swelling, redness, itching, and allergic reactions. This medicine is used to treat allergies, arthritis, asthma, skin problems, and many other conditions. This medicine may be used for other purposes; ask your health care provider or pharmacist if you have questions. COMMON BRAND NAME(S): Aristocort, Aristocort Forte, Aristospan, Arze-Ject-A, Kenalog, Tac-3, Triamonide, Triesence What should I tell my health care provider before I take this medicine? They need to know if you have any of these conditions:  Cushing's syndrome  diabetes  glaucoma  heart disease  high blood pressure  infection, like tuberculosis, herpes, measles, chickenpox, or fungal infection  liver disease  low levels of potassium in the blood  mental illness  myasthenia gravis  osteoporosis  recent heart attack  seizures  stomach or intestine disease  thyroid disease  an unusual or allergic reaction to triamcinolone, corticosteroids, benzyl alcohol, other medicines, foods, dyes, or preservatives  pregnant or trying to get pregnant  breast-feeding How should I use this medicine? This medicine is injected by a health care professional. After your dose follow your doctor's instructions for your care. Contact your pediatrician regarding the use of this medicine in children. Special care may be needed. Overdosage: If you think you have taken too much of this medicine contact a poison control center or emergency room at once. NOTE: This medicine is only for you. Do not share this medicine with others. What if I miss a dose? This does not  apply. What may interact with this medicine?  antiviral medicines for HIV or AIDS  aspirin  certain medicines for fungal infections like ketoconazole and itraconazole  clarithromycin  mifepristone  nefazodone  other steroid medicines  vaccines and other immunization products This list may not describe all possible interactions. Give your health care provider a list of all the medicines, herbs, non-prescription drugs, or dietary supplements you use. Also tell them if you smoke, drink alcohol, or use illegal drugs. Some items may interact with your medicine. What should I watch for while using this medicine? Visit your doctor or health care professional for regular checks on your progress. If you are taking this medicine for a long time, carry an identification card with your name, the type and dose of medicine, and your doctor's name and address. Do not come in contact with people who have chickenpox or the measles while you are taking this medicine. If you do, call your doctor right away. This medicine may increase blood sugar. Ask your healthcare provider if changes in diet or medicines are needed if you have diabetes. What side effects may I notice from receiving this medicine? Side effects that you should report to your doctor or health care professional as soon as possible:  allergic reactions like skin rash,  itching or hives, swelling of the face, lips, or tongue  black, tarry stools  changes in emotions or moods  eye pain  increased blood pressure  increased joint pain and swelling at site where injected  lumpy, thin skin at site where injected  rounding of face  seizures  signs and symptoms of high blood sugar such as being more thirsty or hungry or having to urinate more than normal. You may also feel very tired or have blurry vision.  signs and symptoms of infection like fever or chills; cough; sore throat; pain or trouble passing urine  slow growth in  children (if used for longer periods of time)  sores that do not heal  stomach pain  swelling of ankles, feet, hands  trouble sleeping  unusual bleeding or bruising  unusual increased growth of hair on the face or body Side effects that usually do not require medical attention (report to your doctor or health care professional if they continue or are bothersome):  headache  nausea  pain, redness, or irritation at site where injected  upset stomach  weight gain This list may not describe all possible side effects. Call your doctor for medical advice about side effects. You may report side effects to FDA at 1-800-FDA-1088. Where should I keep my medicine? Keep out of the reach of children. Store at room temperature between 20 and 25 degrees C (68 and 77 degrees F). Do not freeze. Protect from temperatures below 20 degrees C (68 degrees F). Protect from light. Keep in the original container. Store vial upright. Throw away any unused medicine after the expiration date. NOTE: This sheet is a summary. It may not cover all possible information. If you have questions about this medicine, talk to your doctor, pharmacist, or health care provider.  2020 Elsevier/Gold Standard (2018-05-07 10:55:56) Drug Rash  A drug rash occurs when a medicine causes a change in the color or texture of the skin. It can develop minutes, hours, or days after you take the medicine. The rash may appear on a small area of skin or all over your body. What are the causes? This condition is usually caused by your body's reaction (allergy) to a medicine. It can also be caused by exposure to sunlight after taking a medicine that makes your skin sensitive to light. Though any medicine can cause a rash or reaction, medicines that are more likely to cause rashes include:  Penicillin.  Antibiotic medicines.  Medicines that treat seizures.  Medicines that treat cancer (chemotherapy).  Aspirin and other  NSAIDs.  Injectable dyes that contain iodine.  Insulin. What are the signs or symptoms? Symptoms of this condition include:  Redness.  Tiny bumps.  Peeling.  Itching.  Itchy welts (hives).  Swelling. How is this diagnosed? This condition may be diagnosed based on:  A physical exam.  Tests to find out which medicine caused the rash. These tests may include: ? Skin tests. ? Blood tests. ? Challenge test. For this test, you stop taking all the medicines that you do not need to take. Then, you start taking them again by adding back one medicine at a time. How is this treated? This condition is treated with medicines, including:  Antihistamine. This may be given to relieve itching.  NSAIDs. These may be given to reduce swelling and to treat pain.  A steroid medicine. This may be given to reduce swelling. The rash usually goes away when you stop taking the medicine that caused it. Follow these  instructions at home:  Take over-the-counter and prescription medicines only as told by your health care provider.  Tell all your health care providers about any medicine reactions that you have had in the past.  If your rash was caused by sensitivity to sunlight, and while your rash is healing: ? Avoid being in the sun if possible, especially when it is strongest, usually between 10 a.m. and 4 p.m. ? Cover your skin with pants, long sleeves, and a hat when you are exposed to sunlight.  If you have hives: ? Take a cool shower or use a cool compress to relieve itchiness. ? Take over-the-counter antihistamines, as recommended by your health care provider, until the hives are gone. Hives are not contagious.  Keep all follow-up visits as told by your health care provider. This is important. Contact a health care provider if you have:  A fever.  A rash that is not going away.  A rash that gets worse.  A rash that comes back.  Wheezing or coughing. Get help right away  if:  You start to have breathing problems.  You start to have shortness of breath.  Your face or throat starts to swell.  You have severe weakness with dizziness or fainting.  You have chest pain. These symptoms may represent a serious problem that is an emergency. Do not wait to see if the symptoms will go away. Get medical help right away. Call your local emergency services (911 in the [place] Do not drive yourself to the hospital. Summary  A drug rash occurs when a medicine causes a change in the color or texture of the skin. The rash may appear on a small area of skin or all over your body.  It can develop minutes, hours, or days after you take the medicine.  Your health care provider will do various tests to determine what medicine caused your rash.  The rash may be treated with medicine to relieve itching, swelling, and pain. This information is not intended to replace advice given to you by your health care provider. Make sure you discuss any questions you have with your health care provider. Document Revised: 07/18/2017 Document Reviewed: 06/26/2017 Elsevier Patient Education  2020 [employer].

## 2020-07-27 ENCOUNTER — Telehealth: Payer: Self-pay

## 2020-07-27 NOTE — Telephone Encounter (Signed)
Caitlin [name] called to report that her rash is worse and more uncomfortable.  Symptoms discuss with Tonita Phoenix NP and Dr. Tobie Poet. They advised referral to [facility].  Appointment scheduled for today @ 9:00 with Dr. Michele Mcalpine.

## 2020-07-28 ENCOUNTER — Ambulatory Visit (INDEPENDENT_AMBULATORY_CARE_PROVIDER_SITE_OTHER): Payer: Medicare Other | Admitting: Family Medicine

## 2020-07-28 ENCOUNTER — Other Ambulatory Visit: Payer: Self-pay

## 2020-07-28 VITALS — BP 130/82 | HR 91 | Temp 97.4°F | Ht 63.0 in | Wt 190.0 lb

## 2020-07-28 DIAGNOSIS — D6869 Other thrombophilia: Secondary | ICD-10-CM

## 2020-07-28 DIAGNOSIS — Z794 Long term (current) use of insulin: Secondary | ICD-10-CM

## 2020-07-28 DIAGNOSIS — L209 Atopic dermatitis, unspecified: Secondary | ICD-10-CM

## 2020-07-28 DIAGNOSIS — K219 Gastro-esophageal reflux disease without esophagitis: Secondary | ICD-10-CM

## 2020-07-28 DIAGNOSIS — G4733 Obstructive sleep apnea (adult) (pediatric): Secondary | ICD-10-CM

## 2020-07-28 DIAGNOSIS — E1140 Type 2 diabetes mellitus with diabetic neuropathy, unspecified: Secondary | ICD-10-CM

## 2020-07-28 DIAGNOSIS — I10 Essential (primary) hypertension: Secondary | ICD-10-CM

## 2020-07-28 DIAGNOSIS — F330 Major depressive disorder, recurrent, mild: Secondary | ICD-10-CM

## 2020-07-28 DIAGNOSIS — Z9989 Dependence on other enabling machines and devices: Secondary | ICD-10-CM

## 2020-07-28 MED ORDER — DILTIAZEM HCL ER COATED BEADS 300 MG PO CP24
300.0000 mg | ORAL_CAPSULE | Freq: Every day | ORAL | 0 refills | Status: DC
Start: 2020-07-28 — End: 2020-10-22

## 2020-07-28 MED ORDER — ROSUVASTATIN CALCIUM 10 MG PO TABS: 10.0000 mg | ORAL_TABLET | ORAL | 0 refills | Status: DC

## 2020-07-28 NOTE — Progress Notes (Signed)
Subjective:  Patient ID: Caitlin [name], female    DOB: 1945-12-09  Age: 74 y.o. MRN: [id number]  Chief Complaint  Patient presents with  . Hypertension    HPI Hypertension - increased dilitazem ot 300 mg daily. Continue lisinopril. Pain in BL lower extremities: Gabapentin 600 mg once at night.  Legs bother her during the day.  GERD on omeprazole DM - seeing endocrinology. A1C was 6.1. Low sugar diet. Exercises. Checks feet daily.  Hyperlipidemia: LDL elevated. Low fat diet.   Current Outpatient Medications on File Prior to Visit  Medication Sig Dispense Refill  . albuterol (PROVENTIL) (2.5 MG/3ML) 0.083% nebulizer solution Take 2.5 mg by nebulization every 6 (six) hours as needed for wheezing or shortness of breath.     . busPIRone (BUSPAR) 10 MG tablet TAKE 1 TABLET BY MOUTH TWICE A DAY 60 tablet 2  . calcium carbonate (TUMS) 500 MG chewable tablet Chew 2 tablets by mouth daily as needed for indigestion.     . cetirizine (ZYRTEC) 10 MG tablet Take 1 tablet (10 mg total) by mouth daily. 90 tablet 1  . diphenhydrAMINE (BENADRYL) 25 MG tablet Take 25 mg by mouth every 6 (six) hours as needed.    . DULoxetine (CYMBALTA) 60 MG capsule Take 60 mg by mouth every evening.     . fexofenadine (ALLEGRA) 180 MG tablet Take 180 mg by mouth daily as needed for allergies or rhinitis.    Marland Kitchen gabapentin (NEURONTIN) 600 MG tablet Take 1 tablet (600 mg total) by mouth at bedtime. 90 tablet 0  . Insulin Degludec (TRESIBA FLEXTOUCH) 200 UNIT/ML SOPN Inject 70 Units into the skin every evening.     . Insulin Pen Needle (FIFTY50 PEN NEEDLES) 31G X 8 MM MISC Inject 1 Units into the skin daily.    Marland Kitchen lisinopril (ZESTRIL) 40 MG tablet Take 1 tablet (40 mg total) by mouth daily. 90 tablet 1  . Misc Natural Products (LUTEIN 20) CAPS Take 20 mg by mouth every evening.    Marland Kitchen omeprazole (PRILOSEC) 40 MG capsule TAKE 1 CAPSULE(S) BY MOUTH DAILY IN THE MORNING 90 capsule 1  . Potassium Gluconate 550 (90 K) MG TABS Take  550 mg by mouth daily as needed (cramps).    . predniSONE (STERAPRED UNI-PAK 48 TAB) 10 MG (48) TBPK tablet Take by mouth daily.    . rivaroxaban (XARELTO) 20 MG TABS tablet Take 20 mg by mouth daily with supper.    Marland Kitchen rOPINIRole (REQUIP) 3 MG tablet TAKE 1 TABLET BY MOUTH TWICE A DAY 180 tablet 0  . Semaglutide,0.25 or 0.5MG /DOS, 2 MG/1.5ML SOPN Inject 0.5 mg into the skin every Monday.     Marland Kitchen VITAMIN D-VITAMIN K PO Take 1 tablet by mouth every evening.     No current facility-administered medications on file prior to visit.   Past Medical History:  Diagnosis Date  . Arthritis   . Asthma   . Atrial fibrillation ([place])   . Cauda equina syndrome ([place]) 12/13/2019  . Complication of anesthesia   . COPD (chronic obstructive pulmonary disease) ([place])   . Depression   . Diabetes mellitus without complication ([place])    Type II  . Diabetic neuropathy, type II diabetes mellitus ([place]) 12/13/2019  . Dysrhythmia   . GERD (gastroesophageal reflux disease)   . HTN (hypertension)   . Hypercholesteremia   . 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[place]) 12/13/2019  . Morbid obesity ([place]) 12/13/2019  . Obstructive sleep apnea   . PONV (  postoperative nausea and vomiting)    "not the last few times"  . RLS (restless legs syndrome)   . Sleep apnea    Past Surgical History:  Procedure Laterality Date  . ATRIAL FIBRILLATION ABLATION     x3  . COLONOSCOPY  01/04/2013   Moderate predominantly sigmoid diverticulosis. Small internal hemorrhoids. Otherwise normal colonosopy.   . LUMBAR WOUND DEBRIDEMENT N/A 08/09/2019   Procedure: LUMBAR WOUND DEBRIDEMENT Evacuation of  EPIDURAL HEMATOMA;  Surgeon: Newman Pies, MD;  Location: [facility];  Service: Neurosurgery;  Laterality: N/A;    Family History  Problem Relation Age of Onset  . Colon cancer Paternal Aunt 4   Social History   Socioeconomic History  . Marital status: Married    Spouse name: Not on file  . Number of children: Not on  file  . Years of education: Not on file  . Highest education level: Not on file  Occupational History  . Not on file  Tobacco Use  . Smoking status: Never Smoker  . Smokeless tobacco: Never Used  Vaping Use  . Vaping Use: Never used  Substance and Sexual Activity  . Alcohol use: Not Currently  . Drug use: Never  . Sexual activity: Not Currently  Other Topics Concern  . Not on file  Social History Narrative  . Not on file   Social Determinants of Health   Financial Resource Strain: Not on file  Food Insecurity: Not on file  Transportation Needs: Not on file  Physical Activity: Not on file  Stress: Not on file  Social Connections: Not on file    Review of Systems  Constitutional: Negative for chills, fatigue and fever.  HENT: Positive for congestion and rhinorrhea. Negative for ear pain, postnasal drip, sinus pressure, sinus pain and sore throat.   Respiratory: Positive for cough and shortness of breath.   Cardiovascular: Positive for chest pain (when taking a deep breath).  Gastrointestinal: Positive for nausea and vomiting. Negative for diarrhea.  Endocrine: Positive for polyuria.  Genitourinary: Positive for dysuria.  Musculoskeletal: Positive for arthralgias and myalgias.  Skin: Positive for rash (currently being treated).  Neurological: Positive for weakness and headaches. Negative for dizziness.     Objective:  BP 130/82   Pulse 91   Temp (!) 97.4 F (36.3 C)   Ht 5\' 3"  (1.6 m)   Wt 190 lb (86.2 kg)   SpO2 98%   BMI 33.66 kg/m   BP/Weight 07/28/2020 07/24/2020 52/03/4131  Systolic BP 440 102 725  Diastolic BP 82 78 92  Wt. (Lbs) 190 188 190.6  BMI 33.66 33.3 33.76    Physical Exam Vitals reviewed.  Constitutional:      Appearance: Normal appearance. She is normal weight.  Neck:     Vascular: No carotid bruit.  Cardiovascular:     Rate and Rhythm: Normal rate. Rhythm irregular.     Pulses: Normal pulses.     Heart sounds: Normal heart sounds.   Pulmonary:     Effort: Pulmonary effort is normal. No respiratory distress.     Breath sounds: Normal breath sounds.  Abdominal:     General: Abdomen is flat. Bowel sounds are normal.     Palpations: Abdomen is soft.     Tenderness: There is no abdominal tenderness.  Neurological:     Mental Status: She is alert and oriented to person, place, and time.  Psychiatric:        Mood and Affect: Mood normal.  Behavior: Behavior normal.     Diabetic Foot Exam - Simple   Simple Foot Form Diabetic Foot exam was performed with the following findings: Yes 07/28/2020 11:00 PM  Visual Inspection See comments: Yes Sensation Testing Intact to touch and monofilament testing bilaterally: Yes Pulse Check Posterior Tibialis and Dorsalis pulse intact bilaterally: Yes Comments Calluses. Thickened toe nails.       Lab Results  Component Value Date   WBC 8.5 06/19/2020   HGB 12.8 06/19/2020   HCT 39.1 06/19/2020   PLT 332 06/19/2020   GLUCOSE 166 (H) 07/06/2020   CHOL 221 (H) 06/19/2020   TRIG 158 (H) 06/19/2020   HDL 43 06/19/2020   LDLCALC 149 (H) 06/19/2020   ALT 13 07/06/2020   AST 14 07/06/2020   NA 140 07/06/2020   K 4.6 07/06/2020   CL 102 07/06/2020   CREATININE 0.79 07/06/2020   BUN 18 07/06/2020   CO2 26 07/06/2020   INR 0.9 08/04/2019   HGBA1C 6.1 (H) 06/19/2020      Assessment & Plan:   1. Gastroesophageal reflux disease without esophagitis The current medical regimen is effective;  continue present plan and medications.  2. Type 2 diabetes mellitus with diabetic neuropathy, with long-term current use of insulin (HCC) Control: good Recommend check sugars fasting daily. Recommend check feet daily. Recommend annual eye exams. Medicines: no changes Continue to work on eating a healthy diet and exercise.   3. Essential hypertension Well controlled.  No changes to medicines.  Continue to work on eating a healthy diet and exercise.   4. Acquired  thrombophilia ([place]) Continue eliquis.   5. OSA on CPAP Continue cpap. Benefits. Compliant.  6. Mild recurrent major depression ([place])  The current medical regimen is effective;  continue present plan and medications.  7. Dermatitis  - improved. Continue presnisone.  Continue antihistamines.  Meds ordered this encounter  Medications  . rosuvastatin (CRESTOR) 10 MG tablet    Sig: Take 1 tablet (10 mg total) by mouth 2 (two) times a week.    Dispense:  8 tablet    Refill:  0  . diltiazem (CARDIZEM CD) 300 MG 24 hr capsule    Sig: Take 1 capsule (300 mg total) by mouth daily.    Dispense:  90 capsule    Refill:  0   Follow-up: Return in about 3 months (around 10/26/2020) for fasting.  An After Visit Summary was printed and given to the patient.  Rochel Brome, MD [facility] [phone]

## 2020-07-28 NOTE — Patient Instructions (Signed)
Start crestor 10 mg one twice a week.  Continue diltiazem CD 300 mg once daily.

## 2020-07-30 ENCOUNTER — Encounter: Payer: Self-pay | Admitting: Family Medicine

## 2020-08-02 ENCOUNTER — Other Ambulatory Visit: Payer: Self-pay | Admitting: Family Medicine

## 2020-08-02 DIAGNOSIS — G2581 Restless legs syndrome: Secondary | ICD-10-CM

## 2020-08-03 ENCOUNTER — Ambulatory Visit (INDEPENDENT_AMBULATORY_CARE_PROVIDER_SITE_OTHER): Payer: Medicare Other

## 2020-08-03 ENCOUNTER — Other Ambulatory Visit: Payer: Self-pay

## 2020-08-03 DIAGNOSIS — Z23 Encounter for immunization: Secondary | ICD-10-CM | POA: Diagnosis not present

## 2020-08-03 NOTE — Progress Notes (Signed)
   Covid-19 Vaccination Clinic  Name:  Caitlin [name]    MRN: [id number] DOB: [birth year]  08/03/2020  [name] was observed post Covid-19 immunization for 15 minutes without incident. She was provided with Vaccine Information Sheet and instruction to access the V-Safe system.   [name] was instructed to call 911 with any severe reactions post vaccine: Marland Kitchen Difficulty breathing  . Swelling of face and throat  . A fast heartbeat  . A bad rash all over body  . Dizziness and weakness   Immunizations Administered    Name Date Dose VIS Date Route   Pfizer COVID-19 Vaccine 08/03/2020  2:00 PM 0.3 mL 06/07/2020 Intramuscular   Manufacturer: [place]   Lot: [id number]   [place]: [id number]

## 2020-08-08 ENCOUNTER — Other Ambulatory Visit: Payer: Self-pay | Admitting: Family Medicine

## 2020-08-09 ENCOUNTER — Other Ambulatory Visit: Payer: Self-pay

## 2020-08-09 MED ORDER — INSULIN PEN NEEDLE 32G X 4 MM MISC: 1.0000 | Freq: Two times a day (BID) | 2 refills | Status: DC

## 2020-08-17 ENCOUNTER — Other Ambulatory Visit: Payer: Self-pay | Admitting: Family Medicine

## 2020-09-21 ENCOUNTER — Other Ambulatory Visit: Payer: Self-pay | Admitting: Family Medicine

## 2020-10-02 DIAGNOSIS — R234 Changes in skin texture: Secondary | ICD-10-CM | POA: Insufficient documentation

## 2020-10-02 DIAGNOSIS — M21611 Bunion of right foot: Secondary | ICD-10-CM | POA: Insufficient documentation

## 2020-10-02 DIAGNOSIS — B351 Tinea unguium: Secondary | ICD-10-CM | POA: Insufficient documentation

## 2020-10-10 ENCOUNTER — Ambulatory Visit (INDEPENDENT_AMBULATORY_CARE_PROVIDER_SITE_OTHER): Payer: Medicare Other | Admitting: Family Medicine

## 2020-10-10 ENCOUNTER — Other Ambulatory Visit: Payer: Self-pay

## 2020-10-10 VITALS — BP 132/90 | HR 106 | Temp 97.6°F | Resp 18 | Ht 63.0 in | Wt 193.0 lb

## 2020-10-10 DIAGNOSIS — R42 Dizziness and giddiness: Secondary | ICD-10-CM | POA: Diagnosis not present

## 2020-10-10 DIAGNOSIS — R110 Nausea: Secondary | ICD-10-CM

## 2020-10-10 DIAGNOSIS — S0990XA Unspecified injury of head, initial encounter: Secondary | ICD-10-CM

## 2020-10-10 DIAGNOSIS — G44311 Acute post-traumatic headache, intractable: Secondary | ICD-10-CM

## 2020-10-10 NOTE — Progress Notes (Signed)
Subjective:  Patient ID: Caitlin [name], female    DOB: 03-Jun-1946  Age: 75 y.o. MRN: [id number]  Chief Complaint  Patient presents with   Follow-up   Fall    HPI S/P fall 09/26/2020. Tripped on uneven cement floor at [employer]. Pt was seen at [facility]. Has had nausea, dizziness, and headaches since the fall.   Her ct scan of brain was normal.   CT chest/abdomen/chest IMPRESSION:  1. Subacute or chronic bilateral rib fractures. No acute rib fractures.  2. No free fluid or hemorrhage in the abdomen or pelvis. No findings for acute solid organ injury.  3. Probable uterine fibroid. Nonemergent pelvic ultrasound would be helpful for better evaluation.  4. Few scattered lung nodules the largest being < 4 mm   CT of c spine  FINDINGS:  Vertebral bodies appear appropriate in height. There is straightening of the normal cervical curvature as a result of chronic degenerative disc disease and facet arthropathy. There are no acute cervical spine fractures identified. There is osseous fusion of the left-sided C2-C3 and C3-C4 facets. There is osseous fusion of the right and left-sided facets of C5, C6, and C7. Severe narrowing of disc spaces at C5-C6 and C6-C7 with partial vertebral body fusion of C6-C7. Moderate diffuse neural foraminal stenosis. No significant osseous central canal stenosis is identified. No concerning paravertebral soft tissue abnormalities are identified.   Current Outpatient Medications on File Prior to Visit  Medication Sig Dispense Refill   albuterol (PROVENTIL) (2.5 MG/3ML) 0.083% nebulizer solution Take 2.5 mg by nebulization every 6 (six) hours as needed for wheezing or shortness of breath.      busPIRone (BUSPAR) 10 MG tablet TAKE 1 TABLET BY MOUTH TWICE A DAY 60 tablet 2   calcium carbonate (TUMS) 500 MG chewable tablet Chew 2 tablets by mouth daily as needed for indigestion.      cetirizine (ZYRTEC) 10 MG tablet Take 1 tablet (10 mg total) by  mouth daily. 90 tablet 1   diphenhydrAMINE (BENADRYL) 25 MG tablet Take 25 mg by mouth every 6 (six) hours as needed.     DULoxetine (CYMBALTA) 60 MG capsule TAKE 1 CAPSULE BY MOUTH TWICE A DAY 180 capsule 1   fexofenadine (ALLEGRA) 180 MG tablet Take 180 mg by mouth daily as needed for allergies or rhinitis.     gabapentin (NEURONTIN) 600 MG tablet Take 1 tablet (600 mg total) by mouth at bedtime. 90 tablet 0   Insulin Degludec (TRESIBA FLEXTOUCH) 200 UNIT/ML SOPN Inject 70 Units into the skin every evening.      Insulin Pen Needle (FIFTY50 PEN NEEDLES) 31G X 8 MM MISC Inject 1 Units into the skin daily.     Insulin Pen Needle 32G X 4 MM MISC 1 each by Does not apply route in the morning and at bedtime. 100 each 2   lisinopril (ZESTRIL) 20 MG tablet TAKE 1 TABLET BY MOUTH EVERY DAY 90 tablet 0   lisinopril (ZESTRIL) 40 MG tablet Take 1 tablet (40 mg total) by mouth daily. 90 tablet 1   Misc Natural Products (LUTEIN 20) CAPS Take 20 mg by mouth every evening.     omeprazole (PRILOSEC) 40 MG capsule TAKE 1 CAPSULE(S) BY MOUTH DAILY IN THE MORNING 90 capsule 1   Potassium Gluconate 550 (90 K) MG TABS Take 550 mg by mouth daily as needed (cramps).     rivaroxaban (XARELTO) 20 MG TABS tablet Take 20 mg by mouth daily with supper.  rosuvastatin (CRESTOR) 10 MG tablet TAKE 1 TABLET (10 MG TOTAL) BY MOUTH 2 (TWO) TIMES A WEEK. 24 tablet 0   Semaglutide,0.25 or 0.5MG /DOS, 2 MG/1.5ML SOPN Inject 0.5 mg into the skin every Monday.      VITAMIN D-VITAMIN K PO Take 1 tablet by mouth every evening.     No current facility-administered medications on file prior to visit.   Past Medical History:  Diagnosis Date   Arthritis    Asthma    Atrial fibrillation (HCC)    Cauda equina syndrome ([place]) 4/74/2595   Complication of anesthesia    COPD (chronic obstructive pulmonary disease) (HCC)    Depression    Diabetes mellitus without complication (HCC)    Type II   Diabetic  neuropathy, type II diabetes mellitus ([place]) 12/13/2019   Dysrhythmia    GERD (gastroesophageal reflux disease)    HTN (hypertension)    Hypercholesteremia    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[place]) 12/13/2019   Morbid obesity ([place]) 12/13/2019   Obstructive sleep apnea    PONV (postoperative nausea and vomiting)    "not the last few times"   RLS (restless legs syndrome)    Sleep apnea    Past Surgical History:  Procedure Laterality Date   ATRIAL FIBRILLATION ABLATION     x3   COLONOSCOPY  01/04/2013   Moderate predominantly sigmoid diverticulosis. Small internal hemorrhoids. Otherwise normal colonosopy.    LUMBAR WOUND DEBRIDEMENT N/A 08/09/2019   Procedure: LUMBAR WOUND DEBRIDEMENT Evacuation of  EPIDURAL HEMATOMA;  Surgeon: Newman Pies, MD;  Location: [facility];  Service: Neurosurgery;  Laterality: N/A;    Family History  Problem Relation Age of Onset   Colon cancer Paternal Aunt 70   Social History   Socioeconomic History   Marital status: Married    Spouse name: Not on file   Number of children: Not on file   Years of education: Not on file   Highest education level: Not on file  Occupational History   Not on file  Tobacco Use   Smoking status: Never Smoker   Smokeless tobacco: Never Used  Vaping Use   Vaping Use: Never used  Substance and Sexual Activity   Alcohol use: Not Currently   Drug use: Never   Sexual activity: Not Currently  Other Topics Concern   Not on file  Social History Narrative   Not on file   Social Determinants of Health   Financial Resource Strain: Not on file  Food Insecurity: Not on file  Transportation Needs: Not on file  Physical Activity: Not on file  Stress: Not on file  Social Connections: Not on file    Review of Systems  Constitutional: Positive for fatigue. Negative for chills and fever.  HENT: Negative for congestion, ear pain and sore throat.   Respiratory: Negative for  cough and shortness of breath.   Cardiovascular: Negative for chest pain and palpitations.  Gastrointestinal: Positive for nausea (since fall. ). Negative for abdominal pain, constipation, diarrhea and vomiting.  Endocrine: Negative for polydipsia, polyphagia and polyuria.  Genitourinary: Negative for difficulty urinating and dysuria.  Musculoskeletal: Positive for back pain (low back ) and myalgias. Negative for arthralgias.  Skin: Negative for rash.  Neurological: Positive for dizziness and headaches.  Psychiatric/Behavioral: Negative for dysphoric mood. The patient is not nervous/anxious.      Objective:  BP 132/90    Pulse (!) 106    Temp 97.6 F (36.4 C)    Resp 18  Ht 5\' 3"  (1.6 m)    Wt 193 lb (87.5 kg)    BMI 34.19 kg/m   BP/Weight 10/10/2020 07/28/2020 75/04/1637  Systolic BP 466 599 357  Diastolic BP 90 82 78  Wt. (Lbs) 193 190 188  BMI 34.19 33.66 33.3    Physical Exam Vitals reviewed.  Constitutional:      Appearance: Normal appearance.  Neck:     Vascular: No carotid bruit.  Cardiovascular:     Rate and Rhythm: Normal rate and regular rhythm.     Pulses: Normal pulses.     Heart sounds: Normal heart sounds.  Pulmonary:     Effort: Pulmonary effort is normal.     Breath sounds: Normal breath sounds.  Abdominal:     General: Bowel sounds are normal.     Palpations: Abdomen is soft.     Tenderness: There is no abdominal tenderness.  Neurological:     Mental Status: She is alert and oriented to person, place, and time.     Cranial Nerves: No cranial nerve deficit.     Motor: No weakness.     Coordination: Coordination normal.  Psychiatric:        Mood and Affect: Mood normal.        Behavior: Behavior normal.     Lab Results  Component Value Date   WBC 8.5 06/19/2020   HGB 12.8 06/19/2020   HCT 39.1 06/19/2020   PLT 332 06/19/2020   GLUCOSE 166 (H) 07/06/2020   CHOL 221 (H) 06/19/2020   TRIG 158 (H) 06/19/2020   HDL 43 06/19/2020   LDLCALC 149  (H) 06/19/2020   ALT 13 07/06/2020   AST 14 07/06/2020   NA 140 07/06/2020   K 4.6 07/06/2020   CL 102 07/06/2020   CREATININE 0.79 07/06/2020   BUN 18 07/06/2020   CO2 26 07/06/2020   INR 0.9 08/04/2019   HGBA1C 6.1 (H) 06/19/2020      Assessment & Plan:   1. Traumatic injury of head, initial encounter - CT Head Wo Contrast 2. Intractable acute post-traumatic headache - CT Head Wo Contrast 3. Dizziness - CT Head Wo Contrast 4. Nausea - CT Head Wo Contrast   CT scan of brain came back normal. Findings c/w a concussion. Continue tylenol Recommend rest.  Orders Placed This Encounter  Procedures   CT Head Wo Contrast     Follow-up: Return if symptoms worsen or fail to improve.  An After Visit Summary was printed and given to the patient.  Rochel Brome, MD [facility] [phone]

## 2020-10-12 ENCOUNTER — Other Ambulatory Visit: Payer: Self-pay | Admitting: Family Medicine

## 2020-10-20 LAB — HM MAMMOGRAPHY

## 2020-10-22 ENCOUNTER — Other Ambulatory Visit: Payer: Self-pay | Admitting: Family Medicine

## 2020-10-30 NOTE — Progress Notes (Signed)
cancelled

## 2020-10-31 ENCOUNTER — Ambulatory Visit (INDEPENDENT_AMBULATORY_CARE_PROVIDER_SITE_OTHER): Payer: Medicare Other | Admitting: Family Medicine

## 2020-10-31 DIAGNOSIS — I4891 Unspecified atrial fibrillation: Secondary | ICD-10-CM

## 2020-10-31 DIAGNOSIS — E785 Hyperlipidemia, unspecified: Secondary | ICD-10-CM

## 2020-10-31 DIAGNOSIS — E1140 Type 2 diabetes mellitus with diabetic neuropathy, unspecified: Secondary | ICD-10-CM

## 2020-10-31 DIAGNOSIS — Z794 Long term (current) use of insulin: Secondary | ICD-10-CM

## 2020-10-31 DIAGNOSIS — K219 Gastro-esophageal reflux disease without esophagitis: Secondary | ICD-10-CM

## 2020-10-31 DIAGNOSIS — I10 Essential (primary) hypertension: Secondary | ICD-10-CM

## 2020-10-31 DIAGNOSIS — E1169 Type 2 diabetes mellitus with other specified complication: Secondary | ICD-10-CM

## 2020-10-31 DIAGNOSIS — J449 Chronic obstructive pulmonary disease, unspecified: Secondary | ICD-10-CM

## 2020-11-04 ENCOUNTER — Other Ambulatory Visit: Payer: Self-pay | Admitting: Family Medicine

## 2020-11-04 DIAGNOSIS — G2581 Restless legs syndrome: Secondary | ICD-10-CM

## 2020-11-05 ENCOUNTER — Encounter: Payer: Self-pay | Admitting: Family Medicine

## 2020-12-08 LAB — HM DIABETES EYE EXAM

## 2020-12-14 ENCOUNTER — Other Ambulatory Visit: Payer: Self-pay | Admitting: Family Medicine

## 2021-01-06 ENCOUNTER — Other Ambulatory Visit: Payer: Self-pay | Admitting: Family Medicine

## 2021-01-06 DIAGNOSIS — G2581 Restless legs syndrome: Secondary | ICD-10-CM

## 2021-03-06 ENCOUNTER — Telehealth: Payer: Self-pay | Admitting: Pharmacist

## 2021-03-06 DIAGNOSIS — G720 Drug-induced myopathy: Secondary | ICD-10-CM

## 2021-03-06 DIAGNOSIS — T466X5A Adverse effect of antihyperlipidemic and antiarteriosclerotic drugs, initial encounter: Secondary | ICD-10-CM

## 2021-03-06 NOTE — Progress Notes (Addendum)
[facility])                                            [place] Team                                        Statin Quality Measure Assessment    03/06/2021  Caitlin [name] 1946/03/01 [id number]  Per review of chart and payor information, patient has a diagnosis of diabetes but is not currently filling a statin prescription.  This places patient into the SUPD (Statin Use In Patients with Diabetes) measure for CMS.    Spoke with patient via telephone. HIPAA identifiers were obtained. She said she is no longer taking rosuvastatin because it caused muscle aches.    Since she reported muscle aches and was already on alternative dosing, a statin exclusion code could be associated with her next provider visit and that would remove her from the measure.  Addendum-Patient has Rosuvastatin on her medication list, but it has not been filled since 07/28/20 -KJB  The 10-year ASCVD risk score Caitlin Bussing DC Jr., et al., 2013) is: 54.1%   Values used to calculate the score:     Age: 75 years     Sex: Female     Is Non-Hispanic African American: No     Diabetic: Yes     Tobacco smoker: No     Systolic Blood Pressure: 379 mmHg     Is BP treated: Yes     HDL Cholesterol: 43 mg/dL     Total Cholesterol: 221 mg/dL 06/19/2020     Component Value Date/Time   CHOL 221 (H) 06/19/2020 1136   TRIG 158 (H) 06/19/2020 1136   HDL 43 06/19/2020 1136   LDLCALC 149 (H) 06/19/2020 1136    Please consider ONE of the following recommendations:  Initiate high intensity statin Atorvastatin 40mg  once daily, #90, 3 refills   Rosuvastatin 20mg  once daily, #90, 3 refills    Initiate moderate intensity          statin with reduced frequency if prior          statin intolerance 1x weekly, #13, 3 refills   2x weekly, #26, 3 refills   3x weekly, #39, 3 refills    Code for past statin intolerance or  other exclusions (required annually)   Provider  Requirements:  Associate code during an office visit or telehealth encounter  Drug Induced Myopathy G72.0   Myopathy, unspecified G72.9   Myositis, unspecified M60.9   Rhabdomyolysis K24.09   Alcoholic fatty liver B35.3   Cirrhosis of liver K74.69   Prediabetes R73.03   PCOS E28.2   Toxic liver disease, unspecified K71.9   Adverse effect of antihyperlipidemic and antiarteriosclerotic drugs, initial encounter T46.6X5A   Plan: Route note to Dr. Tobie Poet prior to the patient's appointment with Dr. Tobie Poet in August.  Caitlin [name], PharmD, [place] [facility] [facility] Pharmacist [phone]

## 2021-03-10 ENCOUNTER — Other Ambulatory Visit: Payer: Self-pay | Admitting: Family Medicine

## 2021-03-15 ENCOUNTER — Other Ambulatory Visit: Payer: Self-pay | Admitting: Family Medicine

## 2021-03-19 ENCOUNTER — Other Ambulatory Visit: Payer: Self-pay | Admitting: Family Medicine

## 2021-03-19 DIAGNOSIS — G2581 Restless legs syndrome: Secondary | ICD-10-CM

## 2021-04-08 ENCOUNTER — Other Ambulatory Visit: Payer: Self-pay | Admitting: Family Medicine

## 2021-04-17 ENCOUNTER — Ambulatory Visit: Payer: Medicare Other | Admitting: Neurology

## 2021-05-07 ENCOUNTER — Ambulatory Visit (INDEPENDENT_AMBULATORY_CARE_PROVIDER_SITE_OTHER): Payer: Medicare Other | Admitting: Nurse Practitioner

## 2021-05-07 ENCOUNTER — Encounter: Payer: Self-pay | Admitting: Nurse Practitioner

## 2021-05-07 ENCOUNTER — Other Ambulatory Visit: Payer: Self-pay

## 2021-05-07 VITALS — BP 128/82 | HR 100 | Temp 97.7°F | Ht 63.0 in | Wt 197.0 lb

## 2021-05-07 DIAGNOSIS — R195 Other fecal abnormalities: Secondary | ICD-10-CM | POA: Diagnosis not present

## 2021-05-07 DIAGNOSIS — R300 Dysuria: Secondary | ICD-10-CM

## 2021-05-07 DIAGNOSIS — Z7901 Long term (current) use of anticoagulants: Secondary | ICD-10-CM

## 2021-05-07 DIAGNOSIS — N3000 Acute cystitis without hematuria: Secondary | ICD-10-CM

## 2021-05-07 DIAGNOSIS — K219 Gastro-esophageal reflux disease without esophagitis: Secondary | ICD-10-CM | POA: Diagnosis not present

## 2021-05-07 DIAGNOSIS — Z8719 Personal history of other diseases of the digestive system: Secondary | ICD-10-CM

## 2021-05-07 DIAGNOSIS — I482 Chronic atrial fibrillation, unspecified: Secondary | ICD-10-CM

## 2021-05-07 LAB — POCT URINALYSIS DIPSTICK
Blood, UA: NEGATIVE
Glucose, UA: NEGATIVE
Nitrite, UA: NEGATIVE
Protein, UA: POSITIVE — AB
Spec Grav, UA: >=1.03 — AB (ref 1.010–1.025)
Urobilinogen, UA: NEGATIVE E.U./dL — AB
pH, UA: 6 (ref 5.0–8.0)

## 2021-05-07 MED ORDER — NITROFURANTOIN MONOHYD MACRO 100 MG PO CAPS
100.0000 mg | ORAL_CAPSULE | Freq: Two times a day (BID) | ORAL | 0 refills | Status: DC
Start: 2021-05-07 — End: 2021-06-26

## 2021-05-07 MED ORDER — PANTOPRAZOLE SODIUM 40 MG PO TBEC: 40.0000 mg | DELAYED_RELEASE_TABLET | Freq: Every day | ORAL | 3 refills | Status: DC

## 2021-05-07 NOTE — Patient Instructions (Addendum)
Rest and push fluids, especially water Take Macrobid twice daily for 7 days Begin Protonix 40 mg daily for GERD Avoid foods that trigger GERD and lying down immediately for 3-4 hours after eating Return cards for guiac (blood in stool) We will call you cardiology referral and lab results Follow-up as needed   Urinary Tract Infection, Adult A urinary tract infection (UTI) is an infection of any part of the urinary tract. The urinary tract includes: The kidneys. The ureters. The bladder. The urethra. These organs make, store, and get rid of pee (urine) in the body. What are the causes? This infection is caused by germs (bacteria) in your genital area. These germs grow and cause swelling (inflammation) of your urinary tract. What increases the risk? The following factors may make you more likely to develop this condition: Using a small, thin tube (catheter) to drain pee. Not being able to control when you pee or poop (incontinence). Being female. If you are female, these things can increase the risk: Using these methods to prevent pregnancy: A medicine that kills sperm (spermicide). A device that blocks sperm (diaphragm). Having low levels of a female hormone (estrogen). Being pregnant. You are more likely to develop this condition if: You have genes that add to your risk. You are sexually active. You take antibiotic medicines. You have trouble peeing because of: A prostate that is bigger than normal, if you are female. A blockage in the part of your body that drains pee from the bladder. A kidney stone. A nerve condition that affects your bladder. Not getting enough to drink. Not peeing often enough. You have other conditions, such as: Diabetes. A weak disease-fighting system (immune system). Sickle cell disease. Gout. Injury of the spine. What are the signs or symptoms? Symptoms of this condition include: Needing to pee right away. Peeing small amounts often. Pain or  burning when peeing. Blood in the pee. Pee that smells bad or not like normal. Trouble peeing. Pee that is cloudy. Fluid coming from the vagina, if you are female. Pain in the belly or lower back. Other symptoms include: Vomiting. Not feeling hungry. Feeling mixed up (confused). This may be the first symptom in older adults. Being tired and grouchy (irritable). A fever. Watery poop (diarrhea). How is this treated? Taking antibiotic medicine. Taking other medicines. Drinking enough water. In some cases, you may need to see a specialist. Follow these instructions at home: Medicines Take over-the-counter and prescription medicines only as told by your doctor. If you were prescribed an antibiotic medicine, take it as told by your doctor. Do not stop taking it even if you start to feel better. General instructions Make sure you: Pee until your bladder is empty. Do not hold pee for a long time. Empty your bladder after sex. Wipe from front to back after peeing or pooping if you are a female. Use each tissue one time when you wipe. Drink enough fluid to keep your pee pale yellow. Keep all follow-up visits. Contact a doctor if: You do not get better after 1-2 days. Your symptoms go away and then come back. Get help right away if: You have very bad back pain. You have very bad pain in your lower belly. You have a fever. You have chills. You feeling like you will vomit or you vomit. Summary A urinary tract infection (UTI) is an infection of any part of the urinary tract. This condition is caused by germs in your genital area. There are many risk factors for  a UTI. Treatment includes antibiotic medicines. Drink enough fluid to keep your pee pale yellow. This information is not intended to replace advice given to you by your health care provider. Make sure you discuss any questions you have with your health care provider. Document Revised: 03/17/2020 Document Reviewed:  03/17/2020 Elsevier Patient Education  [place]. Gastroesophageal Reflux Disease, Adult Gastroesophageal reflux (GER) happens when acid from the stomach flows up into the tube that connects the mouth and the stomach (esophagus). Normally, food travels down the esophagus and stays in the stomach to be digested. With GER, food and stomach acid sometimes move back up into the esophagus. You may have a disease called gastroesophageal reflux disease (GERD) if the reflux: Happens often. Causes frequent or very bad symptoms. Causes problems such as damage to the esophagus. When this happens, the esophagus becomes sore and swollen. Over time, GERD can make small holes (ulcers) in the lining of the esophagus. What are the causes? This condition is caused by a problem with the muscle between the esophagus and the stomach. When this muscle is weak or not normal, it does not close properly to keep food and acid from coming back up from the stomach. The muscle can be weak because of: Tobacco use. Pregnancy. Having a certain type of hernia (hiatal hernia). Alcohol use. Certain foods and drinks, such as coffee, chocolate, onions, and peppermint. What increases the risk? Being overweight. Having a disease that affects your connective tissue. Taking NSAIDs, such a ibuprofen. What are the signs or symptoms? Heartburn. Difficult or painful swallowing. The feeling of having a lump in the throat. A bitter taste in the mouth. Bad breath. Having a lot of saliva. Having an upset or bloated stomach. Burping. Chest pain. Different conditions can cause chest pain. Make sure you see your doctor if you have chest pain. Shortness of breath or wheezing. A long-term cough or a cough at night. Wearing away of the surface of teeth (tooth enamel). Weight loss. How is this treated? Making changes to your diet. Taking medicine. Having surgery. Treatment will depend on how bad your symptoms are. Follow  these instructions at home: Eating and drinking  Follow a diet as told by your doctor. You may need to avoid foods and drinks such as: Coffee and tea, with or without caffeine. Drinks that contain alcohol. Energy drinks and sports drinks. Bubbly (carbonated) drinks or sodas. Chocolate and cocoa. Peppermint and mint flavorings. Garlic and onions. Horseradish. Spicy and acidic foods. These include peppers, chili powder, curry powder, vinegar, hot sauces, and BBQ sauce. Citrus fruit juices and citrus fruits, such as oranges, lemons, and limes. Tomato-based foods. These include red sauce, chili, salsa, and pizza with red sauce. Fried and fatty foods. These include donuts, french fries, potato chips, and high-fat dressings. High-fat meats. These include hot dogs, rib eye steak, sausage, ham, and bacon. High-fat dairy items, such as whole milk, butter, and cream cheese. Eat small meals often. Avoid eating large meals. Avoid drinking large amounts of liquid with your meals. Avoid eating meals during the 2-3 hours before bedtime. Avoid lying down right after you eat. Do not exercise right after you eat. Lifestyle  Do not smoke or use any products that contain nicotine or tobacco. If you need help quitting, ask your doctor. Try to lower your stress. If you need help doing this, ask your doctor. If you are overweight, lose an amount of weight that is healthy for you. Ask your doctor about a safe weight  loss goal. General instructions Pay attention to any changes in your symptoms. Take over-the-counter and prescription medicines only as told by your doctor. Do not take aspirin, ibuprofen, or other NSAIDs unless your doctor says it is okay. Wear loose clothes. Do not wear anything tight around your waist. Raise (elevate) the head of your bed about 6 inches (15 cm). You may need to use a wedge to do this. Avoid bending over if this makes your symptoms worse. Keep all follow-up visits. Contact  a doctor if: You have new symptoms. You lose weight and you do not know why. You have trouble swallowing or it hurts to swallow. You have wheezing or a cough that keeps happening. You have a hoarse voice. Your symptoms do not get better with treatment. Get help right away if: You have sudden pain in your arms, neck, jaw, teeth, or back. You suddenly feel sweaty, dizzy, or light-headed. You have chest pain or shortness of breath. You vomit and the vomit is green, yellow, or black, or it looks like blood or coffee grounds. You faint. Your poop (stool) is red, bloody, or black. You cannot swallow, drink, or eat. These symptoms may represent a serious problem that is an emergency. Do not wait to see if the symptoms will go away. Get medical help right away. Call your local emergency services (911 in the [place] Do not drive yourself to the hospital. Summary If a person has gastroesophageal reflux disease (GERD), food and stomach acid move back up into the esophagus and cause symptoms or problems such as damage to the esophagus. Treatment will depend on how bad your symptoms are. Follow a diet as told by your doctor. Take all medicines only as told by your doctor. This information is not intended to replace advice given to you by your health care provider. Make sure you discuss any questions you have with your health care provider. Document Revised: 02/14/2020 Document Reviewed: 02/14/2020 Elsevier Patient Education  2022 [place] for Gastroesophageal Reflux Disease, Adult When you have gastroesophageal reflux disease (GERD), the foods you eat and your eating habits are very important. Choosing the right foods can help ease your discomfort. Think about working with a food expert (dietitian) to help you make good choices. What are tips for following this plan? Reading food labels Look for foods that are low in saturated fat. Foods that may help with your symptoms  include: Foods that have less than 5% of daily value (DV) of fat. Foods that have 0 grams of trans fat. Cooking Do not fry your food. Cook your food by baking, steaming, grilling, or broiling. These are all methods that do not need a lot of fat for cooking. To add flavor, try to use herbs that are low in spice and acidity. Meal planning  Choose healthy foods that are low in fat, such as: Fruits and vegetables. Whole grains. Low-fat dairy products. Lean meats, fish, and poultry. Eat small meals often instead of eating 3 large meals each day. Eat your meals slowly in a place where you are relaxed. Avoid bending over or lying down until 2-3 hours after eating. Limit high-fat foods such as fatty meats or fried foods. Limit your intake of fatty foods, such as oils, butter, and shortening. Avoid the following as told by your doctor: Foods that cause symptoms. These may be different for different people. Keep a food diary to keep track of foods that cause symptoms. Alcohol. Drinking a lot of liquid with meals.  Eating meals during the 2-3 hours before bed. Lifestyle Stay at a healthy weight. Ask your doctor what weight is healthy for you. If you need to lose weight, work with your doctor to do so safely. Exercise for at least 30 minutes on 5 or more days each week, or as told by your doctor. Wear loose-fitting clothes. Do not smoke or use any products that contain nicotine or tobacco. If you need help quitting, ask your doctor. Sleep with the head of your bed higher than your feet. Use a wedge under the mattress or blocks under the bed frame to raise the head of the bed. Chew sugar-free gum after meals. What foods should eat? Eat a healthy, well-balanced diet of fruits, vegetables, whole grains, low-fat dairy products, lean meats, fish, and poultry. Each person is different. Foods that may cause symptoms in one person may not cause any symptoms in another person. Work with your doctor to find  foods that are safe for you. The items listed above may not be a complete list of what you can eat and drink. Contact a food expert for more options. What foods should I avoid? Limiting some of these foods may help in managing the symptoms of GERD. Everyone is different. Talk with a food expert or your doctor to help you find the exact foods to avoid, if any. Fruits Any fruits prepared with added fat. Any fruits that cause symptoms. For some people, this may include citrus fruits, such as oranges, grapefruit, pineapple, and lemons. Vegetables Deep-fried vegetables. [place] fries. Any vegetables prepared with added fat. Any vegetables that cause symptoms. For some people, this may include tomatoes and tomato products, chili peppers, onions and garlic, and horseradish. Grains Pastries or quick breads with added fat. Meats and other proteins High-fat meats, such as fatty beef or pork, hot dogs, ribs, ham, sausage, salami, and bacon. Fried meat or protein, including fried fish and fried chicken. Nuts and nut butters, in large amounts. Dairy Whole milk and chocolate milk. Sour cream. Cream. Ice cream. Cream cheese. Milkshakes. Fats and oils Butter. Margarine. Shortening. Ghee. Beverages Coffee and tea, with or without caffeine. Carbonated beverages. Sodas. Energy drinks. Fruit juice made with acidic fruits, such as orange or grapefruit. Tomato juice. Alcoholic drinks. Sweets and desserts Chocolate and cocoa. Donuts. Seasonings and condiments Pepper. Peppermint and spearmint. Added salt. Any condiments, herbs, or seasonings that cause symptoms. For some people, this may include curry, hot sauce, or vinegar-based salad dressings. The items listed above may not be a complete list of what you should not eat and drink. Contact a food expert for more options. Questions to ask your doctor Diet and lifestyle changes are often the first steps that are taken to manage symptoms of GERD. If diet and lifestyle  changes do not help, talk with your doctor about taking medicines. Where to find more information [employer]: aboutgerd.org Summary When you have GERD, food and lifestyle choices are very important in easing your symptoms. Eat small meals often instead of 3 large meals a day. Eat your meals slowly and in a place where you are relaxed. Avoid bending over or lying down until 2-3 hours after eating. Limit high-fat foods such as fatty meats or fried foods. This information is not intended to replace advice given to you by your health care provider. Make sure you discuss any questions you have with your health care provider. Document Revised: 02/14/2020 Document Reviewed: 02/14/2020 Elsevier Patient Education  [place].

## 2021-05-07 NOTE — Progress Notes (Signed)
Acute Office Visit  Subjective:    Patient ID: Caitlin [name], female    DOB: 1946/03/16, 75 y.o.   MRN: [id number]  Chief Complaint  Patient presents with   Dysuria    HPI  Patient is in today for dysuria and dark tarry stools. She tells me onset of symptoms was approximately 4-weeks ago. She denies any previous treatments at home. She tells me she is prescribed Xarelto 20 mg daily due to chronic a-fib. States she is currently out of Xarelto but has not been able to attain an appointment with her Cardiologist, Dr Caitlin [name]. She has declined samples/refill of Xarelto today in office. She has requested a referral to a local cardiologist to manage her cardiac medical problems without commuting to [place]. [name]'s last colonoscopy was in 2014 with Dr Caitlin [name], revealed diverticulitis and internal hemorrhoids. She tells me that she has treated increased GERD symptoms with OTC Mylanta PRN. Admits to eating spicy foods late at night and lying down immediately afterwards. States  Past Medical History:  Diagnosis Date   Arthritis    Asthma    Atrial fibrillation ([place])    Cauda equina syndrome ([place]) 1/88/4166   Complication of anesthesia    COPD (chronic obstructive pulmonary disease) (HCC)    Depression    Diabetes mellitus without complication (HCC)    Type II   Diabetic neuropathy, type II diabetes mellitus ([place]) 12/13/2019   Dysrhythmia    GERD (gastroesophageal reflux disease)    HTN (hypertension)    Hypercholesteremia    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[place]) 12/13/2019   Morbid obesity ([place]) 12/13/2019   Obstructive sleep apnea    PONV (postoperative nausea and vomiting)    "not the last few times"   RLS (restless legs syndrome)    Sleep apnea     Past Surgical History:  Procedure Laterality Date   ATRIAL FIBRILLATION ABLATION     x3   COLONOSCOPY  01/04/2013   Moderate predominantly sigmoid diverticulosis. Small internal hemorrhoids. Otherwise  normal colonosopy.    LUMBAR WOUND DEBRIDEMENT N/A 08/09/2019   Procedure: LUMBAR WOUND DEBRIDEMENT Evacuation of  EPIDURAL HEMATOMA;  Surgeon: Caitlin Pies, MD;  Location: [place];  Service: Neurosurgery;  Laterality: N/A;    Family History  Problem Relation Age of Onset   Colon cancer Paternal Aunt 42    Social History   Socioeconomic History   Marital status: Married    Spouse name: Not on file   Number of children: Not on file   Years of education: Not on file   Highest education level: Not on file  Occupational History   Not on file  Tobacco Use   Smoking status: Never   Smokeless tobacco: Never  Vaping Use   Vaping Use: Never used  Substance and Sexual Activity   Alcohol use: Not Currently   Drug use: Never   Sexual activity: Not Currently  Other Topics Concern   Not on file  Social History Narrative   Not on file   Social Determinants of Health   Financial Resource Strain: Not on file  Food Insecurity: Not on file  Transportation Needs: Not on file  Physical Activity: Not on file  Stress: Not on file  Social Connections: Not on file  Intimate Partner Violence: Not on file    Outpatient Medications Prior to Visit  Medication Sig Dispense Refill   albuterol (PROVENTIL) (2.5 MG/3ML) 0.083% nebulizer solution Take 2.5 mg by nebulization every 6 (six) hours as needed  for wheezing or shortness of breath.      busPIRone (BUSPAR) 10 MG tablet TAKE 1 TABLET BY MOUTH TWICE A DAY 60 tablet 2   calcium carbonate (TUMS - DOSED IN MG ELEMENTAL CALCIUM) 500 MG chewable tablet Chew 2 tablets by mouth daily as needed for indigestion.      Calcium-Vitamin D-Vitamin K 500-100-40 MG-UNT-MCG CHEW Chew 1 tablet by mouth at bedtime.     cetirizine (ZYRTEC) 10 MG tablet Take 1 tablet (10 mg total) by mouth daily. 90 tablet 1   Continuous Blood Gluc Receiver (FREESTYLE LIBRE 14 DAY READER) DEVI USE TO CHECK BLOOD SUGARS DAILY     Continuous Blood Gluc Sensor (FREESTYLE LIBRE 14  DAY SENSOR) MISC SMARTSIG:1 Topical Every 2 Weeks     diltiazem (CARDIZEM CD) 240 MG 24 hr capsule Take 240 mg by mouth daily.     diphenhydrAMINE (BENADRYL) 25 MG tablet Take 25 mg by mouth every 6 (six) hours as needed.     DULoxetine (CYMBALTA) 60 MG capsule TAKE 1 CAPSULE BY MOUTH TWICE A DAY 180 capsule 1   fexofenadine (ALLEGRA) 180 MG tablet Take 180 mg by mouth daily as needed for allergies or rhinitis.     gabapentin (NEURONTIN) 600 MG tablet Take 1 tablet (600 mg total) by mouth at bedtime. 90 tablet 0   Insulin Degludec (TRESIBA FLEXTOUCH) 200 UNIT/ML SOPN Inject 70 Units into the skin every evening.      Insulin Pen Needle 31G X 8 MM MISC Inject 1 Units into the skin daily.     Insulin Pen Needle 32G X 4 MM MISC 1 each by Does not apply route in the morning and at bedtime. 100 each 2   lisinopril (ZESTRIL) 20 MG tablet TAKE 1 TABLET BY MOUTH EVERY DAY 90 tablet 1   lisinopril (ZESTRIL) 40 MG tablet Take 1 tablet (40 mg total) by mouth daily. 90 tablet 1   Misc Natural Products (LUTEIN 20) CAPS Take 20 mg by mouth every evening.     omeprazole (PRILOSEC) 40 MG capsule TAKE 1 CAPSULE(S) BY MOUTH DAILY IN THE MORNING 90 capsule 1   Potassium Gluconate 550 (90 K) MG TABS Take 550 mg by mouth daily as needed (cramps).     rivaroxaban (XARELTO) 20 MG TABS tablet Take 20 mg by mouth daily with supper.     rOPINIRole (REQUIP) 3 MG tablet TAKE 1 TABLET BY MOUTH TWICE A DAY 180 tablet 0   rosuvastatin (CRESTOR) 10 MG tablet TAKE 1 TABLET (10 MG TOTAL) BY MOUTH 2 (TWO) TIMES A WEEK. 24 tablet 0   Semaglutide,0.25 or 0.5MG /DOS, 2 MG/1.5ML SOPN Inject 0.5 mg into the skin every Monday.      UPNEEQ 0.1 % SOLN Apply 1 drop to eye daily.     VITAMIN D-VITAMIN K PO Take 1 tablet by mouth every evening.     diltiazem (CARDIZEM CD) 300 MG 24 hr capsule TAKE 1 CAPSULE BY MOUTH EVERY DAY 90 capsule 0   No facility-administered medications prior to visit.    Allergies  Allergen Reactions   Codeine  Nausea Only    Review of Systems  Constitutional:  Negative for appetite change, fatigue and fever.  HENT:  Negative for congestion, ear pain, sinus pressure and sore throat.   Eyes:  Negative for pain.  Respiratory:  Negative for cough, chest tightness, shortness of breath and wheezing.   Cardiovascular:  Negative for chest pain and palpitations.  Gastrointestinal:  Positive for constipation. Negative for  abdominal pain, diarrhea, nausea and vomiting.       Dark tarry stools; increased GERD symptoms  Endocrine: Negative.   Genitourinary:  Positive for dysuria, flank pain and urgency. Negative for hematuria.  Musculoskeletal:  Negative for arthralgias, back pain, joint swelling and myalgias.  Skin:  Negative for rash.  Allergic/Immunologic: Negative.   Neurological:  Negative for dizziness, weakness and headaches.  Hematological: Negative.   Psychiatric/Behavioral:  Negative for dysphoric mood. The patient is not nervous/anxious.       Objective:    Physical Exam Vitals reviewed.  Constitutional:      Appearance: Normal appearance.  HENT:     Nose: Nose normal.     Mouth/Throat:     Mouth: Mucous membranes are moist.  Cardiovascular:     Rate and Rhythm: Normal rate. Rhythm irregular.     Pulses: Normal pulses.     Heart sounds: Normal heart sounds.  Pulmonary:     Effort: Pulmonary effort is normal.     Breath sounds: Normal breath sounds.  Abdominal:     General: Bowel sounds are normal.     Palpations: Abdomen is soft.  Musculoskeletal:     Cervical back: Normal range of motion.  Skin:    General: Skin is warm and dry.     Capillary Refill: Capillary refill takes less than 2 seconds.  Neurological:     General: No focal deficit present.     Mental Status: She is alert and oriented to person, place, and time.  Psychiatric:        Mood and Affect: Mood normal.        Behavior: Behavior normal.    BP 128/82 (BP Location: Left Arm, Patient Position: Sitting)    Pulse 100   Temp 97.7 F (36.5 C) (Temporal)   Ht 5\' 3"  (1.6 m)   Wt 197 lb (89.4 kg)   SpO2 97%   BMI 34.90 kg/m  Wt Readings from Last 3 Encounters:  05/07/21 197 lb (89.4 kg)  10/10/20 193 lb (87.5 kg)  07/28/20 190 lb (86.2 kg)    Health Maintenance Due  Topic Date Due   Hepatitis C Screening  Never done   Zoster Vaccines- Shingrix (1 of 2) Never done   COVID-19 Vaccine (4 - Booster for Pfizer series) 12/02/2020   HEMOGLOBIN A1C  12/17/2020   INFLUENZA VACCINE  03/19/2021        Lab Results  Component Value Date   WBC 8.5 06/19/2020   HGB 12.8 06/19/2020   HCT 39.1 06/19/2020   MCV 83 06/19/2020   PLT 332 06/19/2020   Lab Results  Component Value Date   NA 140 07/06/2020   K 4.6 07/06/2020   CO2 26 07/06/2020   GLUCOSE 166 (H) 07/06/2020   BUN 18 07/06/2020   CREATININE 0.79 07/06/2020   BILITOT 0.2 07/06/2020   ALKPHOS 109 07/06/2020   AST 14 07/06/2020   ALT 13 07/06/2020   PROT 6.8 07/06/2020   ALBUMIN 4.1 07/06/2020   CALCIUM 9.8 07/06/2020   ANIONGAP 14 08/10/2019   Lab Results  Component Value Date   CHOL 221 (H) 06/19/2020   Lab Results  Component Value Date   HDL 43 06/19/2020   Lab Results  Component Value Date   LDLCALC 149 (H) 06/19/2020   Lab Results  Component Value Date   TRIG 158 (H) 06/19/2020   No results found for: [facility] Lab Results  Component Value Date   HGBA1C 6.1 (H) 06/19/2020  Assessment & Plan:   1. Acute cystitis without hematuria - Urine Culture - nitrofurantoin, macrocrystal-monohydrate, (MACROBID) 100 MG capsule; Take 1 capsule (100 mg total) by mouth 2 (two) times daily.  Dispense: 14 capsule; Refill: 0  2. Dysuria - POCT urinalysis dipstick  3. Dark stools - Fecal Occult Blood, Guaiac([place]) - CBC with Differential/Platelet - Comprehensive metabolic panel  4. Gastroesophageal reflux disease without esophagitis - CBC with Differential/Platelet - Comprehensive metabolic panel -  pantoprazole (PROTONIX) 40 MG tablet; Take 1 tablet (40 mg total) by mouth daily.  Dispense: 30 tablet; Refill: 3  5. Long term current use of anticoagulant - CBC with Differential/Platelet - Ambulatory referral to Cardiology  6. History of diverticulosis - CBC with Differential/Platelet - Comprehensive metabolic panel  7. Chronic atrial fibrillation (HCC) - Ambulatory referral to Cardiology    Rest and push fluids, especially water Take Macrobid twice daily for 7 days Begin Protonix 40 mg daily for GERD Avoid foods that trigger GERD and lying down immediately for 3-4 hours after eating Return cards for guiac (blood in stool) We will call you cardiology referral and lab results Follow-up as needed   Follow-up: as needed  I,Lauren M Auman,acting as a scribe for [employer], NP.,have documented all relevant documentation on the behalf of Rip Harbour, NP,as directed by  Rip Harbour, NP while in the presence of Rip Harbour, NP.   I, Rip Harbour, NP, have reviewed all documentation for this visit. The documentation on 05/07/21 for the exam, diagnosis, procedures, and orders are all accurate and complete.    Signed, Jerrell Belfast, DNP 05/07/21 at 10:00 PM

## 2021-05-08 LAB — COMPREHENSIVE METABOLIC PANEL
ALT: 14 IU/L (ref 0–32)
AST: 15 IU/L (ref 0–40)
Albumin/Globulin Ratio: 1.7 (ref 1.2–2.2)
Albumin: 4.5 g/dL (ref 3.7–4.7)
Alkaline Phosphatase: 97 IU/L (ref 44–121)
BUN/Creatinine Ratio: 18 (ref 12–28)
BUN: 15 mg/dL (ref 8–27)
Bilirubin Total: <0.2 mg/dL (ref 0.0–1.2)
CO2: 24 mmol/L (ref 20–29)
Calcium: 9.9 mg/dL (ref 8.7–10.3)
Chloride: 104 mmol/L (ref 96–106)
Creatinine, Ser: 0.82 mg/dL (ref 0.57–1.00)
Globulin, Total: 2.7 g/dL (ref 1.5–4.5)
Glucose: 108 mg/dL — ABNORMAL HIGH (ref 65–99)
Potassium: 5.3 mmol/L — ABNORMAL HIGH (ref 3.5–5.2)
Sodium: 142 mmol/L (ref 134–144)
Total Protein: 7.2 g/dL (ref 6.0–8.5)
eGFR: 75 mL/min/{1.73_m2} (ref >59)

## 2021-05-08 LAB — CBC WITH DIFFERENTIAL/PLATELET
Basophils Absolute: 0.1 10*3/uL (ref 0.0–0.2)
Basos: 1 %
EOS (ABSOLUTE): 0.3 10*3/uL (ref 0.0–0.4)
Eos: 3 %
Hematocrit: 37.4 % (ref 34.0–46.6)
Hemoglobin: 12.1 g/dL (ref 11.1–15.9)
Immature Grans (Abs): 0.1 10*3/uL (ref 0.0–0.1)
Immature Granulocytes: 1 %
Lymphocytes Absolute: 2.6 10*3/uL (ref 0.7–3.1)
Lymphs: 26 %
MCH: 26 pg — ABNORMAL LOW (ref 26.6–33.0)
MCHC: 32.4 g/dL (ref 31.5–35.7)
MCV: 80 fL (ref 79–97)
Monocytes Absolute: 0.9 10*3/uL (ref 0.1–0.9)
Monocytes: 9 %
Neutrophils Absolute: 6 10*3/uL (ref 1.4–7.0)
Neutrophils: 60 %
Platelets: 341 10*3/uL (ref 150–450)
RBC: 4.66 x10E6/uL (ref 3.77–5.28)
RDW: 15.7 % — ABNORMAL HIGH (ref 11.7–15.4)
WBC: 9.9 10*3/uL (ref 3.4–10.8)

## 2021-05-10 LAB — URINE CULTURE

## 2021-05-18 ENCOUNTER — Encounter: Payer: Self-pay | Admitting: Nurse Practitioner

## 2021-05-29 ENCOUNTER — Ambulatory Visit (INDEPENDENT_AMBULATORY_CARE_PROVIDER_SITE_OTHER): Payer: Medicare Other

## 2021-05-29 DIAGNOSIS — R195 Other fecal abnormalities: Secondary | ICD-10-CM

## 2021-05-29 LAB — POC HEMOCCULT BLD/STL (HOME/3-CARD/SCREEN)
Card #2 Fecal Occult Blod, POC: NEGATIVE
Card #3 Fecal Occult Blood, POC: NEGATIVE
Fecal Occult Blood, POC: NEGATIVE

## 2021-06-15 ENCOUNTER — Other Ambulatory Visit: Payer: Self-pay | Admitting: Family Medicine

## 2021-06-15 ENCOUNTER — Other Ambulatory Visit: Payer: Self-pay | Admitting: Nurse Practitioner

## 2021-06-15 DIAGNOSIS — G2581 Restless legs syndrome: Secondary | ICD-10-CM

## 2021-06-15 DIAGNOSIS — K219 Gastro-esophageal reflux disease without esophagitis: Secondary | ICD-10-CM

## 2021-06-18 ENCOUNTER — Telehealth: Payer: Self-pay

## 2021-06-18 NOTE — Chronic Care Management (AMB) (Signed)
Updated [facility] Adherence report with latest BP readings. Needs AWV this year.Sent a message to the provider to get an updated AWV scheduled for pt.  Elray Mcgregor, [address] Pharmacist Assistant  [phone]

## 2021-06-25 NOTE — Progress Notes (Deleted)
Cardiology Office Note:    Date:  06/26/2021   ID:  Caitlin [name], DOB 10-Aug-1946, MRN [id number]  PCP:  Rochel Brome, MD  Cardiologist:  Shirlee More, MD   Referring MD: Rip Harbour, NP  ASSESSMENT:    1. Dizziness   2. Paroxysmal atrial fibrillation (HCC)   3. Chronic anticoagulation   4. Hypertensive heart disease with congestive heart failure, unspecified heart failure type ([place])   5. Chronic obstructive pulmonary disease, unspecified COPD type ([place])    PLAN:    In order of problems listed above:  ***  Next appointment   Medication Adjustments/Labs and Tests Ordered: Current medicines are reviewed at length with the patient today.  Concerns regarding medicines are outlined above.  No orders of the defined types were placed in this encounter.  No orders of the defined types were placed in this encounter.    No chief complaint on file. ***  History of Present Illness:    Caitlin [name] is a 75 y.o. female who is being seen today for the evaluation of dizziness at the request of Rip Harbour, NP.   She has a history of paroxysmal atrial fibrillation and previous atrial flutter ablation in 2014 and atrial fibrillation ablation 2017 hypertension chronic anticoagulation COPD and obstructive sleep apnea.  She was last seen [facility] cardiology 03/27/2021.  Follow-up with electrophysiology with increasing episodes of palpitation.  Chart review shows an echocardiogram 12/13/2019 left ventricle normal size mild LVH EF 60 to 65% left and right atria are normal in size and there is mild mitral annular calcification.  She has had several event monitors performed last 03/29/2021 unfortunately results cannot be seen in Care Everywhere. CT of the chest 06/16/2021 showed moderate coronary artery calcification aortic atherosclerosis small hiatal hernia and no evidence of pulmonary embolism  Past Medical History:  Diagnosis Date   Arthritis    Asthma    Atrial  fibrillation (HCC)    Cauda equina syndrome ([place]) 9/62/2297   Complication of anesthesia    COPD (chronic obstructive pulmonary disease) ([place])    Depression    Diabetes mellitus without complication (HCC)    Type II   Diabetic neuropathy, type II diabetes mellitus ([place]) 12/13/2019   Dysrhythmia    GERD (gastroesophageal reflux disease)    HTN (hypertension)    Hypercholesteremia    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[place]) 12/13/2019   Morbid obesity ([place]) 12/13/2019   Obstructive sleep apnea    PONV (postoperative nausea and vomiting)    "not the last few times"   RLS (restless legs syndrome)    Sleep apnea     Past Surgical History:  Procedure Laterality Date   ATRIAL FIBRILLATION ABLATION     x3   COLONOSCOPY  01/04/2013   Moderate predominantly sigmoid diverticulosis. Small internal hemorrhoids. Otherwise normal colonosopy.    LUMBAR WOUND DEBRIDEMENT N/A 08/09/2019   Procedure: LUMBAR WOUND DEBRIDEMENT Evacuation of  EPIDURAL HEMATOMA;  Surgeon: Newman Pies, MD;  Location: [place];  Service: Neurosurgery;  Laterality: N/A;    Current Medications: No outpatient medications have been marked as taking for the 06/26/21 encounter (Appointment) with Richardo Priest, MD.     Allergies:   Codeine   Social History   Socioeconomic History   Marital status: Married    Spouse name: Not on file   Number of children: Not on file   Years of education: Not on file   Highest education level: Not on file  Occupational History   Not on file  Tobacco Use   Smoking status: Never   Smokeless tobacco: Never  Vaping Use   Vaping Use: Never used  Substance and Sexual Activity   Alcohol use: Not Currently   Drug use: Never   Sexual activity: Not Currently  Other Topics Concern   Not on file  Social History Narrative   Not on file   Social Determinants of Health   Financial Resource Strain: Not on file  Food Insecurity: Not on file  Transportation Needs:  Not on file  Physical Activity: Not on file  Stress: Not on file  Social Connections: Not on file     Family History: The patient's ***family history includes Colon cancer (age of onset: 23) in her paternal aunt.  ROS:   ROS Please see the history of present illness.    *** All other systems reviewed and are negative.  EKGs/Labs/Other Studies Reviewed:    The following studies were reviewed today: ***  EKG:  EKG is *** ordered today.  The ekg ordered today is personally reviewed and demonstrates ***  Recent Labs: 05/07/2021: ALT 14; BUN 15; Creatinine, Ser 0.82; Hemoglobin 12.1; Platelets 341; Potassium 5.3; Sodium 142  Recent Lipid Panel    Component Value Date/Time   CHOL 221 (H) 06/19/2020 1136   TRIG 158 (H) 06/19/2020 1136   HDL 43 06/19/2020 1136   LDLCALC 149 (H) 06/19/2020 1136    Physical Exam:    VS:  There were no vitals taken for this visit.    Wt Readings from Last 3 Encounters:  06/26/21 197 lb (89.4 kg)  05/07/21 197 lb (89.4 kg)  10/10/20 193 lb (87.5 kg)     GEN: *** Well nourished, well developed in no acute distress HEENT: Normal NECK: No JVD; No carotid bruits LYMPHATICS: No lymphadenopathy CARDIAC: ***RRR, no murmurs, rubs, gallops RESPIRATORY:  Clear to auscultation without rales, wheezing or rhonchi  ABDOMEN: Soft, non-tender, non-distended MUSCULOSKELETAL:  No edema; No deformity  SKIN: Warm and dry NEUROLOGIC:  Alert and oriented x 3 PSYCHIATRIC:  Normal affect     Signed, Shirlee More, MD  06/26/2021 1:06 PM    [facility]

## 2021-06-26 ENCOUNTER — Ambulatory Visit: Payer: Medicare Other | Admitting: Cardiology

## 2021-06-26 ENCOUNTER — Other Ambulatory Visit: Payer: Self-pay

## 2021-06-26 ENCOUNTER — Ambulatory Visit (INDEPENDENT_AMBULATORY_CARE_PROVIDER_SITE_OTHER): Payer: Medicare Other | Admitting: Nurse Practitioner

## 2021-06-26 ENCOUNTER — Encounter: Payer: Self-pay | Admitting: Nurse Practitioner

## 2021-06-26 VITALS — BP 122/80 | HR 99 | Temp 96.5°F | Ht 63.0 in | Wt 197.0 lb

## 2021-06-26 DIAGNOSIS — J441 Chronic obstructive pulmonary disease with (acute) exacerbation: Secondary | ICD-10-CM | POA: Diagnosis not present

## 2021-06-26 DIAGNOSIS — I10 Essential (primary) hypertension: Secondary | ICD-10-CM | POA: Diagnosis not present

## 2021-06-26 DIAGNOSIS — Z23 Encounter for immunization: Secondary | ICD-10-CM

## 2021-06-26 DIAGNOSIS — I480 Paroxysmal atrial fibrillation: Secondary | ICD-10-CM

## 2021-06-26 DIAGNOSIS — R350 Frequency of micturition: Secondary | ICD-10-CM | POA: Diagnosis not present

## 2021-06-26 DIAGNOSIS — G2581 Restless legs syndrome: Secondary | ICD-10-CM

## 2021-06-26 DIAGNOSIS — N39490 Overflow incontinence: Secondary | ICD-10-CM

## 2021-06-26 LAB — POCT URINALYSIS DIPSTICK
Bilirubin, UA: NEGATIVE
Blood, UA: NEGATIVE
Glucose, UA: NEGATIVE
Ketones, UA: NEGATIVE
Nitrite, UA: NEGATIVE
Protein, UA: POSITIVE — AB
Spec Grav, UA: >=1.03 — AB (ref 1.010–1.025)
Urobilinogen, UA: 0.2 E.U./dL
pH, UA: 5.5 (ref 5.0–8.0)

## 2021-06-26 MED ORDER — OXYBUTYNIN CHLORIDE ER 10 MG PO TB24: 10.0000 mg | ORAL_TABLET | Freq: Every day | ORAL | 0 refills | Status: DC

## 2021-06-26 MED ORDER — ALBUTEROL SULFATE (2.5 MG/3ML) 0.083% IN NEBU: 2.5000 mg | INHALATION_SOLUTION | Freq: Four times a day (QID) | RESPIRATORY_TRACT | 0 refills | Status: DC | PRN

## 2021-06-26 MED ORDER — BUDESONIDE-FORMOTEROL FUMARATE 80-4.5 MCG/ACT IN AERO: 2.0000 | INHALATION_SPRAY | Freq: Two times a day (BID) | RESPIRATORY_TRACT | 3 refills | Status: DC

## 2021-06-26 MED ORDER — LISINOPRIL 40 MG PO TABS: 40.0000 mg | ORAL_TABLET | Freq: Every day | ORAL | 1 refills | Status: DC

## 2021-06-26 MED ORDER — GABAPENTIN 600 MG PO TABS: 600.0000 mg | ORAL_TABLET | Freq: Every day | ORAL | 1 refills | Status: DC

## 2021-06-26 NOTE — Progress Notes (Signed)
Subjective:  Patient ID: Caitlin [name], female    DOB: [birth year]  Age: 75 y.o. MRN: [id number]  Chief Complaint  Patient presents with   Hospitalization Follow-up    HPI Caitlin [name] is a 75 year old Caucasian female that presents for ED follow-up. She was seen at [facility] on 06/16/21. States she was short-of-breath and thought she may have been experiencing an MI. She was treated for COPD exacerbation. Treatment included IV Decadron and nebulizer treatments. She denies being a previous cigarette smoker. 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 Denies previous PFT or evaluation by pulmonologist. Denies use of maintenance inhaler. States she has Albuterol inhaler PRN. She has requested a nebulizer machine. Caitlin [name] has been prescribed Xarelto for chronic a-fib.States she has stopped taking Xarelto pending cardiology appt today. She tells me that she has been unable to obtain an appt with previous cardiologist in [facility]. She is scheduled to see Dr Bettina Gavia today in [place] for evaluation.   Caitlin [name] tells me that she has been experiencing urinary frequency and incontinence. States she was treated for mild UTI approximately 6-weeks ago. Symptoms subsided briefly but she is now experiencing nocturia, incontinence, and urgency daily. States this has become a source of embarrassment and has prevented her from doing activities that she enjoys such as shopping. States urinary symptoms are interfering with her quality of life.   Current Outpatient Medications on File Prior to Visit  Medication Sig Dispense Refill   albuterol (PROVENTIL) (2.5 MG/3ML) 0.083% nebulizer solution Take 2.5 mg by nebulization every 6 (six) hours as needed for wheezing or shortness of breath.      busPIRone (BUSPAR) 10 MG tablet TAKE 1 TABLET BY MOUTH TWICE A DAY 60 tablet 2   calcium carbonate (TUMS - DOSED IN MG ELEMENTAL CALCIUM) 500 MG chewable tablet Chew 2  tablets by mouth daily as needed for indigestion.      Calcium-Vitamin D-Vitamin K 500-100-40 MG-UNT-MCG CHEW Chew 1 tablet by mouth at bedtime.     cetirizine (ZYRTEC) 10 MG tablet Take 1 tablet (10 mg total) by mouth daily. 90 tablet 1   Continuous Blood Gluc Receiver (FREESTYLE LIBRE 14 DAY READER) DEVI USE TO CHECK BLOOD SUGARS DAILY     Continuous Blood Gluc Sensor (FREESTYLE LIBRE 14 DAY SENSOR) MISC SMARTSIG:1 Topical Every 2 Weeks     diltiazem (CARDIZEM CD) 240 MG 24 hr capsule Take 240 mg by mouth daily.     diphenhydrAMINE (BENADRYL) 25 MG tablet Take 25 mg by mouth every 6 (six) hours as needed.     DULoxetine (CYMBALTA) 60 MG capsule TAKE 1 CAPSULE BY MOUTH TWICE A DAY 180 capsule 1   fexofenadine (ALLEGRA) 180 MG tablet Take 180 mg by mouth daily as needed for allergies or rhinitis.     gabapentin (NEURONTIN) 600 MG tablet Take 1 tablet (600 mg total) by mouth at bedtime. 90 tablet 0   Insulin Degludec (TRESIBA FLEXTOUCH) 200 UNIT/ML SOPN Inject 70 Units into the skin every evening.      Insulin Pen Needle 31G X 8 MM MISC Inject 1 Units into the skin daily.     Insulin Pen Needle 32G X 4 MM MISC 1 each by Does not apply route in the morning and at bedtime. 100 each 2   lisinopril (ZESTRIL) 20 MG tablet TAKE 1 TABLET BY MOUTH EVERY DAY 90 tablet 1   lisinopril (ZESTRIL) 40 MG tablet Take 1 tablet (40 mg total)  by mouth daily. 90 tablet 1   Misc Natural Products (LUTEIN 20) CAPS Take 20 mg by mouth every evening.     nitrofurantoin, macrocrystal-monohydrate, (MACROBID) 100 MG capsule Take 1 capsule (100 mg total) by mouth 2 (two) times daily. 14 capsule 0   pantoprazole (PROTONIX) 40 MG tablet TAKE 1 TABLET BY MOUTH EVERY DAY 90 tablet 1   Potassium Gluconate 550 (90 K) MG TABS Take 550 mg by mouth daily as needed (cramps).     rivaroxaban (XARELTO) 20 MG TABS tablet Take 20 mg by mouth daily with supper.     rOPINIRole (REQUIP) 3 MG tablet TAKE 1 TABLET BY MOUTH TWICE A DAY 180  tablet 0   rosuvastatin (CRESTOR) 10 MG tablet TAKE 1 TABLET (10 MG TOTAL) BY MOUTH 2 (TWO) TIMES A WEEK. 24 tablet 0   Semaglutide,0.25 or 0.5MG /DOS, 2 MG/1.5ML SOPN Inject 0.5 mg into the skin every Monday.      UPNEEQ 0.1 % SOLN Apply 1 drop to eye daily.     VITAMIN D-VITAMIN K PO Take 1 tablet by mouth every evening.     No current facility-administered medications on file prior to visit.   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 Small internal hemorrhoids. Otherwise normal colonosopy.    LUMBAR WOUND DEBRIDEMENT N/A 08/09/2019   Procedure: LUMBAR WOUND DEBRIDEMENT Evacuation of  EPIDURAL HEMATOMA;  Surgeon: Newman Pies, MD;  Location: [place];  Service: Neurosurgery;  Laterality: N/A;    Family History  Problem Relation Age of Onset   Colon cancer Paternal Aunt 57   Social History   Socioeconomic History   Marital status: Married    Spouse name: Not on file   Number of children: Not on file   Years of education: Not on file   Highest education level: Not on file  Occupational History   Not on file  Tobacco Use   Smoking status: Never    Smokeless tobacco: Never  Vaping Use   Vaping Use: Never used  Substance and Sexual Activity   Alcohol use: Not Currently   Drug use: Never   Sexual activity: Not Currently  Other Topics Concern   Not on file  Social History Narrative   Not on file   Social Determinants of Health   Financial Resource Strain: Not on file  Food Insecurity: Not on file  Transportation Needs: Not on file  Physical Activity: Not on file  Stress: Not on file  Social Connections: Not on file    Review of Systems  Constitutional:  Negative for  chills, fatigue and fever.  HENT:  Negative for congestion, ear pain, rhinorrhea and sore throat.   Respiratory:  Positive for cough and shortness of breath.   Cardiovascular:  Negative for chest pain.  Gastrointestinal:  Negative for abdominal pain, constipation, diarrhea, nausea and vomiting.  Endocrine: Positive for polyuria.  Genitourinary:  Positive for frequency and urgency. Negative for dysuria.       Urinary incontinence  Musculoskeletal:  Positive for arthralgias and back pain. Negative for myalgias.  Skin: Negative.   Neurological:  Negative for dizziness and headaches.  Hematological: Negative.   Psychiatric/Behavioral:  Negative for dysphoric mood. The patient is not nervous/anxious.     Objective:  BP 122/80   Pulse 99   Temp (!) 96.5 F (35.8 C)   Ht 5\' 3"  (1.6 m)   Wt 197 lb (89.4 kg)   SpO2 95%   BMI 34.90 kg/m    BP/Weight 05/07/2021 10/10/2020 36/64/4034  Systolic BP 742 595 638  Diastolic BP 82 90 82  Wt. (Lbs) 197 193 190  BMI 34.9 34.19 33.66    Physical Exam Vitals reviewed.  Constitutional:      Appearance: Normal appearance.  Cardiovascular:     Rate and Rhythm: Normal rate and regular rhythm.     Pulses: Normal pulses.     Heart sounds: Normal heart sounds.  Pulmonary:     Effort: Pulmonary effort is normal.     Breath sounds: Normal breath sounds.  Abdominal:     General: Bowel sounds are normal.      Palpations: Abdomen is soft.     Tenderness: There is no abdominal tenderness.  Skin:    General: Skin is warm and dry.     Capillary Refill: Capillary refill takes less than 2 seconds.  Neurological:     General: No focal deficit present.     Mental Status: She is alert and oriented to person, place, and time.  Psychiatric:        Mood and Affect: Mood normal.        Behavior: Behavior normal.       Lab Results  Component Value Date   WBC 9.9 05/07/2021   HGB 12.1 05/07/2021   HCT 37.4 05/07/2021   PLT 341 05/07/2021   GLUCOSE 108 (H) 05/07/2021   CHOL 221 (H) 06/19/2020   TRIG 158 (H) 06/19/2020   HDL 43 06/19/2020   LDLCALC 149 (H) 06/19/2020   ALT 14 05/07/2021   AST 15 05/07/2021   NA 142 05/07/2021   K 5.3 (H) 05/07/2021   CL 104 05/07/2021   CREATININE 0.82 05/07/2021   BUN 15 05/07/2021   CO2 24 05/07/2021   INR 0.9 08/04/2019   HGBA1C 6.1 (H) 06/19/2020      Assessment & Plan:   1. COPD with exacerbation (HCC) - albuterol (PROVENTIL) (2.5 MG/3ML) 0.083% nebulizer solution; Take 3 mLs (2.5 mg total) by nebulization every 6 (six) hours as needed for wheezing or shortness of breath.  Dispense: 75 mL; Refill: 0 - For home use only DME Nebulizer machine - budesonide-formoterol (SYMBICORT) 80-4.5 MCG/ACT inhaler; Inhale 2 puffs into the lungs 2 (two) times daily.  Dispense: 1 each; Refill: 3  2. Essential hypertension - lisinopril (ZESTRIL) 40 MG tablet; Take 1 tablet (40 mg total) by mouth daily.  Dispense: 90 tablet; Refill: 1  3. Urinary frequency - POCT urinalysis dipstick - Ambulatory referral to Urology  4. Overflow incontinence of urine - Ambulatory referral to Urology - oxybutynin (DITROPAN  XL) 10 MG 24 hr tablet; Take 1 tablet (10 mg total) by mouth at bedtime.  Dispense: 30 tablet; Refill: 0  5. Restless leg syndrome - gabapentin (NEURONTIN) 600 MG tablet; Take 1 tablet (600 mg total) by mouth at bedtime.  Dispense: 90 tablet; Refill: 1  6.  Need for immunization against influenza - Flu Vaccine QUAD High Dose(Fluad)     Begin Symbicort inhaler daily Begin Ditropan 10 mg at bedtime for overactive bladder Perform Kegel exercises daily We will call you with referral appointment to urologist Follow-up with cardiology as scheduled today Return to office in one week for chronic fasting appointmen     Follow-up: 1-week  An After Visit Summary was printed and given to the patient.  I, Rip Harbour, NP, have reviewed all documentation for this visit. The documentation on 06/26/21 for the exam, diagnosis, procedures, and orders are all accurate and complete.    Signed, Rip Harbour, NP [place] [phone]

## 2021-06-26 NOTE — Patient Instructions (Addendum)
Begin Symbicort inhaler daily Begin Ditropan 10 mg at bedtime for overactive bladder Perform Kegel exercises daily We will call you with referral appointment to urologist Follow-up with cardiology as scheduled today Return to office in one week for chronic fasting appointment   Overactive Bladder, Adult Overactive bladder is a condition in which a person has a sudden and frequent need to urinate. A person might also leak urine if he or she cannot get to the bathroom fast enough (urinary incontinence). Sometimes, symptoms can interfere with work or social activities. What are the causes? Overactive bladder is associated with poor nerve signals between your bladder and your brain. Your bladder may get the signal to empty before it is full. You may also have very sensitive muscles that make your bladder squeeze too soon. This condition may also be caused by other factors, such as: Medical conditions: Urinary tract infection. Infection of nearby tissues. Prostate enlargement. Bladder stones, inflammation, or tumors. Diabetes. Muscle or nerve weakness, especially from these conditions: A spinal cord injury. Stroke. Multiple sclerosis. Parkinson's disease. Other causes: Surgery on the uterus or urethra. Drinking too much caffeine or alcohol. Certain medicines, especially those that eliminate extra fluid in the body (diuretics). Constipation. What increases the risk? You may be at greater risk for overactive bladder if you: Are an older adult. Smoke. Are going through menopause. Have prostate problems. Have a neurological disease, such as stroke, dementia, Parkinson's disease, or multiple sclerosis (MS). Eat or drink alcohol, spicy food, caffeine, and other things that irritate the bladder. Are overweight or obese. What are the signs or symptoms? Symptoms of this condition include a sudden, strong urge to urinate. Other symptoms include: Leaking urine. Urinating 8 or more times a  day. Waking up to urinate 2 or more times overnight. How is this diagnosed? This condition may be diagnosed based on: Your symptoms and medical history. A physical exam. Blood or urine tests to check for possible causes, such as infection. You may also need to see a health care provider who specializes in urinary tract problems. This is called a urologist. How is this treated? Treatment for overactive bladder depends on the cause of your condition and whether it is mild or severe. Treatment may include: Bladder training, such as: Learning to control the urge to urinate by following a schedule to urinate at regular intervals. Doing Kegel exercises to strengthen the pelvic floor muscles that support your bladder. Special devices, such as: Biofeedback. This uses sensors to help you become aware of your body's signals. Electrical stimulation. This uses electrodes placed inside the body (implanted) or outside the body. These electrodes send gentle pulses of electricity to strengthen the nerves or muscles that control the bladder. Women may use a plastic device, called a pessary, that fits into the vagina and supports the bladder. Medicines, such as: Antibiotics to treat bladder infection. Antispasmodics to stop the bladder from releasing urine at the wrong time. Tricyclic antidepressants to relax bladder muscles. Injections of botulinum toxin type A directly into the bladder tissue to relax bladder muscles. Surgery, such as: A device may be implanted to help manage the nerve signals that control urination. An electrode may be implanted to stimulate electrical signals in the bladder. A procedure may be done to change the shape of the bladder. This is done only in very severe cases. Follow these instructions at home: Eating and drinking  Make diet or lifestyle changes recommended by your health care provider. These may include: Drinking fluids throughout the day  and not only with  meals. Cutting down on caffeine or alcohol. Eating a healthy and balanced diet to prevent constipation. This may include: Choosing foods that are high in fiber, such as beans, whole grains, and fresh fruits and vegetables. Limiting foods that are high in fat and processed sugars, such as fried and sweet foods. Lifestyle  Lose weight if needed. Do not use any products that contain nicotine or tobacco. These include cigarettes, chewing tobacco, and vaping devices, such as e-cigarettes. If you need help quitting, ask your health care provider. General instructions Take over-the-counter and prescription medicines only as told by your health care provider. If you were prescribed an antibiotic medicine, take it as told by your health care provider. Do not stop taking the antibiotic even if you start to feel better. Use any implants or pessary as told by your health care provider. If needed, wear pads to absorb urine leakage. Keep a log to track how much and when you drink, and when you need to urinate. This will help your health care provider monitor your condition. Keep all follow-up visits. This is important. Contact a health care provider if: You have a fever or chills. Your symptoms do not get better with treatment. Your pain and discomfort get worse. You have more frequent urges to urinate. Get help right away if: You are not able to control your bladder. Summary Overactive bladder refers to a condition in which a person has a sudden and frequent need to urinate. Several conditions may lead to an overactive bladder. Treatment for overactive bladder depends on the cause and severity of your condition. Making lifestyle changes, doing Kegel exercises, keeping a log, and taking medicines can help with this condition. This information is not intended to replace advice given to you by your health care provider. Make sure you discuss any questions you have with your health care  provider. Document Revised: 04/24/2020 Document Reviewed: 04/24/2020 Elsevier Patient Education  2022 [place].    Chronic Obstructive Pulmonary Disease Chronic obstructive pulmonary disease (COPD) is a long-term (chronic) lung problem. When you have COPD, it is hard for air to get in and out of your lungs. Usually the condition gets worse over time, and your lungs will never return to normal. There are things you can do to keep yourself as healthy as possible. What are the causes? Smoking. This is the most common cause. Certain genes passed from parent to child (inherited). What increases the risk? Being exposed to secondhand smoke from cigarettes, pipes, or cigars. Being exposed to chemicals and other irritants, such as fumes and dust in the work environment. Having chronic lung conditions or infections. What are the signs or symptoms? Shortness of breath, especially during physical activity. A long-term cough with a large amount of thick mucus. Sometimes, the cough may not have any mucus (dry cough). Wheezing. Breathing quickly. Skin that looks gray or blue, especially in the fingers, toes, or lips. Feeling tired (fatigue). Weight loss. Chest tightness. Having infections often. Episodes when breathing symptoms become much worse (exacerbations). At the later stages of this disease, you may have swelling in the ankles, feet, or legs. How is this treated? Taking medicines. Quitting smoking, if you smoke. Rehabilitation. This includes steps to make your body work better. It may involve a team of specialists. Doing exercises. Making changes to your diet. Using oxygen. Lung surgery. Lung transplant. Comfort measures (palliative care). Follow these instructions at home: Medicines Take over-the-counter and prescription medicines only as told by  your doctor. Talk to your doctor before taking any cough or allergy medicines. You may need to avoid medicines that cause your lungs  to be dry. Lifestyle If you smoke, stop smoking. Smoking makes the problem worse. Do not smoke or use any products that contain nicotine or tobacco. If you need help quitting, ask your doctor. Avoid being around things that make your breathing worse. This may include smoke, chemicals, and fumes. Stay active, but remember to rest as well. Learn and use tips on how to manage stress and control your breathing. Make sure you get enough sleep. Most adults need at least 7 hours of sleep every night. Eat healthy foods. Eat smaller meals more often. Rest before meals. Controlled breathing Learn and use tips on how to control your breathing as told by your doctor. Try: Breathing in (inhaling) through your nose for 1 second. Then, pucker your lips and breath out (exhale) through your lips for 2 seconds. Putting one hand on your belly (abdomen). Breathe in slowly through your nose for 1 second. Your hand on your belly should move out. Pucker your lips and breathe out slowly through your lips. Your hand on your belly should move in as you breathe out.  Controlled coughing Learn and use controlled coughing to clear mucus from your lungs. Follow these steps: Lean your head a little forward. Breathe in deeply. Try to hold your breath for 3 seconds. Keep your mouth slightly open while coughing 2 times. Spit any mucus out into a tissue. Rest and do the steps again 1 or 2 times as needed. General instructions Make sure you get all the shots (vaccines) that your doctor recommends. Ask your doctor about a flu shot and a pneumonia shot. Use oxygen therapy and pulmonary rehabilitation if told by your doctor. If you need home oxygen therapy, ask your doctor if you should buy a tool to measure your oxygen level (oximeter). Make a COPD action plan with your doctor. This helps you to know what to do if you feel worse than usual. Manage any other conditions you have as told by your doctor. Avoid going outside when  it is very hot, cold, or humid. Avoid people who have a sickness you can catch (contagious). Keep all follow-up visits. Contact a doctor if: You cough up more mucus than usual. There is a change in the color or thickness of the mucus. It is harder to breathe than usual. Your breathing is faster than usual. You have trouble sleeping. You need to use your medicines more often than usual. You have trouble doing your normal activities such as getting dressed or walking around the house. Get help right away if: You have shortness of breath while resting. You have shortness of breath that stops you from: Being able to talk. Doing normal activities. Your chest hurts for longer than 5 minutes. Your skin color is more blue than usual. Your pulse oximeter shows that you have low oxygen for longer than 5 minutes. You have a fever. You feel too tired to breathe normally. These symptoms may represent a serious problem that is an emergency. Do not wait to see if the symptoms will go away. Get medical help right away. Call your local emergency services (911 in the [place] Do not drive yourself to the hospital. Summary Chronic obstructive pulmonary disease (COPD) is a long-term lung problem. The way your lungs work will never return to normal. Usually the condition gets worse over time. There are things you can do  to keep yourself as healthy as possible. Take over-the-counter and prescription medicines only as told by your doctor. If you smoke, stop. Smoking makes the problem worse. This information is not intended to replace advice given to you by your health care provider. Make sure you discuss any questions you have with your health care provider. Document Revised: 06/13/2020 Document Reviewed: 06/13/2020 Elsevier Patient Education  2022 [place].    Overactive Bladder, Adult Overactive bladder is a condition in which a person has a sudden and frequent need to urinate. A person might also  leak urine if he or she cannot get to the bathroom fast enough (urinary incontinence). Sometimes, symptoms can interfere with work or social activities. What are the causes? Overactive bladder is associated with poor nerve signals between your bladder and your brain. Your bladder may get the signal to empty before it is full. You may also have very sensitive muscles that make your bladder squeeze too soon. This condition may also be caused by other factors, such as: Medical conditions: Urinary tract infection. Infection of nearby tissues. Prostate enlargement. Bladder stones, inflammation, or tumors. Diabetes. Muscle or nerve weakness, especially from these conditions: A spinal cord injury. Stroke. Multiple sclerosis. Parkinson's disease. Other causes: Surgery on the uterus or urethra. Drinking too much caffeine or alcohol. Certain medicines, especially those that eliminate extra fluid in the body (diuretics). Constipation. What increases the risk? You may be at greater risk for overactive bladder if you: Are an older adult. Smoke. Are going through menopause. Have prostate problems. Have a neurological disease, such as stroke, dementia, Parkinson's disease, or multiple sclerosis (MS). Eat or drink alcohol, spicy food, caffeine, and other things that irritate the bladder. Are overweight or obese. What are the signs or symptoms? Symptoms of this condition include a sudden, strong urge to urinate. Other symptoms include: Leaking urine. Urinating 8 or more times a day. Waking up to urinate 2 or more times overnight. How is this diagnosed? This condition may be diagnosed based on: Your symptoms and medical history. A physical exam. Blood or urine tests to check for possible causes, such as infection. You may also need to see a health care provider who specializes in urinary tract problems. This is called a urologist. How is this treated? Treatment for overactive bladder depends on  the cause of your condition and whether it is mild or severe. Treatment may include: Bladder training, such as: Learning to control the urge to urinate by following a schedule to urinate at regular intervals. Doing Kegel exercises to strengthen the pelvic floor muscles that support your bladder. Special devices, such as: Biofeedback. This uses sensors to help you become aware of your body's signals. Electrical stimulation. This uses electrodes placed inside the body (implanted) or outside the body. These electrodes send gentle pulses of electricity to strengthen the nerves or muscles that control the bladder. Women may use a plastic device, called a pessary, that fits into the vagina and supports the bladder. Medicines, such as: Antibiotics to treat bladder infection. Antispasmodics to stop the bladder from releasing urine at the wrong time. Tricyclic antidepressants to relax bladder muscles. Injections of botulinum toxin type A directly into the bladder tissue to relax bladder muscles. Surgery, such as: A device may be implanted to help manage the nerve signals that control urination. An electrode may be implanted to stimulate electrical signals in the bladder. A procedure may be done to change the shape of the bladder. This is done only in very severe  cases. Follow these instructions at home: Eating and drinking  Make diet or lifestyle changes recommended by your health care provider. These may include: Drinking fluids throughout the day and not only with meals. Cutting down on caffeine or alcohol. Eating a healthy and balanced diet to prevent constipation. This may include: Choosing foods that are high in fiber, such as beans, whole grains, and fresh fruits and vegetables. Limiting foods that are high in fat and processed sugars, such as fried and sweet foods. Lifestyle  Lose weight if needed. Do not use any products that contain nicotine or tobacco. These include cigarettes, chewing  tobacco, and vaping devices, such as e-cigarettes. If you need help quitting, ask your health care provider. General instructions Take over-the-counter and prescription medicines only as told by your health care provider. If you were prescribed an antibiotic medicine, take it as told by your health care provider. Do not stop taking the antibiotic even if you start to feel better. Use any implants or pessary as told by your health care provider. If needed, wear pads to absorb urine leakage. Keep a log to track how much and when you drink, and when you need to urinate. This will help your health care provider monitor your condition. Keep all follow-up visits. This is important. Contact a health care provider if: You have a fever or chills. Your symptoms do not get better with treatment. Your pain and discomfort get worse. You have more frequent urges to urinate. Get help right away if: You are not able to control your bladder. Summary Overactive bladder refers to a condition in which a person has a sudden and frequent need to urinate. Several conditions may lead to an overactive bladder. Treatment for overactive bladder depends on the cause and severity of your condition. Making lifestyle changes, doing Kegel exercises, keeping a log, and taking medicines can help with this condition. This information is not intended to replace advice given to you by your health care provider. Make sure you discuss any questions you have with your health care provider. Document Revised: 04/24/2020 Document Reviewed: 04/24/2020 Elsevier Patient Education  [employer].     Kegel Exercises Kegel exercises can help strengthen your pelvic floor muscles. The pelvic floor is a group of muscles that support your rectum, small intestine, and bladder. In females, pelvic floor muscles also help support the uterus. These muscles help you control the flow of urine and stool (feces). Kegel exercises are painless  and simple. They do not require any equipment. Your provider may suggest Kegel exercises to: Improve bladder and bowel control. Improve sexual response. Improve weak pelvic floor muscles after surgery to remove the uterus (hysterectomy) or after pregnancy, in females. Improve weak pelvic floor muscles after prostate gland removal or surgery, in males. Kegel exercises involve squeezing your pelvic floor muscles. These are the same muscles you squeeze when you try to stop the flow of urine or keep from passing gas. The exercises can be done while sitting, standing, or lying down, but it is best to vary your position. Ask your health care provider which exercises are safe for you. Do exercises exactly as told by your health care provider and adjust them as directed. Do not begin these exercises until told by your health care provider. Exercises How to do Kegel exercises: Squeeze your pelvic floor muscles tight. You should feel a tight lift in your rectal area. If you are a female, you should also feel a tightness in your vaginal area.  Keep your stomach, buttocks, and legs relaxed. Hold the muscles tight for up to 10 seconds. Breathe normally. Relax your muscles for up to 10 seconds. Repeat as told by your health care provider. Repeat this exercise daily as told by your health care provider. Continue to do this exercise for at least 4-6 weeks, or for as long as told by your health care provider. You may be referred to a physical therapist who can help you learn more about how to do Kegel exercises. Depending on your condition, your health care provider may recommend: Varying how long you squeeze your muscles. Doing several sets of exercises every day. Doing exercises for several weeks. Making Kegel exercises a part of your regular exercise routine. This information is not intended to replace advice given to you by your health care provider. Make sure you discuss any questions you have with your  health care provider. Document Revised: 12/14/2020 Document Reviewed: 12/14/2020 Elsevier Patient Education  2022 [employer].

## 2021-07-01 IMAGING — MR MR LUMBAR SPINE WO/W CM
4 of 8 series · 23 of 48 positions shown · IV contrast (gadavist)
Comparison: 03/05/2019

CLINICAL DATA: Back pain and urinary incontinence. Recent lumbar
surgery.

EXAM:
MRI LUMBAR SPINE WITHOUT AND WITH CONTRAST
TECHNIQUE: Multiplanar and multiecho pulse sequences of the lumbar spine were
obtained without and with intravenous contrast.
CONTRAST:  9mL GADAVIST GADOBUTROL 1 MMOL/ML IV SOLN

[Series 5: T2 · sagittal · 4.0mm · 0.73mm/px · 4 of 16 slices shown (1 of 2)]
[im 1/16]
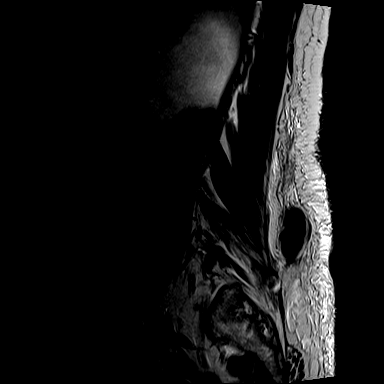
[im 6/16]
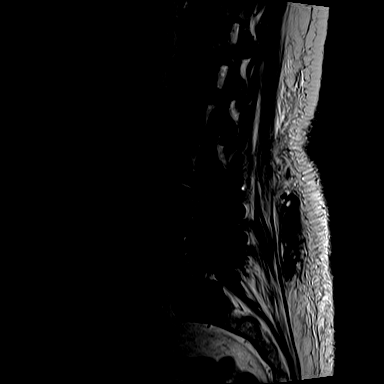
[im 11/16]
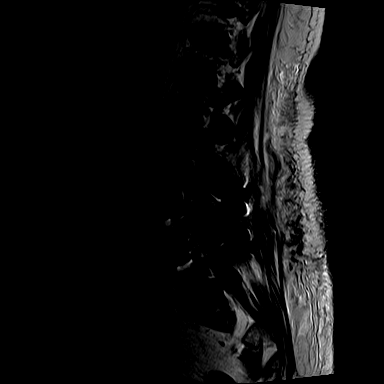
[im 16/16]
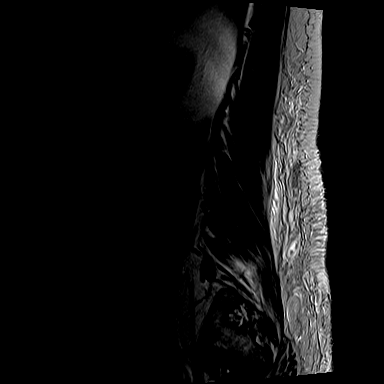

[Series 7: T1 · sagittal · 4.0mm · 0.88mm/px · 5 of 16 slices shown (1 of 2)]
[im 1/16]
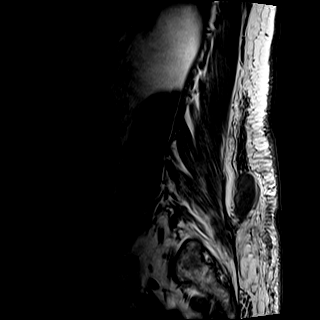
[im 4/16]
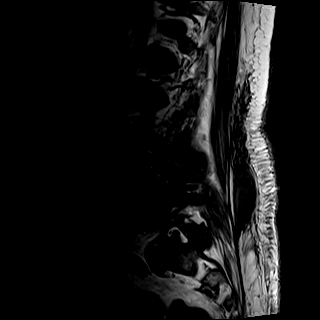
[im 8/16]
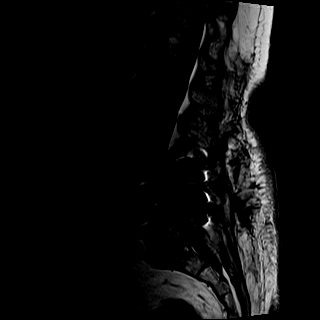
[im 12/16]
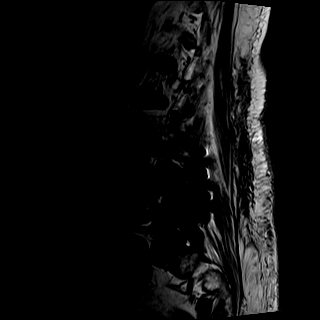
[im 16/16]
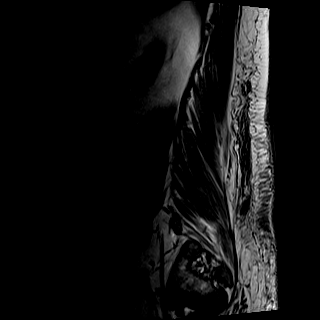

[Series 8: T2 · axial · 4.0mm · 0.57mm/px · z∈[-180,-19]mm · 8 of 28 slices shown (2 of 2)]
[im 1/28]
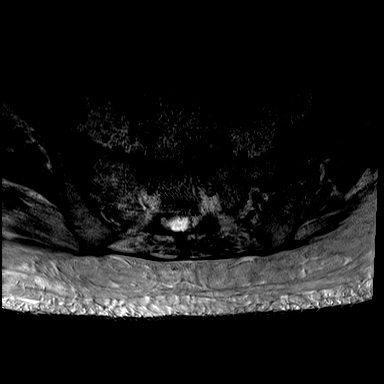
[im 4/28]
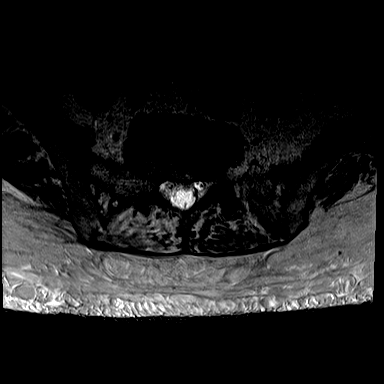
[im 8/28]
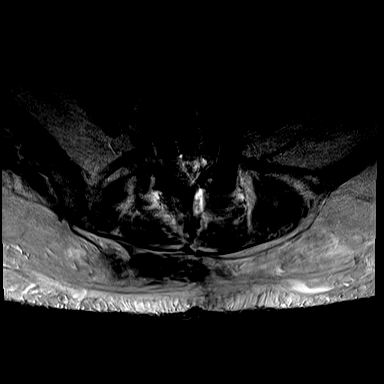
[im 12/28]
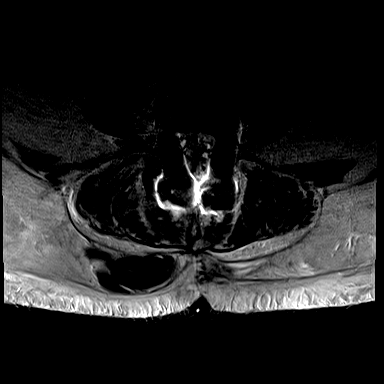
[im 16/28]
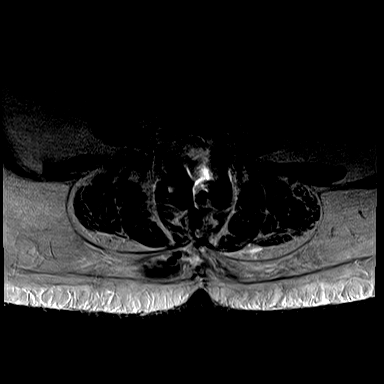
[im 20/28]
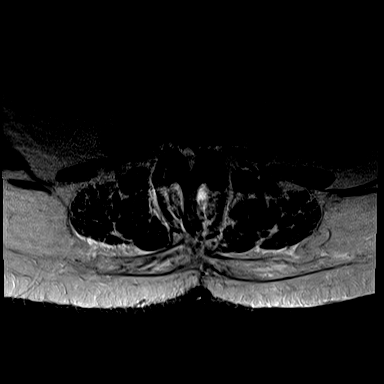
[im 24/28]
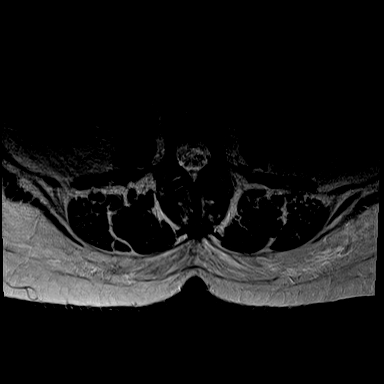
[im 28/28]
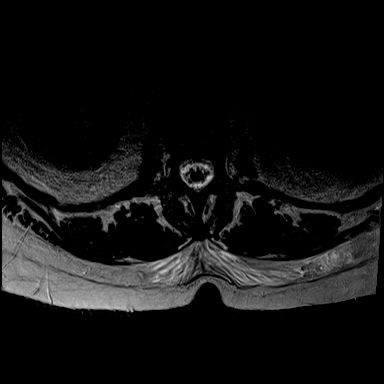

[Series 9: T1 · axial · 4.0mm · 0.34mm/px · z∈[-180,-43]mm · 6 of 28 slices shown (2 of 2)]
[im 1/28]
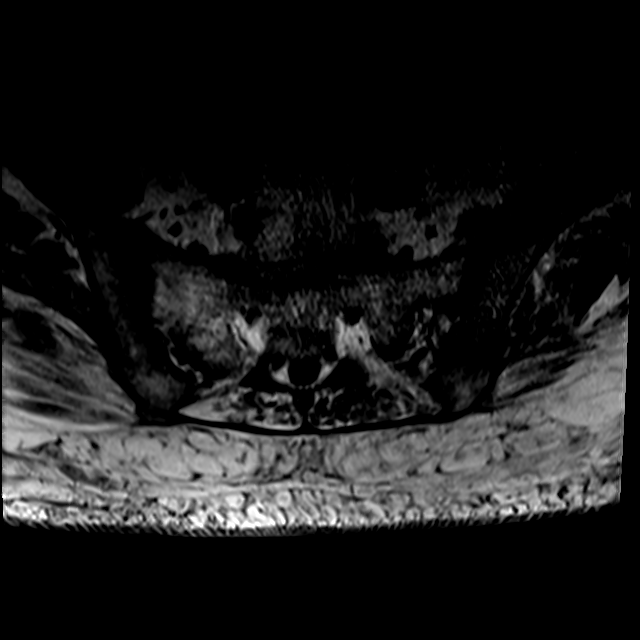
[im 4/28]
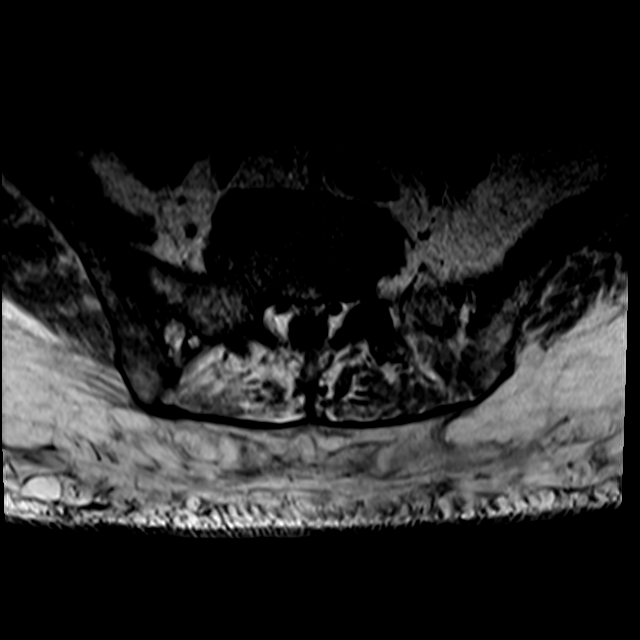
[im 8/28]
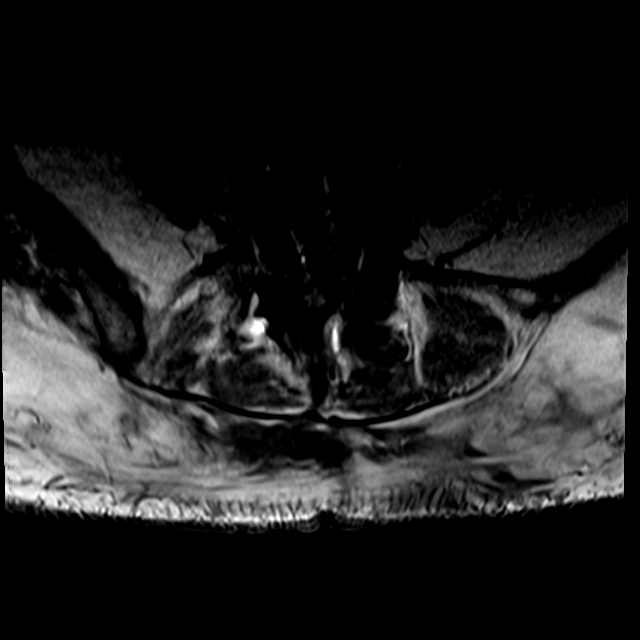
[im 12/28]
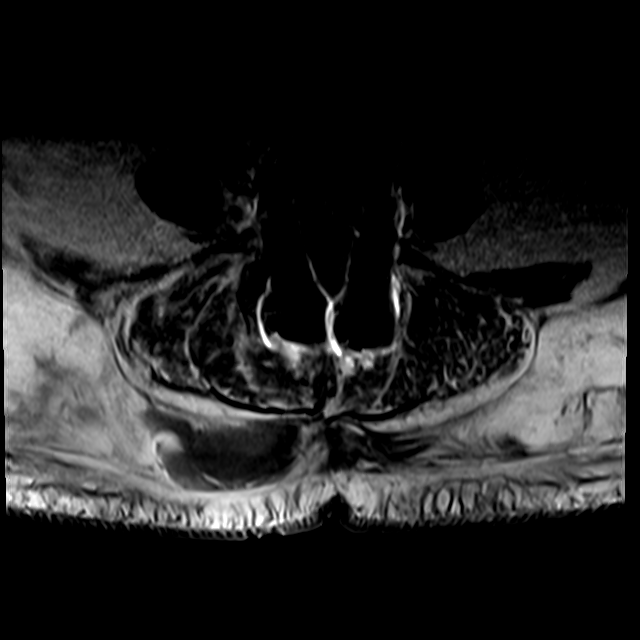
[im 16/28]
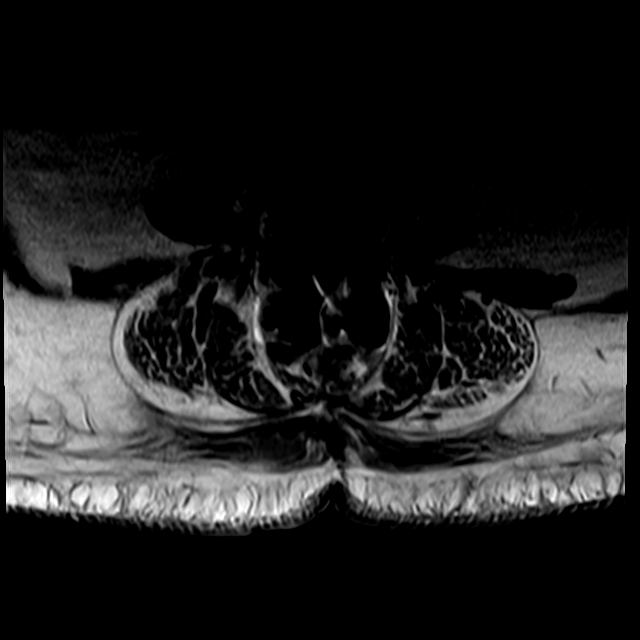
[im 24/28]
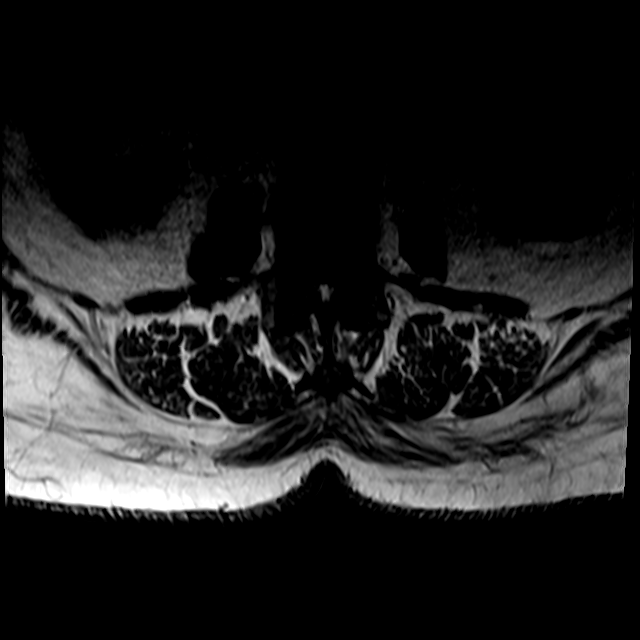

[23 of 48 positions shown; findings below may reference images not displayed]

FINDINGS: The study is motion degraded, including moderate motion on the axial
sequences.

Segmentation: Standard.

Alignment: Unchanged 3 mm retrolisthesis of L1 on L2 and L2 on L3.
Decreased anterolisthesis of L4 on L5 following interval fusion, now
5 mm.

Vertebrae: No fracture, suspicious marrow lesion, or evidence of
discitis. Interval L3-L5 PLIF. Unchanged hemangioma in the T12
vertebral body.

Conus medullaris and cauda equina: Conus extends to the L1-2 level
and appears normal. There is anterior displacement of the cauda
equina from the inferior L1 to the L4 levels by a CSF signal
intensity dorsal subdural fluid collection.

Paraspinal and other soft tissues: Postoperative changes in the
posterior lumbar soft tissues with dorsal epidural fluid collection
in the laminectomy bed from L3-L5. Subcutaneous fluid collection in
and to the right of midline from L3-L5 measures 6.2 x 2.0 x 9.2 cm
(transverse x AP x craniocaudal) and demonstrates intermediate to
low T1 and mixed T2 signal intensity likely reflecting in part a
hematoma.

Disc levels:

Disc desiccation throughout the lumbar spine with unchanged moderate
disc space narrowing at L1-2 and L2-3.

T11-12 and T12-L1: Only imaged sagittally. Minimal disc bulging at
both levels without stenosis, unchanged.

L1-2: Mild disc bulging and mild facet hypertrophy without
significant stenosis, unchanged.

L2-3: Circumferential disc bulging eccentric to the left, a left
subarticular disc extrusion with mild caudal migration, and moderate
facet and ligamentum flavum hypertrophy result in mild left lateral
recess stenosis without neural foraminal stenosis, similar to prior.
Anterior displacement of the nerve roots within the thecal sac and
partial effacement of subarachnoid CSF by the dorsal subdural fluid
collection.

L3-4: Interval posterior decompression and fusion. The dorsal
epidural fluid collection (with possible dorsal subdural fluid as
well) results in severe spinal stenosis with complete effacement of
subarachnoid CSF. Patent neural foramina.

L4-5: Interval posterior decompression and fusion. Motion and
susceptibility artifact limits assessment of the spinal canal on
axial images. Anterolisthesis with bulging uncovered disc, a central
pseudo disc extrusion, and the dorsal epidural fluid collection
result in severe spinal stenosis. There is moderate residual right
neural foraminal stenosis.

L5-S1: Disc bulging, a left foraminal disc extrusion, and severe
facet hypertrophy result in moderate left neural foraminal stenosis
which has mildly progressed. No spinal stenosis.
IMPRESSION: 1. Interval L3-L5 PLIF with a dorsal epidural fluid collection
resulting in severe spinal stenosis at L3-4 and L4-5.
2. Subdural fluid collection from L1-2 to L3-4, a not infrequent
finding in the immediate postoperative although subdural fluid may
contribute to the spinal stenosis at L3-4.
3. Left foraminal disc extrusion at L5-S1 with moderate foraminal
stenosis, mildly progressed.
4. Unchanged left subarticular disc extrusion at L2-3 with mild left
lateral recess stenosis.

## 2021-07-02 ENCOUNTER — Encounter: Payer: Self-pay | Admitting: Pharmacist

## 2021-07-02 ENCOUNTER — Encounter: Payer: Self-pay | Admitting: Neurology

## 2021-07-02 ENCOUNTER — Ambulatory Visit: Payer: Medicare Other | Admitting: Neurology

## 2021-07-02 VITALS — BP 159/95 | HR 90 | Ht 63.0 in | Wt 199.8 lb

## 2021-07-02 DIAGNOSIS — R269 Unspecified abnormalities of gait and mobility: Secondary | ICD-10-CM | POA: Insufficient documentation

## 2021-07-02 DIAGNOSIS — M542 Cervicalgia: Secondary | ICD-10-CM | POA: Insufficient documentation

## 2021-07-02 DIAGNOSIS — G720 Drug-induced myopathy: Secondary | ICD-10-CM

## 2021-07-02 DIAGNOSIS — G2581 Restless legs syndrome: Secondary | ICD-10-CM | POA: Diagnosis not present

## 2021-07-02 MED ORDER — PREGABALIN 100 MG PO CAPS: 100.0000 mg | ORAL_CAPSULE | Freq: Three times a day (TID) | ORAL | 5 refills | Status: DC

## 2021-07-02 NOTE — Progress Notes (Signed)
Chief Complaint  Patient presents with   New Patient (Initial Visit)    Room 15. Patient alone. Referred by Rheumatologist, Leafy Kindle, PA. Patient has numbness in feet and believes that she may also have RLS.       ASSESSMENT AND PLAN  Caitlin [name] is a 75 y.o. female   Bilateral lower extremity paresthesia Neck pain, Gait abnormality  She has a long history of diabetes, does have length dependent changes, but she has hyperreflexia of both upper and lower extremity, bilateral Babinski signs, significant osteoarthritis, history of lumbar degenerative disease, required lumbar decompression surgery in the past,  MRI of cervical spine to rule out cervical spondylitic myelopathy  Restless leg symptoms  Long history of diabetic peripheral neuropathy, restless leg symptoms, was on titrating dose of Requip, up to 3 mg 6 times a day, developed augmentation, has stopped taking it since October 2022, there was no significant change in her lower extremity symptoms,  Gabapentin 600 mg once a day provide limited help, will try Lyrica 100 mg 3 times a day,  Laboratory evaluations, including ferritin level   DIAGNOSTIC DATA (LABS, IMAGING, TESTING) - I reviewed patient records, labs, notes, testing and imaging myself where available.  MRI lumbar in Dec 2020 1. 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 2. Subdural fluid collection from L1-2 to L3-4, a not infrequent finding in the immediate postoperative although subdural fluid may contribute to the spinal stenosis at L3-4. 3. Left foraminal disc extrusion at L5-S1 with moderate foraminal stenosis, mildly progressed. 4. Unchanged left subarticular disc extrusion at L2-3 with mild left lateral recess stenosis.  MEDICAL HISTORY:  Caitlin [name] is a 75 year old female, seen in request by PA Leafy Kindle, for evaluation of bilateral lower extremity paresthesia, gait  abnormality, her primary care physician is Dr. Rochel Brome, initial evaluation was on July 02, 2021.  I reviewed and summarized the referring note. PMHX DM  A fib, s/p ablation, is on Xarelto. HTN Lumbar decompression in 2020  Patient had long history of diabetes, become insulin-dependent since 2018, also developed peripheral neuropathy, palm of her feet warm, burning sensation, radiating towards leg,  She has been diagnosed with restless leg syndrome was treated with titrating dose of Requip over the past 10 years, she complains of worsening restless leg syndrome since her lumbar decompression surgery in 2020, severe lumbar spondylosis with stenosis at L3-4 L4-5 level,  She had decompressive lumbar laminectomy L3-4, L4-5 and posterior lumbar interbody fusion on August 04, 2019.  But developed epidural hematoma, required evacuation, she does have a history of atrial fibrillation, supposed to take Xarelto, has failed multiple A. fib ablation procedure in the past, she missed multiple cardiology follow-up, stop the Xarelto treatment by herself, emphasized importance of compliant with anticoagulation,  She now complains of bilateral lower extremity severe discomfort, especially at evening time at nighttime, after prolonged sitting, trying to go to sleep, she felt there bilateral lower extremity especially left lateral calf area muscle spasm sensation, has the urge to move, pacing around, rubbing her leg often helps her some.  She was given Requip for restless leg symptoms, gradually on titrating dose up to 3 mg 6 tablets a day before she started in October 2020, even at that dose of Requip is no longer helpful, she was given gabapentin 600 mg daily following that, she denies significant improvement,  She complains of intermittent gait abnormality, neck pain, radiating pain to bilateral  shoulder, worsening urinary urgency, frequency, frequent nocturia,  PHYSICAL EXAM:   There were no vitals  filed for this visit. Not recorded     There is no height or weight on file to calculate BMI.  PHYSICAL EXAMNIATION:  Gen: NAD, conversant, well nourised, well groomed                     Cardiovascular: Irregular rate and rhythm Eyes: Conjunctivae clear without exudates or hemorrhage Neck: Supple, no carotid bruits. Pulmonary: Clear to auscultation bilaterally   NEUROLOGICAL EXAM:  MENTAL STATUS: Speech:    Speech is normal; fluent and spontaneous with normal comprehension.  Cognition:     Orientation to time, place and person     Normal recent and remote memory     Normal Attention span and concentration     Normal Language, naming, repeating,spontaneous speech     Fund of knowledge   CRANIAL NERVES: CN II: Visual fields are full to confrontation. Pupils are round equal and briskly reactive to light. CN III, IV, VI: extraocular movement are normal. No ptosis. CN V: Facial sensation is intact to light touch CN VII: Face is symmetric with normal eye closure  CN VIII: Hearing is normal to causal conversation. CN IX, X: Phonation is normal. CN XI: Head turning and shoulder shrug are intact  MOTOR: There is no pronator drift of out-stretched arms. Muscle bulk and tone are normal. Muscle strength is normal.  REFLEXES: Reflexes are 3 and symmetric at the biceps, triceps, knees, and ankles. Plantar responses are extensor bilaterally  SENSORY: Length dependent decreased vibratory sensation, light touch, pinprick to distal shin level  COORDINATION: There is no trunk or limb dysmetria noted.  GAIT/STANCE: She needs push-up to get up from seated position, cautious, slightly unsteady  REVIEW OF SYSTEMS:  Full 14 system review of systems performed and notable only for as above All other review of systems were negative.   ALLERGIES: Allergies  Allergen Reactions   Codeine Nausea Only    HOME MEDICATIONS: Current Outpatient Medications  Medication Sig Dispense Refill    albuterol (PROVENTIL) (2.5 MG/3ML) 0.083% nebulizer solution Take 3 mLs (2.5 mg total) by nebulization every 6 (six) hours as needed for wheezing or shortness of breath. 75 mL 0   budesonide-formoterol (SYMBICORT) 80-4.5 MCG/ACT inhaler Inhale 2 puffs into the lungs 2 (two) times daily. 1 each 3   calcium carbonate (TUMS - DOSED IN MG ELEMENTAL CALCIUM) 500 MG chewable tablet Chew 2 tablets by mouth daily as needed for indigestion.      Calcium-Vitamin D-Vitamin K 500-100-40 MG-UNT-MCG CHEW Chew 1 tablet by mouth at bedtime.     Continuous Blood Gluc Receiver (FREESTYLE LIBRE 14 DAY READER) DEVI USE TO CHECK BLOOD SUGARS DAILY     Continuous Blood Gluc Sensor (FREESTYLE LIBRE 14 DAY SENSOR) MISC SMARTSIG:1 Topical Every 2 Weeks     diltiazem (CARDIZEM CD) 240 MG 24 hr capsule Take 240 mg by mouth daily.     fexofenadine (ALLEGRA) 180 MG tablet Take 180 mg by mouth daily as needed for allergies or rhinitis.     gabapentin (NEURONTIN) 600 MG tablet Take 1 tablet (600 mg total) by mouth at bedtime. 90 tablet 1   Insulin Degludec (TRESIBA FLEXTOUCH) 200 UNIT/ML SOPN Inject 70 Units into the skin every evening.      Insulin Pen Needle 31G X 8 MM MISC Inject 1 Units into the skin daily.     Insulin Pen Needle 32G X 4  MM MISC 1 each by Does not apply route in the morning and at bedtime. 100 each 2   lisinopril (ZESTRIL) 40 MG tablet Take 1 tablet (40 mg total) by mouth daily. 90 tablet 1   omeprazole (PRILOSEC) 40 MG capsule Take 40 mg by mouth daily.     oxybutynin (DITROPAN XL) 10 MG 24 hr tablet Take 1 tablet (10 mg total) by mouth at bedtime. 30 tablet 0   rOPINIRole (REQUIP) 3 MG tablet TAKE 1 TABLET BY MOUTH TWICE A DAY 180 tablet 0   rosuvastatin (CRESTOR) 10 MG tablet TAKE 1 TABLET (10 MG TOTAL) BY MOUTH 2 (TWO) TIMES A WEEK. 24 tablet 0   Semaglutide,0.25 or 0.5MG /DOS, 2 MG/1.5ML SOPN Inject 0.5 mg into the skin every Monday.      UPNEEQ 0.1 % SOLN Apply 1 drop to eye daily.     VITAMIN  D-VITAMIN K PO Take 1 tablet by mouth every evening.     No current facility-administered medications for this visit.    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 Small internal hemorrhoids. Otherwise normal colonosopy.    LUMBAR WOUND DEBRIDEMENT N/A 08/09/2019   Procedure: LUMBAR WOUND DEBRIDEMENT Evacuation of  EPIDURAL HEMATOMA;  Surgeon: Newman Pies, MD;  Location: [facility];  Service: Neurosurgery;  Laterality: N/A;    FAMILY HISTORY: Family History  Problem Relation Age of Onset   Colon cancer Paternal Aunt 97    SOCIAL HISTORY: Social History   Socioeconomic History   Marital status: Married    Spouse name: Not on file   Number of children: Not on file   Years of education: Not on file   Highest education level: Not on file  Occupational History   Not on file  Tobacco Use   Smoking status: Never   Smokeless tobacco: Never  Vaping Use   Vaping Use: Never used  Substance and Sexual Activity   Alcohol use: Not Currently   Drug use: Never   Sexual activity: Not Currently  Other Topics  Concern   Not on file  Social History Narrative   Not on file   Social Determinants of Health   Financial Resource Strain: Not on file  Food Insecurity: Not on file  Transportation Needs: Not on file  Physical Activity: Not on file  Stress: Not on file  Social Connections: Not on file  Intimate Partner Violence: Not on file      Marcial Pacas, M.D. Ph.D.  [facility] [address] [place],  [place] [address] Ph: [phone] Fax: [phone]  CC:  Leafy Kindle, PA-C [address],  [place] [address]  Rochel Brome, MD

## 2021-07-02 NOTE — Progress Notes (Signed)
[facility])                                            [place] Team                                        Statin Quality Measure Assessment    07/02/2021  Caitlin [name] July 30, 1946 [id number]  Per review of chart and payor information, patient has a diagnosis of diabetes but is not currently filling a statin prescription.  This places patient into the SUPD (Statin Use In Patients with Diabetes) measure for CMS.     Spoke with patient via telephone a few months ago. HIPAA identifiers were obtained. She said she is no longer taking rosuvastatin because it caused muscle aches.     Since she reported muscle aches and was already on alternative dosing, a statin exclusion code could be associated with her next provider visit and that would remove her from the measure.   Rosuvastatin was removed from her medication list during her visit with Dr. Krista Blue on 07/02/21   The 10-year ASCVD risk score (Arnett DK, et al., 2019) is: 44.3%   Values used to calculate the score:     Age: 75 years     Sex: Female     Is Non-Hispanic African American: No     Diabetic: Yes     Tobacco smoker: No     Systolic Blood Pressure: 291 mmHg     Is BP treated: Yes     HDL Cholesterol: 43 mg/dL     Total Cholesterol: 221 mg/dL 06/19/2020     Component Value Date/Time   CHOL 221 (H) 06/19/2020 1136   TRIG 158 (H) 06/19/2020 1136   HDL 43 06/19/2020 1136   LDLCALC 149 (H) 06/19/2020 1136    Please consider ONE of the following recommendations:  Initiate high intensity statin Atorvastatin 40mg  once daily, #90, 3 refills   Rosuvastatin 20mg  once daily, #90, 3 refills    Initiate moderate intensity          statin with reduced frequency if prior          statin intolerance 1x weekly, #13, 3 refills   2x weekly, #26, 3 refills   3x weekly, #39, 3 refills    Code for past statin intolerance or  other exclusions (required annually)    Provider Requirements:  Associate code during an office visit or telehealth encounter  Drug Induced Myopathy G72.0   Myopathy, unspecified G72.9   Myositis, unspecified M60.9   Rhabdomyolysis B16.60   Alcoholic fatty liver A00.4   Cirrhosis of liver K74.69   Prediabetes R73.03   PCOS E28.2   Toxic liver disease, unspecified K71.9   Adverse effect of antihyperlipidemic and antiarteriosclerotic drugs, initial encounter T46.6X5A   Plan:  Send note to PCP.  Elayne Guerin, PharmD, [place] Clinical Pharmacist [phone]

## 2021-07-02 NOTE — Patient Instructions (Signed)
Meds ordered this encounter  Medications   pregabalin (LYRICA) 100 MG capsule    Sig: Take 1 capsule (100 mg total) by mouth 3 (three) times daily.    Dispense:  90 capsule    Refill:  5     Stop Gabapentin 600mg  daily

## 2021-07-03 ENCOUNTER — Telehealth: Payer: Self-pay | Admitting: Neurology

## 2021-07-03 ENCOUNTER — Ambulatory Visit (INDEPENDENT_AMBULATORY_CARE_PROVIDER_SITE_OTHER): Payer: Medicare Other | Admitting: Family Medicine

## 2021-07-03 ENCOUNTER — Other Ambulatory Visit: Payer: Self-pay

## 2021-07-03 VITALS — BP 136/82 | HR 75 | Temp 97.1°F | Ht 63.0 in | Wt 196.0 lb

## 2021-07-03 DIAGNOSIS — F331 Major depressive disorder, recurrent, moderate: Secondary | ICD-10-CM

## 2021-07-03 DIAGNOSIS — J4540 Moderate persistent asthma, uncomplicated: Secondary | ICD-10-CM | POA: Diagnosis not present

## 2021-07-03 DIAGNOSIS — Z794 Long term (current) use of insulin: Secondary | ICD-10-CM

## 2021-07-03 DIAGNOSIS — K219 Gastro-esophageal reflux disease without esophagitis: Secondary | ICD-10-CM

## 2021-07-03 DIAGNOSIS — I10 Essential (primary) hypertension: Secondary | ICD-10-CM

## 2021-07-03 DIAGNOSIS — E1140 Type 2 diabetes mellitus with diabetic neuropathy, unspecified: Secondary | ICD-10-CM

## 2021-07-03 DIAGNOSIS — E039 Hypothyroidism, unspecified: Secondary | ICD-10-CM

## 2021-07-03 DIAGNOSIS — I700 Atherosclerosis of aorta: Secondary | ICD-10-CM

## 2021-07-03 DIAGNOSIS — E782 Mixed hyperlipidemia: Secondary | ICD-10-CM

## 2021-07-03 DIAGNOSIS — G4733 Obstructive sleep apnea (adult) (pediatric): Secondary | ICD-10-CM

## 2021-07-03 DIAGNOSIS — I152 Hypertension secondary to endocrine disorders: Secondary | ICD-10-CM

## 2021-07-03 DIAGNOSIS — E1159 Type 2 diabetes mellitus with other circulatory complications: Secondary | ICD-10-CM

## 2021-07-03 DIAGNOSIS — I480 Paroxysmal atrial fibrillation: Secondary | ICD-10-CM

## 2021-07-03 LAB — HGB A1C W/O EAG: Hgb A1c MFr Bld: 6.8 % — ABNORMAL HIGH (ref 4.8–5.6)

## 2021-07-03 LAB — VITAMIN B12: Vitamin B-12: 475 pg/mL (ref 232–1245)

## 2021-07-03 LAB — FERRITIN: Ferritin: 18 ng/mL (ref 15–150)

## 2021-07-03 NOTE — Patient Instructions (Signed)
Call about grief counseling. Handout given.

## 2021-07-03 NOTE — Telephone Encounter (Signed)
Please call patient, laboratory evaluation showed decreased ferritin of 18, she would benefit the over-the-counter iron supplement ( according to bottle instruction), this likely will help her restless leg symptoms,  A1c was 6.8, consistent with her diagnosis of diabetes

## 2021-07-03 NOTE — Progress Notes (Signed)
Subjective:  Patient ID: Caitlin [name], female    DOB: 1945-10-23  Age: 75 y.o. MRN: [id number]  Chief Complaint  Patient presents with   Depression   Hypertension   Hyperlipidemia    HPI Patient is a 75 year old white female who presents for chronic follow-up of her hypertension, atrial fibrillation, congestive heart failure, type 2 diabetes with neuropathy, acquired hypothyroidism, GERD, OSA, and most recently was diagnosed with COPD at [facility] emergency department and given Symbicort and albuterol nebulizers.  Diabetes:  Complications: Neuropathy Glucose checking: Free style CGM Glucose logs: 103-162. Hypoglycemia: 39-53. Most recent A1C: 6.8 (07/02/2021) Current medications: Tresiba 70 units nightly, Ozempic 0.5 mg weekly.  For neuropathy she saw neurology, Dr. Krista Blue, yesterday and they started her on Lyrica 100 mg 3 times a day. Last Eye Exam: spring 2022 Foot checks: daily Patient last saw Dr. Meredith Pel in January 2022.  She is overdue for her visit.  Hypertension:  Current medications: Lisinopril 40 mg qd.  Hypothyroidism: pt was on synthroid from 2015 to 2017. Has not been on any since.   COPD?  Patient was seen at [facility] emergency department on June 16, 2021.  She presented with chest discomfort worsening cough, and shortness of breath.  Troponins were negative.  proBNP was normal.  Chest x-ray was normal.  EKG normal sinus rhythm, normal ST-T waves per report.  Rest of labs were essentially normal.  CT of the head of her chest showed aortic atherosclerosis and coronary artery calcifications but otherwise was normal.  No PEs or pneumonia.  The patient was told should they were to treat her for COPD exacerbation.  Patient has never smoked.  She was given a course of prednisone and refilled albuterol nebulizer solution which she had received at some point in the past.  Patient denies any issues with her breathing today.  She was started on Symbicort 2 puffs twice daily.  At  the emergency department the patient had stopped her Xarelto approximately 1 month prior to this visit.  The ED doctor did encourage her to restart which she did.  Diet: Patient reports that needs improvement. Exercise: Low amount of exercise.  Patient Is complaining of feeling depressed.  She is frustrated as she is not able to do all the things she used to do. She has been on cymbalta, remeron, buspirone, and ativan over the last 10 yrs.   PHQ9 SCORE ONLY 06/26/2021 06/26/2021 10/10/2020  PHQ-9 Total Score 12 0 0     Current Outpatient Medications on File Prior to Visit  Medication Sig Dispense Refill   rivaroxaban (XARELTO) 20 MG TABS tablet Take by mouth at bedtime.     albuterol (PROVENTIL) (2.5 MG/3ML) 0.083% nebulizer solution Take 3 mLs (2.5 mg total) by nebulization every 6 (six) hours as needed for wheezing or shortness of breath. 75 mL 0   budesonide-formoterol (SYMBICORT) 80-4.5 MCG/ACT inhaler Inhale 2 puffs into the lungs 2 (two) times daily. 1 each 3   calcium carbonate (TUMS - DOSED IN MG ELEMENTAL CALCIUM) 500 MG chewable tablet Chew 2 tablets by mouth daily as needed for indigestion.      Calcium-Vitamin D-Vitamin K 500-100-40 MG-UNT-MCG CHEW Chew 1 tablet by mouth at bedtime.     Continuous Blood Gluc Receiver (FREESTYLE LIBRE 14 DAY READER) DEVI USE TO CHECK BLOOD SUGARS DAILY     Continuous Blood Gluc Sensor (FREESTYLE LIBRE 14 DAY SENSOR) MISC SMARTSIG:1 Topical Every 2 Weeks     fexofenadine (ALLEGRA) 180 MG tablet Take  180 mg by mouth daily as needed for allergies or rhinitis.     Insulin Degludec (TRESIBA FLEXTOUCH) 200 UNIT/ML SOPN Inject 70 Units into the skin every evening.      Insulin Pen Needle 31G X 8 MM MISC Inject 1 Units into the skin daily.     Insulin Pen Needle 32G X 4 MM MISC 1 each by Does not apply route in the morning and at bedtime. 100 each 2   lisinopril (ZESTRIL) 40 MG tablet Take 1 tablet (40 mg total) by mouth daily. 90 tablet 1   omeprazole  (PRILOSEC) 40 MG capsule Take 40 mg by mouth daily.     oxybutynin (DITROPAN XL) 10 MG 24 hr tablet Take 1 tablet (10 mg total) by mouth at bedtime. 30 tablet 0   pregabalin (LYRICA) 100 MG capsule Take 1 capsule (100 mg total) by mouth 3 (three) times daily. 90 capsule 5   Semaglutide,0.25 or 0.5MG /DOS, 2 MG/1.5ML SOPN Inject 0.5 mg into the skin every Monday.      VITAMIN D-VITAMIN K PO Take 1 tablet by mouth every evening.     No current facility-administered medications on file prior to visit.   Past Medical History:  Diagnosis Date   Arthritis    Asthma    Atrial fibrillation (HCC)    Cauda equina syndrome ([place]) 5/36/6440   Complication of anesthesia    COPD (chronic obstructive pulmonary disease) (HCC)    Depression    Diabetes mellitus without complication (HCC)    Type II   Diabetic neuropathy, type II diabetes mellitus ([place]) 12/13/2019   Dysrhythmia    GERD (gastroesophageal reflux disease)    HTN (hypertension)    Hypercholesteremia    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[place]) 12/13/2019   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[place]) 12/13/2019   Morbid obesity ([place]) 12/13/2019   Obstructive sleep apnea    PONV (postoperative nausea and vomiting)    "not the last few times"   RLS (restless legs syndrome)    S/P ablation of atrial flutter 02/12/2016   S/P lumbar fusion 08/04/2019   Sleep apnea    Past Surgical History:  Procedure Laterality Date   ATRIAL FIBRILLATION ABLATION     x3   COLONOSCOPY  01/04/2013   Moderate predominantly sigmoid diverticulosis. Small internal hemorrhoids. Otherwise normal colonosopy.    LUMBAR WOUND DEBRIDEMENT N/A 08/09/2019   Procedure: LUMBAR WOUND DEBRIDEMENT Evacuation of  EPIDURAL HEMATOMA;  Surgeon: Newman Pies, MD;  Location: [facility];  Service: Neurosurgery;  Laterality: N/A;    Family History  Problem Relation Age of Onset   Breast cancer Mother 60   Heart attack Father 26   Mental  illness Sister    Heart Problems Brother 22   Cancer Brother    Breast cancer Paternal Grandmother    Colon cancer Paternal Aunt 52   Social History   Socioeconomic History   Marital status: Married    Spouse name: Not on file   Number of children: Not on file   Years of education: Not on file   Highest education level: Not on file  Occupational History   Not on file  Tobacco Use   Smoking status: Never    Passive exposure: Past   Smokeless tobacco: Never  Vaping Use   Vaping Use: Never used  Substance and Sexual Activity   Alcohol use: Not Currently   Drug use: Never   Sexual activity: Not Currently  Other Topics Concern  Not on file  Social History Narrative   Not on file   Social Determinants of Health   Financial Resource Strain: Not on file  Food Insecurity: Not on file  Transportation Needs: Not on file  Physical Activity: Not on file  Stress: Not on file  Social Connections: Not on file    Review of Systems  Constitutional:  Negative for chills, fatigue and fever.  HENT:  Positive for rhinorrhea. Negative for congestion, ear pain and sore throat.   Respiratory:  Positive for cough. Negative for shortness of breath.   Cardiovascular:  Negative for chest pain.  Gastrointestinal:  Negative for abdominal pain, constipation, diarrhea, nausea and vomiting.  Endocrine: Positive for polyuria.  Genitourinary:  Negative for dysuria and urgency.  Musculoskeletal:  Positive for back pain. Negative for myalgias.  Neurological:  Negative for dizziness, weakness, light-headedness and headaches.  Psychiatric/Behavioral:  Negative for dysphoric mood. The patient is not nervous/anxious.     Objective:  BP 136/82   Pulse 75   Temp (!) 97.1 F (36.2 C)   Ht 5\' 3"  (1.6 m)   Wt 196 lb (88.9 kg)   SpO2 99%   BMI 34.72 kg/m   BP/Weight 07/05/2021 07/03/2021 90/24/0973  Systolic BP 532 992 426  Diastolic BP 82 82 95  Wt. (Lbs) 199 196 199.8  BMI 35.25 34.72 35.39     Physical Exam Vitals reviewed.  Constitutional:      Appearance: Normal appearance. She is obese.  Neck:     Vascular: No carotid bruit.  Cardiovascular:     Rate and Rhythm: Normal rate. Rhythm irregular.     Pulses: Normal pulses.     Heart sounds: Normal heart sounds.  Pulmonary:     Effort: Pulmonary effort is normal. No respiratory distress.     Breath sounds: Normal breath sounds.  Abdominal:     General: Abdomen is flat. Bowel sounds are normal.     Palpations: Abdomen is soft.     Tenderness: There is no abdominal tenderness.  Neurological:     Mental Status: She is alert and oriented to person, place, and time.  Psychiatric:        Mood and Affect: Mood normal.        Behavior: Behavior normal.    Diabetic Foot Exam - Simple   Simple Foot Form  07/03/2021 10:41 PM  Visual Inspection See comments: Yes Sensation Testing See comments: Yes Pulse Check Posterior Tibialis and Dorsalis pulse intact bilaterally: Yes Comments Dry skin.  Right bunion Decreased sensation of bl feet.       Lab Results  Component Value Date   WBC 10.2 07/03/2021   HGB 13.0 07/03/2021   HCT 40.4 07/03/2021   PLT 310 07/03/2021   GLUCOSE 107 (H) 07/03/2021   CHOL 267 (H) 07/03/2021   TRIG 163 (H) 07/03/2021   HDL 49 07/03/2021   LDLCALC 188 (H) 07/03/2021   ALT 12 07/03/2021   AST 16 07/03/2021   NA 143 07/03/2021   K 4.6 07/03/2021   CL 102 07/03/2021   CREATININE 0.81 07/03/2021   BUN 13 07/03/2021   CO2 23 07/03/2021   TSH 1.380 07/03/2021   INR 0.9 08/04/2019   HGBA1C 6.8 (H) 07/02/2021      Assessment & Plan:   Problem List Items Addressed This Visit       Cardiovascular and Mediastinum   Hypertension associated with diabetes ([place]) (Chronic)    Well-controlled.  Continue lisinopril 40 mg once daily.  Relevant Medications   rivaroxaban (XARELTO) 20 MG TABS tablet   Other Relevant Orders   CBC with Differential/Platelet (Completed)   Comprehensive  metabolic panel (Completed)   TSH (Completed)   Atrial fibrillation (HCC)    Xarelto 20 mg once daily. Atrial fibrillation is paroxysmal and has been rate controlled Patient is scheduled to see Dr. Bettina Gavia in December 2022.   She reports she has been unable to get into see Dr. Ola Spurr due to his busy schedule.      Relevant Medications   rivaroxaban (XARELTO) 20 MG TABS tablet   Aortic atherosclerosis ([place])    Needs to be on a statin. LDL very high.  Recommended crestor 20 mg once daily.       Relevant Medications   rivaroxaban (XARELTO) 20 MG TABS tablet     Respiratory   Obstructive sleep apnea    Intolerant to cpap      Asthma    Based on the ED report and lack of evidence of COPD on her CTA of her chest I believe that she had an asthma exacerbation. Agree with continuing Symbicort 2 puffs twice daily.        Digestive   Gastroesophageal reflux disease without esophagitis - Primary    Continue omeprazole 40 mg once daily.        Endocrine   Diabetic neuropathy, type II diabetes mellitus ([place]) (Chronic)    At goal based on A1c from yesterday at 6.8. If patient is going to see Dr. Meredith Pel regularly she needs to see her more than annually.  Her last visit I see is January 2022. If patient returns she needs to have a microalbumin done in our office. Continue use of continuous glucose monitor. Continue current medications. Hopefully Lyrica will help her neuropathy.      Acquired hypothyroidism    Check TSH. Patient is currently on no thyroid replacement. TSH came back within the normal range.  Will remain off thyroid medications.      Relevant Orders   TSH (Completed)     Other   Severe obesity with body mass index (BMI) of 35.0 to 39.9 with serious comorbidity ([place])    Recommend continue to work on eating healthy diet and exercise. comorbidities include hyperlipidemia and diabetes.        Mixed hyperlipidemia    Poorly controlled  Recommend start on  crestor 20 mg once daily.      Relevant Medications   rivaroxaban (XARELTO) 20 MG TABS tablet   Other Relevant Orders   Lipid panel (Completed)   Moderate recurrent major depression ([place])    Recommend start on wellbutrin xl 150 mg once daily in am x 1 week, then increase to 150 mg 2 in am.     .  No orders of the defined types were placed in this encounter.   Orders Placed This Encounter  Procedures   CBC with Differential/Platelet   Comprehensive metabolic panel   Lipid panel   TSH      Follow-up: Return in about 4 weeks (around 07/31/2021).  An After Visit Summary was printed and given to the patient.  Rochel Brome, MD [facility] [phone]

## 2021-07-03 NOTE — Telephone Encounter (Signed)
I spoke to the patient. She verbalized understanding of her lab results. She was agreeable to start the recommended iron supplement.

## 2021-07-04 LAB — CBC WITH DIFFERENTIAL/PLATELET
Basophils Absolute: 0.1 10*3/uL (ref 0.0–0.2)
Basos: 1 %
EOS (ABSOLUTE): 0.2 10*3/uL (ref 0.0–0.4)
Eos: 2 %
Hematocrit: 40.4 % (ref 34.0–46.6)
Hemoglobin: 13 g/dL (ref 11.1–15.9)
Immature Grans (Abs): 0 10*3/uL (ref 0.0–0.1)
Immature Granulocytes: 0 %
Lymphocytes Absolute: 2.7 10*3/uL (ref 0.7–3.1)
Lymphs: 26 %
MCH: 25.2 pg — ABNORMAL LOW (ref 26.6–33.0)
MCHC: 32.2 g/dL (ref 31.5–35.7)
MCV: 78 fL — ABNORMAL LOW (ref 79–97)
Monocytes Absolute: 0.8 10*3/uL (ref 0.1–0.9)
Monocytes: 8 %
Neutrophils Absolute: 6.5 10*3/uL (ref 1.4–7.0)
Neutrophils: 63 %
Platelets: 310 10*3/uL (ref 150–450)
RBC: 5.15 x10E6/uL (ref 3.77–5.28)
RDW: 15.5 % — ABNORMAL HIGH (ref 11.7–15.4)
WBC: 10.2 10*3/uL (ref 3.4–10.8)

## 2021-07-04 LAB — LIPID PANEL
Chol/HDL Ratio: 5.4 ratio — ABNORMAL HIGH (ref 0.0–4.4)
Cholesterol, Total: 267 mg/dL — ABNORMAL HIGH (ref 100–199)
HDL: 49 mg/dL (ref >39)
LDL Chol Calc (NIH): 188 mg/dL — ABNORMAL HIGH (ref 0–99)
Triglycerides: 163 mg/dL — ABNORMAL HIGH (ref 0–149)
VLDL Cholesterol Cal: 30 mg/dL (ref 5–40)

## 2021-07-04 LAB — COMPREHENSIVE METABOLIC PANEL
ALT: 12 IU/L (ref 0–32)
AST: 16 IU/L (ref 0–40)
Albumin/Globulin Ratio: 1.6 (ref 1.2–2.2)
Albumin: 4.6 g/dL (ref 3.7–4.7)
Alkaline Phosphatase: 92 IU/L (ref 44–121)
BUN/Creatinine Ratio: 16 (ref 12–28)
BUN: 13 mg/dL (ref 8–27)
Bilirubin Total: 0.3 mg/dL (ref 0.0–1.2)
CO2: 23 mmol/L (ref 20–29)
Calcium: 9.8 mg/dL (ref 8.7–10.3)
Chloride: 102 mmol/L (ref 96–106)
Creatinine, Ser: 0.81 mg/dL (ref 0.57–1.00)
Globulin, Total: 2.9 g/dL (ref 1.5–4.5)
Glucose: 107 mg/dL — ABNORMAL HIGH (ref 70–99)
Potassium: 4.6 mmol/L (ref 3.5–5.2)
Sodium: 143 mmol/L (ref 134–144)
Total Protein: 7.5 g/dL (ref 6.0–8.5)
eGFR: 77 mL/min/{1.73_m2} (ref >59)

## 2021-07-04 LAB — TSH: TSH: 1.38 u[IU]/mL (ref 0.450–4.500)

## 2021-07-04 NOTE — Progress Notes (Signed)
Blood count normal.  Liver function normal.  Kidney function normal.  Thyroid function normal.  Cholesterol: LDL 188. Triglycerides 163 Recommend crestor 20 mg once daily.

## 2021-07-05 ENCOUNTER — Ambulatory Visit (INDEPENDENT_AMBULATORY_CARE_PROVIDER_SITE_OTHER): Payer: Medicare Other | Admitting: Cardiology

## 2021-07-05 ENCOUNTER — Encounter: Payer: Self-pay | Admitting: Cardiology

## 2021-07-05 ENCOUNTER — Other Ambulatory Visit: Payer: Self-pay

## 2021-07-05 VITALS — BP 138/82 | HR 90 | Ht 63.0 in | Wt 199.0 lb

## 2021-07-05 DIAGNOSIS — R112 Nausea with vomiting, unspecified: Secondary | ICD-10-CM | POA: Insufficient documentation

## 2021-07-05 DIAGNOSIS — Z7901 Long term (current) use of anticoagulants: Secondary | ICD-10-CM

## 2021-07-05 DIAGNOSIS — I499 Cardiac arrhythmia, unspecified: Secondary | ICD-10-CM | POA: Insufficient documentation

## 2021-07-05 DIAGNOSIS — I10 Essential (primary) hypertension: Secondary | ICD-10-CM | POA: Diagnosis not present

## 2021-07-05 DIAGNOSIS — I483 Typical atrial flutter: Secondary | ICD-10-CM

## 2021-07-05 DIAGNOSIS — F32A Depression, unspecified: Secondary | ICD-10-CM | POA: Insufficient documentation

## 2021-07-05 DIAGNOSIS — J45909 Unspecified asthma, uncomplicated: Secondary | ICD-10-CM | POA: Insufficient documentation

## 2021-07-05 DIAGNOSIS — M1990 Unspecified osteoarthritis, unspecified site: Secondary | ICD-10-CM | POA: Insufficient documentation

## 2021-07-05 DIAGNOSIS — I480 Paroxysmal atrial fibrillation: Secondary | ICD-10-CM

## 2021-07-05 NOTE — Progress Notes (Addendum)
Cardiology Office Note:    Date:  07/05/2021   ID:  Caitlin [name], DOB 1946/01/22, MRN [id number]  PCP:  Rochel Brome, MD  Cardiologist:  Shirlee More, MD   Referring MD: Rip Harbour, NP  ASSESSMENT:    1. Paroxysmal atrial fibrillation (HCC)   2. Typical atrial flutter ([place])   3. Chronic anticoagulation   4. Essential hypertension    PLAN:    In order of problems listed above:  In general she has done very well with her arrhythmia after multiple ablations she is maintaining sinus rhythm is not an antiarrhythmic drug is having no clinical recurrences of fibrillation flutter or atrial tachycardia and is now back on her antiarrhythmic drug.  She asked me if she needs to take diltiazem I told her I do not think so but I told her if she gets rapid heart rate that she could take her prescription she has at home as needed.  If she requires an antiarrhythmic drug with further interventional procedures she will see EP with another practice.  She is assured me she will continue her current anticoagulant. Hypertension stable BP at target continue ACE inhibitor Other medical care is stable including her diabetes managed by her PCP.  Next appointment 6 months   Medication Adjustments/Labs and Tests Ordered: Current medicines are reviewed at length with the patient today.  Concerns regarding medicines are outlined above.  Orders Placed This Encounter  Procedures   EKG 12-Lead   No orders of the defined types were placed in this encounter.     Chief complaint: I am here to establish cardiology.  History of Present Illness:    Caitlin [name] is a 75 y.o. female with a history of atrial fibrillation atrial flutter hypertension hyperlipidemia type 2 diabetes COPD obstructive sleep apnea and dementia who is being seen today to establish cardiology care at the request of Rip Harbour, NP.  Unfortunately despite very good care of she is dissatisfied with system she has not seen by  the electrophysiologist was given the impression that she needed to be seen after she is to monitor and wants to have a different interaction at a local level. Fortunately she is doing very well she is not having rapid heart rhythms palpitation shortness of breath chest pain or syncope. She had stopped her anticoagulant she was so frustrated with her cardiology care with her neurologist convinced her to go back on it and she tolerates well without any clinical bleeding. She understands I am not an electrophysiologist and agrees to see my partners if she needs further interventional antiarrhythmic drug therapy. She was seen in the emergency room [facility] 06/16/2021 ED note says that she was there because of chest pain cough and shortness of breath.  She had a CT of the chest performed which showed a small hiatal hernia coronary artery calcification aortic atherosclerosis.  Her EKG was described as sinus rhythm 91 bpm and normal.  ED staff noted that there is no indication of acute coronary syndrome and that the patient had stopped taking her anticoagulant about a month before that ED visit.  They felt her symptoms were due to COPD they gave her steroids Decadron and treated as an exacerbation of COPD and also arrange for nebulizer therapy.  She was discharged to follow-up with her primary care physician.  She was seen by her PCP Dr. Sheffield Slider 07/03/2021 in follow-up.  She was placed back on her anticoagulant at that time.  She was seen by [facility] cardiology by physicians assistant 11/08/2019. She has a history of atrial flutter with ablation 2014 2017 as well as atrial fibrillation ablation in 2017 having failed antiarrhythmic therapy with sotalol intolerant of amiodarone, long-term anticoagulation hypertension sleep apnea type 2 diabetes sinus tachycardia treated with rate limiting calcium channel blocker and Xarelto and iron deficiency anemia.  At that time her EKG  showed she was in sinus rhythm.  She was seen by electrophysiology Dr. Adrian Prows last in the office 0 03/25/2018 her history includes pulmonary vein isolation and ablation of left atrial flutter 2015 subsequent repeat ablation October 2017 she was maintained on flecainide which was subsequently discontinued and at that time he was seen by air and have been reinstituted with episodes of atrial tachycardia.  Utilized an event monitor in August 2022 I cannot see the results in Care Everywhere. She had an echocardiogram performed [facility] 12/13/2019 Mild concentric LVH normal left ventricular size normal systolic function EF 60 to 39% diastolic function was described as normal both atria are normal in size there is mild mitral and calcification without stenosis or regurgitation.  Recent labs [facility] 06/16/2021 potassium 4.1 sodium 141 creatinine 0.84 GFR 73 cc.  She had troponin assessed at that time it was normal hemoglobin 11.9 mildly diminished and microcytic consistent with iron deficiency anemia   07/05/2021: I received a copy of her event monitor from 04/04/2021 she has no episodes of atrial fibrillation or flutter and has frequent episodes of brief atrial tachycardia 1-22 beats average rate 117 bpm. Past Medical History:  Diagnosis Date   Arthritis    Asthma    Atrial fibrillation (HCC)    Cauda equina syndrome ([place]) 7/67/3419   Complication of anesthesia    COPD (chronic obstructive pulmonary disease) ([place])    Depression    Diabetes mellitus without complication (HCC)    Type II   Diabetic neuropathy, type II diabetes mellitus ([place]) 12/13/2019   Dysrhythmia    GERD (gastroesophageal reflux disease)    HTN (hypertension)    Hypercholesteremia    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[place]) 12/13/2019   Morbid obesity ([place]) 12/13/2019   Obstructive sleep apnea    PONV (postoperative nausea and vomiting)    "not the last few times"   RLS  (restless legs syndrome)    Sleep apnea     Past Surgical History:  Procedure Laterality Date   ATRIAL FIBRILLATION ABLATION     x3   COLONOSCOPY  01/04/2013   Moderate predominantly sigmoid diverticulosis. Small internal hemorrhoids. Otherwise normal colonosopy.    LUMBAR WOUND DEBRIDEMENT N/A 08/09/2019   Procedure: LUMBAR WOUND DEBRIDEMENT Evacuation of  EPIDURAL HEMATOMA;  Surgeon: Newman Pies, MD;  Location: [place];  Service: Neurosurgery;  Laterality: N/A;    Current Medications: Current Meds  Medication Sig   albuterol (PROVENTIL) (2.5 MG/3ML) 0.083% nebulizer solution Take 3 mLs (2.5 mg total) by nebulization every 6 (six) hours as needed for wheezing or shortness of breath.   budesonide-formoterol (SYMBICORT) 80-4.5 MCG/ACT inhaler Inhale 2 puffs into the lungs 2 (two) times daily.   calcium carbonate (TUMS - DOSED IN MG ELEMENTAL CALCIUM) 500 MG chewable tablet Chew 2 tablets by mouth daily as needed for indigestion.    Calcium-Vitamin D-Vitamin K 500-100-40 MG-UNT-MCG CHEW Chew 1 tablet by mouth at bedtime.   Continuous Blood Gluc Receiver (FREESTYLE LIBRE 14 DAY READER) DEVI USE TO CHECK BLOOD SUGARS DAILY   Continuous Blood  Gluc Sensor (FREESTYLE LIBRE 14 DAY SENSOR) MISC SMARTSIG:1 Topical Every 2 Weeks   Ferrous Sulfate (SLOW RELEASE IRON PO) Take by mouth daily with supper.   fexofenadine (ALLEGRA) 180 MG tablet Take 180 mg by mouth daily as needed for allergies or rhinitis.   Insulin Degludec (TRESIBA FLEXTOUCH) 200 UNIT/ML SOPN Inject 70 Units into the skin every evening.    Insulin Pen Needle 31G X 8 MM MISC Inject 1 Units into the skin daily.   Insulin Pen Needle 32G X 4 MM MISC 1 each by Does not apply route in the morning and at bedtime.   lisinopril (ZESTRIL) 40 MG tablet Take 1 tablet (40 mg total) by mouth daily.   omeprazole (PRILOSEC) 40 MG capsule Take 40 mg by mouth daily.   oxybutynin (DITROPAN XL) 10 MG 24 hr tablet Take 1 tablet (10 mg total) by mouth  at bedtime.   pregabalin (LYRICA) 100 MG capsule Take 1 capsule (100 mg total) by mouth 3 (three) times daily.   rivaroxaban (XARELTO) 20 MG TABS tablet Take by mouth at bedtime.   Semaglutide,0.25 or 0.5MG /DOS, 2 MG/1.5ML SOPN Inject 0.5 mg into the skin every Monday.    VITAMIN D-VITAMIN K PO Take 1 tablet by mouth every evening.     Allergies:   Codeine   Social History   Socioeconomic History   Marital status: Married    Spouse name: Not on file   Number of children: Not on file   Years of education: Not on file   Highest education level: Not on file  Occupational History   Not on file  Tobacco Use   Smoking status: Never    Passive exposure: Past   Smokeless tobacco: Never  Vaping Use   Vaping Use: Never used  Substance and Sexual Activity   Alcohol use: Not Currently   Drug use: Never   Sexual activity: Not Currently  Other Topics Concern   Not on file  Social History Narrative   Not on file   Social Determinants of Health   Financial Resource Strain: Not on file  Food Insecurity: Not on file  Transportation Needs: Not on file  Physical Activity: Not on file  Stress: Not on file  Social Connections: Not on file     Family History: The patient's family history includes Breast cancer in her paternal grandmother; Breast cancer (age of onset: 65) in her mother; Cancer in her brother; Colon cancer (age of onset: 64) in her paternal aunt; Heart Problems (age of onset: 25) in her brother; Heart attack (age of onset: 64) in her father; Mental illness in her sister.  ROS:   ROS Please see the history of present illness.     All other systems reviewed and are negative.  EKGs/Labs/Other Studies Reviewed:    The following studies were reviewed today:   EKG:  EKG is  ordered today.  The ekg ordered today is personally reviewed and demonstrates sinus rhythm possible preceding inferior MI however I think the Q waves are insignificant  Recent Labs: 07/03/2021: ALT 12;  BUN 13; Creatinine, Ser 0.81; Hemoglobin 13.0; Platelets 310; Potassium 4.6; Sodium 143; TSH 1.380  Recent Lipid Panel    Component Value Date/Time   CHOL 267 (H) 07/03/2021 0959   TRIG 163 (H) 07/03/2021 0959   HDL 49 07/03/2021 0959   CHOLHDL 5.4 (H) 07/03/2021 0959   LDLCALC 188 (H) 07/03/2021 0959    Physical Exam:    VS:  BP 138/82  Pulse 90   Ht 5\' 3"  (1.6 m)   Wt 199 lb (90.3 kg)   SpO2 99%   BMI 35.25 kg/m     Wt Readings from Last 3 Encounters:  07/05/21 199 lb (90.3 kg)  07/03/21 196 lb (88.9 kg)  07/02/21 199 lb 12.8 oz (90.6 kg)     GEN:  Well nourished, well developed in no acute distress HEENT: Normal NECK: No JVD; No carotid bruits LYMPHATICS: No lymphadenopathy CARDIAC: RRR, no murmurs, rubs, gallops RESPIRATORY:  Clear to auscultation without rales, wheezing or rhonchi  ABDOMEN: Soft, non-tender, non-distended MUSCULOSKELETAL:  No edema; No deformity  SKIN: Warm and dry NEUROLOGIC:  Alert and oriented x 3 PSYCHIATRIC:  Normal affect     Signed, Shirlee More, MD  07/05/2021 11:59 AM    [place]

## 2021-07-05 NOTE — Patient Instructions (Signed)

## 2021-07-14 ENCOUNTER — Encounter: Payer: Self-pay | Admitting: Family Medicine

## 2021-07-14 DIAGNOSIS — F330 Major depressive disorder, recurrent, mild: Secondary | ICD-10-CM | POA: Insufficient documentation

## 2021-07-14 DIAGNOSIS — F331 Major depressive disorder, recurrent, moderate: Secondary | ICD-10-CM | POA: Insufficient documentation

## 2021-07-14 DIAGNOSIS — I700 Atherosclerosis of aorta: Secondary | ICD-10-CM | POA: Insufficient documentation

## 2021-07-14 DIAGNOSIS — E782 Mixed hyperlipidemia: Secondary | ICD-10-CM | POA: Insufficient documentation

## 2021-07-14 NOTE — Assessment & Plan Note (Addendum)
Recommend continue to work on eating healthy diet and exercise. comorbidities include hyperlipidemia and diabetes.

## 2021-07-14 NOTE — Assessment & Plan Note (Signed)
Well-controlled.  Continue lisinopril 40 mg once daily.

## 2021-07-14 NOTE — Assessment & Plan Note (Signed)
Needs to be on a statin. LDL very high.  Recommended crestor 20 mg once daily.

## 2021-07-14 NOTE — Assessment & Plan Note (Signed)
At goal based on A1c from yesterday at 6.8. If patient is going to see Dr. Meredith Pel regularly she needs to see her more than annually.  Her last visit I see is January 2022. If patient returns she needs to have a microalbumin done in our office. Continue use of continuous glucose monitor. Continue current medications. Hopefully Lyrica will help her neuropathy.

## 2021-07-14 NOTE — Assessment & Plan Note (Signed)
Check TSH. Patient is currently on no thyroid replacement. TSH came back within the normal range.  Will remain off thyroid medications.

## 2021-07-14 NOTE — Assessment & Plan Note (Signed)
Intolerant to cpap.  

## 2021-07-14 NOTE — Assessment & Plan Note (Addendum)
Xarelto 20 mg once daily. Atrial fibrillation is paroxysmal and has been rate controlled Patient is scheduled to see Dr. Bettina Gavia in December 2022.   She reports she has been unable to get into see Dr. Ola Spurr due to his busy schedule.

## 2021-07-14 NOTE — Assessment & Plan Note (Signed)
Poorly controlled  Recommend start on crestor 20 mg once daily.

## 2021-07-14 NOTE — Assessment & Plan Note (Signed)
Based on the ED report and lack of evidence of COPD on her CTA of her chest I believe that she had an asthma exacerbation. Agree with continuing Symbicort 2 puffs twice daily.

## 2021-07-14 NOTE — Assessment & Plan Note (Signed)
Recommend start on wellbutrin xl 150 mg once daily in am x 1 week, then increase to 150 mg 2 in am.

## 2021-07-14 NOTE — Assessment & Plan Note (Signed)
Continue omeprazole 40 mg once daily.

## 2021-07-18 ENCOUNTER — Telehealth: Payer: Self-pay

## 2021-07-18 MED ORDER — BUPROPION HCL ER (XL) 150 MG PO TB24: ORAL_TABLET | ORAL | 0 refills | Status: DC

## 2021-07-18 MED ORDER — ROSUVASTATIN CALCIUM 20 MG PO TABS: 20.0000 mg | ORAL_TABLET | Freq: Every day | ORAL | 0 refills | Status: DC

## 2021-07-18 NOTE — Telephone Encounter (Signed)
Called pt. Pt agreed to both; Wellbutrin and Crestor. Pharmacy: [facility].   Medications sent.   Caitlin [name] 07/18/21 8:33 AM

## 2021-07-18 NOTE — Telephone Encounter (Signed)
-----   Message from Rochel Brome, MD sent at 07/14/2021 11:01 PM EST ----- Regarding: recommendations. Recommend start on wellbutrin xl 150 mg once daily in am x 1 week, then increase to 150 mg 2 in am. I reviewed old records and pt previously tried cymbalta, buspirone, remeron, and ativan. If she prefers alternative to wellbutrin, please let me know.   Also pt was recommended to take crestor 20 mg once daily when labs came back, but she has not returned the call. Please check if this is okay. If she have issues with statins, please document in allergies and let me know so I can add it to her note. I would then recommend repatha (I saw in emds she was prescribed this, but I am unsure if she ever took it.)  kc

## 2021-07-23 ENCOUNTER — Other Ambulatory Visit: Payer: Self-pay | Admitting: Nurse Practitioner

## 2021-07-23 ENCOUNTER — Ambulatory Visit
Admission: RE | Admit: 2021-07-23 | Discharge: 2021-07-23 | Disposition: A | Discharge status: Home / Self Care | Payer: Medicare Other | Source: Ambulatory Visit | Attending: Neurology | Admitting: Neurology

## 2021-07-23 DIAGNOSIS — N39490 Overflow incontinence: Secondary | ICD-10-CM

## 2021-07-23 DIAGNOSIS — G2581 Restless legs syndrome: Secondary | ICD-10-CM

## 2021-07-23 DIAGNOSIS — R269 Unspecified abnormalities of gait and mobility: Secondary | ICD-10-CM

## 2021-07-23 DIAGNOSIS — M542 Cervicalgia: Secondary | ICD-10-CM | POA: Diagnosis not present

## 2021-07-24 ENCOUNTER — Telehealth: Payer: Self-pay | Admitting: Neurology

## 2021-07-24 NOTE — Telephone Encounter (Signed)
  IMPRESSION: This MRI of the cervical spine without contrast shows the following: 1.   At C3-C4, there is moderate spinal stenosis due to degenerative changes and also cause moderately severe left and moderate right foraminal narrowing.  There is potential for left C4 nerve root compression. 2.   At C4-C5, there is mild spinal stenosis and moderate foraminal narrowing but no nerve root compression. 3.   At C5-C6, there is moderate spinal stenosis and moderately severe bilateral foraminal narrowing due to disc herniation and other degenerative changes.  There is potential for C6 nerve root compression to either side. 4.   At C6-C7, there is moderate spinal stenosis and mild to moderate bilateral foraminal narrowing but no nerve root compression. 5.   At C7-T1, there is mild spinal stenosis due to anterolisthesis and other degenerative change.  There is no nerve root compression. 6.   The spinal cord appears normal.    Please call patient, MRI of cervical spine showed multiple degenerative changes, most noticeable at moderate spinal stenosis at C3-4, C5-6, C6-7  If patient denies sudden change it is okay to keep her appointment in January, if she has worsening neck pain, gait abnormality, may move up her appointment to review MRIs,

## 2021-07-24 NOTE — Telephone Encounter (Signed)
I spoke to the patient and reviewed her MRI results. Pain has worsening but gait is better. She would like to come in earlier to review in more detail. Appt moved to 08/14/21 with Dr. Krista Blue.

## 2021-07-24 NOTE — Telephone Encounter (Signed)
Left message in both patient and husband's voicemail boxes.

## 2021-07-25 ENCOUNTER — Other Ambulatory Visit: Payer: Self-pay | Admitting: Nurse Practitioner

## 2021-07-25 DIAGNOSIS — N39490 Overflow incontinence: Secondary | ICD-10-CM

## 2021-07-27 ENCOUNTER — Telehealth: Payer: Self-pay | Admitting: Pharmacist

## 2021-07-27 DIAGNOSIS — G720 Drug-induced myopathy: Secondary | ICD-10-CM

## 2021-07-27 NOTE — Progress Notes (Signed)
  Per review of chart and payor information, patient has a diagnosis of diabetes but is not currently filling a statin prescription.  This places patient into the SUPD (Statin Use In Patients with Diabetes) measure for CMS.    Rosuvastatin 20 mg was sent to the patient's pharmacy but it was not picked up.  Patient was called. HIPAA identifiers were obtained.  Patient reported rosuvastatin causes leg pain and worsens her restless leg syndrome symptoms.  10-year ASCVD risk:  %  The 10-year ASCVD risk score (Arnett DK, et al., 2019) is: 36.3%   Values used to calculate the score:     Age: 75 years     Sex: Female     Is Non-Hispanic African American: No     Diabetic: Yes     Tobacco smoker: No     Systolic Blood Pressure: 998 mmHg     Is BP treated: Yes     HDL Cholesterol: 49 mg/dL     Total Cholesterol: 267 mg/dL Last LDL:   mg/dL   Please consider the following recommendations:    1) If intolerance is a concern, could consider a once, twice or three times weekly regimen as follows:  -Once weekly #13 (90DS) #3 refills  -Twice weekly #26 (90DS) 3 refills  -Three times weekly #39 (90DS) #3 refills   -----------------------------------------------------------------------------------   2) If appropriate, consider adding one the following SUPD exclusion CPT codes.  This is not an all inclusive list.    **Exclusion Code Requirements:**     1) Provider must add exclusion code to problem list during current calendar year     2) Provider must associate code during an encounter (in person or virtual) during current calendar year       Myopathy unspecified (G72.9)  Other specified myopathies (G72.89)  Drug induced myopathy (G72.0)  Myositis, unspecified (M60.9)  Adverse effect of antihyperlidipemic and antiarteriosclerotic drugs, initial encounter (P38.2N0N)   Plan: Route note to Dr. Tobie Poet prior to the 07/31/21 provider appointment.  Elayne Guerin, PharmD, [facility]  Pharmacist [phone]

## 2021-07-31 ENCOUNTER — Ambulatory Visit: Payer: Medicare Other | Admitting: Family Medicine

## 2021-07-31 ENCOUNTER — Other Ambulatory Visit: Payer: Self-pay

## 2021-07-31 ENCOUNTER — Encounter: Payer: Self-pay | Admitting: Family Medicine

## 2021-07-31 VITALS — BP 140/100 | HR 88 | Temp 98.8°F | Resp 18 | Ht 63.0 in | Wt 201.0 lb

## 2021-07-31 DIAGNOSIS — H6121 Impacted cerumen, right ear: Secondary | ICD-10-CM

## 2021-07-31 DIAGNOSIS — F331 Major depressive disorder, recurrent, moderate: Secondary | ICD-10-CM | POA: Diagnosis not present

## 2021-07-31 DIAGNOSIS — J45909 Unspecified asthma, uncomplicated: Secondary | ICD-10-CM | POA: Diagnosis not present

## 2021-07-31 MED ORDER — BREZTRI AEROSPHERE 160-9-4.8 MCG/ACT IN AERO: 2.0000 | INHALATION_SPRAY | Freq: Two times a day (BID) | RESPIRATORY_TRACT | 3 refills | Status: DC

## 2021-07-31 MED ORDER — ALBUTEROL SULFATE HFA 108 (90 BASE) MCG/ACT IN AERS: 2.0000 | INHALATION_SPRAY | Freq: Four times a day (QID) | RESPIRATORY_TRACT | 3 refills | Status: DC | PRN

## 2021-07-31 NOTE — Progress Notes (Signed)
Subjective:  Patient ID: Caitlin [name], female    DOB: 07-30-1946  Age: 75 y.o. MRN: [id number]  Chief Complaint  Patient presents with   Depression   COPD    HPI Presents for follow up of depression: just started wellbutrin xl 150 mg once in am one week ago.  Cannot tell if it is helping. She was supposed to have started it 3-4 weeks ago. PHQ9 SCORE ONLY 06/26/2021 06/26/2021 10/10/2020  PHQ-9 Total Score 12 0 0    Asthma: on symbicort 80/4.5 2 puffs bid. Has albuterol hfa, but needs a new nebulizer machine for her albuterol nebulizers.   Current Outpatient Medications on File Prior to Visit  Medication Sig Dispense Refill   albuterol (PROVENTIL) (2.5 MG/3ML) 0.083% nebulizer solution Take 3 mLs (2.5 mg total) by nebulization every 6 (six) hours as needed for wheezing or shortness of breath. 75 mL 0   buPROPion (WELLBUTRIN XL) 150 MG 24 hr tablet Take one tablet in morning for one week then increase to two tablets. 180 tablet 0   calcium carbonate (TUMS - DOSED IN MG ELEMENTAL CALCIUM) 500 MG chewable tablet Chew 2 tablets by mouth daily as needed for indigestion.      Calcium-Vitamin D-Vitamin K 500-100-40 MG-UNT-MCG CHEW Chew 1 tablet by mouth at bedtime.     Continuous Blood Gluc Receiver (FREESTYLE LIBRE 14 DAY READER) DEVI USE TO CHECK BLOOD SUGARS DAILY     Continuous Blood Gluc Sensor (FREESTYLE LIBRE 14 DAY SENSOR) MISC SMARTSIG:1 Topical Every 2 Weeks     Ferrous Sulfate (SLOW RELEASE IRON PO) Take by mouth daily with supper.     fexofenadine (ALLEGRA) 180 MG tablet Take 180 mg by mouth daily as needed for allergies or rhinitis.     Insulin Degludec (TRESIBA FLEXTOUCH) 200 UNIT/ML SOPN Inject 70 Units into the skin every evening.      Insulin Pen Needle 31G X 8 MM MISC Inject 1 Units into the skin daily.     Insulin Pen Needle 32G X 4 MM MISC 1 each by Does not apply route in the morning and at bedtime. 100 each 2   lisinopril (ZESTRIL) 40 MG tablet Take 1 tablet (40 mg total)  by mouth daily. 90 tablet 1   omeprazole (PRILOSEC) 40 MG capsule Take 40 mg by mouth daily.     oxybutynin (DITROPAN-XL) 10 MG 24 hr tablet TAKE 1 TABLET BY MOUTH EVERYDAY AT BEDTIME 90 tablet 1   pregabalin (LYRICA) 100 MG capsule Take 1 capsule (100 mg total) by mouth 3 (three) times daily. 90 capsule 5   rivaroxaban (XARELTO) 20 MG TABS tablet Take by mouth at bedtime.     rosuvastatin (CRESTOR) 20 MG tablet Take 1 tablet (20 mg total) by mouth daily. 90 tablet 0   Semaglutide,0.25 or 0.5MG /DOS, 2 MG/1.5ML SOPN Inject 0.5 mg into the skin every Monday.      VITAMIN D-VITAMIN K PO Take 1 tablet by mouth every evening.     No current facility-administered medications on file prior to visit.   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 Small internal hemorrhoids. Otherwise normal colonosopy.    LUMBAR WOUND DEBRIDEMENT N/A 08/09/2019   Procedure: LUMBAR WOUND DEBRIDEMENT Evacuation of  EPIDURAL HEMATOMA;  Surgeon:  Newman Pies, MD;  Location: [facility];  Service: Neurosurgery;  Laterality: N/A;    Family History  Problem Relation Age of Onset   Breast cancer Mother 21   Heart attack Father 46   Mental illness Sister    Heart Problems Brother 10   Cancer Brother    Breast cancer Paternal Grandmother    Colon cancer Paternal Aunt 87   Social History   Socioeconomic History   Marital status: Married    Spouse name: Not on file   Number of children: Not on file   Years of education: Not on file   Highest education level: Not on file  Occupational History   Not on file  Tobacco Use   Smoking status: Never    Passive exposure: Past   Smokeless tobacco: Never  Vaping Use   Vaping Use: Never used  Substance and Sexual Activity   Alcohol use: Not Currently   Drug use: Never   Sexual activity: Not Currently  Other Topics Concern   Not on file  Social History Narrative   Not on file   Social Determinants of Health   Financial Resource Strain: Not on file  Food Insecurity: Not on file  Transportation Needs: Not on file  Physical Activity: Not on file  Stress: Not on file  Social Connections: Not on file    Review of Systems  Constitutional:  Positive for fatigue. Negative for chills and fever.  HENT:  Positive for ear pain. Negative for congestion, rhinorrhea and sore throat.   Respiratory:  Positive for cough and shortness of breath.   Cardiovascular:  Negative for chest pain.  Gastrointestinal:  Negative for abdominal pain, constipation, diarrhea, nausea and vomiting.  Genitourinary:  Positive for frequency. Negative for dysuria and urgency.  Musculoskeletal:  Positive for arthralgias. Negative for back pain and myalgias.  Neurological:  Positive for dizziness. Negative for weakness, light-headedness and headaches.  Psychiatric/Behavioral:  Positive for dysphoric mood. The patient is not nervous/anxious.     Objective:  BP (!) 140/100    Pulse 88    Temp 98.8 F (37.1 C)     Resp 18    Ht 5\' 3"  (1.6 m)    Wt 201 lb (91.2 kg)    BMI 35.61 kg/m   BP/Weight 07/31/2021 07/05/2021 22/09/5425  Systolic BP 062 376 283  Diastolic BP 151 82 82  Wt. (Lbs) 201 199 196  BMI 35.61 35.25 34.72    Physical Exam Vitals reviewed.  Constitutional:      Appearance: Normal appearance. She is normal weight.  HENT:     Right Ear: There is impacted cerumen.  Left Ear: Tympanic membrane, ear canal and external ear normal.     Nose: Nose normal.     Mouth/Throat:     Pharynx: Oropharynx is clear.  Neck:     Vascular: No carotid bruit.  Cardiovascular:     Rate and Rhythm: Normal rate and regular rhythm.     Pulses: Normal pulses.     Heart sounds: Normal heart sounds. No murmur heard. Pulmonary:     Effort: Pulmonary effort is normal. No respiratory distress.     Breath sounds: Normal breath sounds.  Abdominal:     General: Abdomen is flat. Bowel sounds are normal.     Palpations: Abdomen is soft.     Tenderness: There is no abdominal tenderness.  Neurological:     Mental Status: She is alert and oriented to person, place, and time.  Psychiatric:        Mood and Affect: Mood normal.        Behavior: Behavior normal.    Diabetic Foot Exam - Simple   No data filed      Lab Results  Component Value Date   WBC 10.2 07/03/2021   HGB 13.0 07/03/2021   HCT 40.4 07/03/2021   PLT 310 07/03/2021   GLUCOSE 107 (H) 07/03/2021   CHOL 267 (H) 07/03/2021   TRIG 163 (H) 07/03/2021   HDL 49 07/03/2021   LDLCALC 188 (H) 07/03/2021   ALT 12 07/03/2021   AST 16 07/03/2021   NA 143 07/03/2021   K 4.6 07/03/2021   CL 102 07/03/2021   CREATININE 0.81 07/03/2021   BUN 13 07/03/2021   CO2 23 07/03/2021   TSH 1.380 07/03/2021   INR 0.9 08/04/2019   HGBA1C 6.8 (H) 07/02/2021      Assessment & Plan:   Problem List Items Addressed This Visit       Respiratory   Asthma - Primary    Stop symbicort.  Start breztri 2 puffs twice a day. Try sample first, then get  prescription. Sent albuterol inhaler 2 puffs four times a day as needed for shortness of breath.  I will send for albuterol nebulizer machines      Relevant Medications   Budeson-Glycopyrrol-Formoterol (BREZTRI AEROSPHERE) 160-9-4.8 MCG/ACT AERO   albuterol (VENTOLIN HFA) 108 (90 Base) MCG/ACT inhaler     Nervous and Auditory   Impacted cerumen of right ear    Recommend debrox        Other   Moderate recurrent major depression (HCC)    Continue wellbutrin xl 150 mg once daily x 1 week, then increase 2 daily in am.      .  Meds ordered this encounter  Medications   Budeson-Glycopyrrol-Formoterol (BREZTRI AEROSPHERE) 160-9-4.8 MCG/ACT AERO    Sig: Inhale 2 puffs into the lungs in the morning and at bedtime.    Dispense:  10.7 g    Refill:  3   albuterol (VENTOLIN HFA) 108 (90 Base) MCG/ACT inhaler    Sig: Inhale 2 puffs into the lungs every 6 (six) hours as needed for wheezing or shortness of breath.    Dispense:  8 g    Refill:  3    No orders of the defined types were placed in this encounter.    I,Britnie Colville,acting as a Education administrator for Rochel Brome, MD.,have documented all relevant documentation on the behalf of Rochel Brome, MD,as directed by  Rochel Brome, MD while in the presence of Rochel Brome, MD.   Follow-up: Return in about 3  weeks (around 08/21/2021) for depression.  An After Visit Summary was printed and given to the patient.  Rochel Brome, MD [facility] [phone]

## 2021-07-31 NOTE — Patient Instructions (Signed)
Asthma: Stop symbicort.  Start breztri 2 puffs twice a day. Try sample first, then get prescription. Sent albuterol inhaler 2 puffs four times a day as needed for shortness of breath.  I will send for albuterol nebulizer machines  Depression:  Continue wellbutrin xl 150 mg once daily x 1 week, then increase 2 daily in am.

## 2021-08-01 NOTE — Assessment & Plan Note (Signed)
Continue wellbutrin xl 150 mg once daily x 1 week, then increase 2 daily in am.

## 2021-08-01 NOTE — Assessment & Plan Note (Signed)
Stop symbicort.  Start breztri 2 puffs twice a day. Try sample first, then get prescription. Sent albuterol inhaler 2 puffs four times a day as needed for shortness of breath.  I will send for albuterol nebulizer machines

## 2021-08-14 ENCOUNTER — Encounter: Payer: Self-pay | Admitting: Family Medicine

## 2021-08-14 ENCOUNTER — Telehealth (INDEPENDENT_AMBULATORY_CARE_PROVIDER_SITE_OTHER): Payer: Medicare Other | Admitting: Family Medicine

## 2021-08-14 ENCOUNTER — Telehealth: Payer: Self-pay | Admitting: Neurology

## 2021-08-14 ENCOUNTER — Ambulatory Visit: Payer: Medicare Other | Admitting: Neurology

## 2021-08-14 DIAGNOSIS — U071 COVID-19: Secondary | ICD-10-CM

## 2021-08-14 DIAGNOSIS — J069 Acute upper respiratory infection, unspecified: Secondary | ICD-10-CM

## 2021-08-14 HISTORY — DX: Acute upper respiratory infection, unspecified: J06.9

## 2021-08-14 HISTORY — DX: COVID-19: U07.1

## 2021-08-14 MED ORDER — MOLNUPIRAVIR EUA 200MG CAPSULE: 4.0000 | ORAL_CAPSULE | Freq: Two times a day (BID) | ORAL | 0 refills | Status: AC

## 2021-08-14 NOTE — Progress Notes (Signed)
Virtual Visit via Telephone Note   This visit type was conducted due to national recommendations for restrictions regarding the COVID-19 Pandemic (e.g. social distancing) in an effort to limit this patient's exposure and mitigate transmission in our community.  Due to her co-morbid illnesses, this patient is at least at moderate risk for complications without adequate follow up.  This format is felt to be most appropriate for this patient at this time.  The patient did not have access to video technology/had technical difficulties with video requiring transitioning to audio format only (telephone).  All issues noted in this document were discussed and addressed.  No physical exam could be performed with this format.  Patient verbally consented to a telehealth visit.   Date:  08/14/2021   ID:  Caitlin [name], DOB Mar 21, 1946, MRN [id number]  Patient Location: Home Provider Location: Office/Clinic  PCP:  Rochel Brome, MD   Evaluation Performed:  acute visit  Chief Complaint:  cough  History of Present Illness:    Caitlin [name] is a 75 y.o. female complaining of cough x 3 days. Covid 19 positive. Fever x 2 days. Taking tylenol which helps. Complaining of nasal congestion, nausea, sob, loss of taste and smell, headache. Using breztri 2 puffs twice a day and albuterol once a day, Drinking gatorade and water. Eating soup. Using theraflu   Sugars: 160s.  The patient does have symptoms concerning for COVID-19 infection (fever, chills, cough, or new shortness of breath).    Past Medical History:  Diagnosis Date   Arthritis    Asthma    Atrial fibrillation (HCC)    Cauda equina syndrome ([place]) 2/95/1884   Complication of anesthesia    COPD (chronic obstructive pulmonary disease) (HCC)    Depression    Diabetes mellitus without complication (HCC)    Type II   Diabetic neuropathy, type II diabetes mellitus ([place]) 12/13/2019   Dysrhythmia    GERD (gastroesophageal reflux disease)    HTN  (hypertension)    Hypercholesteremia    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[place]) 12/13/2019   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[place]) 12/13/2019   Morbid obesity ([place]) 12/13/2019   Obstructive sleep apnea    PONV (postoperative nausea and vomiting)    "not the last few times"   RLS (restless legs syndrome)    S/P ablation of atrial flutter 02/12/2016   S/P lumbar fusion 08/04/2019   Sleep apnea     Past Surgical History:  Procedure Laterality Date   ATRIAL FIBRILLATION ABLATION     x3   COLONOSCOPY  01/04/2013   Moderate predominantly sigmoid diverticulosis. Small internal hemorrhoids. Otherwise normal colonosopy.    LUMBAR WOUND DEBRIDEMENT N/A 08/09/2019   Procedure: LUMBAR WOUND DEBRIDEMENT Evacuation of  EPIDURAL HEMATOMA;  Surgeon: Newman Pies, MD;  Location: [facility];  Service: Neurosurgery;  Laterality: N/A;    Family History  Problem Relation Age of Onset   Breast cancer Mother 1   Heart attack Father 42   Mental illness Sister    Heart Problems Brother 37   Cancer Brother    Breast cancer Paternal Grandmother    Colon cancer Paternal Aunt 16    Social History   Socioeconomic History   Marital status: Married    Spouse name: Not on file   Number of children: Not on file   Years of education: Not on file   Highest education level: Not on file  Occupational History   Not on file  Tobacco Use  Smoking status: Never    Passive exposure: Past   Smokeless tobacco: Never  Vaping Use   Vaping Use: Never used  Substance and Sexual Activity   Alcohol use: Not Currently   Drug use: Never   Sexual activity: Not Currently  Other Topics Concern   Not on file  Social History Narrative   Not on file   Social Determinants of Health   Financial Resource Strain: Not on file  Food Insecurity: Not on file  Transportation Needs: Not on file  Physical Activity: Not on file  Stress: Not on file  Social Connections: Not on  file  Intimate Partner Violence: Not on file    Outpatient Medications Prior to Visit  Medication Sig Dispense Refill   albuterol (PROVENTIL) (2.5 MG/3ML) 0.083% nebulizer solution Take 3 mLs (2.5 mg total) by nebulization every 6 (six) hours as needed for wheezing or shortness of breath. 75 mL 0   albuterol (VENTOLIN HFA) 108 (90 Base) MCG/ACT inhaler Inhale 2 puffs into the lungs every 6 (six) hours as needed for wheezing or shortness of breath. 8 g 3   Budeson-Glycopyrrol-Formoterol (BREZTRI AEROSPHERE) 160-9-4.8 MCG/ACT AERO Inhale 2 puffs into the lungs in the morning and at bedtime. 10.7 g 3   buPROPion (WELLBUTRIN XL) 150 MG 24 hr tablet Take one tablet in morning for one week then increase to two tablets. 180 tablet 0   calcium carbonate (TUMS - DOSED IN MG ELEMENTAL CALCIUM) 500 MG chewable tablet Chew 2 tablets by mouth daily as needed for indigestion.      Calcium-Vitamin D-Vitamin K 500-100-40 MG-UNT-MCG CHEW Chew 1 tablet by mouth at bedtime.     Continuous Blood Gluc Receiver (FREESTYLE LIBRE 14 DAY READER) DEVI USE TO CHECK BLOOD SUGARS DAILY     Continuous Blood Gluc Sensor (FREESTYLE LIBRE 14 DAY SENSOR) MISC SMARTSIG:1 Topical Every 2 Weeks     Ferrous Sulfate (SLOW RELEASE IRON PO) Take by mouth daily with supper.     fexofenadine (ALLEGRA) 180 MG tablet Take 180 mg by mouth daily as needed for allergies or rhinitis.     Insulin Degludec (TRESIBA FLEXTOUCH) 200 UNIT/ML SOPN Inject 70 Units into the skin every evening.      Insulin Pen Needle 31G X 8 MM MISC Inject 1 Units into the skin daily.     Insulin Pen Needle 32G X 4 MM MISC 1 each by Does not apply route in the morning and at bedtime. 100 each 2   lisinopril (ZESTRIL) 40 MG tablet Take 1 tablet (40 mg total) by mouth daily. 90 tablet 1   omeprazole (PRILOSEC) 40 MG capsule Take 40 mg by mouth daily.     oxybutynin (DITROPAN-XL) 10 MG 24 hr tablet TAKE 1 TABLET BY MOUTH EVERYDAY AT BEDTIME 90 tablet 1   pregabalin  (LYRICA) 100 MG capsule Take 1 capsule (100 mg total) by mouth 3 (three) times daily. 90 capsule 5   rivaroxaban (XARELTO) 20 MG TABS tablet Take by mouth at bedtime.     rosuvastatin (CRESTOR) 20 MG tablet Take 1 tablet (20 mg total) by mouth daily. 90 tablet 0   Semaglutide,0.25 or 0.5MG /DOS, 2 MG/1.5ML SOPN Inject 0.5 mg into the skin every Monday.      VITAMIN D-VITAMIN K PO Take 1 tablet by mouth every evening.     No facility-administered medications prior to visit.    Allergies:   Codeine   Social History   Tobacco Use   Smoking status: Never  Passive exposure: Past   Smokeless tobacco: Never  Vaping Use   Vaping Use: Never used  Substance Use Topics   Alcohol use: Not Currently   Drug use: Never     Review of Systems  Constitutional:  Positive for fever and malaise/fatigue.  HENT:  Positive for congestion and ear pain. Negative for sore throat.   Respiratory:  Positive for cough and shortness of breath.   Cardiovascular:  Negative for chest pain.    Labs/Other Tests and Data Reviewed:    Recent Labs: 07/03/2021: ALT 12; BUN 13; Creatinine, Ser 0.81; Hemoglobin 13.0; Platelets 310; Potassium 4.6; Sodium 143; TSH 1.380   Recent Lipid Panel Lab Results  Component Value Date/Time   CHOL 267 (H) 07/03/2021 09:59 AM   TRIG 163 (H) 07/03/2021 09:59 AM   HDL 49 07/03/2021 09:59 AM   CHOLHDL 5.4 (H) 07/03/2021 09:59 AM   LDLCALC 188 (H) 07/03/2021 09:59 AM    Wt Readings from Last 3 Encounters:  07/31/21 201 lb (91.2 kg)  07/05/21 199 lb (90.3 kg)  07/03/21 196 lb (88.9 kg)     Objective:    Vital Signs:  There were no vitals taken for this visit.   Physical Exam   ASSESSMENT & PLAN:    Problem List Items Addressed This Visit       Respiratory   Upper respiratory tract infection due to COVID-19 virus - Primary    Rx: molnupirivir.  You should remain isolated and quarantine for at least 5 days from start of symptoms. You must be feeling better and be  fever free without any fever reducers for at least 24 hours as well. You should wear a mask at all times when out of your home or around others for 5 days after leaving isolation.  Your household contacts should be tested as well as work contacts. If you feel worse or have increasing shortness of breath, you should be seen in person at urgent care or the emergency room.         Relevant Medications   molnupiravir EUA (LAGEVRIO) 200 mg CAPS capsule     Meds ordered this encounter  Medications   molnupiravir EUA (LAGEVRIO) 200 mg CAPS capsule    Sig: Take 4 capsules (800 mg total) by mouth 2 (two) times daily for 5 days.    Dispense:  40 capsule    Refill:  0    I spent 9 minutes dedicated to the care of this patient on the date of this encounter via telehealth audio only. Pt has no access to a computer or a smart phone.   Follow Up:  In Person prn  Signed,  Rochel Brome, MD  08/14/2021 4:52 PM    [place]

## 2021-08-14 NOTE — Telephone Encounter (Signed)
FYI- pt has covid had to reschedule her appt to March.

## 2021-08-14 NOTE — Assessment & Plan Note (Signed)
Rx: molnupirivir.  You should remain isolated and quarantine for at least 5 days from start of symptoms. You must be feeling better and be fever free without any fever reducers for at least 24 hours as well. You should wear a mask at all times when out of your home or around others for 5 days after leaving isolation.  Your household contacts should be tested as well as work contacts. If you feel worse or have increasing shortness of breath, you should be seen in person at urgent care or the emergency room.

## 2021-08-15 ENCOUNTER — Ambulatory Visit: Payer: Medicare Other | Admitting: Cardiology

## 2021-08-15 ENCOUNTER — Telehealth: Payer: Medicare Other | Admitting: Family Medicine

## 2021-08-18 DIAGNOSIS — H6121 Impacted cerumen, right ear: Secondary | ICD-10-CM | POA: Insufficient documentation

## 2021-08-18 NOTE — Assessment & Plan Note (Signed)
Recommend debrox

## 2021-08-20 NOTE — Progress Notes (Signed)
Subjective:  Patient ID: Caitlin [name], female    DOB: 05/24/1946  Age: 76 y.o. MRN: [id number]  No chief complaint on file.   HPI   Current Outpatient Medications on File Prior to Visit  Medication Sig Dispense Refill   albuterol (PROVENTIL) (2.5 MG/3ML) 0.083% nebulizer solution Take 3 mLs (2.5 mg total) by nebulization every 6 (six) hours as needed for wheezing or shortness of breath. 75 mL 0   albuterol (VENTOLIN HFA) 108 (90 Base) MCG/ACT inhaler Inhale 2 puffs into the lungs every 6 (six) hours as needed for wheezing or shortness of breath. 8 g 3   Budeson-Glycopyrrol-Formoterol (BREZTRI AEROSPHERE) 160-9-4.8 MCG/ACT AERO Inhale 2 puffs into the lungs in the morning and at bedtime. 10.7 g 3   buPROPion (WELLBUTRIN XL) 150 MG 24 hr tablet Take one tablet in morning for one week then increase to two tablets. 180 tablet 0   calcium carbonate (TUMS - DOSED IN MG ELEMENTAL CALCIUM) 500 MG chewable tablet Chew 2 tablets by mouth daily as needed for indigestion.      Calcium-Vitamin D-Vitamin K 500-100-40 MG-UNT-MCG CHEW Chew 1 tablet by mouth at bedtime.     Continuous Blood Gluc Receiver (FREESTYLE LIBRE 14 DAY READER) DEVI USE TO CHECK BLOOD SUGARS DAILY     Continuous Blood Gluc Sensor (FREESTYLE LIBRE 14 DAY SENSOR) MISC SMARTSIG:1 Topical Every 2 Weeks     Ferrous Sulfate (SLOW RELEASE IRON PO) Take by mouth daily with supper.     fexofenadine (ALLEGRA) 180 MG tablet Take 180 mg by mouth daily as needed for allergies or rhinitis.     Insulin Degludec (TRESIBA FLEXTOUCH) 200 UNIT/ML SOPN Inject 70 Units into the skin every evening.      Insulin Pen Needle 31G X 8 MM MISC Inject 1 Units into the skin daily.     Insulin Pen Needle 32G X 4 MM MISC 1 each by Does not apply route in the morning and at bedtime. 100 each 2   lisinopril (ZESTRIL) 40 MG tablet Take 1 tablet (40 mg total) by mouth daily. 90 tablet 1   omeprazole (PRILOSEC) 40 MG capsule Take 40 mg by mouth daily.     oxybutynin  (DITROPAN-XL) 10 MG 24 hr tablet TAKE 1 TABLET BY MOUTH EVERYDAY AT BEDTIME 90 tablet 1   pregabalin (LYRICA) 100 MG capsule Take 1 capsule (100 mg total) by mouth 3 (three) times daily. 90 capsule 5   rivaroxaban (XARELTO) 20 MG TABS tablet Take by mouth at bedtime.     rosuvastatin (CRESTOR) 20 MG tablet Take 1 tablet (20 mg total) by mouth daily. 90 tablet 0   Semaglutide,0.25 or 0.5MG /DOS, 2 MG/1.5ML SOPN Inject 0.5 mg into the skin every Monday.      VITAMIN D-VITAMIN K PO Take 1 tablet by mouth every evening.     No current facility-administered medications on file prior to visit.   Past Medical History:  Diagnosis Date   Arthritis    Asthma    Atrial fibrillation ([place])    Cauda equina syndrome ([place]) 0/99/8338   Complication of anesthesia    COPD (chronic obstructive pulmonary disease) ([place])    Depression    Diabetes mellitus without complication (HCC)    Type II   Diabetic neuropathy, type II diabetes mellitus ([place]) 12/13/2019   Dysrhythmia    GERD (gastroesophageal reflux disease)    HTN (hypertension)    Hypercholesteremia    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[place]) 12/13/2019  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[place]) 12/13/2019   Morbid obesity ([place]) 12/13/2019   Obstructive sleep apnea    PONV (postoperative nausea and vomiting)    "not the last few times"   RLS (restless legs syndrome)    S/P ablation of atrial flutter 02/12/2016   S/P lumbar fusion 08/04/2019   Sleep apnea    Past Surgical History:  Procedure Laterality Date   ATRIAL FIBRILLATION ABLATION     x3   COLONOSCOPY  01/04/2013   Moderate predominantly sigmoid diverticulosis. Small internal hemorrhoids. Otherwise normal colonosopy.    LUMBAR WOUND DEBRIDEMENT N/A 08/09/2019   Procedure: LUMBAR WOUND DEBRIDEMENT Evacuation of  EPIDURAL HEMATOMA;  Surgeon: Newman Pies, MD;  Location: [facility];  Service: Neurosurgery;  Laterality: N/A;    Family History   Problem Relation Age of Onset   Breast cancer Mother 73   Heart attack Father 60   Mental illness Sister    Heart Problems Brother 42   Cancer Brother    Breast cancer Paternal Grandmother    Colon cancer Paternal Aunt 30   Social History   Socioeconomic History   Marital status: Married    Spouse name: Not on file   Number of children: Not on file   Years of education: Not on file   Highest education level: Not on file  Occupational History   Not on file  Tobacco Use   Smoking status: Never    Passive exposure: Past   Smokeless tobacco: Never  Vaping Use   Vaping Use: Never used  Substance and Sexual Activity   Alcohol use: Not Currently   Drug use: Never   Sexual activity: Not Currently  Other Topics Concern   Not on file  Social History Narrative   Not on file   Social Determinants of Health   Financial Resource Strain: Not on file  Food Insecurity: Not on file  Transportation Needs: Not on file  Physical Activity: Not on file  Stress: Not on file  Social Connections: Not on file    Review of Systems  Constitutional:  Negative for chills, fatigue and fever.  HENT:  Negative for congestion, ear pain and sore throat.   Respiratory:  Negative for cough and shortness of breath.   Cardiovascular:  Negative for chest pain and palpitations.  Gastrointestinal:  Negative for abdominal pain, constipation, diarrhea, nausea and vomiting.  Endocrine: Negative for polydipsia, polyphagia and polyuria.  Genitourinary:  Negative for difficulty urinating and dysuria.  Musculoskeletal:  Negative for arthralgias, back pain and myalgias.  Skin:  Negative for rash.  Neurological:  Negative for headaches.  Psychiatric/Behavioral:  Negative for dysphoric mood. The patient is not nervous/anxious.     Objective:  There were no vitals taken for this visit.  BP/Weight 07/31/2021 07/05/2021 23/53/6144  Systolic BP 315 400 867  Diastolic BP 619 82 82  Wt. (Lbs) 201 199 196   BMI 35.61 35.25 34.72    Physical Exam  Diabetic Foot Exam - Simple   No data filed      Lab Results  Component Value Date   WBC 10.2 07/03/2021   HGB 13.0 07/03/2021   HCT 40.4 07/03/2021   PLT 310 07/03/2021   GLUCOSE 107 (H) 07/03/2021   CHOL 267 (H) 07/03/2021   TRIG 163 (H) 07/03/2021   HDL 49 07/03/2021   LDLCALC 188 (H) 07/03/2021   ALT 12 07/03/2021   AST 16 07/03/2021   NA 143 07/03/2021   K 4.6 07/03/2021  CL 102 07/03/2021   CREATININE 0.81 07/03/2021   BUN 13 07/03/2021   CO2 23 07/03/2021   TSH 1.380 07/03/2021   INR 0.9 08/04/2019   HGBA1C 6.8 (H) 07/02/2021      Assessment & Plan:   Problem List Items Addressed This Visit       Other   Moderate recurrent major depression ([place]) - Primary  .  No orders of the defined types were placed in this encounter.   No orders of the defined types were placed in this encounter.    Follow-up: No follow-ups on file.  An After Visit Summary was printed and given to the patient.  Rochel Brome, MD [facility] [phone]

## 2021-08-21 ENCOUNTER — Other Ambulatory Visit: Payer: Self-pay

## 2021-08-21 ENCOUNTER — Ambulatory Visit (INDEPENDENT_AMBULATORY_CARE_PROVIDER_SITE_OTHER): Payer: Medicare Other | Admitting: Family Medicine

## 2021-08-21 VITALS — BP 126/76 | HR 88 | Temp 98.6°F | Resp 18 | Ht 63.0 in | Wt 198.0 lb

## 2021-08-21 DIAGNOSIS — Z794 Long term (current) use of insulin: Secondary | ICD-10-CM | POA: Diagnosis not present

## 2021-08-21 DIAGNOSIS — F331 Major depressive disorder, recurrent, moderate: Secondary | ICD-10-CM

## 2021-08-21 DIAGNOSIS — E1140 Type 2 diabetes mellitus with diabetic neuropathy, unspecified: Secondary | ICD-10-CM | POA: Diagnosis not present

## 2021-08-21 MED ORDER — BUPROPION HCL ER (XL) 300 MG PO TB24: ORAL_TABLET | ORAL | 1 refills | Status: DC

## 2021-08-21 NOTE — Progress Notes (Signed)
Subjective:  Patient ID: Caitlin [name], female    DOB: 1946/04/19  Age: 76 y.o. MRN: [id number]  Chief Complaint  Patient presents with   Depression   post covid   Fatigue    HPI Depression: on Wellbutrin xl 300 mg once in am. Doing fairlywell.   PHQ9 SCORE ONLY 08/21/2021 06/26/2021 06/26/2021  PHQ-9 Total Score 13 12 0   Pt also just got over covid 19. Pt has loss of stamina. Fatigue has improved a little. Sleeping better.  DM: doing great. Has CGM. Loves it. Last a1c was 6.1.   Current Outpatient Medications on File Prior to Visit  Medication Sig Dispense Refill   albuterol (PROVENTIL) (2.5 MG/3ML) 0.083% nebulizer solution Take 3 mLs (2.5 mg total) by nebulization every 6 (six) hours as needed for wheezing or shortness of breath. 75 mL 0   albuterol (VENTOLIN HFA) 108 (90 Base) MCG/ACT inhaler Inhale 2 puffs into the lungs every 6 (six) hours as needed for wheezing or shortness of breath. 8 g 3   Budeson-Glycopyrrol-Formoterol (BREZTRI AEROSPHERE) 160-9-4.8 MCG/ACT AERO Inhale 2 puffs into the lungs in the morning and at bedtime. 10.7 g 3   calcium carbonate (TUMS - DOSED IN MG ELEMENTAL CALCIUM) 500 MG chewable tablet Chew 2 tablets by mouth daily as needed for indigestion.      Calcium-Vitamin D-Vitamin K 500-100-40 MG-UNT-MCG CHEW Chew 1 tablet by mouth at bedtime.     Continuous Blood Gluc Receiver (FREESTYLE LIBRE 14 DAY READER) DEVI USE TO CHECK BLOOD SUGARS DAILY     Continuous Blood Gluc Sensor (FREESTYLE LIBRE 14 DAY SENSOR) MISC SMARTSIG:1 Topical Every 2 Weeks     Ferrous Sulfate (SLOW RELEASE IRON PO) Take by mouth daily with supper.     fexofenadine (ALLEGRA) 180 MG tablet Take 180 mg by mouth daily as needed for allergies or rhinitis.     Insulin Degludec (TRESIBA FLEXTOUCH) 200 UNIT/ML SOPN Inject 50 Units into the skin every evening.     Insulin Pen Needle 31G X 8 MM MISC Inject 1 Units into the skin daily.     Insulin Pen Needle 32G X 4 MM MISC 1 each by Does not  apply route in the morning and at bedtime. 100 each 2   lisinopril (ZESTRIL) 40 MG tablet Take 1 tablet (40 mg total) by mouth daily. 90 tablet 1   omeprazole (PRILOSEC) 40 MG capsule Take 40 mg by mouth daily.     oxybutynin (DITROPAN-XL) 10 MG 24 hr tablet TAKE 1 TABLET BY MOUTH EVERYDAY AT BEDTIME 90 tablet 1   pregabalin (LYRICA) 100 MG capsule Take 1 capsule (100 mg total) by mouth 3 (three) times daily. 90 capsule 5   rivaroxaban (XARELTO) 20 MG TABS tablet Take by mouth at bedtime.     rosuvastatin (CRESTOR) 20 MG tablet Take 1 tablet (20 mg total) by mouth daily. 90 tablet 0   Semaglutide,0.25 or 0.5MG /DOS, 2 MG/1.5ML SOPN Inject 0.5 mg into the skin every Monday.      VITAMIN D-VITAMIN K PO Take 1 tablet by mouth every evening.     No current facility-administered medications on file prior to visit.   Past Medical History:  Diagnosis Date   Arthritis    Asthma    Atrial fibrillation ([place])    Cauda equina syndrome ([place]) 2/45/8099   Complication of anesthesia    COPD (chronic obstructive pulmonary disease) (HCC)    Depression    Diabetes mellitus without complication (HCC)    Type  II   Diabetic neuropathy, type II diabetes mellitus ([place]) 12/13/2019   Dysrhythmia    GERD (gastroesophageal reflux disease)    HTN (hypertension)    Hypercholesteremia    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[place]) 12/13/2019   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[place]) 12/13/2019   Morbid obesity ([place]) 12/13/2019   Obstructive sleep apnea    PONV (postoperative nausea and vomiting)    "not the last few times"   RLS (restless legs syndrome)    S/P ablation of atrial flutter 02/12/2016   S/P lumbar fusion 08/04/2019   Sleep apnea    Past Surgical History:  Procedure Laterality Date   ATRIAL FIBRILLATION ABLATION     x3   COLONOSCOPY  01/04/2013   Moderate predominantly sigmoid diverticulosis. Small internal hemorrhoids. Otherwise normal colonosopy.     LUMBAR WOUND DEBRIDEMENT N/A 08/09/2019   Procedure: LUMBAR WOUND DEBRIDEMENT Evacuation of  EPIDURAL HEMATOMA;  Surgeon: Newman Pies, MD;  Location: [facility];  Service: Neurosurgery;  Laterality: N/A;    Family History  Problem Relation Age of Onset   Breast cancer Mother 61   Heart attack Father 66   Mental illness Sister    Heart Problems Brother 31   Cancer Brother    Breast cancer Paternal Grandmother    Colon cancer Paternal Aunt 54   Social History   Socioeconomic History   Marital status: Married    Spouse name: Not on file   Number of children: Not on file   Years of education: Not on file   Highest education level: Not on file  Occupational History   Not on file  Tobacco Use   Smoking status: Never    Passive exposure: Past   Smokeless tobacco: Never  Vaping Use   Vaping Use: Never used  Substance and Sexual Activity   Alcohol use: Not Currently   Drug use: Never   Sexual activity: Not Currently  Other Topics Concern   Not on file  Social History Narrative   Not on file   Social Determinants of Health   Financial Resource Strain: Not on file  Food Insecurity: Not on file  Transportation Needs: Not on file  Physical Activity: Not on file  Stress: Not on file  Social Connections: Not on file    Review of Systems  Constitutional:  Positive for fatigue. Negative for chills and fever.  HENT:  Negative for congestion, ear pain and sore throat.   Respiratory:  Positive for cough. Negative for shortness of breath.   Cardiovascular:  Negative for chest pain.    Objective:  BP 126/76    Pulse 88    Temp 98.6 F (37 C)    Resp 18    Ht 5\' 3"  (1.6 m)    Wt 198 lb (89.8 kg)    BMI 35.07 kg/m   BP/Weight 08/21/2021 07/31/2021 42/59/5638  Systolic BP 756 433 295  Diastolic BP 76 188 82  Wt. (Lbs) 198 201 199  BMI 35.07 35.61 35.25    Physical Exam Vitals reviewed.  Constitutional:      Appearance: Normal appearance. She is normal weight.  Neck:      Vascular: No carotid bruit.  Cardiovascular:     Rate and Rhythm: Normal rate and regular rhythm.     Heart sounds: Normal heart sounds.  Pulmonary:     Effort: Pulmonary effort is normal. No respiratory distress.     Breath sounds: Normal breath sounds.  Abdominal:  General: Abdomen is flat. Bowel sounds are normal.     Palpations: Abdomen is soft.     Tenderness: There is no abdominal tenderness.  Neurological:     Mental Status: She is alert and oriented to person, place, and time.  Psychiatric:        Mood and Affect: Mood normal.        Behavior: Behavior normal.    Diabetic Foot Exam - Simple   No data filed      Lab Results  Component Value Date   WBC 10.2 07/03/2021   HGB 13.0 07/03/2021   HCT 40.4 07/03/2021   PLT 310 07/03/2021   GLUCOSE 107 (H) 07/03/2021   CHOL 267 (H) 07/03/2021   TRIG 163 (H) 07/03/2021   HDL 49 07/03/2021   LDLCALC 188 (H) 07/03/2021   ALT 12 07/03/2021   AST 16 07/03/2021   NA 143 07/03/2021   K 4.6 07/03/2021   CL 102 07/03/2021   CREATININE 0.81 07/03/2021   BUN 13 07/03/2021   CO2 23 07/03/2021   TSH 1.380 07/03/2021   INR 0.9 08/04/2019   HGBA1C 6.8 (H) 07/02/2021      Assessment & Plan:   Problem List Items Addressed This Visit       Endocrine   Type 2 diabetes mellitus with diabetic neuropathy, with long-term current use of insulin ([place]) - Primary    Control: good Continue cgm Recommend check feet daily. Recommend annual eye exams. Medicines: no changes.  Continue to work on eating a healthy diet and exercise.  Labs drawn today.           Other   Moderate recurrent major depression (HCC)   Relevant Medications   buPROPion (WELLBUTRIN XL) 300 MG 24 hr tablet  .  Meds ordered this encounter  Medications   buPROPion (WELLBUTRIN XL) 300 MG 24 hr tablet    Sig: Take one tablet in morning for one week then increase to two tablets.    Dispense:  90 tablet    Refill:  1    Follow-up: Return in about 6  weeks (around 10/05/2021) for chronic fasting.  An After Visit Summary was printed and given to the patient.  Rochel Brome, MD [facility] [phone]

## 2021-08-22 NOTE — Telephone Encounter (Signed)
Pt has been added to waitlist   Can you OFFER HER 09/06/21 4pm.

## 2021-08-22 NOTE — Telephone Encounter (Signed)
Pt has now tested negative for Covid-19 and is on wait list.  Pt wants to know if there is anyway a RN can get her in earlier than the month of March.

## 2021-08-23 NOTE — Telephone Encounter (Signed)
Spoke with the pt to reschedule appt for 09/06/21 at 4 pm. Pt accepted. Nurse will add to Dr. Rhea Belton schedule

## 2021-08-23 NOTE — Telephone Encounter (Signed)
LVM asking pt to call back regarding scheduling appt.

## 2021-08-28 ENCOUNTER — Ambulatory Visit: Payer: Medicare Other | Admitting: Cardiology

## 2021-09-03 ENCOUNTER — Encounter: Payer: Self-pay | Admitting: Family Medicine

## 2021-09-03 NOTE — Assessment & Plan Note (Signed)
Control: good Continue cgm Recommend check feet daily. Recommend annual eye exams. Medicines: no changes.  Continue to work on eating a healthy diet and exercise.  Labs drawn today.

## 2021-09-04 ENCOUNTER — Ambulatory Visit: Payer: Medicare Other | Admitting: Neurology

## 2021-09-06 ENCOUNTER — Encounter: Payer: Self-pay | Admitting: Neurology

## 2021-09-06 ENCOUNTER — Ambulatory Visit: Payer: Medicare Other | Admitting: Neurology

## 2021-09-06 VITALS — BP 212/85 | HR 83 | Ht 63.0 in | Wt 198.0 lb

## 2021-09-06 DIAGNOSIS — E1142 Type 2 diabetes mellitus with diabetic polyneuropathy: Secondary | ICD-10-CM | POA: Diagnosis not present

## 2021-09-06 DIAGNOSIS — G2581 Restless legs syndrome: Secondary | ICD-10-CM | POA: Diagnosis not present

## 2021-09-06 DIAGNOSIS — M5416 Radiculopathy, lumbar region: Secondary | ICD-10-CM

## 2021-09-06 DIAGNOSIS — R269 Unspecified abnormalities of gait and mobility: Secondary | ICD-10-CM | POA: Diagnosis not present

## 2021-09-06 DIAGNOSIS — G959 Disease of spinal cord, unspecified: Secondary | ICD-10-CM | POA: Insufficient documentation

## 2021-09-06 DIAGNOSIS — G4733 Obstructive sleep apnea (adult) (pediatric): Secondary | ICD-10-CM

## 2021-09-06 MED ORDER — PREGABALIN 100 MG PO CAPS
ORAL_CAPSULE | ORAL | 4 refills | Status: DC
Start: 2021-09-06 — End: 2022-04-15

## 2021-09-06 MED ORDER — PREGABALIN 100 MG PO CAPS: 100.0000 mg | ORAL_CAPSULE | Freq: Four times a day (QID) | ORAL | 4 refills | Status: DC

## 2021-09-06 NOTE — Addendum Note (Signed)
Addended by: Marcial Pacas on: 09/06/2021 05:22 PM   Modules accepted: Orders

## 2021-09-06 NOTE — Patient Instructions (Signed)
[place] shoe horn.  Orders Placed This Encounter  Procedures   Ambulatory referral to Physical Therapy   Ambulatory referral to Sleep Studies

## 2021-09-06 NOTE — Progress Notes (Signed)
Chief Complaint  Patient presents with   Follow-up    Room 13. Reports worsening pain. She would like to review MRI results.      ASSESSMENT AND PLAN  Caitlin [name] is a 76 y.o. female   Gait abnormality  Multifactorial, likely combination of diabetic peripheral neuropathy, lumbar radiculopathy, cervical spondylitic myelopathy, joints pain, obesity deconditioning  Referred for physical therapy  Reviewed MRI cervical spine December 2022 was patient, multilevel degenerative changes, moderate spinal stenosis C3-4, and C5-6, C6-7, variable degree of foraminal narrowing, multilevel stenosis,  Patient overall function well, no cord signal abnormality, comorbidity, aging, not a good surgical candidate, will hold off neurosurgeon refer  Restless leg symptoms  Long history of diabetic peripheral neuropathy, restless leg symptoms, was on titrating dose of Requip, up to 3 mg 6 times a day, developed augmentation, has stopped taking it since October 2022,   Her symptoms much improved with the Lyrica, 100 mg 3 times a day, still have difficulty sleeping, will add an extra dose of Lyrica make it 200 every night,  Ferritin was low at 18, she is on over-the-counter iron supplement, tried to make it over 50  Obstructive sleep apnea,  Frequent awakening, excessive daytime sleepiness, fatigue, narrow pharyngeal space,  Refer to sleep study  DIAGNOSTIC DATA (LABS, IMAGING, TESTING) - I reviewed patient records, labs, notes, testing and imaging myself where available.  MRI lumbar in Dec 2020 1. 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 2. Subdural fluid collection from L1-2 to L3-4, a not infrequent finding in the immediate postoperative although subdural fluid may contribute to the spinal stenosis at L3-4. 3. Left foraminal disc extrusion at L5-S1 with moderate foraminal stenosis, mildly progressed. 4. Unchanged left  subarticular disc extrusion at L2-3 with mild left lateral recess stenosis.  Personally reviewed MRI of the cervical spine without contrast in December 2022 1.   At C3-C4, there is moderate spinal stenosis due to degenerative changes and also cause moderately severe left and moderate right foraminal narrowing.  There is potential for left C4 nerve root compression. 2.   At C4-C5, there is mild spinal stenosis and moderate foraminal narrowing but no nerve root compression. 3.   At C5-C6, there is moderate spinal stenosis and moderately severe bilateral foraminal narrowing due to disc herniation and other degenerative changes.  There is potential for C6 nerve root compression to either side. 4.   At C6-C7, there is moderate spinal stenosis and mild to moderate bilateral foraminal narrowing but no nerve root compression. 5.   At C7-T1, there is mild spinal stenosis due to anterolisthesis and other degenerative change.  There is no nerve root compression. 6.   The spinal cord appears normal.  MEDICAL HISTORY:  Caitlin [name] is a 76 year old female, seen in request by PA Leafy Kindle, for evaluation of bilateral lower extremity paresthesia, gait abnormality, her primary care physician is Dr. Rochel Brome, initial evaluation was on July 02, 2021.  I reviewed and summarized the referring note. PMHX DM  A fib, s/p ablation, is on Xarelto. HTN Lumbar decompression in 2020  Patient had long history of diabetes, become insulin-dependent since 2018, also developed peripheral neuropathy, palm of her feet warm, burning sensation, radiating towards leg,  She has been diagnosed with restless leg syndrome was treated with titrating dose of Requip over the past 10 years, she complains of worsening restless leg syndrome since her lumbar decompression surgery in 2020, severe  lumbar spondylosis with stenosis at L3-4 L4-5 level,  She had decompressive lumbar laminectomy L3-4, L4-5 and posterior lumbar  interbody fusion on August 04, 2019.  But developed epidural hematoma, required evacuation, she does have a history of atrial fibrillation, supposed to take Xarelto, has failed multiple A. fib ablation procedure in the past, she missed multiple cardiology follow-up, stop the Xarelto treatment by herself, emphasized importance of compliant with anticoagulation,  She now complains of bilateral lower extremity severe discomfort, especially at evening time at nighttime, after prolonged sitting, trying to go to sleep, she felt there bilateral lower extremity especially left lateral calf area muscle spasm sensation, has the urge to move, pacing around, rubbing her leg often helps her some.  She was given Requip for restless leg symptoms, gradually on titrating dose up to 3 mg 6 tablets a day before she started in October 2020, even at that dose of Requip is no longer helpful, she was given gabapentin 600 mg daily following that, she denies significant improvement,  She complains of intermittent gait abnormality, neck pain, radiating pain to bilateral shoulder, worsening urinary urgency, frequency, frequent nocturia,  UPDATE Sep 06 2021: Patient will continue complains of gait abnormality, but overall function well at home, lives with her husband at her house, mild gait abnormality, still driving, intermittent low back pain, neck pain radiating pain to bilateral shoulder, slow worsening urinary frequency, occasionally incontinence couple times each week since 2022,  she has been off Requip, Lyrica 100 mg 3 times daily was helpful, still has difficulty sleeping sometimes  Reviewed laboratory evaluation with her, ferritin was 18, normal B12, A1c was 6.8, normal TSH, LDL was 188, cholesterol 267, hemoglobin of 13, normal CMP, creatinine of 0.8  Personally reviewed MRI cervical spine in December 2022, multilevel degenerative changes, moderate stenosis multiple levels, C3-4, C5-6, C6-7, variable degree of  foraminal narrowing, no cord signal abnormality  She also complains of loud snoring, frequent awakening at nighttime, excessive daytime sleepiness, fatigue  PHYSICAL EXAM:   Vitals:   09/06/21 1605  BP: (!) 212/85  Pulse: 83  Weight: 198 lb (89.8 kg)  Height: 5\' 3"  (1.6 m)   Not recorded     Body mass index is 35.07 kg/m.  PHYSICAL EXAMNIATION:  Gen: NAD, conversant, well nourised, well groomed                     Cardiovascular: Irregular rate and rhythm Eyes: Conjunctivae clear without exudates or hemorrhage Neck: Supple, no carotid bruits. Pulmonary: Clear to auscultation bilaterally   NEUROLOGICAL EXAM:  MENTAL STATUS: Speech/cognition: Awake, alert, oriented to history taking and casual conversation   CRANIAL NERVES: CN II: Visual fields are full to confrontation. Pupils are round equal and briskly reactive to light. CN III, IV, VI: extraocular movement are normal. No ptosis. CN V: Facial sensation is intact to light touch CN VII: Face is symmetric with normal eye closure  CN VIII: Hearing is normal to causal conversation. CN IX, X: Phonation is normal. CN XI: Head turning and shoulder shrug are intact CN XII: Obesity, narrow oropharyngeal space  MOTOR: There is no pronator drift of out-stretched arms. Muscle bulk and tone are normal. Muscle strength is normal.  REFLEXES: Reflexes are 3 and symmetric at the biceps, triceps, knees, and trace at ankles. Plantar responses are extensor bilaterally  SENSORY: Length dependent decreased vibratory sensation, light touch, pinprick to ankle level  COORDINATION: There is no trunk or limb dysmetria noted.  GAIT/STANCE: She needs push-up  to get up from seated position, cautious, slightly unsteady, wide-based, cautious  REVIEW OF SYSTEMS:  Full 14 system review of systems performed and notable only for as above All other review of systems were negative.   ALLERGIES: Allergies  Allergen Reactions   Codeine  Nausea Only    HOME MEDICATIONS: Current Outpatient Medications  Medication Sig Dispense Refill   albuterol (PROVENTIL) (2.5 MG/3ML) 0.083% nebulizer solution Take 3 mLs (2.5 mg total) by nebulization every 6 (six) hours as needed for wheezing or shortness of breath. 75 mL 0   albuterol (VENTOLIN HFA) 108 (90 Base) MCG/ACT inhaler Inhale 2 puffs into the lungs every 6 (six) hours as needed for wheezing or shortness of breath. 8 g 3   Budeson-Glycopyrrol-Formoterol (BREZTRI AEROSPHERE) 160-9-4.8 MCG/ACT AERO Inhale 2 puffs into the lungs in the morning and at bedtime. 10.7 g 3   buPROPion (WELLBUTRIN XL) 300 MG 24 hr tablet Take one tablet in morning for one week then increase to two tablets. 90 tablet 1   calcium carbonate (TUMS - DOSED IN MG ELEMENTAL CALCIUM) 500 MG chewable tablet Chew 2 tablets by mouth daily as needed for indigestion.      Calcium-Vitamin D-Vitamin K 500-100-40 MG-UNT-MCG CHEW Chew 1 tablet by mouth at bedtime.     Continuous Blood Gluc Receiver (FREESTYLE LIBRE 14 DAY READER) DEVI USE TO CHECK BLOOD SUGARS DAILY     Continuous Blood Gluc Sensor (FREESTYLE LIBRE 14 DAY SENSOR) MISC SMARTSIG:1 Topical Every 2 Weeks     Ferrous Sulfate (SLOW RELEASE IRON PO) Take by mouth daily with supper.     fexofenadine (ALLEGRA) 180 MG tablet Take 180 mg by mouth daily as needed for allergies or rhinitis.     Insulin Degludec (TRESIBA FLEXTOUCH) 200 UNIT/ML SOPN Inject 50 Units into the skin every evening.     Insulin Pen Needle 31G X 8 MM MISC Inject 1 Units into the skin daily.     Insulin Pen Needle 32G X 4 MM MISC 1 each by Does not apply route in the morning and at bedtime. 100 each 2   lisinopril (ZESTRIL) 40 MG tablet Take 1 tablet (40 mg total) by mouth daily. 90 tablet 1   omeprazole (PRILOSEC) 40 MG capsule Take 40 mg by mouth daily.     oxybutynin (DITROPAN-XL) 10 MG 24 hr tablet TAKE 1 TABLET BY MOUTH EVERYDAY AT BEDTIME 90 tablet 1   pregabalin (LYRICA) 100 MG capsule Take  1 capsule (100 mg total) by mouth 3 (three) times daily. 90 capsule 5   rivaroxaban (XARELTO) 20 MG TABS tablet Take by mouth at bedtime.     rosuvastatin (CRESTOR) 20 MG tablet Take 1 tablet (20 mg total) by mouth daily. 90 tablet 0   Semaglutide,0.25 or 0.5MG /DOS, 2 MG/1.5ML SOPN Inject 0.5 mg into the skin every Monday.      VITAMIN D-VITAMIN K PO Take 1 tablet by mouth every evening.     No current facility-administered medications for this visit.    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 Small internal hemorrhoids. Otherwise normal colonosopy.    LUMBAR WOUND DEBRIDEMENT N/A 08/09/2019   Procedure: LUMBAR WOUND DEBRIDEMENT Evacuation of  EPIDURAL HEMATOMA;  Surgeon: Newman Pies, MD;  Location: [facility];  Service: Neurosurgery;  Laterality: N/A;    FAMILY HISTORY: Family History  Problem Relation Age of Onset   Breast cancer Mother 9   Heart attack  Father 63   Mental illness Sister    Heart Problems Brother 12   Cancer Brother    Breast cancer Paternal Grandmother    Colon cancer Paternal Aunt 46    SOCIAL HISTORY: Social History   Socioeconomic History   Marital status: Married    Spouse name: Not on file   Number of children: Not on file   Years of education: Not on file   Highest education level: Not on file  Occupational History   Not on file  Tobacco Use   Smoking status: Never    Passive exposure: Past   Smokeless tobacco: Never  Vaping Use   Vaping Use: Never used  Substance and Sexual Activity   Alcohol use: Not Currently   Drug use: Never   Sexual activity: Not Currently  Other Topics Concern   Not on file  Social History Narrative   Not on file   Social Determinants of Health   Financial Resource Strain: Not on file  Food Insecurity: Not on file  Transportation Needs: Not on file  Physical Activity: Not on file  Stress: Not on file  Social Connections: Not on file  Intimate Partner Violence: Not on file      Marcial Pacas, M.D. Ph.D.  [facility] [address] [place], [place] [address] Ph: [phone] Fax: [phone]  CC:  Rochel Brome, MD [place],  [place] [address]  Rochel Brome, MD

## 2021-09-07 ENCOUNTER — Other Ambulatory Visit: Payer: Self-pay | Admitting: Family Medicine

## 2021-09-11 ENCOUNTER — Other Ambulatory Visit: Payer: Self-pay | Admitting: Family Medicine

## 2021-09-11 DIAGNOSIS — G2581 Restless legs syndrome: Secondary | ICD-10-CM

## 2021-09-13 ENCOUNTER — Ambulatory Visit: Payer: Medicare Other | Admitting: Neurology

## 2021-09-26 ENCOUNTER — Telehealth: Payer: Self-pay | Admitting: Neurology

## 2021-09-26 NOTE — Telephone Encounter (Signed)
Faxed records to Luretha Murphy, [place] fax# [phone]

## 2021-10-01 ENCOUNTER — Telehealth: Payer: Self-pay

## 2021-10-01 NOTE — Telephone Encounter (Signed)
Unable to contact patient, will schedule patient for sleep consult if patient calls back

## 2021-10-04 NOTE — Progress Notes (Signed)
Subjective:  Patient ID: Caitlin [name], female    DOB: 1946/05/15  Age: 76 y.o. MRN: [id number]  Chief Complaint  Patient presents with   Diabetes   Hyperlipidemia   Hypertension    Diabetes:  Complications: neuropathy Glucose checking: Continuous glucose monitor  Glucose logs: Range 140-160  Hypoglycemia: 52  Most recent A1C: Current medications: ozempic 0.5 mg once weekly and on tresiba 50 U before bed. ON lyrica 100 mg one in am and 2 at night. Last Eye Exam: 12/08/2020 Foot checks: daily Sees Dr. Meredith Pel for management.   Hyperlipidemia/aortic atherosclerosis: Current medications: crestor 20 mg daily   Hypertension: Current medications: lisinopril 40 mg daily.   Depression: buproprion xl 300 mg once daily in am PHQ9 SCORE ONLY 10/05/2021 08/21/2021 06/26/2021  PHQ-9 Total Score 9 13 12     Urge incontinence: on oxybutyin xl. Working well. Saw Dr. Venia Minks.   Diet: trying. Changed off sodas.  Exercise:    RLS: Taking lyrica 100 mg in am and 200 mg at night.   Off gabapentin and requip. Patient was seen by neurology, who recommended PHYSICAL THERAPY, but she is waiting to complete her other cataract surgery.  OSA: referred for sleep study by neurology. Patient refused because she will not wear the cpap.   Gait abnormality: recommended PHYSICAL THERAPY but patient would like to wait until after second cataract removed.  Atrial fibrillation: on xarelto 20 mg daily.   Had cataract surgery ion September 24, 2021 and other eye is scheduled in March 2023. Patient is currently on several eye drops.   Asthma: recent diagnosis. ON breztri 2 puffs twice daily and albuterol hFA. Never smoked.   Current Outpatient Medications on File Prior to Visit  Medication Sig Dispense Refill   ketorolac (ACULAR) 0.5 % ophthalmic solution INSTILL 1 DROP INTO LEFT EYE FOUR TIMES A DAY AS DIRECTED     oxybutynin (DITROPAN-XL) 10 MG 24 hr tablet Take by mouth.     albuterol (PROVENTIL) (2.5  MG/3ML) 0.083% nebulizer solution Take 3 mLs (2.5 mg total) by nebulization every 6 (six) hours as needed for wheezing or shortness of breath. 75 mL 0   albuterol (VENTOLIN HFA) 108 (90 Base) MCG/ACT inhaler Inhale 2 puffs into the lungs every 6 (six) hours as needed for wheezing or shortness of breath. 8 g 3   BD PEN NEEDLE NANO 2ND GEN 32G X 4 MM MISC 1 EACH BY DOES NOT APPLY ROUTE IN THE MORNING AND AT BEDTIME. 100 each 2   Budeson-Glycopyrrol-Formoterol (BREZTRI AEROSPHERE) 160-9-4.8 MCG/ACT AERO Inhale 2 puffs into the lungs in the morning and at bedtime. 10.7 g 3   buPROPion (WELLBUTRIN XL) 300 MG 24 hr tablet Take one tablet in morning for one week then increase to two tablets. 90 tablet 1   calcium carbonate (TUMS - DOSED IN MG ELEMENTAL CALCIUM) 500 MG chewable tablet Chew 2 tablets by mouth daily as needed for indigestion.      Continuous Blood Gluc Receiver (FREESTYLE LIBRE 14 DAY READER) DEVI USE TO CHECK BLOOD SUGARS DAILY     Continuous Blood Gluc Sensor (FREESTYLE LIBRE 14 DAY SENSOR) MISC SMARTSIG:1 Topical Every 2 Weeks     Ferrous Sulfate (SLOW RELEASE IRON PO) Take by mouth daily with supper.     fexofenadine (ALLEGRA) 180 MG tablet Take 180 mg by mouth daily as needed for allergies or rhinitis.     Insulin Degludec (TRESIBA FLEXTOUCH) 200 UNIT/ML SOPN Inject 50 Units into the skin every evening.  Insulin Pen Needle 31G X 8 MM MISC Inject 1 Units into the skin daily.     moxifloxacin (VIGAMOX) 0.5 % ophthalmic solution Place 1 drop into the left eye 4 (four) times daily.     omeprazole (PRILOSEC) 40 MG capsule Take 40 mg by mouth daily.     prednisoLONE acetate (PRED FORTE) 1 % ophthalmic suspension Place 1 drop into the left eye 4 (four) times daily.     pregabalin (LYRICA) 100 MG capsule 1 am, one at noon, 2 qhs 360 capsule 4   rivaroxaban (XARELTO) 20 MG TABS tablet Take by mouth at bedtime.     rosuvastatin (CRESTOR) 20 MG tablet Take 1 tablet (20 mg total) by mouth daily.  90 tablet 0   Semaglutide,0.25 or 0.5MG /DOS, 2 MG/1.5ML SOPN Inject 0.5 mg into the skin every Monday.      VITAMIN D-VITAMIN K PO Take 1 tablet by mouth every evening.     No current facility-administered medications on file prior to visit.   Past Medical History:  Diagnosis Date   Arthritis    Asthma    Atrial fibrillation (HCC)    Cauda equina syndrome ([place]) 5/40/0867   Complication of anesthesia    COPD (chronic obstructive pulmonary disease) (HCC)    Depression    Diabetes mellitus without complication (HCC)    Type II   Diabetic neuropathy, type II diabetes mellitus ([place]) 12/13/2019   Dysrhythmia    GERD (gastroesophageal reflux disease)    HTN (hypertension)    Hypercholesteremia    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[place]) 12/13/2019   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[place]) 12/13/2019   Morbid obesity ([place]) 12/13/2019   Obstructive sleep apnea    PONV (postoperative nausea and vomiting)    "not the last few times"   RLS (restless legs syndrome)    S/P ablation of atrial flutter 02/12/2016   S/P lumbar fusion 08/04/2019   Sleep apnea    Upper respiratory tract infection due to COVID-19 virus 08/14/2021   Past Surgical History:  Procedure Laterality Date   ATRIAL FIBRILLATION ABLATION     x3   COLONOSCOPY  01/04/2013   Moderate predominantly sigmoid diverticulosis. Small internal hemorrhoids. Otherwise normal colonosopy.    LUMBAR WOUND DEBRIDEMENT N/A 08/09/2019   Procedure: LUMBAR WOUND DEBRIDEMENT Evacuation of  EPIDURAL HEMATOMA;  Surgeon: Newman Pies, MD;  Location: [facility];  Service: Neurosurgery;  Laterality: N/A;    Family History  Problem Relation Age of Onset   Breast cancer Mother 50   Heart attack Father 106   Mental illness Sister    Heart Problems Brother 41   Cancer Brother    Breast cancer Paternal Grandmother    Colon cancer Paternal Aunt 61   Social History   Socioeconomic History   Marital  status: Married    Spouse name: Not on file   Number of children: Not on file   Years of education: Not on file   Highest education level: Not on file  Occupational History   Not on file  Tobacco Use   Smoking status: Never    Passive exposure: Past   Smokeless tobacco: Never  Vaping Use   Vaping Use: Never used  Substance and Sexual Activity   Alcohol use: Not Currently   Drug use: Never   Sexual activity: Not Currently  Other Topics Concern   Not on file  Social History Narrative   Not on file   Social Determinants of  Health   Financial Resource Strain: Not on file  Food Insecurity: Not on file  Transportation Needs: Not on file  Physical Activity: Not on file  Stress: Not on file  Social Connections: Not on file    Review of Systems  Constitutional:  Negative for appetite change, fatigue and fever.  HENT:  Negative for congestion, ear pain, sinus pressure and sore throat.   Eyes:  Negative for pain.  Respiratory:  Positive for cough (dry) and shortness of breath. Negative for chest tightness and wheezing.   Cardiovascular:  Negative for chest pain and palpitations.  Gastrointestinal:  Negative for abdominal pain, constipation, diarrhea, nausea and vomiting.  Genitourinary:  Negative for dysuria and hematuria.  Musculoskeletal:  Positive for back pain. Negative for arthralgias, joint swelling and myalgias.  Skin:  Negative for rash.  Neurological:  Positive for light-headedness and headaches. Negative for dizziness and weakness.  Psychiatric/Behavioral:  Positive for dysphoric mood. The patient is not nervous/anxious.     Objective:  BP (!) 160/100    Pulse 88    Temp (!) 97.2 F (36.2 C)    Resp 16    Ht 5\' 1"  (1.549 m)    Wt 201 lb (91.2 kg)    BMI 37.98 kg/m   BP/Weight 10/05/2021 01/18/931 10/22/5730  Systolic BP 202 542 706  Diastolic BP 237 85 76  Wt. (Lbs) 201 198 198  BMI 37.98 35.07 35.07    Physical Exam Vitals reviewed.  Constitutional:       Appearance: Normal appearance. She is obese.  Neck:     Vascular: No carotid bruit.  Cardiovascular:     Rate and Rhythm: Normal rate and regular rhythm.     Heart sounds: Normal heart sounds.  Pulmonary:     Effort: Pulmonary effort is normal. No respiratory distress.     Breath sounds: Normal breath sounds.  Abdominal:     General: Abdomen is flat. Bowel sounds are normal.     Palpations: Abdomen is soft.     Tenderness: There is no abdominal tenderness.  Neurological:     Mental Status: She is alert and oriented to person, place, and time.  Psychiatric:        Mood and Affect: Mood normal.        Behavior: Behavior normal.    Diabetic Foot Exam - Simple   Simple Foot Form Diabetic Foot exam was performed with the following findings: Yes 10/05/2021  9:19 AM  Visual Inspection See comments: Yes Sensation Testing Intact to touch and monofilament testing bilaterally: Yes Pulse Check Posterior Tibialis and Dorsalis pulse intact bilaterally: Yes Comments Large bunion on right foot.  Left foot has scrape on fourth toe.  Great toenails thickened.       Lab Results  Component Value Date   WBC 10.8 10/05/2021   HGB 13.5 10/05/2021   HCT 41.9 10/05/2021   PLT 288 10/05/2021   GLUCOSE 91 10/05/2021   CHOL 258 (H) 10/05/2021   TRIG 125 10/05/2021   HDL 53 10/05/2021   LDLCALC 183 (H) 10/05/2021   ALT 14 10/05/2021   AST 16 10/05/2021   NA 142 10/05/2021   K 5.0 10/05/2021   CL 102 10/05/2021   CREATININE 0.72 10/05/2021   BUN 15 10/05/2021   CO2 27 10/05/2021   TSH 1.380 07/03/2021   INR 0.9 08/04/2019   HGBA1C 7.3 (H) 10/05/2021      Assessment & Plan:   Problem List Items Addressed This Visit  Cardiovascular and Mediastinum   Hypertension associated with diabetes ([place]) (Chronic)    Poorly controlled.  Change lisinopril 40 mg daily to valsartan 160 mg daily.  Lisinopril may also be contributing to cough      Relevant Medications   valsartan  (DIOVAN) 160 MG tablet   Other Relevant Orders   CBC With Diff/Platelet (Completed)   Comprehensive metabolic panel (Completed)   Atrial fibrillation (HCC)    Rate controlled. Continue xarelto 20 mg daily.       Relevant Medications   valsartan (DIOVAN) 160 MG tablet   Aortic atherosclerosis (HCC)    Continue crestor.  Recommend continue to work on eating healthy diet and exercise.       Relevant Medications   valsartan (DIOVAN) 160 MG tablet     Respiratory   Obstructive sleep apnea    Intolerant to cpap. Neurology recommended repeat sleep study, but patient says she is unable to wear cpap, so does not see the point.       Asthma    Continue breztri 2 puffs twice daily and albuterol as needed.         Digestive   Gastroesophageal reflux disease without esophagitis    The current medical regimen is effective;  continue present plan and medications.         Endocrine   Type 2 diabetes mellitus with diabetic neuropathy, with long-term current use of insulin (HCC) - Primary    Control: good Recommend check sugars fasting daily. Recommend check feet daily. Recommend annual eye exams. Medicines: consider increase ozempic to 1 mg qweek, but may need to decrease insulin. Defer management to Dr. Meredith Pel.  If patient wishes to return here for management since her diabetes is well controlled, she may. Continue to work on eating a healthy diet and exercise.  Labs drawn today.         Relevant Medications   valsartan (DIOVAN) 160 MG tablet   Other Relevant Orders   Hemoglobin A1c (Completed)   Microalbumin / creatinine urine ratio (Completed)   RESOLVED: Acquired hypothyroidism     Hematopoietic and Hemostatic   Acquired thrombophilia ([place])    Continue xarelto due to atrial fibrillation        Other   Class 2 severe obesity due to excess calories with serious comorbidity and body mass index (BMI) of 37.0 to 37.9 in adult [facility])    Recommend continue to work on eating  healthy diet and exercise. Consider increase to ozempic. Comorbidities include diabetes and hyperlipidemia      Restless leg syndrome    Continue lyrica.       Mixed hyperlipidemia    Await labs/testing for assessment and recommendations. Started crestor 20 mg 3 months ago Continue to work on eating a healthy diet and exercise.  Labs drawn today.        Relevant Medications   valsartan (DIOVAN) 160 MG tablet   Other Relevant Orders   Lipid panel (Completed)   Mild recurrent major depression ([place])    The current medical regimen is effective;  continue present plan and medications.      .  Meds ordered this encounter  Medications   valsartan (DIOVAN) 160 MG tablet    Sig: Take 1 tablet (160 mg total) by mouth daily.    Dispense:  90 tablet    Refill:  3    Orders Placed This Encounter  Procedures   CBC With Diff/Platelet   Comprehensive metabolic panel   Lipid panel  Hemoglobin A1c   Microalbumin / creatinine urine ratio   Cardiovascular Risk Assessment    Total time spent on today's visit was greater than 30 minutes, including both face-to-face time and nonface-to-face time personally spent on review of chart (labs and imaging), discussing labs and goals, discussing further work-up, treatment options, referrals to specialist if needed, reviewing outside records of pertinent, answering patient's questions, and coordinating care.  Follow-up: Return in about 4 weeks (around 11/02/2021) for hypertension follow up with Larene Beach, NP.  An After Visit Summary was printed and given to the patient.  Rochel Brome, MD [facility] [phone]

## 2021-10-05 ENCOUNTER — Ambulatory Visit: Payer: Medicare Other | Admitting: Family Medicine

## 2021-10-05 ENCOUNTER — Other Ambulatory Visit: Payer: Self-pay

## 2021-10-05 ENCOUNTER — Encounter: Payer: Self-pay | Admitting: Family Medicine

## 2021-10-05 VITALS — BP 160/100 | HR 88 | Temp 97.2°F | Resp 16 | Ht 61.0 in | Wt 201.0 lb

## 2021-10-05 DIAGNOSIS — F331 Major depressive disorder, recurrent, moderate: Secondary | ICD-10-CM

## 2021-10-05 DIAGNOSIS — E782 Mixed hyperlipidemia: Secondary | ICD-10-CM

## 2021-10-05 DIAGNOSIS — F330 Major depressive disorder, recurrent, mild: Secondary | ICD-10-CM

## 2021-10-05 DIAGNOSIS — D6869 Other thrombophilia: Secondary | ICD-10-CM

## 2021-10-05 DIAGNOSIS — G4733 Obstructive sleep apnea (adult) (pediatric): Secondary | ICD-10-CM

## 2021-10-05 DIAGNOSIS — J4540 Moderate persistent asthma, uncomplicated: Secondary | ICD-10-CM

## 2021-10-05 DIAGNOSIS — E1140 Type 2 diabetes mellitus with diabetic neuropathy, unspecified: Secondary | ICD-10-CM | POA: Diagnosis not present

## 2021-10-05 DIAGNOSIS — G2581 Restless legs syndrome: Secondary | ICD-10-CM

## 2021-10-05 DIAGNOSIS — K219 Gastro-esophageal reflux disease without esophagitis: Secondary | ICD-10-CM

## 2021-10-05 DIAGNOSIS — I152 Hypertension secondary to endocrine disorders: Secondary | ICD-10-CM

## 2021-10-05 DIAGNOSIS — I700 Atherosclerosis of aorta: Secondary | ICD-10-CM

## 2021-10-05 DIAGNOSIS — Z794 Long term (current) use of insulin: Secondary | ICD-10-CM

## 2021-10-05 DIAGNOSIS — E1159 Type 2 diabetes mellitus with other circulatory complications: Secondary | ICD-10-CM

## 2021-10-05 DIAGNOSIS — E039 Hypothyroidism, unspecified: Secondary | ICD-10-CM

## 2021-10-05 DIAGNOSIS — Z6837 Body mass index (BMI) 37.0-37.9, adult: Secondary | ICD-10-CM

## 2021-10-05 DIAGNOSIS — I480 Paroxysmal atrial fibrillation: Secondary | ICD-10-CM

## 2021-10-05 MED ORDER — VALSARTAN 160 MG PO TABS: 160.0000 mg | ORAL_TABLET | Freq: Every day | ORAL | 3 refills | Status: DC

## 2021-10-05 NOTE — Patient Instructions (Signed)
Hypertension: uncontrolled. Recommend change lisinopril to valsartan 160 mg daily   Recommend patient get tetanus (TDAP) and Shingles (Shingrix series) at pharmacy.   Recommend patient get Covid bivalent vaccine.   Recommend continue to work on eating healthy diet and exercise.  Go to Physical therapy when able.

## 2021-10-06 ENCOUNTER — Encounter: Payer: Self-pay | Admitting: Family Medicine

## 2021-10-06 DIAGNOSIS — D6869 Other thrombophilia: Secondary | ICD-10-CM | POA: Insufficient documentation

## 2021-10-06 LAB — LIPID PANEL
Chol/HDL Ratio: 4.9 ratio — ABNORMAL HIGH (ref 0.0–4.4)
Cholesterol, Total: 258 mg/dL — ABNORMAL HIGH (ref 100–199)
HDL: 53 mg/dL (ref >39)
LDL Chol Calc (NIH): 183 mg/dL — ABNORMAL HIGH (ref 0–99)
Triglycerides: 125 mg/dL (ref 0–149)
VLDL Cholesterol Cal: 22 mg/dL (ref 5–40)

## 2021-10-06 LAB — CBC WITH DIFF/PLATELET
Basophils Absolute: 0.1 10*3/uL (ref 0.0–0.2)
Basos: 1 %
EOS (ABSOLUTE): 0.3 10*3/uL (ref 0.0–0.4)
Eos: 3 %
Hematocrit: 41.9 % (ref 34.0–46.6)
Hemoglobin: 13.5 g/dL (ref 11.1–15.9)
Immature Grans (Abs): 0 10*3/uL (ref 0.0–0.1)
Immature Granulocytes: 0 %
Lymphocytes Absolute: 2.7 10*3/uL (ref 0.7–3.1)
Lymphs: 25 %
MCH: 25.9 pg — ABNORMAL LOW (ref 26.6–33.0)
MCHC: 32.2 g/dL (ref 31.5–35.7)
MCV: 80 fL (ref 79–97)
Monocytes Absolute: 0.8 10*3/uL (ref 0.1–0.9)
Monocytes: 7 %
Neutrophils Absolute: 6.9 10*3/uL (ref 1.4–7.0)
Neutrophils: 64 %
Platelets: 288 10*3/uL (ref 150–450)
RBC: 5.22 x10E6/uL (ref 3.77–5.28)
RDW: 16.5 % — ABNORMAL HIGH (ref 11.7–15.4)
WBC: 10.8 10*3/uL (ref 3.4–10.8)

## 2021-10-06 LAB — MICROALBUMIN / CREATININE URINE RATIO
Creatinine, Urine: 109.9 mg/dL
Microalb/Creat Ratio: 27 mg/g creat (ref 0–29)
Microalbumin, Urine: 29.3 ug/mL

## 2021-10-06 LAB — COMPREHENSIVE METABOLIC PANEL
ALT: 14 IU/L (ref 0–32)
AST: 16 IU/L (ref 0–40)
Albumin/Globulin Ratio: 1.9 (ref 1.2–2.2)
Albumin: 4.8 g/dL — ABNORMAL HIGH (ref 3.7–4.7)
Alkaline Phosphatase: 97 IU/L (ref 44–121)
BUN/Creatinine Ratio: 21 (ref 12–28)
BUN: 15 mg/dL (ref 8–27)
Bilirubin Total: 0.3 mg/dL (ref 0.0–1.2)
CO2: 27 mmol/L (ref 20–29)
Calcium: 10.2 mg/dL (ref 8.7–10.3)
Chloride: 102 mmol/L (ref 96–106)
Creatinine, Ser: 0.72 mg/dL (ref 0.57–1.00)
Globulin, Total: 2.5 g/dL (ref 1.5–4.5)
Glucose: 91 mg/dL (ref 70–99)
Potassium: 5 mmol/L (ref 3.5–5.2)
Sodium: 142 mmol/L (ref 134–144)
Total Protein: 7.3 g/dL (ref 6.0–8.5)
eGFR: 88 mL/min/{1.73_m2} (ref >59)

## 2021-10-06 LAB — HEMOGLOBIN A1C
Est. average glucose Bld gHb Est-mCnc: 163 mg/dL
Hgb A1c MFr Bld: 7.3 % — ABNORMAL HIGH (ref 4.8–5.6)

## 2021-10-06 NOTE — Assessment & Plan Note (Signed)
The current medical regimen is effective;  continue present plan and medications.  

## 2021-10-06 NOTE — Assessment & Plan Note (Signed)
Continue xarelto due to atrial fibrillation

## 2021-10-06 NOTE — Assessment & Plan Note (Signed)
Rate controlled. Continue xarelto 20 mg daily.

## 2021-10-06 NOTE — Assessment & Plan Note (Signed)
Continue breztri 2 puffs twice daily and albuterol as needed.

## 2021-10-06 NOTE — Assessment & Plan Note (Signed)
Continue crestor.  Recommend continue to work on eating healthy diet and exercise.

## 2021-10-06 NOTE — Assessment & Plan Note (Addendum)
Recommend continue to work on eating healthy diet and exercise. Consider increase to ozempic. Comorbidities include diabetes and hyperlipidemia

## 2021-10-06 NOTE — Assessment & Plan Note (Signed)
Continue lyrica 

## 2021-10-06 NOTE — Assessment & Plan Note (Signed)
Poorly controlled.  Change lisinopril 40 mg daily to valsartan 160 mg daily.  Lisinopril may also be contributing to cough

## 2021-10-06 NOTE — Assessment & Plan Note (Signed)
Intolerant to cpap. Neurology recommended repeat sleep study, but patient says she is unable to wear cpap, so does not see the point.

## 2021-10-06 NOTE — Assessment & Plan Note (Signed)
Control: good Recommend check sugars fasting daily. Recommend check feet daily. Recommend annual eye exams. Medicines: consider increase ozempic to 1 mg qweek, but may need to decrease insulin. Defer management to Dr. Meredith Pel.  If patient wishes to return here for management since her diabetes is well controlled, she may. Continue to work on eating a healthy diet and exercise.  Labs drawn today.

## 2021-10-06 NOTE — Assessment & Plan Note (Signed)
Await labs/testing for assessment and recommendations. Started crestor 20 mg 3 months ago Continue to work on eating a healthy diet and exercise.  Labs drawn today.

## 2021-10-08 ENCOUNTER — Other Ambulatory Visit: Payer: Self-pay

## 2021-10-08 DIAGNOSIS — I480 Paroxysmal atrial fibrillation: Secondary | ICD-10-CM

## 2021-10-08 DIAGNOSIS — Z794 Long term (current) use of insulin: Secondary | ICD-10-CM

## 2021-10-08 DIAGNOSIS — E1140 Type 2 diabetes mellitus with diabetic neuropathy, unspecified: Secondary | ICD-10-CM

## 2021-10-08 DIAGNOSIS — E782 Mixed hyperlipidemia: Secondary | ICD-10-CM

## 2021-10-09 ENCOUNTER — Telehealth: Payer: Self-pay | Admitting: *Deleted

## 2021-10-09 ENCOUNTER — Other Ambulatory Visit: Payer: Self-pay | Admitting: Family Medicine

## 2021-10-09 MED ORDER — ROSUVASTATIN CALCIUM 40 MG PO TABS: 40.0000 mg | ORAL_TABLET | Freq: Every day | ORAL | 0 refills | Status: DC

## 2021-10-09 NOTE — Chronic Care Management (AMB) (Signed)
°  Chronic Care Management   Outreach Note  10/09/2021 Name: Caitlin [name] MRN: [id number] DOB: 1945-12-31  Caitlin [name] is a 76 y.o. year old female who is a primary care patient of Cox, Kirsten, MD. I reached out to [employer] by phone today in response to a referral sent by [name] primary care provider.  A telephone outreach was attempted today. The patient was referred to the case management team for assistance with care management and care coordination.   Follow Up Plan: The care management team will reach out to the patient again over the next 7 days.  If patient returns call to provider office, please advise to call [facility]* at [facility]  Direct Dial: [phone]

## 2021-10-10 ENCOUNTER — Encounter: Payer: Self-pay | Admitting: Physical Therapy

## 2021-10-10 ENCOUNTER — Other Ambulatory Visit: Payer: Self-pay

## 2021-10-10 ENCOUNTER — Ambulatory Visit: Payer: Medicare Other | Attending: Neurology | Admitting: Physical Therapy

## 2021-10-10 DIAGNOSIS — M542 Cervicalgia: Secondary | ICD-10-CM | POA: Insufficient documentation

## 2021-10-10 DIAGNOSIS — G4733 Obstructive sleep apnea (adult) (pediatric): Secondary | ICD-10-CM | POA: Insufficient documentation

## 2021-10-10 DIAGNOSIS — G959 Disease of spinal cord, unspecified: Secondary | ICD-10-CM | POA: Insufficient documentation

## 2021-10-10 DIAGNOSIS — E1142 Type 2 diabetes mellitus with diabetic polyneuropathy: Secondary | ICD-10-CM | POA: Diagnosis not present

## 2021-10-10 DIAGNOSIS — M5416 Radiculopathy, lumbar region: Secondary | ICD-10-CM | POA: Insufficient documentation

## 2021-10-10 DIAGNOSIS — R2681 Unsteadiness on feet: Secondary | ICD-10-CM | POA: Insufficient documentation

## 2021-10-10 DIAGNOSIS — M545 Low back pain, unspecified: Secondary | ICD-10-CM | POA: Diagnosis present

## 2021-10-10 DIAGNOSIS — M6281 Muscle weakness (generalized): Secondary | ICD-10-CM | POA: Diagnosis present

## 2021-10-10 DIAGNOSIS — R269 Unspecified abnormalities of gait and mobility: Secondary | ICD-10-CM | POA: Diagnosis not present

## 2021-10-10 DIAGNOSIS — G8929 Other chronic pain: Secondary | ICD-10-CM | POA: Diagnosis present

## 2021-10-10 DIAGNOSIS — G2581 Restless legs syndrome: Secondary | ICD-10-CM | POA: Insufficient documentation

## 2021-10-10 NOTE — Therapy (Signed)
[facility] [address]  [place], [place], [address] Phone: [phone]   Fax:  [phone]  Physical Therapy Evaluation  Patient Details  Name: Caitlin [name] MRN: [id number] Date of Birth: [birth year] Referring Provider (PT): Marcial Pacas MD   Encounter Date: 10/10/2021   PT End of Session - 10/10/21 1257     Visit Number 1    Date for PT Re-Evaluation 12/05/21    Authorization Type [employer] MCR    Progress Note Due on Visit 10    PT Start Time 1300    PT Stop Time 0354    PT Time Calculation (min) 48 min    Activity Tolerance Patient tolerated treatment well;Other (comment)    Behavior During Therapy WFL for tasks assessed/performed             Past Medical History:  Diagnosis Date   Arthritis    Asthma    Atrial fibrillation ([place])    Cauda equina syndrome ([place]) 6/56/8127   Complication of anesthesia    COPD (chronic obstructive pulmonary disease) (HCC)    Depression    Diabetes mellitus without complication (HCC)    Type II   Diabetic neuropathy, type II diabetes mellitus ([place]) 12/13/2019   Dysrhythmia    GERD (gastroesophageal reflux disease)    HTN (hypertension)    Hypercholesteremia    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[place]) 12/13/2019   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 Small internal hemorrhoids. Otherwise normal colonosopy.    LUMBAR WOUND  DEBRIDEMENT N/A 08/09/2019   Procedure: LUMBAR WOUND DEBRIDEMENT Evacuation of  EPIDURAL HEMATOMA;  Surgeon: Newman Pies, MD;  Location: [facility];  Service: Neurosurgery;  Laterality: N/A;    There were no vitals filed for this visit.    Subjective Assessment - 10/10/21 1302     Subjective My balance is really bad. I have dizziness sometimes. I  don't have a lot of strength in my extremities. 09/26/20 she fell after tripping over a uneven walkway. The dizziness started after that. pain with sit to stand and walking in low back.    Pertinent History DM, Afib, lumbar radiculopathy, peripheral neuropathy in her feet mostly at night; lumbar fusion 08/04/19    How long can you sit comfortably? no limitations    How long can you stand comfortably? one hour    How long can you walk comfortably? 5 min    Diagnostic tests MRI:    Patient Stated Goals increase my stamina; improve my gait and walk normally; see if it will help with my dizziness    Currently in Pain? Yes    Pain Score 4     Pain Location Back    Pain Orientation Right;Left;Lower    Pain Type Chronic pain    Pain Onset More than a month ago    Pain Frequency Intermittent    Aggravating Factors  walking  Pain Relieving Factors sitting    Multiple Pain Sites Yes    Pain Score 2    Pain Location Neck    Pain Orientation Right;Left    Pain Descriptors / Indicators Aching    Pain Type Chronic pain    Pain Onset More than a month ago    Pain Frequency Intermittent    Aggravating Factors  poor posture    Pain Relieving Factors tylenol                [facility] PT Assessment - 10/10/21 0001       Assessment   Medical Diagnosis restless leg syndrome; cervical myelopathy; gait abnormality    Referring Provider (PT) Marcial Pacas MD    Onset Date/Surgical Date 09/26/20    Hand Dominance Right      Precautions   Precautions None      Restrictions   Weight Bearing Restrictions No      Balance Screen   Has the patient fallen  in the past 6 months No    Has the patient had a decrease in activity level because of a fear of falling?  Yes    Is the patient reluctant to leave their home because of a fear of falling?  Yes      [place] Private residence    Living Arrangements Spouse/significant other    Type of [place] to enter    Entrance Stairs-Number of Steps 2    Entrance Stairs-Rails Right    [place] One level    [place] - single point    Additional Comments uses SPC as needed      Prior Function   Level of Independence Independent    Vocation Retired      Observation/Other Assessments   Focus on Therapeutic Outcomes (FOTO)  Neck: 39 (predicted 51)      Posture/Postural Control   Posture/Postural Control Postural limitations    Postural Limitations Rounded Shoulders;Forward head;Increased thoracic kyphosis      ROM / Strength   AROM / PROM / Strength AROM;Strength      AROM   AROM Assessment Site Cervical;Lumbar    Cervical Flexion full    Cervical Extension 18    Cervical - Right Side Bend 12    Cervical - Left Side Bend 6    Cervical - Right Rotation 41    Cervical - Left Rotation 40    Lumbar Flexion unable to do in standing due to dizziness; seated hands to shins due to dizziness    Lumbar Extension WFL    Lumbar - Right Side Bend 25%    Lumbar - Left Side Bend 25%    Lumbar - Right Rotation 50% with dizziness    Lumbar - Left Rotation 50%      Strength   Overall Strength Comments pain with bil shoulder flex/aBD    Strength Assessment Site Hip;Knee;Shoulder    Right/Left Shoulder Right;Left    Right Shoulder Flexion 4-/5    Right Shoulder Extension 4/5    Right Shoulder ABduction 4-/5    Right Shoulder Internal Rotation 5/5    Right Shoulder External Rotation 4+/5    Left Shoulder Flexion 4/5    Left Shoulder Extension 4+/5    Left Shoulder ABduction 4/5    Left Shoulder Internal Rotation 5/5    Left Shoulder  External Rotation 4-/5    Right/Left Hip Right;Left  Right Hip Flexion 4-/5    Right Hip Extension 4/5    Right Hip ABduction 5/5   hooklying   Right Hip ADduction 4-/5   hooklying   Left Hip Flexion 3+/5    Left Hip Extension 4/5    Left Hip ABduction 5/5   hooklying   Left Hip ADduction 4-/5   hooklying   Right/Left Knee Right;Left    Right Knee Flexion 4-/5    Right Knee Extension 4/5    Left Knee Flexion 3+/5    Left Knee Extension 4/5      Flexibility   Soft Tissue Assessment /Muscle Length yes    Hamstrings marked bil R >L    Piriformis marked bil      Transfers   Five time sit to stand comments  31 sec      Ambulation/Gait   Ambulation/Gait Yes    Ambulation/Gait Assistance 5: Supervision    Ambulation Distance (Feet) 40 Feet    Assistive device None    Gait Pattern Wide base of support;Trunk flexed    Ambulation Surface Level    Gait velocity cautious at first                        Objective measurements completed on examination: See above findings.                PT Education - 10/10/21 1650     Education Details HEP; POC; next visit assessment discussed    Person(s) Educated Patient    Methods Explanation;Demonstration;Handout    Comprehension Verbalized understanding;Returned demonstration              PT Short Term Goals - 10/10/21 1651       PT SHORT TERM GOAL #1   Title Vestibular and balance assemssments completed and goals set.    Time 1    Period Weeks    Status New    Target Date 10/17/21      PT SHORT TERM GOAL #2   Title ind with initial HEP    Time 2    Period Weeks    Status New    Target Date 10/24/21               PT Long Term Goals - 10/10/21 1652       PT LONG TERM GOAL #1   Title Ind with advanced HEP    Time 8    Period Weeks    Status New    Target Date 12/05/21      PT LONG TERM GOAL #2   Title Improved 5x sit to stand to 21 sec or less to decrease fall risk    Baseline 31  sec    Time 8    Period Weeks    Status New      PT LONG TERM GOAL #3   Title Patient to demonstrate improved Bil LE strenght to 4+/5 or better to help her perform functional transfers independently.    Time 8    Period Weeks    Status New      PT LONG TERM GOAL #4   Title Pt to demonstrate improved cervical extension to [facility] allowing patient to perform [place] ADLS.    Time 8    Period Weeks    Status New      PT LONG TERM GOAL #5   Title Patient able to safely ambulate community distances indoor/outdoor and level and unlevel surfaces  with least restrictive AD.    Time 8    Period Weeks    Status New      Additional Long Term Goals   Additional Long Term Goals Yes      PT LONG TERM GOAL #6   Title improved FOTO to 51 from 63 showing functional improvement in her neck.    Time 8    Period Weeks    Status New                    Plan - 10/10/21 5701     Clinical Impression Statement Patient presents with complaints of dizziness,unsteadiness with gait and generalized weakness following a fall in February 2022 where she hit her head. She reports no falls in the past six months, but has a decreased activity level due to a fear of falling. She gets dizzy when bending forward or squatting while working in her garden. She has a h/o low back pain and lumbar fusion, cervical meylopathy and peripheral neuropathy in her feet due to T2DM. She states her neuropathy is mainly at night. Her 5x sit to stand is 31 sec indicating she is a significant fall risk. She was able to stand with her eyes closed x 10 sec without difficulty. Uon standing she must steady herself and widen her BOS before ambulating. With gait she uses a wide base of support to balance herself. She has significant weakness in bil LE and her shoulders as well. She reports neck and low back pain with ADLs. She is limited with walking to 5 min due to her back pain. She did not have an AD today but uses a cane as needed. PT  advised she use her cane in the community. Her neck pain is worse with poor posture and neck flexion. She has significant ROM deficits in her neck affecting ADLS and is very tight in her spine and hips affecting gait and functional mobility. She is unable to independently perform supine to sit (min A x 1) and could not roll to her sides due to dizziness today. She will benefit from skilled PT to address these deficits. She will also benefit from further assessment of her balance and vesibular system at her next visit to further assess fall risk.    Personal Factors and Comorbidities Comorbidity 3+;Time since onset of injury/illness/exacerbation    Comorbidities DM, afib, HTN, neuropathy, LBP, cervical myelopathy    Examination-Activity Limitations Bend;Carry;Lift;Locomotion Level;Transfers    Examination-Participation Restrictions Community Activity    Stability/Clinical Decision Making Evolving/Moderate complexity    Clinical Decision Making Moderate    Rehab Potential Good    PT Frequency 2x / week    PT Duration 8 weeks    PT Treatment/Interventions ADLs/Self Care Home Management;Aquatic Therapy;Cryotherapy;Electrical Stimulation;Moist Heat;Traction;Neuromuscular re-education;Balance training;Therapeutic exercise;Therapeutic activities;Functional mobility training;Gait training;Stair training;Patient/family education;Manual techniques;Dry needling;Vestibular;Joint Manipulations;Spinal Manipulations    PT Next Visit Plan assess vestibular; further assess balance (BERG and/or DGI) and set appropriate goals; general strength and ROM (neck/thoracic/lumbar/hips)    PT Home Exercise Plan [email or web address]    Consulted and Agree with Plan of Care Patient             Patient will benefit from skilled therapeutic intervention in order to improve the following deficits and impairments:  Abnormal gait, Decreased range of motion, Increased muscle spasms, Pain, Decreased activity tolerance, Decreased balance,  Impaired flexibility, Postural dysfunction, Impaired sensation, Decreased strength  Visit Diagnosis: Unsteadiness on feet  Muscle weakness (generalized)  Cervicalgia  Chronic  bilateral low back pain without sciatica     Problem List Patient Active Problem List   Diagnosis Date Noted   Acquired thrombophilia ([place]) 10/06/2021   Cervical myelopathy ([place]) 09/06/2021   Lumbar radiculopathy 09/06/2021   Mixed hyperlipidemia 07/14/2021   Mild recurrent major depression ([place]) 07/14/2021   Aortic atherosclerosis ([place]) 07/14/2021   Arthritis 07/05/2021   Asthma 07/05/2021   Dysrhythmia 07/05/2021   PONV (postoperative nausea and vomiting) 07/05/2021   Gait abnormality 07/02/2021   Neck pain 07/02/2021   Restless leg syndrome 07/02/2021   Bunion, right 10/02/2020   Fissure in skin of both feet 10/02/2020   Onychomycosis due to dermatophyte 10/02/2020   Gastroesophageal reflux disease without esophagitis 06/19/2020   Anxiety 06/19/2020   Hypertension associated with diabetes ([place]) 05/03/2020   Encounter for annual wellness visit (AWV) in Medicare patient 04/06/2020   Class 2 severe obesity due to excess calories with serious comorbidity and body mass index (BMI) of 37.0 to 37.9 in adult ([place]) 12/13/2019   Atrial fibrillation ([place]) 12/13/2019   Type 2 diabetes mellitus with diabetic neuropathy, with long-term current use of insulin ([place]) 93/81/8299   Complication of anesthesia 12/13/2019   Pain in both lower extremities 11/08/2019   Iron deficiency anemia 11/08/2019   Spondylosis without myelopathy or radiculopathy, lumbar region 07/02/2017   Vitamin D deficiency 01/15/2017   Obstructive sleep apnea 03/31/2015    Madelyn Flavors, PT 10/10/2021, 4:59 PM  [facility] [address]  [place], [place], [address] Phone: [phone]   Fax:  [phone]  Name: Caitlin [name] MRN: [id number] Date of Birth: 03-Jan-1946

## 2021-10-10 NOTE — Patient Instructions (Signed)
Access Code: [email or web address] URL: [email or web address] Date: 10/10/2021 Prepared by: Almyra Free  Exercises Sit to Stand - 3 x daily - 7 x weekly - 1-2 sets - 5 reps Supine Lower Trunk Rotation - 2 x daily - 7 x weekly - 1 sets - 5 reps - 10 sec hold Supine Bridge - 2 x daily - 7 x weekly - 1-3 sets - 10 reps

## 2021-10-11 ENCOUNTER — Telehealth: Payer: Self-pay | Admitting: Neurology

## 2021-10-11 ENCOUNTER — Encounter: Payer: Self-pay | Admitting: Physical Therapy

## 2021-10-11 ENCOUNTER — Ambulatory Visit: Payer: Medicare Other | Admitting: Physical Therapy

## 2021-10-11 DIAGNOSIS — G8929 Other chronic pain: Secondary | ICD-10-CM

## 2021-10-11 DIAGNOSIS — R2681 Unsteadiness on feet: Secondary | ICD-10-CM | POA: Diagnosis not present

## 2021-10-11 DIAGNOSIS — M542 Cervicalgia: Secondary | ICD-10-CM

## 2021-10-11 DIAGNOSIS — M6281 Muscle weakness (generalized): Secondary | ICD-10-CM

## 2021-10-11 NOTE — Telephone Encounter (Signed)
Pt would like a call from the nurse to discuss information left off the form.

## 2021-10-11 NOTE — Patient Instructions (Signed)
Access Code: [email or web address] URL: [email or web address] Date: 10/11/2021 Prepared by: Glenetta Hew  Exercises Seated Gaze Stabilization with Head Rotation - 3 x daily - 7 x weekly - 3 sets - 10 reps Seated Gaze Stabilization with Head Nod - 3 x daily - 7 x weekly - 3 sets - 10 reps

## 2021-10-11 NOTE — Telephone Encounter (Signed)
I spoke to the patient. She is aware her records have been faxed to the requested party.

## 2021-10-11 NOTE — Therapy (Signed)
[facility] [address]  [place], [place], [address] Phone: [phone]   Fax:  [phone]  Physical Therapy Treatment  Patient Details  Name: Caitlin [name] MRN: [id number] Date of Birth: 02/25/1946 Referring Provider (PT): Marcial Pacas MD   Encounter Date: 10/11/2021   PT End of Session - 10/11/21 1120     Visit Number 2    Number of Visits 16    Date for PT Re-Evaluation 12/05/21    Authorization Type [employer] MCR    Progress Note Due on Visit 10    PT Start Time 1017    PT Stop Time 1110    PT Time Calculation (min) 53 min    Activity Tolerance Patient tolerated treatment well    Behavior During Therapy [facility] for tasks assessed/performed             Past Medical History:  Diagnosis Date   Arthritis    Asthma    Atrial fibrillation ([place])    Cauda equina syndrome ([place]) 8/58/8502   Complication of anesthesia    COPD (chronic obstructive pulmonary disease) ([place])    Depression    Diabetes mellitus without complication ([place])    Type II   Diabetic neuropathy, type II diabetes mellitus ([place]) 12/13/2019   Dysrhythmia    GERD (gastroesophageal reflux disease)    HTN (hypertension)    Hypercholesteremia    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[place]) 12/13/2019   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[employer]) 12/13/2019   Morbid obesity ([place]) 12/13/2019   Obstructive sleep apnea    PONV (postoperative nausea and vomiting)    "not the last few times"   RLS (restless legs syndrome)    S/P ablation of atrial flutter 02/12/2016   S/P lumbar fusion 08/04/2019   Sleep apnea    Upper respiratory tract infection due to COVID-19 virus 08/14/2021    Past Surgical History:  Procedure Laterality Date   ATRIAL FIBRILLATION ABLATION     x3   COLONOSCOPY  01/04/2013   Moderate predominantly sigmoid diverticulosis. Small internal hemorrhoids. Otherwise normal colonosopy.    LUMBAR WOUND  DEBRIDEMENT N/A 08/09/2019   Procedure: LUMBAR WOUND DEBRIDEMENT Evacuation of  EPIDURAL HEMATOMA;  Surgeon: Newman Pies, MD;  Location: [facility];  Service: Neurosurgery;  Laterality: N/A;    There were no vitals filed for this visit.   Subjective Assessment - 10/11/21 1119     Subjective Pt. reports that her problems started when she fell and hit her head.  She had a CT at the time which was negative, but since then notices more and more difficulty with dizziness.    Pertinent History DM, Afib, lumbar radiculopathy, peripheral neuropathy in her feet mostly at night; lumbar fusion 08/04/19    How long can you sit comfortably? no limitations    How long can you stand comfortably? one hour    How long can you walk comfortably? 5 min    Diagnostic tests MRI:    Patient Stated Goals increase my stamina; improve my gait and walk normally; see if it will help with my dizziness    Currently in Pain? Yes    Pain Score 4     Pain Location Back    Pain Onset More than a month ago    Pain Onset More than a month ago                [facility] PT Assessment - 10/11/21 0001  Balance   Balance Assessed Yes      Standardized Balance Assessment   Standardized Balance Assessment Berg Balance Test      Berg Balance Test   Sit to Stand Needs minimal aid to stand or to stabilize   due to dizziness   Standing Unsupported Able to stand safely 2 minutes    Sitting with Back Unsupported but Feet Supported on Floor or Stool Able to sit safely and securely 2 minutes    Stand to Sit Controls descent by using hands    Transfers Able to transfer safely, definite need of hands    Standing Unsupported with Eyes Closed Unable to keep eyes closed 3 seconds but stays steady    Standing Unsupported with Feet Together Needs help to attain position and unable to hold for 15 seconds    From Standing, Reach Forward with Outstretched Arm Can reach forward >5 cm safely (2")    From Standing Position, Pick up  Object from Floor Unable to pick up and needs supervision    From Standing Position, Turn to Look Behind Over each Shoulder Turn sideways only but maintains balance    Turn 360 Degrees Needs assistance while turning    Standing Unsupported, Alternately Place Feet on Step/Stool Able to complete >2 steps/needs minimal assist    Standing Unsupported, One Foot in [employer] balance while stepping or standing   only able to hold for 5 sec in semitandem   Standing on One Leg Tries to lift leg/unable to hold 3 seconds but remains standing independently    Total Score 23    Berg comment: high risk for falls, AD recommended                 Vestibular Assessment - 10/11/21 0001       Vestibular Assessment   General Observation enters independently in no apparent distress.      Symptom Behavior   Subjective history of current problem dizziness throughout the day, worse in the morning when I get up.  I fell last year and hit my head, I felt ok, but then as the months progressed feeling off balance and dizzy.    Type of Dizziness  Lightheadedness    Frequency of Dizziness every day throughout the day    Duration of Dizziness constant    Symptom Nature Constant    Aggravating Factors Turning body quickly;Turning head quickly;Supine to sit;Sit to stand    Relieving Factors No known relieving factors    Progression of Symptoms Worse    History of similar episodes none      Oculomotor Exam   Oculomotor Alignment Normal    Ocular ROM WNL    Spontaneous Right beating nystagmus    Head shaking Horizontal Comment   large saccade to L with L head turn   Head Shaking Vertical Comment   very slow gaurded neck movements but able to stay fixed on target   Smooth Pursuits Saccades    Saccades Poor trajectory      Positional Sensitivities   Nose to Right Knee Lightheadedness    Right Knee to Sitting Lightedness    Nose to Left Knee No dizziness    Left Knee to Sitting No dizziness    Head  Turning x 5 Mild dizziness    Head Nodding x 5 Lightheadedness                      [facility] Adult PT Treatment/Exercise - 10/11/21  0001       Ambulation/Gait   Ambulation/Gait Yes    Ambulation/Gait Assistance 4: Min guard    Ambulation Distance (Feet) 180 Feet    Assistive device Rolling walker    Gait Pattern Step-through pattern    Ambulation Surface Level    Gait Comments improved steadiness and confidence with rolling walker.      Self-Care   Self-Care Other Self-Care Comments    Other Self-Care Comments  education on fall risk, need for assistive device, how to decrease dizziness in morning due to orthostasis (sit up slowly, sit EOB x 2 min, drink glass of water, pump hands in standing)      Therapeutic Activites    Therapeutic Activities Other Therapeutic Activities    Other Therapeutic Activities Berg balance test and vestibular assessment.             Vestibular Treatment/Exercise - 10/11/21 0001       Vestibular Treatment/Exercise   Vestibular Treatment Provided Habituation    Gaze Exercises X1 Viewing Horizontal;X1 Viewing Vertical      X1 Viewing Horizontal   Foot Position seated    Reps 30    Comments mild dizziness, difficulty focusing when turning head to L      X1 Viewing Vertical   Foot Position seated    Reps 30    Comments mild dizziness                    PT Education - 10/11/21 1128     Education Details initial HEP for VOR exercises Access Code: [email or web address], fall safety    Person(s) Educated Patient    Methods Explanation;Demonstration;Verbal cues;Handout    Comprehension Verbalized understanding;Returned demonstration              PT Short Term Goals - 10/11/21 1124       PT SHORT TERM GOAL #1   Title Vestibular and balance assemssments completed and goals set.    Time 1    Period Weeks    Status Achieved   10/11/21   Target Date 10/17/21      PT SHORT TERM GOAL #2   Title ind with initial HEP    Time 2     Period Weeks    Status On-going   10/11/21- given VOR x 1 seated exercises.   Target Date 10/24/21               PT Long Term Goals - 10/11/21 1125       PT LONG TERM GOAL #1   Title Ind with advanced HEP    Time 8    Period Weeks    Status On-going    Target Date 12/05/21      PT LONG TERM GOAL #2   Title Improved 5x sit to stand to 21 sec or less to decrease fall risk    Baseline 31 sec    Time 8    Period Weeks    Status On-going      PT LONG TERM GOAL #3   Title Patient to demonstrate improved Bil LE strenght to 4+/5 or better to help her perform functional transfers independently.    Time 8    Period Weeks    Status On-going      PT LONG TERM GOAL #4   Title Pt to demonstrate improved cervical extension to [facility] allowing patient to perform [place] ADLS.    Time 8    Period Weeks  Status On-going      PT LONG TERM GOAL #5   Title Patient able to safely ambulate community distances indoor/outdoor and level and unlevel surfaces with least restrictive AD.    Time 8    Period Weeks    Status On-going      Additional Long Term Goals   Additional Long Term Goals Yes      PT LONG TERM GOAL #6   Title improved FOTO to 51 from 62 showing functional improvement in her neck.    Time 8    Period Weeks    Status On-going      PT LONG TERM GOAL #7   Title Pt. will demonstrate improved balance on BERG by at least 8 points to decrease risk of falls.    Baseline 23/56    Time 8    Period Weeks    Status New    Target Date 12/05/21      PT LONG TERM GOAL #8   Title Pt. will report 50% improvement in dizziness symptoms.    Time 8    Period Weeks    Status New    Target Date 12/05/21                   Plan - 10/11/21 1120     Clinical Impression Statement Patient was assessed today for vestibular impairments and balance.  She reports increased dizziness first thing in morning, so educated on orthostatic hypotension and different strategies to improve  safety.  She also reports almost constant lightheadedness/dizziness throughout the day, especially with any head movements.  Noted today she has a constant nystagmus at rest, and large saccades when turning head to L, unable to maintain focus on static object.  Her balance is also significantly impaired, scoring 23/56 demonstrating higher risk for falls and need for AD.  She has a cane at home, and participated in trial of 4WRW today, noted significant improvement in both steadiness with gait and confidence with balance.  Educated on fall safety and need for AD at all times to prevent falls, due to increased risk of injury with falls.  Given VOR x 1 gaze stabilization exercises today, to perform seated for safety.    Personal Factors and Comorbidities Comorbidity 3+;Time since onset of injury/illness/exacerbation    Comorbidities DM, afib, HTN, neuropathy, LBP, cervical myelopathy    Examination-Activity Limitations Bend;Carry;Lift;Locomotion Level;Transfers    Examination-Participation Restrictions Community Activity    Stability/Clinical Decision Making Evolving/Moderate complexity    Rehab Potential Good    PT Frequency 2x / week    PT Duration 8 weeks    PT Treatment/Interventions ADLs/Self Care Home Management;Aquatic Therapy;Cryotherapy;Electrical Stimulation;Moist Heat;Traction;Neuromuscular re-education;Balance training;Therapeutic exercise;Therapeutic activities;Functional mobility training;Gait training;Stair training;Patient/family education;Manual techniques;Dry needling;Vestibular;Joint Manipulations;Spinal Manipulations    PT Next Visit Plan VOR exercises, standing balance exercises, general strength and ROM, gait training RW and SPC    PT Home Exercise Plan [email or web address]    Consulted and Agree with Plan of Care Patient             Patient will benefit from skilled therapeutic intervention in order to improve the following deficits and impairments:  Abnormal gait, Decreased range of  motion, Increased muscle spasms, Pain, Decreased activity tolerance, Decreased balance, Impaired flexibility, Postural dysfunction, Impaired sensation, Decreased strength  Visit Diagnosis: Unsteadiness on feet  Muscle weakness (generalized)  Cervicalgia  Chronic bilateral low back pain without sciatica     Problem List Patient Active Problem List   Diagnosis Date  Noted   Acquired thrombophilia ([place]) 10/06/2021   Cervical myelopathy ([place]) 09/06/2021   Lumbar radiculopathy 09/06/2021   Mixed hyperlipidemia 07/14/2021   Mild recurrent major depression ([place]) 07/14/2021   Aortic atherosclerosis ([place]) 07/14/2021   Arthritis 07/05/2021   Asthma 07/05/2021   Dysrhythmia 07/05/2021   PONV (postoperative nausea and vomiting) 07/05/2021   Gait abnormality 07/02/2021   Neck pain 07/02/2021   Restless leg syndrome 07/02/2021   Bunion, right 10/02/2020   Fissure in skin of both feet 10/02/2020   Onychomycosis due to dermatophyte 10/02/2020   Gastroesophageal reflux disease without esophagitis 06/19/2020   Anxiety 06/19/2020   Hypertension associated with diabetes ([place]) 05/03/2020   Encounter for annual wellness visit (AWV) in Medicare patient 04/06/2020   Class 2 severe obesity due to excess calories with serious comorbidity and body mass index (BMI) of 37.0 to 37.9 in adult [facility]) 12/13/2019   Atrial fibrillation ([place]) 12/13/2019   Type 2 diabetes mellitus with diabetic neuropathy, with long-term current use of insulin ([place]) 77/41/2878   Complication of anesthesia 12/13/2019   Pain in both lower extremities 11/08/2019   Iron deficiency anemia 11/08/2019   Spondylosis without myelopathy or radiculopathy, lumbar region 07/02/2017   Vitamin D deficiency 01/15/2017   Obstructive sleep apnea 03/31/2015    Rennie Natter, PT, DPT  10/11/2021, 11:31 AM  [facility] [address]  [place], [place], [address] Phone:  [phone]   Fax:  [phone]  Name: Merrilee Ancona MRN: [id number] Date of Birth: Aug 03, 1946

## 2021-10-15 ENCOUNTER — Other Ambulatory Visit: Payer: Self-pay | Admitting: Family Medicine

## 2021-10-16 ENCOUNTER — Other Ambulatory Visit: Payer: Self-pay | Admitting: Physician Assistant

## 2021-10-16 ENCOUNTER — Telehealth: Payer: Medicare Other

## 2021-10-16 NOTE — Chronic Care Management (AMB) (Signed)
°  Chronic Care Management   Outreach Note  10/16/2021 Name: Caitlin [name] MRN: [id number] DOB: Jun 08, 1946  Caitlin [name] is a 76 y.o. year old female who is a primary care patient of Cox, Kirsten, MD. I reached out to [employer] by phone today in response to a referral sent by [name] primary care provider.  A second unsuccessful telephone outreach was attempted today. The patient was referred to the case management team for assistance with care management and care coordination.   Follow Up Plan: The care management team will reach out to the patient again over the next 7 days.  If patient returns call to provider office, please advise to call [facility] * at [phone].*  [facility]  Direct Dial: [phone]

## 2021-10-16 NOTE — Telephone Encounter (Signed)
Patient also called. Has not been filled by [place] previously.   Caitlin [name], [place] 10/16/21 10:26 AM

## 2021-10-17 ENCOUNTER — Ambulatory Visit: Payer: Medicare Other | Admitting: Physical Therapy

## 2021-10-23 NOTE — Chronic Care Management (AMB) (Signed)
?  Chronic Care Management  ? ?Outreach Note ? ?10/23/2021 ?Name: Caitlin [name] MRN: [id number] DOB: August 03, 1946 ? ?Caitlin [name] is a 76 y.o. year old female who is a primary care patient of Cox, Kirsten, MD. I reached out to [employer] by phone today in response to a referral sent by [name] primary care provider. ? ?Third unsuccessful telephone outreach was attempted today. The patient was referred to the case management team for assistance with care management and care coordination. The patient's primary care provider has been notified of our unsuccessful attempts to make or maintain contact with the patient. The care management team is pleased to engage with this patient at any time in the future should he/she be interested in assistance from the care management team.  ? ?Follow Up Plan: We have been unable to make contact with the patient. The care management team is available to follow up with the patient after provider conversation with the patient regarding recommendation for care management engagement and subsequent re-referral to the care management team. A HIPAA compliant phone message was left for the patient providing contact information and requesting a return call.  ? ?Laverda Sorenson  ?Care Guide, Embedded Care Coordination ?[facility]  Care Management  ?Direct Dial: [phone] ? ?

## 2021-10-24 ENCOUNTER — Ambulatory Visit: Payer: Medicare Other | Admitting: Physical Therapy

## 2021-10-29 ENCOUNTER — Ambulatory Visit: Payer: Medicare Other | Admitting: Neurology

## 2021-10-30 ENCOUNTER — Ambulatory Visit: Payer: Medicare Other | Attending: Neurology

## 2021-10-30 DIAGNOSIS — G8929 Other chronic pain: Secondary | ICD-10-CM | POA: Insufficient documentation

## 2021-10-30 DIAGNOSIS — R2681 Unsteadiness on feet: Secondary | ICD-10-CM | POA: Insufficient documentation

## 2021-10-30 DIAGNOSIS — M6281 Muscle weakness (generalized): Secondary | ICD-10-CM | POA: Insufficient documentation

## 2021-10-30 DIAGNOSIS — M542 Cervicalgia: Secondary | ICD-10-CM | POA: Insufficient documentation

## 2021-10-30 DIAGNOSIS — M545 Low back pain, unspecified: Secondary | ICD-10-CM | POA: Insufficient documentation

## 2021-10-31 ENCOUNTER — Ambulatory Visit: Payer: Medicare Other

## 2021-11-01 ENCOUNTER — Other Ambulatory Visit: Payer: Self-pay

## 2021-11-01 ENCOUNTER — Ambulatory Visit: Payer: Medicare Other | Admitting: Nurse Practitioner

## 2021-11-01 ENCOUNTER — Encounter: Payer: Self-pay | Admitting: Nurse Practitioner

## 2021-11-01 VITALS — BP 156/100 | HR 94 | Temp 97.3°F | Ht 63.0 in | Wt 197.0 lb

## 2021-11-01 DIAGNOSIS — I152 Hypertension secondary to endocrine disorders: Secondary | ICD-10-CM | POA: Diagnosis not present

## 2021-11-01 DIAGNOSIS — E1159 Type 2 diabetes mellitus with other circulatory complications: Secondary | ICD-10-CM | POA: Diagnosis not present

## 2021-11-01 DIAGNOSIS — I10 Essential (primary) hypertension: Secondary | ICD-10-CM | POA: Diagnosis not present

## 2021-11-01 MED ORDER — VALSARTAN 320 MG PO TABS: 320.0000 mg | ORAL_TABLET | Freq: Every day | ORAL | 3 refills | Status: DC

## 2021-11-01 NOTE — Progress Notes (Signed)
? ?Subjective:  ?Patient ID: Caitlin [name], female    DOB: 06/04/1946  Age: 76 y.o. MRN: [id number] ? ?Chief Complaint  ?Patient presents with  ? 4 wk f/u HTN  ? ? ?HPI ?Caitlin [name] is a 76 year old Caucasian female that presents for follow-up of uncontrolled hypertension. She was prescribed Valsartan 160 mg by her PCP on 10/05/21, Lisinopril discontinued. States BP at home has been 150's/80's-90's. Reports cough has subsided after discontinuing Lisinopril.  ? ?Caitlin [name] has been prescribed Lyrica recently by neurologist. States she has experienced intermittent dizziness since taking Lyrica. She is now taking medication in the evening and has been referred to PT for balance/gait training.  ? ?Hypertension, follow-up:   ?She was last seen for hypertension 4 weeks ago.  ?BP at that visit was 160/100. Management since that visit includes d/c lisinopril 40 mg to Valsartan 160 mg. ? ?She reports excellent compliance with treatment. ?She is not having side effects.  ?She is following a Regular diet. ?She is not exercising. ?She does not smoke. ? ?Use of agents associated with hypertension: none.  ?Symptoms: ?No chest pain No chest pressure  ?No palpitations No syncope  ?No dyspnea No orthopnea  ?No paroxysmal nocturnal dyspnea No lower extremity edema  ? ?Pertinent labs: ?Lab Results  ?Component Value Date  ? CHOL 258 (H) 10/05/2021  ? HDL 53 10/05/2021  ? LDLCALC 183 (H) 10/05/2021  ? TRIG 125 10/05/2021  ? CHOLHDL 4.9 (H) 10/05/2021  ? Lab Results  ?Component Value Date  ? NA 142 10/05/2021  ? K 5.0 10/05/2021  ? CREATININE 0.72 10/05/2021  ? EGFR 88 10/05/2021  ? GFRNONAA 75 07/06/2020  ? GLUCOSE 91 10/05/2021  ?  ? ?The 10-year ASCVD risk score (Arnett DK, et al., 2019) is: 48.6%  ? ?Current Outpatient Medications on File Prior to Visit  ?Medication Sig Dispense Refill  ? albuterol (PROVENTIL) (2.5 MG/3ML) 0.083% nebulizer solution Take 3 mLs (2.5 mg total) by nebulization every 6 (six) hours as needed for wheezing or  shortness of breath. 75 mL 0  ? albuterol (VENTOLIN HFA) 108 (90 Base) MCG/ACT inhaler Inhale 2 puffs into the lungs every 6 (six) hours as needed for wheezing or shortness of breath. 8 g 3  ? BD PEN NEEDLE NANO 2ND GEN 32G X 4 MM MISC 1 EACH BY DOES NOT APPLY ROUTE IN THE MORNING AND AT BEDTIME. 100 each 2  ? Budeson-Glycopyrrol-Formoterol (BREZTRI AEROSPHERE) 160-9-4.8 MCG/ACT AERO Inhale 2 puffs into the lungs in the morning and at bedtime. 10.7 g 3  ? buPROPion (WELLBUTRIN XL) 300 MG 24 hr tablet Take one tablet in morning for one week then increase to two tablets. 90 tablet 1  ? calcium carbonate (TUMS - DOSED IN MG ELEMENTAL CALCIUM) 500 MG chewable tablet Chew 2 tablets by mouth daily as needed for indigestion.     ? Continuous Blood Gluc Receiver (FREESTYLE LIBRE 14 DAY READER) DEVI USE TO CHECK BLOOD SUGARS DAILY    ? Continuous Blood Gluc Sensor (FREESTYLE LIBRE 14 DAY SENSOR) MISC SMARTSIG:1 Topical Every 2 Weeks    ? Ferrous Sulfate (SLOW RELEASE IRON PO) Take by mouth daily with supper.    ? fexofenadine (ALLEGRA) 180 MG tablet Take 180 mg by mouth daily as needed for allergies or rhinitis.    ? Insulin Degludec (TRESIBA FLEXTOUCH) 200 UNIT/ML SOPN Inject 50 Units into the skin every evening.    ? Insulin Pen Needle 31G X 8 MM MISC Inject 1 Units into the skin daily.    ?  ketorolac (ACULAR) 0.5 % ophthalmic solution INSTILL 1 DROP INTO LEFT EYE FOUR TIMES A DAY AS DIRECTED    ? moxifloxacin (VIGAMOX) 0.5 % ophthalmic solution Place 1 drop into the left eye 4 (four) times daily.    ? omeprazole (PRILOSEC) 40 MG capsule TAKE 1 CAPSULE(S) BY MOUTH DAILY IN THE MORNING 90 capsule 3  ? prednisoLONE acetate (PRED FORTE) 1 % ophthalmic suspension Place 1 drop into the left eye 4 (four) times daily.    ? pregabalin (LYRICA) 100 MG capsule 1 am, one at noon, 2 qhs 360 capsule 4  ? rivaroxaban (XARELTO) 20 MG TABS tablet Take by mouth at bedtime.    ? rosuvastatin (CRESTOR) 40 MG tablet Take 1 tablet (40 mg  total) by mouth daily. 90 tablet 0  ? Semaglutide,0.25 or 0.5MG /DOS, 2 MG/1.5ML SOPN Inject 0.5 mg into the skin every Monday.     ? valsartan (DIOVAN) 160 MG tablet Take 1 tablet (160 mg total) by mouth daily. 90 tablet 3  ? VITAMIN D-VITAMIN K PO Take 1 tablet by mouth every evening.    ? ?No current facility-administered medications on file prior to visit.  ? ?Past Medical History:  ?Diagnosis Date  ? Arthritis   ? Asthma   ? Atrial fibrillation ([place])   ? Cauda equina syndrome ([place]) 12/13/2019  ? Complication of anesthesia   ? COPD (chronic obstructive pulmonary disease) ([place])   ? Depression   ? Diabetes mellitus without complication ([place])   ? Type II  ? Diabetic neuropathy, type II diabetes mellitus ([place]) 12/13/2019  ? Dysrhythmia   ? GERD (gastroesophageal reflux disease)   ? HTN (hypertension)   ? Hypercholesteremia   ? 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[place]) 12/13/2019  ? 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[place]) 12/13/2019  ? Morbid obesity ([place]) 12/13/2019  ? Obstructive sleep apnea   ? PONV (postoperative nausea and vomiting)   ? "not the last few times"  ? RLS (restless legs syndrome)   ? S/P ablation of atrial flutter 02/12/2016  ? S/P lumbar fusion 08/04/2019  ? Sleep apnea   ? Upper respiratory tract infection due to COVID-19 virus 08/14/2021  ? ?Past Surgical History:  ?Procedure Laterality Date  ? ATRIAL FIBRILLATION ABLATION    ? x3  ? COLONOSCOPY  01/04/2013  ? Moderate predominantly sigmoid diverticulosis. Small internal hemorrhoids. Otherwise normal colonosopy.   ? LUMBAR WOUND DEBRIDEMENT N/A 08/09/2019  ? Procedure: LUMBAR WOUND DEBRIDEMENT Evacuation of  EPIDURAL HEMATOMA;  Surgeon: Newman Pies, MD;  Location: [facility];  Service: Neurosurgery;  Laterality: N/A;  ?  ?Family History  ?Problem Relation Age of Onset  ? Breast cancer Mother 40  ? Heart attack Father 58  ? Mental illness Sister   ? Heart Problems Brother 81  ? Cancer Brother   ? Breast cancer  Paternal Grandmother   ? Colon cancer Paternal Aunt 50  ? ?Social History  ? ?Socioeconomic History  ? Marital status: Married  ?  Spouse name: Not on file  ? Number of children: Not on file  ? Years of education: Not on file  ? Highest education level: Not on file  ?Occupational History  ? Not on file  ?Tobacco Use  ? Smoking status: Never  ?  Passive exposure: Past  ? Smokeless tobacco: Never  ?Vaping Use  ? Vaping Use: Never used  ?Substance and Sexual Activity  ? Alcohol use: Not Currently  ? Drug use: Never  ? Sexual  activity: Not Currently  ?Other Topics Concern  ? Not on file  ?Social History Narrative  ? Not on file  ? ?Social Determinants of Health  ? ?Financial Resource Strain: Not on file  ?Food Insecurity: Not on file  ?Transportation Needs: Not on file  ?Physical Activity: Not on file  ?Stress: Not on file  ?Social Connections: Not on file  ? ? ?Review of Systems  ?Constitutional:  Negative for chills, fatigue and fever.  ?HENT:  Negative for congestion and ear pain.   ?Respiratory:  Negative for cough and shortness of breath.   ?Cardiovascular:  Negative for chest pain.  ?Gastrointestinal:  Negative for diarrhea and nausea.  ?Neurological:  Positive for dizziness. Negative for headaches.  ? ? ?Objective:  ?BP (!) 156/100   Pulse 94   Temp (!) 97.3 ?F (36.3 ?C)   Ht $R'5\' 3"'kN$  (1.6 m)   Wt 197 lb (89.4 kg)   SpO2 97%   BMI 34.90 kg/m?   ? ?BP/Weight 11/01/2021 10/05/2021 09/06/2021  ?Systolic BP - 850 277  ?Diastolic BP - 412 85  ?Wt. (Lbs) 197 201 198  ?BMI 34.9 37.98 35.07  ? ? ?Physical Exam ?Vitals reviewed.  ?HENT:  ?   Right Ear: Tympanic membrane normal.  ?   Left Ear: Tympanic membrane normal.  ?Cardiovascular:  ?   Rate and Rhythm: Normal rate and regular rhythm.  ?   Pulses: Normal pulses.  ?   Heart sounds: Normal heart sounds.  ?Pulmonary:  ?   Effort: Pulmonary effort is normal.  ?   Breath sounds: Normal breath sounds.  ?Skin: ?   General: Skin is warm and dry.  ?   Capillary Refill:  Capillary refill takes less than 2 seconds.  ?Neurological:  ?   General: No focal deficit present.  ?   Mental Status: She is alert and oriented to person, place, and time.  ? ? ?  ? ?Lab Results  ?Component Value Date  ? WBC

## 2021-11-01 NOTE — Patient Instructions (Addendum)
Increase Valsartan 320 mg  ?Notify office immediately of any side effects ?Check BP, keep log ?Return in 2-weeks, bring BP log  ?Low salt diet ? ?Managing Your Hypertension ?Hypertension, also called high blood pressure, is when the force of the blood pressing against the walls of the arteries is too strong. Arteries are blood vessels that carry blood from your heart throughout your body. Hypertension forces the heart to work harder to pump blood and may cause the arteries to become narrow or stiff. ?Understanding blood pressure readings ?Your personal target blood pressure may vary depending on your medical conditions, your age, and other factors. A blood pressure reading includes a higher number over a lower number. Ideally, your blood pressure should be below 120/80. You should know that: ?The first, or top, number is called the systolic pressure. It is a measure of the pressure in your arteries as your heart beats. ?The second, or bottom number, is called the diastolic pressure. It is a measure of the pressure in your arteries as the heart relaxes. ?Blood pressure is classified into four stages. Based on your blood pressure reading, your health care provider may use the following stages to determine what type of treatment you need, if any. Systolic pressure and diastolic pressure are measured in a unit called mmHg. ?Normal ?Systolic pressure: below 161. ?Diastolic pressure: below 80. ?Elevated ?Systolic pressure: 096-045. ?Diastolic pressure: below 80. ?Hypertension stage 1 ?Systolic pressure: 409-811. ?Diastolic pressure: 91-47. ?Hypertension stage 2 ?Systolic pressure: 829 or above. ?Diastolic pressure: 90 or above. ?How can this condition affect me? ?Managing your hypertension is an important responsibility. Over time, hypertension can damage the arteries and decrease blood flow to important parts of the body, including the brain, heart, and kidneys. Having untreated or uncontrolled hypertension can lead  to: ?A heart attack. ?A stroke. ?A weakened blood vessel (aneurysm). ?Heart failure. ?Kidney damage. ?Eye damage. ?Metabolic syndrome. ?Memory and concentration problems. ?Vascular dementia. ?What actions can I take to manage this condition? ?Hypertension can be managed by making lifestyle changes and possibly by taking medicines. Your health care provider will help you make a plan to bring your blood pressure within a normal range. ?Nutrition ? ?Eat a diet that is high in fiber and potassium, and low in salt (sodium), added sugar, and fat. An example eating plan is called the Dietary Approaches to Stop Hypertension (DASH) diet. To eat this way: ?Eat plenty of fresh fruits and vegetables. Try to fill one-half of your plate at each meal with fruits and vegetables. ?Eat whole grains, such as whole-wheat pasta, brown rice, or whole-grain bread. Fill about one-fourth of your plate with whole grains. ?Eat low-fat dairy products. ?Avoid fatty cuts of meat, processed or cured meats, and poultry with skin. Fill about one-fourth of your plate with lean proteins such as fish, chicken without skin, beans, eggs, and tofu. ?Avoid pre-made and processed foods. These tend to be higher in sodium, added sugar, and fat. ?Reduce your daily sodium intake. Most people with hypertension should eat less than 1,500 mg of sodium a day. ?Lifestyle ? ?Work with your health care provider to maintain a healthy body weight or to lose weight. Ask what an ideal weight is for you. ?Get at least 30 minutes of exercise that causes your heart to beat faster (aerobic exercise) most days of the week. Activities may include walking, swimming, or biking. ?Include exercise to strengthen your muscles (resistance exercise), such as weight lifting, as part of your weekly exercise routine. Try to do  these types of exercises for 30 minutes at least 3 days a week. ?Do not use any products that contain nicotine or tobacco, such as cigarettes, e-cigarettes, and  chewing tobacco. If you need help quitting, ask your health care provider. ?Control any long-term (chronic) conditions you have, such as high cholesterol or diabetes. ?Identify your sources of stress and find ways to manage stress. This may include meditation, deep breathing, or making time for fun activities. ?Alcohol use ?Do not drink alcohol if: ?Your health care provider tells you not to drink. ?You are pregnant, may be pregnant, or are planning to become pregnant. ?If you drink alcohol: ?Limit how much you use to: ?0-1 drink a day for women. ?0-2 drinks a day for men. ?Be aware of how much alcohol is in your drink. In the [place] one drink equals one 12 oz bottle of beer (355 mL), one 5 oz glass of wine (148 mL), or one 1? oz glass of hard liquor (44 mL). ?Medicines ?Your health care provider may prescribe medicine if lifestyle changes are not enough to get your blood pressure under control and if: ?Your systolic blood pressure is 130 or higher. ?Your diastolic blood pressure is 80 or higher. ?Take medicines only as told by your health care provider. Follow the directions carefully. Blood pressure medicines must be taken as told by your health care provider. The medicine does not work as well when you skip doses. Skipping doses also puts you at risk for problems. ?Monitoring ?Before you monitor your blood pressure: ?Do not smoke, drink caffeinated beverages, or exercise within 30 minutes before taking a measurement. ?Use the bathroom and empty your bladder (urinate). ?Sit quietly for at least 5 minutes before taking measurements. ?Monitor your blood pressure at home as told by your health care provider. To do this: ?Sit with your back straight and supported. ?Place your feet flat on the floor. Do not cross your legs. ?Support your arm on a flat surface, such as a table. Make sure your upper arm is at heart level. ?Each time you measure, take two or three readings one minute apart and record the results. ?You may  also need to have your blood pressure checked regularly by your health care provider. ?General information ?Talk with your health care provider about your diet, exercise habits, and other lifestyle factors that may be contributing to hypertension. ?Review all the medicines you take with your health care provider because there may be side effects or interactions. ?Keep all visits as told by your health care provider. Your health care provider can help you create and adjust your plan for managing your high blood pressure. ?Where to find more information ?National Heart, Lung, and Blood Institute: https://wilson-eaton.com/ ?American Heart Association: www.heart.org ?Contact a health care provider if: ?You think you are having a reaction to medicines you have taken. ?You have repeated (recurrent) headaches. ?You feel dizzy. ?You have swelling in your ankles. ?You have trouble with your vision. ?Get help right away if: ?You develop a severe headache or confusion. ?You have unusual weakness or numbness, or you feel faint. ?You have severe pain in your chest or abdomen. ?You vomit repeatedly. ?You have trouble breathing. ?These symptoms may represent a serious problem that is an emergency. Do not wait to see if the symptoms will go away. Get medical help right away. Call your local emergency services (911 in the [place] Do not drive yourself to the hospital. ?Summary ?Hypertension is when the force of blood pumping through your arteries  is too strong. If this condition is not controlled, it may put you at risk for serious complications. ?Your personal target blood pressure may vary depending on your medical conditions, your age, and other factors. For most people, a normal blood pressure is less than 120/80. ?Hypertension is managed by lifestyle changes, medicines, or both. ?Lifestyle changes to help manage hypertension include losing weight, eating a healthy, low-sodium diet, exercising more, stopping smoking, and limiting  alcohol. ?This information is not intended to replace advice given to you by your health care provider. Make sure you discuss any questions you have with your health care provider. ?Document Revised: 08/23/2019 Do

## 2021-11-05 ENCOUNTER — Encounter: Payer: Self-pay | Admitting: Physical Therapy

## 2021-11-05 ENCOUNTER — Ambulatory Visit: Payer: Medicare Other | Admitting: Physical Therapy

## 2021-11-05 ENCOUNTER — Other Ambulatory Visit: Payer: Self-pay

## 2021-11-05 DIAGNOSIS — G8929 Other chronic pain: Secondary | ICD-10-CM | POA: Diagnosis present

## 2021-11-05 DIAGNOSIS — R2681 Unsteadiness on feet: Secondary | ICD-10-CM

## 2021-11-05 DIAGNOSIS — M542 Cervicalgia: Secondary | ICD-10-CM

## 2021-11-05 DIAGNOSIS — M545 Low back pain, unspecified: Secondary | ICD-10-CM | POA: Diagnosis present

## 2021-11-05 DIAGNOSIS — M6281 Muscle weakness (generalized): Secondary | ICD-10-CM | POA: Diagnosis present

## 2021-11-05 LAB — HM MAMMOGRAPHY

## 2021-11-05 NOTE — Therapy (Addendum)
PHYSICAL THERAPY DISCHARGE SUMMARY  Visits from Start of Care: 3  Current functional level related to goals / functional outcomes: See note below   Remaining deficits: See note below   Education / Equipment: HEP  Plan: Patient agrees to discharge.  Patient goals were not met. Patient requested discharge after having 3 no-shows.    Rennie Natter, PT, DPT 9:42 AM 01/23/2022  [facility] [place], [place], [address] Phone: [phone]   Fax:  [phone]  Physical Therapy Treatment  Patient Details  Name: Caitlin [name] MRN: [id number] Date of Birth: 05-02-1946 Referring Provider (PT): Marcial Pacas MD   Encounter Date: 11/05/2021   PT End of Session - 11/05/21 1317     Visit Number 3    Number of Visits 16    Date for PT Re-Evaluation 12/05/21    Authorization Type UHC MCR    Progress Note Due on Visit 10    PT Start Time 8588    PT Stop Time 1359    PT Time Calculation (min) 42 min    Activity Tolerance Patient tolerated treatment well    Behavior During Therapy [facility] for tasks assessed/performed             Past Medical History:  Diagnosis Date   Arthritis    Asthma    Atrial fibrillation ([place])    Cauda equina syndrome ([place]) 12/19/7739   Complication of anesthesia    COPD (chronic obstructive pulmonary disease) ([place])    Depression    Diabetes mellitus without complication ([place])    Type II   Diabetic neuropathy, type II diabetes mellitus ([place]) 12/13/2019   Dysrhythmia    GERD (gastroesophageal reflux disease)    HTN (hypertension)    Hypercholesteremia    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 Small internal hemorrhoids. Otherwise normal colonosopy.    LUMBAR WOUND DEBRIDEMENT N/A 08/09/2019   Procedure: LUMBAR WOUND DEBRIDEMENT Evacuation of  EPIDURAL HEMATOMA;  Surgeon: Newman Pies, MD;  Location: [facility];  Service: Neurosurgery;  Laterality: N/A;    There were no vitals filed for this visit.   Subjective Assessment - 11/05/21 1317     Subjective Pt. reports her optometrist didn't notice her nystagmus, noticed trouble with reading due to blurriness and difficulty keeping place on page.  She reports the VOR exercises still make her dizzy.  Would like to continue to focus on balance.    Pertinent History DM, Afib, lumbar radiculopathy, peripheral neuropathy in her feet mostly at night; lumbar fusion 08/04/19    How long can you sit comfortably? no limitations    How long can you stand comfortably? one hour    How long can you walk comfortably? 5 min    Diagnostic tests MRI:    Patient Stated Goals increase my stamina; improve  my gait and walk normally; see if it will help with my dizziness    Currently in Pain? No/denies    Pain Onset More than a month ago    Pain Onset More than a month ago                               [facility] Adult PT Treatment/Exercise - 11/05/21 0001       Exercises   Exercises Knee/Hip      Knee/Hip Exercises: Aerobic   Nustep L3 x 6 min      Knee/Hip Exercises: Standing   Heel Raises Both;20 reps    Heel Raises Limitations counter support    Hip Abduction Stengthening;Both;20 reps    Abduction Limitations counter support    Hip Extension Stengthening;Both;Knee straight;20 reps    Extension Limitations counter  support    Functional Squat 20 reps    Functional Squat Limitations counter support, chair behind for safety                 Balance Exercises - 11/05/21 0001       Balance Exercises: Standing   Standing Eyes Opened Head turns;Foam/compliant surface;Limitations    Standing Eyes Opened Limitations standing in corner for safety with SBA; eyes open feet together x 30 sec, head turns x 10 with gaze fixed on target, head nods x 10 with gazed fixed on target.  Time between sets to decrease report of mild dizziness.  Repeated on airex with feet apart.    Standing Eyes Closed Head turns;Foam/compliant surface;Limitations    Standing Eyes Closed Limitations standing in corner for safety with SBA; eyes closed feet together x 30 sec, head turns x 10 , head nods x 10   Repeated on airex with feet apart.  Noted significant increase in sway with eyes closed, head movements on airex, tactile cues to decrease sway given.    Tandem Stance 2 reps;30 secs;Upper extremity support 1    SLS 2 reps;Eyes open;Upper extremity support 1;30 secs                PT Education - 11/05/21 1405     Education Details HEP update Access Code: [email or web address]    Person(s) Educated Patient    Methods Explanation;Demonstration;Verbal cues;Handout    Comprehension Verbalized understanding;Returned demonstration              PT Short Term Goals - 10/11/21 1124       PT SHORT TERM GOAL #1   Title Vestibular and balance assemssments completed and goals set.    Time 1    Period Weeks    Status Achieved   10/11/21   Target Date 10/17/21      PT SHORT TERM GOAL #2   Title ind with initial HEP    Time 2    Period Weeks    Status On-going   10/11/21- given VOR x 1 seated exercises.   Target Date 10/24/21               PT Long Term Goals - 11/05/21 1402       PT LONG TERM GOAL #1   Title Ind with advanced HEP    Time 8    Period Weeks    Status On-going    Target Date 12/05/21      PT LONG TERM  GOAL #2   Title Improved 5x sit to stand to 21 sec or  less to decrease fall risk    Baseline 31 sec    Time 8    Period Weeks    Status On-going      PT LONG TERM GOAL #3   Title Patient to demonstrate improved Bil LE strenght to 4+/5 or better to help her perform functional transfers independently.    Time 8    Period Weeks    Status On-going      PT LONG TERM GOAL #4   Title Pt to demonstrate improved cervical extension to [facility] allowing patient to perform [place] ADLS.    Time 8    Period Weeks    Status On-going      PT LONG TERM GOAL #5   Title Patient able to safely ambulate community distances indoor/outdoor and level and unlevel surfaces with least restrictive AD.    Time 8    Period Weeks    Status On-going      PT LONG TERM GOAL #6   Title improved FOTO to 51 from 39 showing functional improvement in her neck.    Time 8    Period Weeks    Status On-going      PT LONG TERM GOAL #7   Title Pt. will demonstrate improved balance on BERG by at least 8 points to decrease risk of falls.    Baseline 23/56    Time 8    Period Weeks    Status On-going    Target Date 12/05/21      PT LONG TERM GOAL #8   Title Pt. will report 50% improvement in dizziness symptoms.    Time 8    Period Weeks    Status On-going    Target Date 12/05/21                   Plan - 11/05/21 1402     Clinical Impression Statement Pt reports no new falls but still having dizziness with VOR exercises.  Today progressed HEP for hip strengthening to address both back and and balance.  Also participated in standing balance exercises in corner, very challenged with head turns while on airex.  Noted that tracking is improving but still has fatigue and more saccades at end of each trial.   Still having mild dizziness with head movements, but improved with gaze fixation.  She would benefit from continued skilled therapy.    Personal Factors and Comorbidities Comorbidity 3+;Time since onset of  injury/illness/exacerbation    Comorbidities DM, afib, HTN, neuropathy, LBP, cervical myelopathy    Examination-Activity Limitations Bend;Carry;Lift;Locomotion Level;Transfers    Examination-Participation Restrictions Community Activity    Stability/Clinical Decision Making Evolving/Moderate complexity    Rehab Potential Good    PT Frequency 2x / week    PT Duration 8 weeks    PT Treatment/Interventions ADLs/Self Care Home Management;Aquatic Therapy;Cryotherapy;Electrical Stimulation;Moist Heat;Traction;Neuromuscular re-education;Balance training;Therapeutic exercise;Therapeutic activities;Functional mobility training;Gait training;Stair training;Patient/family education;Manual techniques;Dry needling;Vestibular;Joint Manipulations;Spinal Manipulations    PT Next Visit Plan VOR exercises, standing balance exercises, general strength and ROM, gait training RW and SPC    PT Home Exercise Plan [email or web address];  [id number]    Consulted and Agree with Plan of Care Patient             Patient will benefit from skilled therapeutic intervention in order to improve the following deficits and impairments:  Abnormal gait, Decreased range of motion, Increased muscle spasms, Pain, Decreased activity tolerance, Decreased balance, Impaired flexibility, Postural dysfunction, Impaired sensation, Decreased strength  Visit Diagnosis: Unsteadiness  on feet  Muscle weakness (generalized)  Cervicalgia  Chronic bilateral low back pain without sciatica     Problem List Patient Active Problem List   Diagnosis Date Noted   Acquired thrombophilia ([place]) 10/06/2021   Cervical myelopathy ([place]) 09/06/2021   Lumbar radiculopathy 09/06/2021   Mixed hyperlipidemia 07/14/2021   Mild recurrent major depression ([place]) 07/14/2021   Aortic atherosclerosis ([place]) 07/14/2021   Arthritis 07/05/2021   Asthma 07/05/2021   Dysrhythmia 07/05/2021   PONV (postoperative nausea and vomiting) 07/05/2021   Gait abnormality  07/02/2021   Neck pain 07/02/2021   Restless leg syndrome 07/02/2021   Bunion, right 10/02/2020   Fissure in skin of both feet 10/02/2020   Onychomycosis due to dermatophyte 10/02/2020   Gastroesophageal reflux disease without esophagitis 06/19/2020   Anxiety 06/19/2020   Hypertension associated with diabetes ([place]) 05/03/2020   Encounter for annual wellness visit (AWV) in Medicare patient 04/06/2020   Class 2 severe obesity due to excess calories with serious comorbidity and body mass index (BMI) of 37.0 to 37.9 in adult ([place]) 12/13/2019   Atrial fibrillation ([place]) 12/13/2019   Type 2 diabetes mellitus with diabetic neuropathy, with long-term current use of insulin ([place]) 38/05/1750   Complication of anesthesia 12/13/2019   Pain in both lower extremities 11/08/2019   Iron deficiency anemia 11/08/2019   Spondylosis without myelopathy or radiculopathy, lumbar region 07/02/2017   Vitamin D deficiency 01/15/2017   Obstructive sleep apnea 03/31/2015    Rennie Natter, PT, DPT  11/05/2021, 2:10 PM  [facility] [address]  [place], [place], [address] Phone: [phone]   Fax:  [phone]  Name: Caitlin [name] MRN: [id number] Date of Birth: [birth year]

## 2021-11-05 NOTE — Patient Instructions (Signed)
Access Code: [email or web address] ?URL: [email or web address] ?Date: 11/05/2021 ?Prepared by: Glenetta Hew ? ?Exercises ?Seated Gaze Stabilization with Head Rotation - 3 x daily - 7 x weekly - 3 sets - 10 reps ?Seated Gaze Stabilization with Head Nod - 3 x daily - 7 x weekly - 3 sets - 10 reps ?Heel Raises with Counter Support - 1 x daily - 7 x weekly - 3 sets - 10 reps ?Heel Toe Raises with Counter Support - 1 x daily - 7 x weekly - 2-3 sets - 10 reps ?Standing Hip Abduction with Counter Support - 1 x daily - 7 x weekly - 2-3 sets - 10 reps ?Standing Hip Extension with Counter Support - 1 x daily - 7 x weekly - 2-3 sets - 10 reps ?Mini Squat with Counter Support - 1 x daily - 7 x weekly - 2-3 sets - 10 reps ?Standing Single Leg Stance with Counter Support - 1 x daily - 7 x weekly - 1 sets - 2 reps - 30 sec - 1 min hold ?Standing Tandem Balance with Counter Support - 1 x daily - 7 x weekly - 1 sets - 2 reps - 30 sec hold ? ?

## 2021-11-06 ENCOUNTER — Encounter: Payer: Self-pay | Admitting: Nurse Practitioner

## 2021-11-06 ENCOUNTER — Ambulatory Visit (INDEPENDENT_AMBULATORY_CARE_PROVIDER_SITE_OTHER): Payer: Medicare Other | Admitting: Nurse Practitioner

## 2021-11-06 VITALS — BP 138/90 | HR 87 | Temp 97.0°F | Ht 62.0 in | Wt 196.0 lb

## 2021-11-06 DIAGNOSIS — I10 Essential (primary) hypertension: Secondary | ICD-10-CM | POA: Diagnosis not present

## 2021-11-06 MED ORDER — PROPRANOLOL HCL ER 80 MG PO CP24: 80.0000 mg | ORAL_CAPSULE | Freq: Every day | ORAL | 0 refills | Status: DC

## 2021-11-06 NOTE — Progress Notes (Addendum)
? ?Acute Office Visit ? ?Subjective:  ? ? Patient ID: Caitlin [name], female    DOB: 07-03-1946, 76 y.o.   MRN: [id number] ? ?Chief Complaint  ?Patient presents with  ? Hypertension  ? ? ?HPI: ?Caitlin [name] is a 76 year old Caucasian female that presents for uncontrolled hypertension. She tells me she has experienced increased stress and anxiety. Reports her spouse was recently diagnosed with renal cancer and she lost her brother last year. States, "I am so impatient. I want things to hurry up like when I listened to the voice mail message at the office and when someone is talking to me. I want them to go ahead and finishing what they are saying." States she has experienced increased irritability and insomnia. States her recent anxiety is affecting her blood pressure.   ? ?Hypertension, follow-up: ?Patient is in today for She was last seen for hypertension 5 days ago.  ?BP at that visit was 156/100. Management since that visit includes increased losartan to 320 mg daily.. ? ?She reports excellent compliance with treatment. ?She is not having side effects.  ?She is following a Low Sodium diet. ?She is not exercising. ?She does not smoke. ? ?Outside blood pressures are 171/151, 151/90 P88,  173/128 P100,  166/95 P92,  184/112 P99,  184/108 P99,  176/108 P90,  208/114 P89 ?Symptoms: ?No chest pain No chest pressure  ?No palpitations No syncope  ?No dyspnea No orthopnea  ?No paroxysmal nocturnal dyspnea No lower extremity edema  ? ?Pertinent labs: ?Lab Results  ?Component Value Date  ? CHOL 258 (H) 10/05/2021  ? HDL 53 10/05/2021  ? LDLCALC 183 (H) 10/05/2021  ? TRIG 125 10/05/2021  ? CHOLHDL 4.9 (H) 10/05/2021  ? Lab Results  ?Component Value Date  ? NA 142 10/05/2021  ? K 5.0 10/05/2021  ? CREATININE 0.72 10/05/2021  ? EGFR 88 10/05/2021  ? GFRNONAA 75 07/06/2020  ? GLUCOSE 91 10/05/2021  ?  ? ?The 10-year ASCVD risk score (Arnett DK, et al., 2019) is: 46.8%  ? ? ?Past Medical History:  ?Diagnosis Date  ? Arthritis   ? Asthma    ? Atrial fibrillation ([place])   ? Cauda equina syndrome ([place]) 12/13/2019  ? Complication of anesthesia   ? COPD (chronic obstructive pulmonary disease) ([place])   ? Depression   ? Diabetes mellitus without complication ([place])   ? Type II  ? Diabetic neuropathy, type II diabetes mellitus ([place]) 12/13/2019  ? Dysrhythmia   ? GERD (gastroesophageal reflux disease)   ? HTN (hypertension)   ? Hypercholesteremia   ? 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[place]) 12/13/2019  ? Morbid obesity ([place]) 12/13/2019  ? Obstructive sleep apnea   ? PONV (postoperative nausea and vomiting)   ? "not the last few times"  ? RLS (restless legs syndrome)   ? S/P ablation of atrial flutter 02/12/2016  ? S/P lumbar fusion 08/04/2019  ? Sleep apnea   ? Upper respiratory tract infection due to COVID-19 virus 08/14/2021  ? ? ?Past Surgical History:  ?Procedure Laterality Date  ? ATRIAL FIBRILLATION ABLATION    ? x3  ? COLONOSCOPY  01/04/2013  ? Moderate predominantly sigmoid diverticulosis. Small internal hemorrhoids. Otherwise normal colonosopy.   ? LUMBAR WOUND DEBRIDEMENT N/A 08/09/2019  ? Procedure: LUMBAR WOUND DEBRIDEMENT Evacuation of  EPIDURAL HEMATOMA;  Surgeon: Newman Pies, MD;  Location: [facility];  Service: Neurosurgery;  Laterality: N/A;  ? ? ?Family History  ?  Problem Relation Age of Onset  ? Breast cancer Mother 70  ? Heart attack Father 8  ? Mental illness Sister   ? Heart Problems Brother 49  ? Cancer Brother   ? Breast cancer Paternal Grandmother   ? Colon cancer Paternal Aunt 85  ? ? ?Social History  ? ?Socioeconomic History  ? Marital status: Married  ?  Spouse name: Not on file  ? Number of children: Not on file  ? Years of education: Not on file  ? Highest education level: Not on file  ?Occupational History  ? Not on file  ?Tobacco Use  ? Smoking status: Never  ?  Passive exposure: Past  ? Smokeless tobacco: Never  ?Vaping Use  ? Vaping  Use: Never used  ?Substance and Sexual Activity  ? Alcohol use: Not Currently  ? Drug use: Never  ? Sexual activity: Not Currently  ?Other Topics Concern  ? Not on file  ?Social History Narrative  ? Not on file  ? ?Social Determinants of Health  ? ?Financial Resource Strain: Not on file  ?Food Insecurity: Not on file  ?Transportation Needs: Not on file  ?Physical Activity: Not on file  ?Stress: Not on file  ?Social Connections: Not on file  ?Intimate Partner Violence: Not on file  ? ? ?Outpatient Medications Prior to Visit  ?Medication Sig Dispense Refill  ? albuterol (PROVENTIL) (2.5 MG/3ML) 0.083% nebulizer solution Take 3 mLs (2.5 mg total) by nebulization every 6 (six) hours as needed for wheezing or shortness of breath. 75 mL 0  ? albuterol (VENTOLIN HFA) 108 (90 Base) MCG/ACT inhaler Inhale 2 puffs into the lungs every 6 (six) hours as needed for wheezing or shortness of breath. 8 g 3  ? BD PEN NEEDLE NANO 2ND GEN 32G X 4 MM MISC 1 EACH BY DOES NOT APPLY ROUTE IN THE MORNING AND AT BEDTIME. 100 each 2  ? Budeson-Glycopyrrol-Formoterol (BREZTRI AEROSPHERE) 160-9-4.8 MCG/ACT AERO Inhale 2 puffs into the lungs in the morning and at bedtime. 10.7 g 3  ? buPROPion (WELLBUTRIN XL) 300 MG 24 hr tablet Take one tablet in morning for one week then increase to two tablets. 90 tablet 1  ? calcium carbonate (TUMS - DOSED IN MG ELEMENTAL CALCIUM) 500 MG chewable tablet Chew 2 tablets by mouth daily as needed for indigestion.     ? Continuous Blood Gluc Receiver (FREESTYLE LIBRE 14 DAY READER) DEVI USE TO CHECK BLOOD SUGARS DAILY    ? Continuous Blood Gluc Sensor (FREESTYLE LIBRE 14 DAY SENSOR) MISC SMARTSIG:1 Topical Every 2 Weeks    ? Ferrous Sulfate (SLOW RELEASE IRON PO) Take by mouth daily with supper.    ? fexofenadine (ALLEGRA) 180 MG tablet Take 180 mg by mouth daily as needed for allergies or rhinitis.    ? Insulin Degludec (TRESIBA FLEXTOUCH) 200 UNIT/ML SOPN Inject 50 Units into the skin every evening.    ?  Insulin Pen Needle 31G X 8 MM MISC Inject 1 Units into the skin daily.    ? ketorolac (ACULAR) 0.5 % ophthalmic solution INSTILL 1 DROP INTO LEFT EYE FOUR TIMES A DAY AS DIRECTED    ? moxifloxacin (VIGAMOX) 0.5 % ophthalmic solution Place 1 drop into the left eye 4 (four) times daily.    ? omeprazole (PRILOSEC) 40 MG capsule TAKE 1 CAPSULE(S) BY MOUTH DAILY IN THE MORNING 90 capsule 3  ? prednisoLONE acetate (PRED FORTE) 1 % ophthalmic suspension Place 1 drop into the left eye 4 (four) times  daily.    ? pregabalin (LYRICA) 100 MG capsule 1 am, one at noon, 2 qhs 360 capsule 4  ? rivaroxaban (XARELTO) 20 MG TABS tablet Take by mouth at bedtime.    ? rosuvastatin (CRESTOR) 40 MG tablet Take 1 tablet (40 mg total) by mouth daily. 90 tablet 0  ? Semaglutide,0.25 or 0.5MG/DOS, 2 MG/1.5ML SOPN Inject 0.5 mg into the skin every Monday.     ? valsartan (DIOVAN) 320 MG tablet Take 1 tablet (320 mg total) by mouth daily. 90 tablet 3  ? VITAMIN D-VITAMIN K PO Take 1 tablet by mouth every evening.    ? ?No facility-administered medications prior to visit.  ? ? ?Allergies  ?Allergen Reactions  ? Codeine Nausea Only  ? ? ?Review of Systems  ?Constitutional:  Negative for chills, fatigue and fever.  ?HENT:  Negative for congestion.   ?Respiratory:  Positive for shortness of breath (with exertion). Negative for cough.   ?Cardiovascular:  Negative for chest pain.  ?Gastrointestinal:  Negative for nausea.  ?Neurological:  Positive for headaches. Negative for dizziness.  ?Psychiatric/Behavioral:  Positive for agitation (irritability), decreased concentration and sleep disturbance (insomnia). The patient is nervous/anxious.   ?All other systems reviewed and are negative. ? ?   ?Objective:  ?  ?Physical Exam ?Vitals reviewed.  ?Constitutional:   ?   Appearance: Normal appearance.  ?Cardiovascular:  ?   Rate and Rhythm: Normal rate. Rhythm irregular.  ?   Pulses: Normal pulses.  ?   Heart sounds: Normal heart sounds.  ?Pulmonary:  ?    Effort: Pulmonary effort is normal.  ?   Breath sounds: Normal breath sounds.  ?Skin: ?   General: Skin is warm and dry.  ?   Capillary Refill: Capillary refill takes less than 2 seconds.  ?Neurological:  ?

## 2021-11-06 NOTE — Patient Instructions (Addendum)
Begin Propranolol 80 mg daily ?Monitor BP and pulse, keep log ?Notify office immediately of any adverse side effects ?Continue Diovan 320 mg daily ?Low salt diet ?Follow-up on 11/15/21 at 11:20 as scheduled, bring BP and pulse log ? ? ?Propranolol Tablets ?What is this medication? ?PROPRANOLOL (proe PRAN oh lole) treats many conditions such as high blood pressure, tremors, and a type of arrhythmia known as AFib (atrial fibrillation). It works by lowering your blood pressure and heart rate, making it easier for your heart to pump blood to the rest of your body. It may be used to prevent migraine headaches. It works by relaxing the blood vessels in the brain that cause migraines. It belongs to a group of medications called beta blockers. ?This medicine may be used for other purposes; ask your health care provider or pharmacist if you have questions. ?COMMON BRAND NAME(S): Inderal ?What should I tell my care team before I take this medication? ?They need to know if you have any of these conditions: ?Circulation problems or blood vessel disease ?Diabetes ?History of heart attack or heart disease, vasospastic angina ?Kidney disease ?Liver disease ?Lung or breathing disease, like asthma or emphysema ?Pheochromocytoma ?Slow heart rate ?Thyroid disease ?An unusual or allergic reaction to propranolol, other beta-blockers, medications, foods, dyes, or preservatives ?Pregnant or trying to get pregnant ?Breast-feeding ?How should I use this medication? ?Take this medication by mouth. Take it as directed on the prescription label at the same time every day. Keep taking it unless your care team tells you to stop. ?Talk to your care team about the use of this medication in children. Special care may be needed. ?Overdosage: If you think you have taken too much of this medicine contact a poison control center or emergency room at once. ?NOTE: This medicine is only for you. Do not share this medicine with others. ?What if I miss a  dose? ?If you miss a dose, take it as soon as you can. If it is almost time for your next dose, take only that dose. Do not take double or extra doses. ?What may interact with this medication? ?Do not take this medication with any of the following: ?Feverfew ?Phenothiazines like chlorpromazine, mesoridazine, prochlorperazine, thioridazine ?This medication may also interact with the following: ?Aluminum hydroxide gel ?Antipyrine ?Antiviral medications for HIV or AIDS ?Barbiturates like phenobarbital ?Certain medications for blood pressure, heart disease, irregular heart beat ?Cimetidine ?Ciprofloxacin ?Diazepam ?Fluconazole ?Haloperidol ?Isoniazid ?Medications for cholesterol like cholestyramine or colestipol ?Medications for mental depression ?Medications for migraine headache like almotriptan, eletriptan, frovatriptan, naratriptan, rizatriptan, sumatriptan, zolmitriptan ?NSAIDs, medications for pain and inflammation, like ibuprofen or naproxen ?Phenytoin ?Rifampin ?Teniposide ?Theophylline ?Thyroid medications ?Tolbutamide ?Warfarin ?Zileuton ?This list may not describe all possible interactions. Give your health care provider a list of all the medicines, herbs, non-prescription drugs, or dietary supplements you use. Also tell them if you smoke, drink alcohol, or use illegal drugs. Some items may interact with your medicine. ?What should I watch for while using this medication? ?Visit your care team for regular checks on your progress. Check your blood pressure as directed. Ask your care team what your blood pressure should be. Also, find out when you should contact him or her. ?Do not treat yourself for coughs, colds, or pain while you are using this medication without asking your care team for advice. Some medications may increase your blood pressure. ?You may get drowsy or dizzy. Do not drive, use machinery, or do anything that needs mental alertness until you know  how this medication affects you. Do not stand  up or sit up quickly, especially if you are an older patient. This reduces the risk of dizzy or fainting spells. Alcohol may interfere with the effect of this medication. Avoid alcoholic drinks. ?This medication may increase blood sugar. Ask your care team if changes in diet or medications are needed if you have diabetes. ?What side effects may I notice from receiving this medication? ?Side effects that you should report to your care team as soon as possible: ?Allergic reactions--skin rash, itching, hives, swelling of the face, lips, tongue, or throat ?Heart failure--shortness of breath, swelling of the ankles, feet, or hands, sudden weight gain, unusual weakness or fatigue ?Low blood pressure--dizziness, feeling faint or lightheaded, blurry vision ?Raynaud's--cool, numb, or painful fingers or toes that may change color from pale, to blue, to red ?Redness, blistering, peeling, or loosening of the skin, including inside the mouth ?Slow heartbeat--dizziness, feeling faint or lightheaded, confusion, trouble breathing, unusual weakness or fatigue ?Worsening mood, feelings of depression ?Side effects that usually do not require medical attention (report to your care team if they continue or are bothersome): ?Change in sex drive or performance ?Diarrhea ?Dizziness ?Fatigue ?Headache ?This list may not describe all possible side effects. Call your doctor for medical advice about side effects. You may report side effects to FDA at 1-800-FDA-1088. ?Where should I keep my medication? ?Keep out of the reach of children and pets. ?Store at room temperature between 20 and 25 degrees C (68 and 77 degrees F). Protect from light. Throw away any unused medication after the expiration date. ?NOTE: This sheet is a summary. It may not cover all possible information. If you have questions about this medicine, talk to your doctor, pharmacist, or health care provider. ?? 2022 [employer] Standard (2021-04-24 00:00:00) ? ? ?Managing Your  Hypertension ?Hypertension, also called high blood pressure, is when the force of the blood pressing against the walls of the arteries is too strong. Arteries are blood vessels that carry blood from your heart throughout your body. Hypertension forces the heart to work harder to pump blood and may cause the arteries to become narrow or stiff. ?Understanding blood pressure readings ?Your personal target blood pressure may vary depending on your medical conditions, your age, and other factors. A blood pressure reading includes a higher number over a lower number. Ideally, your blood pressure should be below 120/80. You should know that: ?The first, or top, number is called the systolic pressure. It is a measure of the pressure in your arteries as your heart beats. ?The second, or bottom number, is called the diastolic pressure. It is a measure of the pressure in your arteries as the heart relaxes. ?Blood pressure is classified into four stages. Based on your blood pressure reading, your health care provider may use the following stages to determine what type of treatment you need, if any. Systolic pressure and diastolic pressure are measured in a unit called mmHg. ?Normal ?Systolic pressure: below 053. ?Diastolic pressure: below 80. ?Elevated ?Systolic pressure: 976-734. ?Diastolic pressure: below 80. ?Hypertension stage 1 ?Systolic pressure: 193-790. ?Diastolic pressure: 24-09. ?Hypertension stage 2 ?Systolic pressure: 735 or above. ?Diastolic pressure: 90 or above. ?How can this condition affect me? ?Managing your hypertension is an important responsibility. Over time, hypertension can damage the arteries and decrease blood flow to important parts of the body, including the brain, heart, and kidneys. Having untreated or uncontrolled hypertension can lead to: ?A heart attack. ?A stroke. ?A weakened blood vessel (  aneurysm). ?Heart failure. ?Kidney damage. ?Eye damage. ?Metabolic syndrome. ?Memory and concentration  problems. ?Vascular dementia. ?What actions can I take to manage this condition? ?Hypertension can be managed by making lifestyle changes and possibly by taking medicines. Your health care provider will he

## 2021-11-08 ENCOUNTER — Ambulatory Visit: Payer: Medicare Other | Admitting: Physical Therapy

## 2021-11-12 ENCOUNTER — Ambulatory Visit: Payer: Medicare Other | Admitting: Physical Therapy

## 2021-11-15 ENCOUNTER — Ambulatory Visit: Payer: Medicare Other | Admitting: Nurse Practitioner

## 2021-11-27 ENCOUNTER — Encounter: Payer: Self-pay | Admitting: Family Medicine

## 2021-11-28 ENCOUNTER — Ambulatory Visit: Payer: Medicare Other

## 2021-11-29 ENCOUNTER — Other Ambulatory Visit: Payer: Self-pay | Admitting: Nurse Practitioner

## 2021-11-29 DIAGNOSIS — I10 Essential (primary) hypertension: Secondary | ICD-10-CM

## 2021-12-04 ENCOUNTER — Encounter: Payer: Medicare Other | Admitting: Physical Therapy

## 2021-12-10 ENCOUNTER — Encounter: Payer: Self-pay | Admitting: Nurse Practitioner

## 2021-12-10 ENCOUNTER — Ambulatory Visit (INDEPENDENT_AMBULATORY_CARE_PROVIDER_SITE_OTHER): Payer: Medicare Other | Admitting: Nurse Practitioner

## 2021-12-10 VITALS — BP 158/82 | HR 69 | Resp 18 | Ht 63.0 in | Wt 194.0 lb

## 2021-12-10 DIAGNOSIS — I10 Essential (primary) hypertension: Secondary | ICD-10-CM | POA: Diagnosis not present

## 2021-12-10 DIAGNOSIS — F411 Generalized anxiety disorder: Secondary | ICD-10-CM | POA: Diagnosis not present

## 2021-12-10 MED ORDER — HYDROXYZINE PAMOATE 25 MG PO CAPS: 25.0000 mg | ORAL_CAPSULE | Freq: Three times a day (TID) | ORAL | 0 refills | Status: DC | PRN

## 2021-12-10 MED ORDER — VALSARTAN 320 MG PO TABS: 320.0000 mg | ORAL_TABLET | Freq: Every day | ORAL | 3 refills | Status: DC

## 2021-12-10 NOTE — Patient Instructions (Addendum)
Begin Atarax 25 mg every 8 hours as needed for anxiety ?Continue Valsartan 320 mg daily ?Continue Propranolol 80 mg  ?Monitor BP, keep log ?Return in 2 weeks for follow-up ? ? ?Managing Your Hypertension ?Hypertension, also called high blood pressure, is when the force of the blood pressing against the walls of the arteries is too strong. Arteries are blood vessels that carry blood from your heart throughout your body. Hypertension forces the heart to work harder to pump blood and may cause the arteries to become narrow or stiff. ?Understanding blood pressure readings ?A blood pressure reading includes a higher number over a lower number: ?The first, or top, number is called the systolic pressure. It is a measure of the pressure in your arteries as your heart beats. ?The second, or bottom number, is called the diastolic pressure. It is a measure of the pressure in your arteries as the heart relaxes. ?For most people, a normal blood pressure is below 120/80. Your personal target blood pressure may vary depending on your medical conditions, your age, and other factors. ?Blood pressure is classified into four stages. Based on your blood pressure reading, your health care provider may use the following stages to determine what type of treatment you need, if any. Systolic pressure and diastolic pressure are measured in a unit called millimeters of mercury (mmHg). ?Normal ?Systolic pressure: below 676. ?Diastolic pressure: below 80. ?Elevated ?Systolic pressure: 195-093. ?Diastolic pressure: below 80. ?Hypertension stage 1 ?Systolic pressure: 267-124. ?Diastolic pressure: 58-09. ?Hypertension stage 2 ?Systolic pressure: 983 or above. ?Diastolic pressure: 90 or above. ?How can this condition affect me? ?Managing your hypertension is very important. Over time, hypertension can damage the arteries and decrease blood flow to parts of the body, including the brain, heart, and kidneys. Having untreated or uncontrolled  hypertension can lead to: ?A heart attack. ?A stroke. ?A weakened blood vessel (aneurysm). ?Heart failure. ?Kidney damage. ?Eye damage. ?Memory and concentration problems. ?Vascular dementia. ?What actions can I take to manage this condition? ?Hypertension can be managed by making lifestyle changes and possibly by taking medicines. Your health care provider will help you make a plan to bring your blood pressure within a normal range. You may be referred for counseling on a healthy diet and physical activity. ?Nutrition ? ?Eat a diet that is high in fiber and potassium, and low in salt (sodium), added sugar, and fat. An example eating plan is called the DASH diet. DASH stands for Dietary Approaches to Stop Hypertension. To eat this way: ?Eat plenty of fresh fruits and vegetables. Try to fill one-half of your plate at each meal with fruits and vegetables. ?Eat whole grains, such as whole-wheat pasta, brown rice, or whole-grain bread. Fill about one-fourth of your plate with whole grains. ?Eat low-fat dairy products. ?Avoid fatty cuts of meat, processed or cured meats, and poultry with skin. Fill about one-fourth of your plate with lean proteins such as fish, chicken without skin, beans, eggs, and tofu. ?Avoid pre-made and processed foods. These tend to be higher in sodium, added sugar, and fat. ?Reduce your daily sodium intake. Many people with hypertension should eat less than 1,500 mg of sodium a day. ?Lifestyle ? ?Work with your health care provider to maintain a healthy body weight or to lose weight. Ask what an ideal weight is for you. ?Get at least 30 minutes of exercise that causes your heart to beat faster (aerobic exercise) most days of the week. Activities may include walking, swimming, or biking. ?Include exercise to  strengthen your muscles (resistance exercise), such as weight lifting, as part of your weekly exercise routine. Try to do these types of exercises for 30 minutes at least 3 days a week. ?Do not  use any products that contain nicotine or tobacco. These products include cigarettes, chewing tobacco, and vaping devices, such as e-cigarettes. If you need help quitting, ask your health care provider. ?Control any long-term (chronic) conditions you have, such as high cholesterol or diabetes. ?Identify your sources of stress and find ways to manage stress. This may include meditation, deep breathing, or making time for fun activities. ?Alcohol use ?Do not drink alcohol if: ?Your health care provider tells you not to drink. ?You are pregnant, may be pregnant, or are planning to become pregnant. ?If you drink alcohol: ?Limit how much you have to: ?0-1 drink a day for women. ?0-2 drinks a day for men. ?Know how much alcohol is in your drink. In the [place] one drink equals one 12 oz bottle of beer (355 mL), one 5 oz glass of wine (148 mL), or one 1? oz glass of hard liquor (44 mL). ?Medicines ?Your health care provider may prescribe medicine if lifestyle changes are not enough to get your blood pressure under control and if: ?Your systolic blood pressure is 130 or higher. ?Your diastolic blood pressure is 80 or higher. ?Take medicines only as told by your health care provider. Follow the directions carefully. Blood pressure medicines must be taken as told by your health care provider. The medicine does not work as well when you skip doses. Skipping doses also puts you at risk for problems. ?Monitoring ?Before you monitor your blood pressure: ?Do not smoke, drink caffeinated beverages, or exercise within 30 minutes before taking a measurement. ?Use the bathroom and empty your bladder (urinate). ?Sit quietly for at least 5 minutes before taking measurements. ?Monitor your blood pressure at home as told by your health care provider. To do this: ?Sit with your back straight and supported. ?Place your feet flat on the floor. Do not cross your legs. ?Support your arm on a flat surface, such as a table. Make sure your upper  arm is at heart level. ?Each time you measure, take two or three readings one minute apart and record the results. ?You may also need to have your blood pressure checked regularly by your health care provider. ?General information ?Talk with your health care provider about your diet, exercise habits, and other lifestyle factors that may be contributing to hypertension. ?Review all the medicines you take with your health care provider because there may be side effects or interactions. ?Keep all follow-up visits. Your health care provider can help you create and adjust your plan for managing your high blood pressure. ?Where to find more information ?National Heart, Lung, and Blood Institute: https://wilson-eaton.com/ ?American Heart Association: www.heart.org ?Contact a health care provider if: ?You think you are having a reaction to medicines you have taken. ?You have repeated (recurrent) headaches. ?You feel dizzy. ?You have swelling in your ankles. ?You have trouble with your vision. ?Get help right away if: ?You develop a severe headache or confusion. ?You have unusual weakness or numbness, or you feel faint. ?You have severe pain in your chest or abdomen. ?You vomit repeatedly. ?You have trouble breathing. ?These symptoms may be an emergency. Get help right away. Call 911. ?Do not wait to see if the symptoms will go away. ?Do not drive yourself to the hospital. ?Summary ?Hypertension is when the force of blood pumping  through your arteries is too strong. If this condition is not controlled, it may put you at risk for serious complications. ?Your personal target blood pressure may vary depending on your medical conditions, your age, and other factors. For most people, a normal blood pressure is less than 120/80. ?Hypertension is managed by lifestyle changes, medicines, or both. ?Lifestyle changes to help manage hypertension include losing weight, eating a healthy, low-sodium diet, exercising more, stopping smoking, and  limiting alcohol. ?This information is not intended to replace advice given to you by your health care provider. Make sure you discuss any questions you have with your health care provider. ?Document Revised: 09/

## 2021-12-10 NOTE — Progress Notes (Addendum)
? ?Subjective:  ?Patient ID: Caitlin [name], female    DOB: 1946/04/25  Age: 76 y.o. MRN: [id number] ? ?Chief Complaint  ?Patient presents with  ? Hypertension  ? ? ?HPI ?Caitlin [name] is a 76 year old Caucasian female that presents for elevated BP. She recently had BP medications adjusted. She is prescribed Valsartan 320 mg and Propranolol 80 mg. She tells me that she did not have any Valsartan the past 3 days. States she took "leftover" Lisinopril 40 mg. She tells me her BP was elevated today 170s/105 today. She denies chest pain, nausea, sweating, or diarrhea. [name] tells me she has endured increased stress recently. States her spouse was diagnosed with a pituitary mass a few days ago. He was recently diagnosed with renal cancer.  ? ?Hypertension, follow-up: ?She was last seen for hypertension 4 weeks ago.  ?BP at that visit was 138/90. Management since that visit includes Valsartan 320 mg and Propranolol 80 mg  ? ?She reports good compliance with treatment. ?She is not having side effects. ?She is following a Regular diet. ?She is not exercising. ?She does not smoke. ? ?Use of agents associated with hypertension: none.  ? ?Outside blood pressures are 170s/90s ?Symptoms: ?No chest pain No chest pressure  ?No palpitations No syncope  ?No dyspnea No orthopnea  ?No paroxysmal nocturnal dyspnea No lower extremity edema  ? ?Pertinent labs: ?Lab Results  ?Component Value Date  ? CHOL 258 (H) 10/05/2021  ? HDL 53 10/05/2021  ? LDLCALC 183 (H) 10/05/2021  ? TRIG 125 10/05/2021  ? CHOLHDL 4.9 (H) 10/05/2021  ? Lab Results  ?Component Value Date  ? NA 142 10/05/2021  ? K 5.0 10/05/2021  ? CREATININE 0.72 10/05/2021  ? EGFR 88 10/05/2021  ? GFRNONAA 75 07/06/2020  ? GLUCOSE 91 10/05/2021  ?  ? ?The 10-year ASCVD risk score (Arnett DK, et al., 2019) is: 38.9%  ? ?  ?Current Outpatient Medications on File Prior to Visit  ?Medication Sig Dispense Refill  ? albuterol (PROVENTIL) (2.5 MG/3ML) 0.083% nebulizer solution Take 3 mLs (2.5 mg  total) by nebulization every 6 (six) hours as needed for wheezing or shortness of breath. 75 mL 0  ? albuterol (VENTOLIN HFA) 108 (90 Base) MCG/ACT inhaler Inhale 2 puffs into the lungs every 6 (six) hours as needed for wheezing or shortness of breath. 8 g 3  ? BD PEN NEEDLE NANO 2ND GEN 32G X 4 MM MISC 1 EACH BY DOES NOT APPLY ROUTE IN THE MORNING AND AT BEDTIME. 100 each 2  ? Budeson-Glycopyrrol-Formoterol (BREZTRI AEROSPHERE) 160-9-4.8 MCG/ACT AERO Inhale 2 puffs into the lungs in the morning and at bedtime. 10.7 g 3  ? buPROPion (WELLBUTRIN XL) 300 MG 24 hr tablet Take one tablet in morning for one week then increase to two tablets. 90 tablet 1  ? calcium carbonate (TUMS - DOSED IN MG ELEMENTAL CALCIUM) 500 MG chewable tablet Chew 2 tablets by mouth daily as needed for indigestion.     ? Continuous Blood Gluc Receiver (FREESTYLE LIBRE 14 DAY READER) DEVI USE TO CHECK BLOOD SUGARS DAILY    ? Continuous Blood Gluc Sensor (FREESTYLE LIBRE 14 DAY SENSOR) MISC SMARTSIG:1 Topical Every 2 Weeks    ? Ferrous Sulfate (SLOW RELEASE IRON PO) Take by mouth daily with supper.    ? fexofenadine (ALLEGRA) 180 MG tablet Take 180 mg by mouth daily as needed for allergies or rhinitis.    ? Insulin Degludec (TRESIBA FLEXTOUCH) 200 UNIT/ML SOPN Inject 50 Units into the skin every  evening.    ? Insulin Pen Needle 31G X 8 MM MISC Inject 1 Units into the skin daily.    ? ketorolac (ACULAR) 0.5 % ophthalmic solution INSTILL 1 DROP INTO LEFT EYE FOUR TIMES A DAY AS DIRECTED    ? moxifloxacin (VIGAMOX) 0.5 % ophthalmic solution Place 1 drop into the left eye 4 (four) times daily.    ? omeprazole (PRILOSEC) 40 MG capsule TAKE 1 CAPSULE(S) BY MOUTH DAILY IN THE MORNING 90 capsule 3  ? prednisoLONE acetate (PRED FORTE) 1 % ophthalmic suspension Place 1 drop into the left eye 4 (four) times daily.    ? pregabalin (LYRICA) 100 MG capsule 1 am, one at noon, 2 qhs 360 capsule 4  ? propranolol ER (INDERAL LA) 80 MG 24 hr capsule TAKE 1 CAPSULE  BY MOUTH EVERY DAY 30 capsule 0  ? rivaroxaban (XARELTO) 20 MG TABS tablet Take by mouth at bedtime.    ? rosuvastatin (CRESTOR) 40 MG tablet Take 1 tablet (40 mg total) by mouth daily. 90 tablet 0  ? Semaglutide,0.25 or 0.5MG /DOS, 2 MG/1.5ML SOPN Inject 0.5 mg into the skin every Monday.     ? valsartan (DIOVAN) 320 MG tablet Take 1 tablet (320 mg total) by mouth daily. 90 tablet 3  ? VITAMIN D-VITAMIN K PO Take 1 tablet by mouth every evening.    ? ?No current facility-administered medications on file prior to visit.  ? ?Past Medical History:  ?Diagnosis Date  ? Arthritis   ? Asthma   ? Atrial fibrillation ([place])   ? Cauda equina syndrome ([place]) 12/13/2019  ? Complication of anesthesia   ? COPD (chronic obstructive pulmonary disease) ([place])   ? Depression   ? Diabetes mellitus without complication ([place])   ? Type II  ? Diabetic neuropathy, type II diabetes mellitus ([place]) 12/13/2019  ? Dysrhythmia   ? GERD (gastroesophageal reflux disease)   ? HTN (hypertension)   ? Hypercholesteremia   ? 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 Morbid obesity ([place]) 12/13/2019  ? Obstructive sleep apnea   ? PONV (postoperative nausea and vomiting)   ? "not the last few times"  ? RLS (restless legs syndrome)   ? S/P ablation of atrial flutter 02/12/2016  ? S/P lumbar fusion 08/04/2019  ? Sleep apnea   ? Upper respiratory tract infection due to COVID-19 virus 08/14/2021  ? ?Past Surgical History:  ?Procedure Laterality Date  ? ATRIAL FIBRILLATION ABLATION    ? x3  ? COLONOSCOPY  01/04/2013  ? Moderate predominantly sigmoid diverticulosis. Small internal hemorrhoids. Otherwise normal colonosopy.   ? LUMBAR WOUND DEBRIDEMENT N/A 08/09/2019  ? Procedure: LUMBAR WOUND DEBRIDEMENT Evacuation of  EPIDURAL HEMATOMA;  Surgeon: Newman Pies, MD;  Location: [facility];  Service: Neurosurgery;  Laterality: N/A;  ?  ?Family History  ?Problem  Relation Age of Onset  ? Breast cancer Mother 9  ? Heart attack Father 68  ? Mental illness Sister   ? Heart Problems Brother 40  ? Cancer Brother   ? Breast cancer Paternal Grandmother   ? Colon cancer Paternal Aunt 68  ? ?Social History  ? ?Socioeconomic History  ? Marital status: Married  ?  Spouse name: Not on file  ? Number of children: Not on file  ? Years of education: Not on file  ? Highest education level: Not on file  ?Occupational History  ? Not on file  ?Tobacco  Use  ? Smoking status: Never  ?  Passive exposure: Past  ? Smokeless tobacco: Never  ?Vaping Use  ? Vaping Use: Never used  ?Substance and Sexual Activity  ? Alcohol use: Not Currently  ? Drug use: Never  ? Sexual activity: Not Currently  ?Other Topics Concern  ? Not on file  ?Social History Narrative  ? Not on file  ? ?Social Determinants of Health  ? ?Financial Resource Strain: Not on file  ?Food Insecurity: Not on file  ?Transportation Needs: Not on file  ?Physical Activity: Not on file  ?Stress: Not on file  ?Social Connections: Not on file  ? ? ?Review of Systems  ?Psychiatric/Behavioral:  The patient is nervous/anxious.   ? ? ?Objective:  ?BP (!) 158/82   Pulse 69   Resp 18   Ht $R'5\' 3"'Cy$  (1.6 m)   Wt 194 lb (88 kg)   SpO2 95%   BMI 34.37 kg/m?   ? ? ?  11/06/2021  ? 10:36 AM 11/01/2021  ?  1:32 PM 10/05/2021  ?  8:28 AM  ?BP/Weight  ?Systolic BP 614 431 540  ?Diastolic BP 90 086 761  ?Wt. (Lbs) 196 197 201  ?BMI 35.85 kg/m2 34.9 kg/m2 37.98 kg/m2  ? ? ?Physical Exam ?Vitals reviewed.  ?Constitutional:   ?   Appearance: Normal appearance.  ?Eyes:  ?   Pupils: Pupils are equal, round, and reactive to light.  ?Cardiovascular:  ?   Rate and Rhythm: Normal rate and regular rhythm.  ?   Pulses: Normal pulses.  ?   Heart sounds: Normal heart sounds.  ?Pulmonary:  ?   Effort: Pulmonary effort is normal.  ?   Breath sounds: Normal breath sounds.  ?Skin: ?   General: Skin is warm and dry.  ?   Capillary Refill: Capillary refill takes less than 2  seconds.  ?Neurological:  ?   General: No focal deficit present.  ?   Mental Status: She is alert and oriented to person, place, and time.  ?Psychiatric:     ?   Mood and Affect: Mood normal.     ?   Behavior: Be

## 2021-12-11 ENCOUNTER — Telehealth: Payer: Self-pay

## 2021-12-11 NOTE — Telephone Encounter (Signed)
Prior Auth for Hydroxyine was approved.  ?

## 2021-12-24 ENCOUNTER — Ambulatory Visit: Payer: Medicare Other | Admitting: Nurse Practitioner

## 2021-12-25 ENCOUNTER — Other Ambulatory Visit: Payer: Self-pay | Admitting: Nurse Practitioner

## 2021-12-25 DIAGNOSIS — I10 Essential (primary) hypertension: Secondary | ICD-10-CM

## 2022-01-02 ENCOUNTER — Other Ambulatory Visit: Payer: Self-pay | Admitting: Nurse Practitioner

## 2022-01-02 ENCOUNTER — Ambulatory Visit: Payer: Medicare Other | Admitting: Cardiology

## 2022-01-02 DIAGNOSIS — F411 Generalized anxiety disorder: Secondary | ICD-10-CM

## 2022-01-21 ENCOUNTER — Other Ambulatory Visit: Payer: Self-pay | Admitting: Nurse Practitioner

## 2022-01-21 DIAGNOSIS — I10 Essential (primary) hypertension: Secondary | ICD-10-CM

## 2022-02-13 ENCOUNTER — Other Ambulatory Visit: Payer: Self-pay | Admitting: Nurse Practitioner

## 2022-02-13 DIAGNOSIS — I10 Essential (primary) hypertension: Secondary | ICD-10-CM

## 2022-02-20 ENCOUNTER — Other Ambulatory Visit: Payer: Self-pay | Admitting: Family Medicine

## 2022-02-25 ENCOUNTER — Encounter: Payer: Self-pay | Admitting: Pharmacist

## 2022-02-25 DIAGNOSIS — Z79899 Other long term (current) drug therapy: Secondary | ICD-10-CM

## 2022-02-25 NOTE — Progress Notes (Signed)
[facility])                                            [place] Team                                        Statin Quality Measure Assessment    02/25/2022  Camika Marsico 1946-05-09 [id number]   Per review of chart and payor information, patient has a diagnosis of diabetes but is not currently filling a statin prescription.  This places patient into the SUPD (Statin Use In Patients with Diabetes) measure for CMS.    Rosuvastatin  40 mg is on the medication list but has not been filled this year.  Patient has an upcoming follow up PCP appointment 02/26/22.  If deemed therapeutically appropriate, statin therapy could be assessed during that visit.  The 10-year ASCVD risk score (Arnett DK, et al., 2019) is: 48.6%   Values used to calculate the score:     Age: 76 years     Sex: Female     Is Non-Hispanic African American: No     Diabetic: Yes     Tobacco smoker: No     Systolic Blood Pressure: 128 mmHg     Is BP treated: Yes     HDL Cholesterol: 53 mg/dL     Total Cholesterol: 258 mg/dL 10/05/2021     Component Value Date/Time   CHOL 258 (H) 10/05/2021 0927   TRIG 125 10/05/2021 0927   HDL 53 10/05/2021 0927   CHOLHDL 4.9 (H) 10/05/2021 0927   LDLCALC 183 (H) 10/05/2021 0927    Please consider ONE of the following recommendations:  Initiate high intensity statin Atorvastatin '40mg'$  once daily, #90, 3 refills   Rosuvastatin '20mg'$  once daily, #90, 3 refills    Initiate moderate intensity          statin with reduced frequency if prior          statin intolerance 1x weekly, #13, 3 refills   2x weekly, #26, 3 refills   3x weekly, #39, 3 refills    Code for past statin intolerance or  other exclusions (required annually)   Provider Requirements:  Associate code during an office visit or telehealth encounter  Drug Induced Myopathy G72.0   Myopathy, unspecified G72.9   Myositis, unspecified M60.9    Rhabdomyolysis N86.76   Alcoholic fatty liver H20.9   Cirrhosis of liver K74.69   Prediabetes R73.03   PCOS E28.2   Toxic liver disease, unspecified K71.9         Plan: Route note to PCP  Elayne Guerin, PharmD, [facility] Clinical Pharmacist [phone]

## 2022-02-26 ENCOUNTER — Ambulatory Visit (INDEPENDENT_AMBULATORY_CARE_PROVIDER_SITE_OTHER): Payer: Medicare Other | Admitting: Family Medicine

## 2022-02-26 VITALS — BP 144/88 | HR 84 | Temp 96.8°F | Resp 16 | Ht 63.0 in | Wt 197.6 lb

## 2022-02-26 DIAGNOSIS — S34109A Unspecified injury to unspecified level of lumbar spinal cord, initial encounter: Secondary | ICD-10-CM | POA: Diagnosis not present

## 2022-02-26 DIAGNOSIS — S32008A Other fracture of unspecified lumbar vertebra, initial encounter for closed fracture: Secondary | ICD-10-CM

## 2022-02-26 DIAGNOSIS — M545 Low back pain, unspecified: Secondary | ICD-10-CM | POA: Diagnosis not present

## 2022-02-26 NOTE — Progress Notes (Signed)
Subjective:  Patient ID: Caitlin [name], female    DOB: 1946/06/17  Age: 76 y.o. MRN: [id number]  Chief Complaint  Patient presents with   Hospitalization Follow-up    HPI Mrs. Hellmer comes in hospital follow-up.  She was visiting her husband in the hospital on June 30 and experienced a fall. LOC for 8 minutes. Age indeterminant L3 and L5 fracture.    Current Outpatient Medications on File Prior to Visit  Medication Sig Dispense Refill   omeprazole (PRILOSEC) 40 MG capsule TAKE 1 CAPSULE(S) BY MOUTH DAILY IN THE MORNING 90 capsule 3   oxybutynin (DITROPAN-XL) 10 MG 24 hr tablet Take 10 mg by mouth daily.     albuterol (PROVENTIL) (2.5 MG/3ML) 0.083% nebulizer solution Take 3 mLs (2.5 mg total) by nebulization every 6 (six) hours as needed for wheezing or shortness of breath. 75 mL 0   albuterol (VENTOLIN HFA) 108 (90 Base) MCG/ACT inhaler Inhale 2 puffs into the lungs every 6 (six) hours as needed for wheezing or shortness of breath. 8 g 3   BD PEN NEEDLE NANO 2ND GEN 32G X 4 MM MISC 1 EACH BY DOES NOT APPLY ROUTE IN THE MORNING AND AT BEDTIME. 100 each 2   Budeson-Glycopyrrol-Formoterol (BREZTRI AEROSPHERE) 160-9-4.8 MCG/ACT AERO Inhale 2 puffs into the lungs in the morning and at bedtime. 10.7 g 3   buPROPion (WELLBUTRIN XL) 300 MG 24 hr tablet TAKE ONE TABLET IN MORNING FOR ONE WEEK THEN INCREASE TO TWO TABLETS. 90 tablet 1   calcium carbonate (TUMS - DOSED IN MG ELEMENTAL CALCIUM) 500 MG chewable tablet Chew 2 tablets by mouth daily as needed for indigestion.      Continuous Blood Gluc Receiver (FREESTYLE LIBRE 14 DAY READER) DEVI USE TO CHECK BLOOD SUGARS DAILY     Continuous Blood Gluc Sensor (FREESTYLE LIBRE 14 DAY SENSOR) MISC SMARTSIG:1 Topical Every 2 Weeks     fexofenadine (ALLEGRA) 180 MG tablet Take 180 mg by mouth daily as needed for allergies or rhinitis.     hydrOXYzine (VISTARIL) 25 MG capsule TAKE 1 CAPSULE (25 MG TOTAL) BY MOUTH EVERY 8 (EIGHT) HOURS AS NEEDED FOR ANXIETY.  270 capsule 1   Insulin Degludec (TRESIBA FLEXTOUCH) 200 UNIT/ML SOPN Inject 50 Units into the skin every evening.     Insulin Pen Needle 31G X 8 MM MISC Inject 1 Units into the skin daily.     pregabalin (LYRICA) 100 MG capsule 1 am, one at noon, 2 qhs 360 capsule 4   rivaroxaban (XARELTO) 20 MG TABS tablet Take by mouth at bedtime.     rosuvastatin (CRESTOR) 40 MG tablet Take 1 tablet (40 mg total) by mouth daily. 90 tablet 0   Semaglutide,0.25 or 0.'5MG'$ /DOS, 2 MG/1.5ML SOPN Inject 0.5 mg into the skin every Monday.      valsartan (DIOVAN) 320 MG tablet Take 1 tablet (320 mg total) by mouth daily. 90 tablet 3   VITAMIN D-VITAMIN K PO Take 1 tablet by mouth every evening.     No current facility-administered medications on file prior to visit.   Past Medical History:  Diagnosis Date   Arthritis    Asthma    Atrial fibrillation ([place])    Cauda equina syndrome ([place]) 02/06/3085   Complication of anesthesia    COPD (chronic obstructive pulmonary disease) ([place])    Depression    Diabetes mellitus without complication ([place])    Type II   Diabetic neuropathy, type II diabetes mellitus ([place]) 12/13/2019   Dysrhythmia  GERD (gastroesophageal reflux disease)    HTN (hypertension)    Hypercholesteremia    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[place]) 12/13/2019   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[place]) 12/13/2019   Morbid obesity ([place]) 12/13/2019   Obstructive sleep apnea    PONV (postoperative nausea and vomiting)    "not the last few times"   RLS (restless legs syndrome)    S/P ablation of atrial flutter 02/12/2016   S/P lumbar fusion 08/04/2019   Sleep apnea    Upper respiratory tract infection due to COVID-19 virus 08/14/2021   Past Surgical History:  Procedure Laterality Date   ATRIAL FIBRILLATION ABLATION     x3   CATARACT EXTRACTION Bilateral    COLONOSCOPY  01/04/2013   Moderate predominantly sigmoid diverticulosis. Small internal  hemorrhoids. Otherwise normal colonosopy.    LUMBAR WOUND DEBRIDEMENT N/A 08/09/2019   Procedure: LUMBAR WOUND DEBRIDEMENT Evacuation of  EPIDURAL HEMATOMA;  Surgeon: Newman Pies, MD;  Location: [facility];  Service: Neurosurgery;  Laterality: N/A;    Family History  Problem Relation Age of Onset   Breast cancer Mother 61   Heart attack Father 67   Mental illness Sister    Heart Problems Brother 7   Cancer Brother    Breast cancer Paternal Grandmother    Colon cancer Paternal Aunt 53   Social History   Socioeconomic History   Marital status: Married    Spouse name: Shanon Brow   Number of children: Not on file   Years of education: Not on file   Highest education level: Not on file  Occupational History   Occupation: Retired Engineer, building services  Tobacco Use   Smoking status: Never    Passive exposure: Past   Smokeless tobacco: Never  Vaping Use   Vaping Use: Never used  Substance and Sexual Activity   Alcohol use: Not Currently   Drug use: Never   Sexual activity: Not Currently  Other Topics Concern   Not on file  Social History Narrative   Not on file   Social Determinants of Health   Financial Resource Strain: Low Risk  (04/04/2022)   Overall Financial Resource Strain (CARDIA)    Difficulty of Paying Living Expenses: Not hard at all  Food Insecurity: No Food Insecurity (04/04/2022)   Hunger Vital Sign    Worried About Running Out of Food in the Last Year: Never true    Ran Out of Food in the Last Year: Never true  Transportation Needs: No Transportation Needs (04/04/2022)   PRAPARE - Hydrologist (Medical): No    Lack of Transportation (Non-Medical): No  Physical Activity: Inactive (04/04/2022)   Exercise Vital Sign    Days of Exercise per Week: 0 days    Minutes of Exercise per Session: 0 min  Stress: No Stress Concern Present (04/04/2022)   [place]    Feeling of Stress :  Not at all  Social Connections: Not on file    Review of Systems  Constitutional:  Negative for chills, fatigue and fever.  HENT:  Negative for congestion, rhinorrhea and sore throat.   Respiratory:  Negative for cough and shortness of breath.   Cardiovascular:  Negative for chest pain (right ribcage pain).  Gastrointestinal:  Negative for abdominal pain, constipation, diarrhea, nausea and vomiting.  Genitourinary:  Negative for dysuria and urgency.  Musculoskeletal:  Positive for back pain. Negative for myalgias.  Neurological:  Positive for  weakness, light-headedness and headaches. Negative for dizziness.  Psychiatric/Behavioral:  Negative for dysphoric mood. The patient is not nervous/anxious.      Objective:  BP (!) 144/88   Pulse 84   Temp (!) 96.8 F (36 C)   Resp 16   Ht '5\' 3"'$  (1.6 m)   Wt 197 lb 9.6 oz (89.6 kg)   BMI 35.00 kg/m      04/04/2022   11:18 AM 04/04/2022   11:16 AM 03/15/2022   10:38 AM  BP/Weight  Systolic BP 329 924 268  Diastolic BP 76 80 84  Wt. (Lbs)  193 195.6  BMI  35.3 kg/m2 35.78 kg/m2    Physical Exam Vitals reviewed.  Constitutional:      Appearance: Normal appearance. She is normal weight.  Neck:     Vascular: No carotid bruit.  Cardiovascular:     Rate and Rhythm: Normal rate and regular rhythm.     Heart sounds: Normal heart sounds.  Pulmonary:     Effort: Pulmonary effort is normal. No respiratory distress.     Breath sounds: Normal breath sounds.  Abdominal:     General: Abdomen is flat. Bowel sounds are normal.     Palpations: Abdomen is soft.     Tenderness: There is no abdominal tenderness.  Musculoskeletal:        General: Tenderness (lumbar tenderness) present.  Neurological:     Mental Status: She is alert and oriented to person, place, and time.  Psychiatric:        Mood and Affect: Mood normal.        Behavior: Behavior normal.     Diabetic Foot Exam - Simple   No data filed      Lab Results  Component  Value Date   WBC 10.8 10/05/2021   HGB 13.5 10/05/2021   HCT 41.9 10/05/2021   PLT 288 10/05/2021   GLUCOSE 91 10/05/2021   CHOL 258 (H) 10/05/2021   TRIG 125 10/05/2021   HDL 53 10/05/2021   LDLCALC 183 (H) 10/05/2021   ALT 14 10/05/2021   AST 16 10/05/2021   NA 142 10/05/2021   K 5.0 10/05/2021   CL 102 10/05/2021   CREATININE 0.72 10/05/2021   BUN 15 10/05/2021   CO2 27 10/05/2021   TSH 1.380 07/03/2021   INR 0.9 08/04/2019   HGBA1C 7.3 (H) 10/05/2021      Assessment & Plan:   Problem List Items Addressed This Visit       Nervous and Auditory   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  No orders of the defined types were placed in this encounter.   No orders of the defined types were placed in this encounter.    Follow-up: Return in about 3 months (around 05/29/2022) for chronic fasting, awv this summer with Shelle Iron, LPN.  An After Visit Summary was printed and given to the patient.  Rochel Brome, MD [facility] [phone]

## 2022-02-27 ENCOUNTER — Telehealth: Payer: Self-pay

## 2022-02-27 NOTE — Telephone Encounter (Signed)
Caitlin [name] was instructed to check with [facility] about follow-up with [facility] since they have taken care of her in the past.  If there is a conflict with insurance she is going to let us know before we schedule.

## 2022-03-07 ENCOUNTER — Ambulatory Visit: Payer: Medicare Other | Admitting: Adult Health

## 2022-03-07 ENCOUNTER — Encounter: Payer: Self-pay | Admitting: Adult Health

## 2022-03-10 ENCOUNTER — Other Ambulatory Visit: Payer: Self-pay | Admitting: Nurse Practitioner

## 2022-03-10 DIAGNOSIS — I10 Essential (primary) hypertension: Secondary | ICD-10-CM

## 2022-03-15 ENCOUNTER — Ambulatory Visit (INDEPENDENT_AMBULATORY_CARE_PROVIDER_SITE_OTHER): Payer: Medicare Other | Admitting: Nurse Practitioner

## 2022-03-15 VITALS — BP 122/84 | HR 75 | Resp 18 | Ht 62.0 in | Wt 195.6 lb

## 2022-03-15 DIAGNOSIS — N644 Mastodynia: Secondary | ICD-10-CM | POA: Diagnosis not present

## 2022-03-15 DIAGNOSIS — W19XXXS Unspecified fall, sequela: Secondary | ICD-10-CM

## 2022-03-15 NOTE — Progress Notes (Unsigned)
Acute Office Visit  Subjective:    Patient ID: Caitlin [name], female    DOB: 1946-05-05, 76 y.o.   MRN: [id number]  No chief complaint on file.   HPI: Patient is in today for pain in breast since fall.   Past Medical History:  Diagnosis Date   Arthritis    Asthma    Atrial fibrillation (HCC)    Cauda equina syndrome ([place]) 6/81/1572   Complication of anesthesia    COPD (chronic obstructive pulmonary disease) (HCC)    Depression    Diabetes mellitus without complication (HCC)    Type II   Diabetic neuropathy, type II diabetes mellitus ([place]) 12/13/2019   Dysrhythmia    GERD (gastroesophageal reflux disease)    HTN (hypertension)    Hypercholesteremia    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[place]) 12/13/2019   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[place]) 12/13/2019   Morbid obesity ([place]) 12/13/2019   Obstructive sleep apnea    PONV (postoperative nausea and vomiting)    "not the last few times"   RLS (restless legs syndrome)    S/P ablation of atrial flutter 02/12/2016   S/P lumbar fusion 08/04/2019   Sleep apnea    Upper respiratory tract infection due to COVID-19 virus 08/14/2021    Past Surgical History:  Procedure Laterality Date   ATRIAL FIBRILLATION ABLATION     x3   COLONOSCOPY  01/04/2013   Moderate predominantly sigmoid diverticulosis. Small internal hemorrhoids. Otherwise normal colonosopy.    LUMBAR WOUND DEBRIDEMENT N/A 08/09/2019   Procedure: LUMBAR WOUND DEBRIDEMENT Evacuation of  EPIDURAL HEMATOMA;  Surgeon: Newman Pies, MD;  Location: [facility];  Service: Neurosurgery;  Laterality: N/A;    Family History  Problem Relation Age of Onset   Breast cancer Mother 33   Heart attack Father 34   Mental illness Sister    Heart Problems Brother 29   Cancer Brother    Breast cancer Paternal Grandmother    Colon cancer Paternal Aunt 68    Social History   Socioeconomic History   Marital status: Married    Spouse name:  Not on file   Number of children: Not on file   Years of education: Not on file   Highest education level: Not on file  Occupational History   Not on file  Tobacco Use   Smoking status: Never    Passive exposure: Past   Smokeless tobacco: Never  Vaping Use   Vaping Use: Never used  Substance and Sexual Activity   Alcohol use: Not Currently   Drug use: Never   Sexual activity: Not Currently  Other Topics Concern   Not on file  Social History Narrative   Not on file   Social Determinants of Health   Financial Resource Strain: Not on file  Food Insecurity: Not on file  Transportation Needs: Not on file  Physical Activity: Not on file  Stress: Not on file  Social Connections: Not on file  Intimate Partner Violence: Not on file    Outpatient Medications Prior to Visit  Medication Sig Dispense Refill   albuterol (PROVENTIL) (2.5 MG/3ML) 0.083% nebulizer solution Take 3 mLs (2.5 mg total) by nebulization every 6 (six) hours as needed for wheezing or shortness of breath. 75 mL 0   albuterol (VENTOLIN HFA) 108 (90 Base) MCG/ACT inhaler Inhale 2 puffs into the lungs every 6 (six) hours as needed for wheezing or shortness of breath. 8 g 3   BD PEN NEEDLE NANO  2ND GEN 32G X 4 MM MISC 1 EACH BY DOES NOT APPLY ROUTE IN THE MORNING AND AT BEDTIME. 100 each 2   Budeson-Glycopyrrol-Formoterol (BREZTRI AEROSPHERE) 160-9-4.8 MCG/ACT AERO Inhale 2 puffs into the lungs in the morning and at bedtime. 10.7 g 3   buPROPion (WELLBUTRIN XL) 300 MG 24 hr tablet TAKE ONE TABLET IN MORNING FOR ONE WEEK THEN INCREASE TO TWO TABLETS. 90 tablet 1   calcium carbonate (TUMS - DOSED IN MG ELEMENTAL CALCIUM) 500 MG chewable tablet Chew 2 tablets by mouth daily as needed for indigestion.      Continuous Blood Gluc Receiver (FREESTYLE LIBRE 14 DAY READER) DEVI USE TO CHECK BLOOD SUGARS DAILY     Continuous Blood Gluc Sensor (FREESTYLE LIBRE 14 DAY SENSOR) MISC SMARTSIG:1 Topical Every 2 Weeks     fexofenadine  (ALLEGRA) 180 MG tablet Take 180 mg by mouth daily as needed for allergies or rhinitis.     hydrOXYzine (VISTARIL) 25 MG capsule TAKE 1 CAPSULE (25 MG TOTAL) BY MOUTH EVERY 8 (EIGHT) HOURS AS NEEDED FOR ANXIETY. 270 capsule 1   Insulin Degludec (TRESIBA FLEXTOUCH) 200 UNIT/ML SOPN Inject 50 Units into the skin every evening.     Insulin Pen Needle 31G X 8 MM MISC Inject 1 Units into the skin daily.     omeprazole (PRILOSEC) 40 MG capsule TAKE 1 CAPSULE(S) BY MOUTH DAILY IN THE MORNING 90 capsule 3   oxybutynin (DITROPAN-XL) 10 MG 24 hr tablet Take 10 mg by mouth daily.     pregabalin (LYRICA) 100 MG capsule 1 am, one at noon, 2 qhs 360 capsule 4   propranolol ER (INDERAL LA) 80 MG 24 hr capsule TAKE 1 CAPSULE BY MOUTH EVERY DAY 30 capsule 0   rivaroxaban (XARELTO) 20 MG TABS tablet Take by mouth at bedtime.     rosuvastatin (CRESTOR) 40 MG tablet Take 1 tablet (40 mg total) by mouth daily. 90 tablet 0   Semaglutide,0.25 or 0.5MG /DOS, 2 MG/1.5ML SOPN Inject 0.5 mg into the skin every Monday.      valsartan (DIOVAN) 320 MG tablet Take 1 tablet (320 mg total) by mouth daily. 90 tablet 3   VITAMIN D-VITAMIN K PO Take 1 tablet by mouth every evening.     No facility-administered medications prior to visit.    Allergies  Allergen Reactions   Codeine Nausea Only    Review of Systems     Objective:    Physical Exam  There were no vitals taken for this visit. Wt Readings from Last 3 Encounters:  02/26/22 197 lb 9.6 oz (89.6 kg)  12/10/21 194 lb (88 kg)  11/06/21 196 lb (88.9 kg)    Health Maintenance Due  Topic Date Due   Zoster Vaccines- Shingrix (1 of 2) Never done   COVID-19 Vaccine (4 - Booster for Pfizer series) 09/28/2020   OPHTHALMOLOGY EXAM  12/08/2021    There are no preventive care reminders to display for this patient.   Lab Results  Component Value Date   TSH 1.380 07/03/2021   Lab Results  Component Value Date   WBC 10.8 10/05/2021   HGB 13.5 10/05/2021   HCT  41.9 10/05/2021   MCV 80 10/05/2021   PLT 288 10/05/2021   Lab Results  Component Value Date   NA 142 10/05/2021   K 5.0 10/05/2021   CO2 27 10/05/2021   GLUCOSE 91 10/05/2021   BUN 15 10/05/2021   CREATININE 0.72 10/05/2021   BILITOT 0.3 10/05/2021  ALKPHOS 97 10/05/2021   AST 16 10/05/2021   ALT 14 10/05/2021   PROT 7.3 10/05/2021   ALBUMIN 4.8 (H) 10/05/2021   CALCIUM 10.2 10/05/2021   ANIONGAP 14 08/10/2019   EGFR 88 10/05/2021   Lab Results  Component Value Date   CHOL 258 (H) 10/05/2021   Lab Results  Component Value Date   HDL 53 10/05/2021   Lab Results  Component Value Date   LDLCALC 183 (H) 10/05/2021   Lab Results  Component Value Date   TRIG 125 10/05/2021   Lab Results  Component Value Date   CHOLHDL 4.9 (H) 10/05/2021   Lab Results  Component Value Date   HGBA1C 7.3 (H) 10/05/2021       Assessment & Plan:   Problem List Items Addressed This Visit   None  No orders of the defined types were placed in this encounter.   No orders of the defined types were placed in this encounter.    Follow-up: No follow-ups on file.  An After Visit Summary was printed and given to the patient.  Rip Harbour, NP [place] [phone]

## 2022-03-15 NOTE — Patient Instructions (Signed)
We will call you with diagnostic mammogram appt Follow-up as needed   Fall Prevention in the Home, Adult Falls can cause injuries and can happen to people of all ages. There are many things you can do to make your home safe and to help prevent falls. Ask for help when making these changes. What actions can I take to prevent falls? General Instructions Use good lighting in all rooms. Replace any light bulbs that burn out. Turn on the lights in dark areas. Use night-lights. Keep items that you use often in easy-to-reach places. Lower the shelves around your home if needed. Set up your furniture so you have a clear path. Avoid moving your furniture around. Do not have throw rugs or other things on the floor that can make you trip. Avoid walking on wet floors. If any of your floors are uneven, fix them. Add color or contrast paint or tape to clearly mark and help you see: Grab bars or handrails. First and last steps of staircases. Where the edge of each step is. If you use a stepladder: Make sure that it is fully opened. Do not climb a closed stepladder. Make sure the sides of the stepladder are locked in place. Ask someone to hold the stepladder while you use it. Know where your pets are when moving through your home. What can I do in the bathroom?     Keep the floor dry. Clean up any water on the floor right away. Remove soap buildup in the tub or shower. Use nonskid mats or decals on the floor of the tub or shower. Attach bath mats securely with double-sided, nonslip rug tape. If you need to sit down in the shower, use a plastic, nonslip stool. Install grab bars by the toilet and in the tub and shower. Do not use towel bars as grab bars. What can I do in the bedroom? Make sure that you have a light by your bed that is easy to reach. Do not use any sheets or blankets for your bed that hang to the floor. Have a firm chair with side arms that you can use for support when you get  dressed. What can I do in the kitchen? Clean up any spills right away. If you need to reach something above you, use a step stool with a grab bar. Keep electrical cords out of the way. Do not use floor polish or wax that makes floors slippery. What can I do with my stairs? Do not leave any items on the stairs. Make sure that you have a light switch at the top and the bottom of the stairs. Make sure that there are handrails on both sides of the stairs. Fix handrails that are broken or loose. Install nonslip stair treads on all your stairs. Avoid having throw rugs at the top or bottom of the stairs. Choose a carpet that does not hide the edge of the steps on the stairs. Check carpeting to make sure that it is firmly attached to the stairs. Fix carpet that is loose or worn. What can I do on the outside of my home? Use bright outdoor lighting. Fix the edges of walkways and driveways and fix any cracks. Remove anything that might make you trip as you walk through a door, such as a raised step or threshold. Trim any bushes or trees on paths to your home. Check to see if handrails are loose or broken and that both sides of all steps have handrails. Install guardrails  along the edges of any raised decks and porches. Clear paths of anything that can make you trip, such as tools or rocks. Have leaves, snow, or ice cleared regularly. Use sand or salt on paths during winter. Clean up any spills in your garage right away. This includes grease or oil spills. What other actions can I take? Wear shoes that: Have a low heel. Do not wear high heels. Have rubber bottoms. Feel good on your feet and fit well. Are closed at the toe. Do not wear open-toe sandals. Use tools that help you move around if needed. These include: Canes. Walkers. Scooters. Crutches. Review your medicines with your doctor. Some medicines can make you feel dizzy. This can increase your chance of falling. Ask your doctor what  else you can do to help prevent falls. Where to find more information Centers for Disease Control and Prevention, [employer]: http://www.wolf.info/ National Institute on Aging: http://kim-miller.com/ Contact a doctor if: You are afraid of falling at home. You feel weak, drowsy, or dizzy at home. You fall at home. Summary There are many simple things that you can do to make your home safe and to help prevent falls. Ways to make your home safe include removing things that can make you trip and installing grab bars in the bathroom. Ask for help when making these changes in your home. This information is not intended to replace advice given to you by your health care provider. Make sure you discuss any questions you have with your health care provider. Document Revised: 05/07/2021 Document Reviewed: 03/08/2020 Elsevier Patient Education  Caitlin [name].

## 2022-03-17 ENCOUNTER — Encounter: Payer: Self-pay | Admitting: Nurse Practitioner

## 2022-04-04 ENCOUNTER — Ambulatory Visit (INDEPENDENT_AMBULATORY_CARE_PROVIDER_SITE_OTHER): Payer: Medicare Other

## 2022-04-04 ENCOUNTER — Other Ambulatory Visit: Payer: Self-pay | Admitting: Nurse Practitioner

## 2022-04-04 VITALS — BP 142/76 | HR 85 | Resp 16 | Ht 62.0 in | Wt 193.0 lb

## 2022-04-04 DIAGNOSIS — Z9189 Other specified personal risk factors, not elsewhere classified: Secondary | ICD-10-CM

## 2022-04-04 DIAGNOSIS — Z0000 Encounter for general adult medical examination without abnormal findings: Secondary | ICD-10-CM

## 2022-04-04 DIAGNOSIS — N959 Unspecified menopausal and perimenopausal disorder: Secondary | ICD-10-CM

## 2022-04-04 DIAGNOSIS — Z6835 Body mass index (BMI) 35.0-35.9, adult: Secondary | ICD-10-CM

## 2022-04-04 DIAGNOSIS — I10 Essential (primary) hypertension: Secondary | ICD-10-CM

## 2022-04-04 NOTE — Patient Instructions (Signed)
Caitlin [name] , Thank you for taking time to come for your Medicare Wellness Visit. I appreciate your ongoing commitment to your health goals. Please review the following plan we discussed and let me know if I can assist you in the future.   Screening recommendations/referrals: Colonoscopy: Complete Mammogram: we will let you know your results after going today Bone Density: Ordered Recommended yearly ophthalmology/optometry visit for glaucoma screening and checkup Recommended yearly dental visit for hygiene and checkup  Vaccinations: Influenza vaccine: Call to schedule your appointment this fall Pneumococcal vaccine: Complete Tdap vaccine: up to date Shingles vaccine: Due - you can get this at most local pharmacies    Advanced directives: Please bring a copy for your medical record  Conditions/risks identified: Exercise - Go to the [facility], get involved in some of the classes there   Preventive Care 65 Years and Older, Female   Preventive care refers to lifestyle choices and visits with your health care provider that can promote health and wellness.  What does preventive care include? A yearly physical exam. This is also called an annual well check. Dental exams once or twice a year. Routine eye exams. Ask your health care provider how often you should have your eyes checked. Personal lifestyle choices, including: Daily care of your teeth and gums. Regular physical activity. Eating a healthy diet. Avoiding tobacco and drug use. Limiting alcohol use. Practicing safe sex. Taking low-dose aspirin every day. Taking vitamin and mineral supplements as recommended by your health care provider.  What happens during an annual well check? The services and screenings done by your health care provider during your annual well check will depend on your age, overall health, lifestyle risk factors, and family history of disease.  Counseling Your health care provider may ask you questions about  your: Alcohol use. Tobacco use. Drug use. Emotional well-being. Home and relationship well-being. Sexual activity. Eating habits. History of falls. Memory and ability to understand (cognition). Work and work Statistician. Reproductive health.  Screening You may have the following tests or measurements: Height, weight, and BMI. Blood pressure. Lipid and cholesterol levels. These may be checked every 5 years, or more frequently if you are over 57 years old. Skin check. Lung cancer screening. You may have this screening every year starting at age 42 if you have a 30-pack-year history of smoking and currently smoke or have quit within the past 15 years. Fecal occult blood test (FOBT) of the stool. You may have this test every year starting at age 23. Flexible sigmoidoscopy or colonoscopy. You may have a sigmoidoscopy every 5 years or a colonoscopy every 10 years starting at age 47. Hepatitis C blood test. Hepatitis B blood test. Sexually transmitted disease (STD) testing. Diabetes screening. This is done by checking your blood sugar (glucose) after you have not eaten for a while (fasting). You may have this done every 1-3 years. Bone density scan. This is done to screen for osteoporosis. You may have this done starting at age 52. Mammogram. This may be done every 1-2 years. Talk to your health care provider about how often you should have regular mammograms. Talk with your health care provider about your test results, treatment options, and if necessary, the need for more tests.  Vaccines Your health care provider may recommend certain vaccines, such as: Influenza vaccine. This is recommended every year. Tetanus, diphtheria, and acellular pertussis (Tdap, Td) vaccine. You may need a Td booster every 10 years. Zoster vaccine. You may need this after age 42.  Pneumococcal 13-valent conjugate (PCV13) vaccine. One dose is recommended after age 49. Pneumococcal polysaccharide (PPSV23) vaccine.  One dose is recommended after age 74. Talk to your health care provider about which screenings and vaccines you need and how often you need them.  This information is not intended to replace advice given to you by your health care provider. Make sure you discuss any questions you have with your health care provider. Document Released: 09/01/2015 Document Revised: 04/24/2016 Document Reviewed: 06/06/2015 Elsevier Interactive Patient Education  2017 [place] Prevention in the Home  Falls can cause injuries. They can happen to people of all ages. There are many things you can do to make your home safe and to help prevent falls.  What can I do on the outside of my home? Regularly fix the edges of walkways and driveways and fix any cracks. Remove anything that might make you trip as you walk through a door, such as a raised step or threshold. Trim any bushes or trees on the path to your home. Use bright outdoor lighting. Clear any walking paths of anything that might make someone trip, such as rocks or tools. Regularly check to see if handrails are loose or broken. Make sure that both sides of any steps have handrails. Any raised decks and porches should have guardrails on the edges. Have any leaves, snow, or ice cleared regularly. Use sand or salt on walking paths during winter. Clean up any spills in your garage right away. This includes oil or grease spills.  What can I do in the bathroom? Use night lights. Install grab bars by the toilet and in the tub and shower. Do not use towel bars as grab bars. Use non-skid mats or decals in the tub or shower. If you need to sit down in the shower, use a plastic, non-slip stool. Keep the floor dry. Clean up any water that spills on the floor as soon as it happens. Remove soap buildup in the tub or shower regularly. Attach bath mats securely with double-sided non-slip rug tape. Do not have throw rugs and other things on the floor that  can make you trip.  What can I do in the bedroom? Use night lights. Make sure that you have a light by your bed that is easy to reach. Do not use any sheets or blankets that are too big for your bed. They should not hang down onto the floor. Have a firm chair that has side arms. You can use this for support while you get dressed. Do not have throw rugs and other things on the floor that can make you trip.  What can I do in the kitchen? Clean up any spills right away. Avoid walking on wet floors. Keep items that you use a lot in easy-to-reach places. If you need to reach something above you, use a strong step stool that has a grab bar. Keep electrical cords out of the way. Do not use floor polish or wax that makes floors slippery. If you must use wax, use non-skid floor wax. Do not have throw rugs and other things on the floor that can make you trip.  What can I do with my stairs? Do not leave any items on the stairs. Make sure that there are handrails on both sides of the stairs and use them. Fix handrails that are broken or loose. Make sure that handrails are as long as the stairways. Check any carpeting to make sure that it  is firmly attached to the stairs. Fix any carpet that is loose or worn. Avoid having throw rugs at the top or bottom of the stairs. If you do have throw rugs, attach them to the floor with carpet tape. Make sure that you have a light switch at the top of the stairs and the bottom of the stairs. If you do not have them, ask someone to add them for you.  What else can I do to help prevent falls? Wear shoes that: Do not have high heels. Have rubber bottoms. Are comfortable and fit you well. Are closed at the toe. Do not wear sandals. If you use a stepladder: Make sure that it is fully opened. Do not climb a closed stepladder. Make sure that both sides of the stepladder are locked into place. Ask someone to hold it for you, if possible. Clearly mark and make sure  that you can see: Any grab bars or handrails. First and last steps. Where the edge of each step is. Use tools that help you move around (mobility aids) if they are needed. These include: Canes. Walkers. Scooters. Crutches. Turn on the lights when you go into a dark area. Replace any light bulbs as soon as they burn out. Set up your furniture so you have a clear path. Avoid moving your furniture around. If any of your floors are uneven, fix them. If there are any pets around you, be aware of where they are. Review your medicines with your doctor. Some medicines can make you feel dizzy. This can increase your chance of falling. Ask your doctor what other things that you can do to help prevent falls.  This information is not intended to replace advice given to you by your health care provider. Make sure you discuss any questions you have with your health care provider. Document Released: 06/01/2009 Document Revised: 01/11/2016 Document Reviewed: 09/09/2014 Elsevier Interactive Patient Education  2017 [employer].

## 2022-04-04 NOTE — Progress Notes (Signed)
Subjective:   Caitlin [name] is a 76 y.o. female who presents for Medicare Annual (Subsequent) preventive examination.  This wellness visit is conducted by a nurse.  The patient's medications were reviewed and reconciled since the patient's last visit.  History details were provided by the patient.  The history appears to be reliable.    Medical History: Patient history and Family history was reviewed  Medications, Allergies, and preventative health maintenance was reviewed and updated.  Cardiac Risk Factors include: advanced age (>66mn, >>3women);diabetes mellitus;dyslipidemia;hypertension;obesity (BMI >30kg/m2)     Objective:    Today's Vitals   04/04/22 0950 04/04/22 1116 04/04/22 1118  BP:  (!) 158/80 (!) 142/76  Pulse:  85   Resp:  16   SpO2:  96%   Weight:  193 lb (87.5 kg)   Height:  '5\' 2"'$  (1.575 m)   PainSc: 3      Body mass index is 35.3 kg/m.     10/10/2021    1:01 PM 08/10/2019    2:38 AM  Advanced Directives  Does Patient Have a Medical Advance Directive? No No  Would patient like information on creating a medical advance directive? No - Patient declined No - Patient declined    Current Medications (verified) Outpatient Encounter Medications as of 04/04/2022  Medication Sig   albuterol (PROVENTIL) (2.5 MG/3ML) 0.083% nebulizer solution Take 3 mLs (2.5 mg total) by nebulization every 6 (six) hours as needed for wheezing or shortness of breath.   albuterol (VENTOLIN HFA) 108 (90 Base) MCG/ACT inhaler Inhale 2 puffs into the lungs every 6 (six) hours as needed for wheezing or shortness of breath.   BD PEN NEEDLE NANO 2ND GEN 32G X 4 MM MISC 1 EACH BY DOES NOT APPLY ROUTE IN THE MORNING AND AT BEDTIME.   Budeson-Glycopyrrol-Formoterol (BREZTRI AEROSPHERE) 160-9-4.8 MCG/ACT AERO Inhale 2 puffs into the lungs in the morning and at bedtime.   buPROPion (WELLBUTRIN XL) 300 MG 24 hr tablet TAKE ONE TABLET IN MORNING FOR ONE WEEK THEN INCREASE TO TWO TABLETS.   calcium  carbonate (TUMS - DOSED IN MG ELEMENTAL CALCIUM) 500 MG chewable tablet Chew 2 tablets by mouth daily as needed for indigestion.    Continuous Blood Gluc Receiver (FREESTYLE LIBRE 14 DAY READER) DEVI USE TO CHECK BLOOD SUGARS DAILY   Continuous Blood Gluc Sensor (FREESTYLE LIBRE 14 DAY SENSOR) MISC SMARTSIG:1 Topical Every 2 Weeks   fexofenadine (ALLEGRA) 180 MG tablet Take 180 mg by mouth daily as needed for allergies or rhinitis.   hydrOXYzine (VISTARIL) 25 MG capsule TAKE 1 CAPSULE (25 MG TOTAL) BY MOUTH EVERY 8 (EIGHT) HOURS AS NEEDED FOR ANXIETY.   Insulin Degludec (TRESIBA FLEXTOUCH) 200 UNIT/ML SOPN Inject 50 Units into the skin every evening.   Insulin Pen Needle 31G X 8 MM MISC Inject 1 Units into the skin daily.   omeprazole (PRILOSEC) 40 MG capsule TAKE 1 CAPSULE(S) BY MOUTH DAILY IN THE MORNING   oxybutynin (DITROPAN-XL) 10 MG 24 hr tablet Take 10 mg by mouth daily.   pregabalin (LYRICA) 100 MG capsule 1 am, one at noon, 2 qhs   propranolol ER (INDERAL LA) 80 MG 24 hr capsule TAKE 1 CAPSULE BY MOUTH EVERY DAY   rivaroxaban (XARELTO) 20 MG TABS tablet Take by mouth at bedtime.   rosuvastatin (CRESTOR) 40 MG tablet Take 1 tablet (40 mg total) by mouth daily.   Semaglutide,0.25 or 0.'5MG'$ /DOS, 2 MG/1.5ML SOPN Inject 0.5 mg into the skin every Monday.  valsartan (DIOVAN) 320 MG tablet Take 1 tablet (320 mg total) by mouth daily.   VITAMIN D-VITAMIN K PO Take 1 tablet by mouth every evening.   No facility-administered encounter medications on file as of 04/04/2022.    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[place]) 12/13/2019   Morbid obesity ([place]) 12/13/2019   Obstructive sleep apnea    PONV (postoperative nausea and vomiting)    "not the last few times"   RLS (restless legs syndrome)    S/P ablation of atrial flutter 02/12/2016   S/P lumbar fusion 08/04/2019   Sleep apnea    Upper respiratory tract infection due to COVID-19 virus 08/14/2021   Past Surgical History:  Procedure Laterality Date   ATRIAL FIBRILLATION ABLATION     x3   CATARACT EXTRACTION Bilateral    COLONOSCOPY  01/04/2013   Moderate predominantly sigmoid diverticulosis. Small internal hemorrhoids. Otherwise normal colonosopy.    LUMBAR WOUND DEBRIDEMENT N/A 08/09/2019   Procedure: LUMBAR WOUND DEBRIDEMENT Evacuation of  EPIDURAL HEMATOMA;  Surgeon: Newman Pies, MD;  Location: [facility];  Service: Neurosurgery;  Laterality: N/A;   Family History  Problem Relation Age of Onset   Breast cancer Mother 53   Heart attack Father 74   Mental illness Sister    Heart Problems Brother 46   Cancer Brother    Breast cancer Paternal Grandmother    Colon cancer Paternal Aunt 39   Social History   Socioeconomic History   Marital status: Married    Spouse name: Shanon Brow   Number of children: 0  Occupational History   Occupation: Retired Engineer, building services  Tobacco Use   Smoking status: Never    Passive exposure: Past   Smokeless tobacco: Never  Vaping Use   Vaping Use: Never used  Substance and Sexual Activity   Alcohol use: Not Currently   Drug use: Never   Sexual activity: Not Currently   Social Determinants of Health   Financial Resource Strain: Low Risk  (04/04/2022)   Overall Financial Resource Strain (CARDIA)    Difficulty of Paying Living Expenses: Not hard at all  Food Insecurity: No Food Insecurity  (04/04/2022)   Hunger Vital Sign    Worried About Running Out of Food in the Last Year: Never true    Ran Out of Food in the Last Year: Never true  Transportation Needs: No Transportation Needs (04/04/2022)   PRAPARE - Hydrologist (Medical): No    Lack of Transportation (Non-Medical): No  Physical Activity: Inactive (04/04/2022)   Exercise Vital Sign    Days of Exercise per Week: 0 days    Minutes of Exercise per Session: 0 min  Stress: No Stress Concern Present (04/04/2022)   [place]    Feeling of Stress : Not at all  Social Connections: Not  on file    Tobacco Counseling Counseling given: Patient does not use tobacco products   Clinical Intake:  Pre-visit preparation completed: Yes Pain : 0-10 Pain Score: 3  Pain Type: Chronic pain Pain Location: Back   BMI - recorded: 35.3 Nutritional Status: BMI > 30  Obese Nutritional Risks: None Diabetes: Yes (last A1C 7.3) CBG done?: No Did pt. bring in CBG monitor from home?: No How often do you need to have someone help you when you read instructions, pamphlets, or other written materials from your doctor or pharmacy?: 1 - Never Interpreter Needed?: No    Activities of Daily Living    04/04/2022   11:35 AM 06/26/2021   11:01 AM  In your present state of health, do you have any difficulty performing the following activities:  Hearing? 0 0  Vision? 0 0  Difficulty concentrating or making decisions? 0 0  Walking or climbing stairs? 0 0  Dressing or bathing? 0 0  Doing errands, shopping? 0 0  Preparing Food and eating ? N   Using the Toilet? N   In the past six months, have you accidently leaked urine? N   Do you have problems with loss of bowel control? N   Managing your Medications? N   Managing your Finances? N   Housekeeping or managing your Housekeeping? N     Patient Care Team: Rochel Brome, MD as PCP - General (Family  Medicine) Alanson Aly, MD as Referring Physician (Internal Medicine) Marcial Pacas, MD as Consulting Physician (Neurology) Richardo Priest, MD as Consulting Physician (Cardiology) Ulyses Southward., MD (Urology) Eustace Moore, MD as Consulting Physician (Neurosurgery) Sharyn Dross., DPM (Podiatry)     Assessment:   This is a routine wellness examination for Caitlin [name].  Hearing/Vision screen No results found.  Dietary issues and exercise activities discussed: Current Exercise Habits: The patient does not participate in regular exercise at present, Exercise limited by: None identified   Goals Addressed             This Visit's Progress    Exercise 3x per week (30 min per time)       Use the [employer] membership       Depression Screen    04/04/2022   11:30 AM 02/26/2022   11:17 AM 10/05/2021    8:33 AM 08/21/2021    3:33 PM 06/26/2021   11:02 AM 06/26/2021   11:01 AM 10/10/2020    1:46 PM  PHQ 2/9 Scores  PHQ - 2 Score 0 0 '2 4 6 '$ 0 0  PHQ- 9 Score   '9 13 12      '$ Fall Risk    04/04/2022   11:34 AM 02/26/2022   11:17 AM 10/05/2021    8:31 AM 07/03/2021    9:13 AM 06/26/2021   11:01 AM  Fall Risk   Falls in the past year? 1 1 0 1 1  Number falls in past yr: 0 0 0 1 1  Injury with Fall? 1 1 0 0 0  Risk for fall due to : No Fall Risks No Fall Risks  No Fall Risks No Fall Risks  Follow up Falls evaluation completed;Education provided Falls evaluation completed Falls evaluation completed Falls evaluation completed Falls evaluation completed    [place]:  Any stairs in or around the home? No  If so, are there any without handrails? No  Home free of loose throw rugs in walkways, pet beds, electrical cords,  etc? Yes  Adequate lighting in your home to reduce risk of falls? Yes   ASSISTIVE DEVICES UTILIZED TO PREVENT FALLS:  Life alert? No  Use of a cane, walker or w/c? No  Grab bars in the bathroom? No  Shower chair or bench in shower?  No  Elevated toilet seat or a handicapped toilet? No   Gait slow and steady without use of assistive device  Cognitive Function:        04/04/2022   11:36 AM  6CIT Screen  What Year? 0 points  What month? 0 points  What time? 0 points  Count back from 20 0 points  Months in reverse 2 points  Repeat phrase 0 points  Total Score 2 points    Immunizations Immunization History  Administered Date(s) Administered   Fluad Quad(high Dose 65+) 06/30/2019, 06/19/2020, 06/26/2021   Influenza, High Dose Seasonal PF 06/14/2015, 06/25/2017, 06/02/2018   Influenza, Seasonal, Injecte, Preservative Fre 05/21/2012   Influenza,inj,Quad PF,6-35 Mos 05/19/2013, 05/15/2016   Influenza-Unspecified 05/02/2014, 05/20/2015, 05/15/2016   PFIZER(Purple Top)SARS-COV-2 Vaccination 09/17/2019, 10/08/2019, 08/03/2020   Pneumococcal Conjugate-13 06/14/2015   Pneumococcal Polysaccharide-23 05/19/2013   Td 02/27/2009   Tdap 09/26/2020   Zoster, Live 08/16/2011    TDAP status: Up to date  Flu Vaccine status: Due, Education has been provided regarding the importance of this vaccine. Advised may receive this vaccine at local pharmacy or Health Dept. Aware to provide a copy of the vaccination record if obtained from local pharmacy or Health Dept. Verbalized acceptance and understanding.  Pneumococcal vaccine status: Up to date  Covid-19 vaccine status: Information provided on how to obtain vaccines.   Qualifies for Shingles Vaccine? Yes   Zostavax completed No   Shingrix Completed?: No.    Education has been provided regarding the importance of this vaccine. Patient has been advised to call insurance company to determine out of pocket expense if they have not yet received this vaccine. Advised may also receive vaccine at local pharmacy or Health Dept. Verbalized acceptance and understanding.  Screening Tests Health Maintenance  Topic Date Due   Zoster Vaccines- Shingrix (1 of 2) Never done   COVID-19  Vaccine (4 - Pfizer risk series) 09/28/2020   OPHTHALMOLOGY EXAM  12/08/2021   INFLUENZA VACCINE  03/19/2022   HEMOGLOBIN A1C  04/04/2022   FOOT EXAM  10/05/2022   MAMMOGRAM  11/06/2023   COLONOSCOPY (Pts 45-103yr Insurance coverage will need to be confirmed)  10/15/2028   TETANUS/TDAP  09/26/2030   Pneumonia Vaccine 75 Years old  Completed   DEXA SCAN  Completed   HPV VACCINES  Aged Out   Hepatitis C Screening  Discontinued    Health Maintenance  Health Maintenance Due  Topic Date Due   Zoster Vaccines- Shingrix (1 of 2) Never done   COVID-19 Vaccine (4 - Pfizer risk series) 09/28/2020   OPHTHALMOLOGY EXAM  12/08/2021   INFLUENZA VACCINE  03/19/2022   HEMOGLOBIN A1C  04/04/2022    Colorectal cancer screening: Type of screening: Colonoscopy. Completed 10/15/2018. Repeat every 10 years  Bone Density status: [facility] be [facility] provided with contact info and advised to call to schedule appt.  Lung Cancer Screening: (Low Dose CT Chest recommended if Age 75-80years, 30 pack-year currently smoking OR have quit w/in 15years.) does not qualify.    Additional Screening:  Vision Screening: Recommended annual ophthalmology exams for early detection of glaucoma and other disorders of the eye. Is the patient up to date with their  annual eye exam?  Yes  Who is the provider or what is the name of the office in which the patient attends annual eye exams? [facility] in [place] - last eye exam record requested  Dental Screening: Recommended annual dental exams for proper oral hygiene     Plan:    1- Flu Vaccine due in the Fall 2- Shingrix Vaccine due - you can get this at most local pharmacies 3- Eye exam records requested 4- DEXA ordered  I have personally reviewed and noted the following in the patient's chart:   Medical and social history Use of alcohol, tobacco or illicit drugs  Current medications and supplements including opioid prescriptions.  Functional  ability and status Nutritional status Physical activity Advanced directives List of other physicians Hospitalizations, surgeries, and ER visits in previous 12 months Vitals Screenings to include cognitive, depression, and falls Referrals and appointments  In addition, I have reviewed and discussed with patient certain preventive protocols, quality metrics, and best practice recommendations. A written personalized care plan for preventive services as well as general preventive health recommendations were provided to patient.     Erie Noe, LPN   9/75/8832

## 2022-04-06 ENCOUNTER — Encounter: Payer: Self-pay | Admitting: Family Medicine

## 2022-04-06 DIAGNOSIS — M545 Low back pain, unspecified: Secondary | ICD-10-CM | POA: Insufficient documentation

## 2022-04-06 DIAGNOSIS — S34109A Unspecified injury to unspecified level of lumbar spinal cord, initial encounter: Secondary | ICD-10-CM | POA: Insufficient documentation

## 2022-04-06 NOTE — Assessment & Plan Note (Signed)
Order dexa.  

## 2022-04-06 NOTE — Assessment & Plan Note (Signed)
Continue tylenol

## 2022-04-11 ENCOUNTER — Other Ambulatory Visit: Payer: Self-pay | Admitting: Family Medicine

## 2022-04-11 MED ORDER — BUPROPION HCL ER (XL) 300 MG PO TB24
ORAL_TABLET | ORAL | 1 refills | Status: DC
Start: 2022-04-11 — End: 2023-05-04

## 2022-04-15 ENCOUNTER — Other Ambulatory Visit: Payer: Self-pay

## 2022-04-15 ENCOUNTER — Other Ambulatory Visit: Payer: Self-pay | Admitting: Family Medicine

## 2022-04-15 MED ORDER — PREGABALIN 100 MG PO CAPS: ORAL_CAPSULE | ORAL | 1 refills | Status: DC

## 2022-04-15 NOTE — Progress Notes (Unsigned)
Patient called requesting refill of Lyrica be sent to [facility].

## 2022-04-17 ENCOUNTER — Other Ambulatory Visit: Payer: Self-pay | Admitting: Nurse Practitioner

## 2022-04-17 DIAGNOSIS — I10 Essential (primary) hypertension: Secondary | ICD-10-CM

## 2022-04-23 ENCOUNTER — Other Ambulatory Visit: Payer: Self-pay

## 2022-04-23 DIAGNOSIS — N644 Mastodynia: Secondary | ICD-10-CM

## 2022-04-23 DIAGNOSIS — W19XXXS Unspecified fall, sequela: Secondary | ICD-10-CM

## 2022-05-31 ENCOUNTER — Ambulatory Visit: Payer: Medicare Other | Admitting: Family Medicine

## 2022-07-09 ENCOUNTER — Telehealth: Payer: Self-pay

## 2022-07-09 NOTE — Telephone Encounter (Signed)
LM for patient to call the office to schedule a fasting appt - she is overdue.

## 2022-07-15 ENCOUNTER — Encounter: Payer: Self-pay | Admitting: Family Medicine

## 2022-07-15 ENCOUNTER — Ambulatory Visit (INDEPENDENT_AMBULATORY_CARE_PROVIDER_SITE_OTHER): Payer: Medicare Other | Admitting: Nurse Practitioner

## 2022-07-15 VITALS — BP 144/64 | HR 83 | Temp 98.5°F | Ht 62.0 in | Wt 209.0 lb

## 2022-07-15 DIAGNOSIS — Z794 Long term (current) use of insulin: Secondary | ICD-10-CM

## 2022-07-15 DIAGNOSIS — K219 Gastro-esophageal reflux disease without esophagitis: Secondary | ICD-10-CM

## 2022-07-15 DIAGNOSIS — R1013 Epigastric pain: Secondary | ICD-10-CM | POA: Insufficient documentation

## 2022-07-15 DIAGNOSIS — E782 Mixed hyperlipidemia: Secondary | ICD-10-CM

## 2022-07-15 DIAGNOSIS — E1140 Type 2 diabetes mellitus with diabetic neuropathy, unspecified: Secondary | ICD-10-CM | POA: Diagnosis not present

## 2022-07-15 MED ORDER — OMEPRAZOLE 40 MG PO CPDR: 40.0000 mg | DELAYED_RELEASE_CAPSULE | Freq: Two times a day (BID) | ORAL | 0 refills | Status: DC

## 2022-07-15 NOTE — Patient Instructions (Signed)
Increased omeprazole to 40 mg twice a day

## 2022-07-15 NOTE — Assessment & Plan Note (Addendum)
Increased omeprazole to 40 mg twice a day

## 2022-07-15 NOTE — Assessment & Plan Note (Signed)
Check lipids 

## 2022-07-15 NOTE — Assessment & Plan Note (Signed)
Checked labs and pt to return for chronic visit in 2 weeks.

## 2022-07-15 NOTE — Assessment & Plan Note (Signed)
Checking h. Pylori stool ag. Checking labs. Increased omeprazole to 40 mg twice a day

## 2022-07-15 NOTE — Progress Notes (Addendum)
Acute Office Visit  Subjective:    Patient ID: Caitlin [name], female    DOB: May 08, 1946, 76 y.o.   MRN: [id number]  Chief Complaint  Patient presents with   Gastroesophageal Reflux   Abdominal Pain    HPI: Patient is in today for heartburn and epigastric pain for 3 days. She states to feel acid coming up on her esophagus and have the sensation to vomit  She mentioned to take Prilosec at night.  Sour acidic fluid coming up to back up the throat , vomited a almost a cup containing food particles 3 times total since last Friday, no mucus, no blood present.pt is taking OTC mylanta and TUMS but not working. Pt is taking omeprazole once a day 40 mg since last 2 years. Pt thinks it is not related with eating a lot of foods at Thanksgiving.  Will check for H.Pylori Will refer to [facility]  Past Medical History:  Diagnosis Date   Arthritis    Asthma    Atrial fibrillation ([place])    Cauda equina syndrome ([place]) 11/25/8117   Complication of anesthesia    COPD (chronic obstructive pulmonary disease) (HCC)    Depression    Diabetes mellitus without complication (HCC)    Type II   Diabetic neuropathy, type II diabetes mellitus ([place]) 12/13/2019   Dysrhythmia    GERD (gastroesophageal reflux disease)    HTN (hypertension)    Hypercholesteremia    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[place]) 12/13/2019   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[place]) 12/13/2019   Morbid obesity ([place]) 12/13/2019   Obstructive sleep apnea    PONV (postoperative nausea and vomiting)    "not the last few times"   RLS (restless legs syndrome)    S/P ablation of atrial flutter 02/12/2016   S/P lumbar fusion 08/04/2019   Sleep apnea    Upper respiratory tract infection due to COVID-19 virus 08/14/2021    Past Surgical History:  Procedure Laterality Date   ATRIAL FIBRILLATION ABLATION     x3   CATARACT EXTRACTION Bilateral    COLONOSCOPY  01/04/2013   Moderate predominantly  sigmoid diverticulosis. Small internal hemorrhoids. Otherwise normal colonosopy.    LUMBAR WOUND DEBRIDEMENT N/A 08/09/2019   Procedure: LUMBAR WOUND DEBRIDEMENT Evacuation of  EPIDURAL HEMATOMA;  Surgeon: Newman Pies, MD;  Location: [facility];  Service: Neurosurgery;  Laterality: N/A;    Family History  Problem Relation Age of Onset   Breast cancer Mother 36   Heart attack Father 68   Mental illness Sister    Heart Problems Brother 35   Cancer Brother    Breast cancer Paternal Grandmother    Colon cancer Paternal Aunt 61    Social History   Socioeconomic History   Marital status: Married    Spouse name: Shanon Brow   Number of children: Not on file   Years of education: Not on file   Highest education level: Not on file  Occupational History   Occupation: Retired Engineer, building services  Tobacco Use   Smoking status: Never    Passive exposure: Past   Smokeless tobacco: Never  Vaping Use   Vaping Use: Never used  Substance and Sexual Activity   Alcohol use: Not Currently   Drug use: Never   Sexual activity: Not Currently  Other Topics Concern   Not on file  Social History Narrative   Not on file   Social Determinants of Health   Financial Resource Strain: Low Risk  (04/04/2022)  Overall Financial Resource Strain (CARDIA)    Difficulty of Paying Living Expenses: Not hard at all  Food Insecurity: No Food Insecurity (04/04/2022)   Hunger Vital Sign    Worried About Running Out of Food in the Last Year: Never true    Ran Out of Food in the Last Year: Never true  Transportation Needs: No Transportation Needs (04/04/2022)   PRAPARE - Hydrologist (Medical): No    Lack of Transportation (Non-Medical): No  Physical Activity: Inactive (04/04/2022)   Exercise Vital Sign    Days of Exercise per Week: 0 days    Minutes of Exercise per Session: 0 min  Stress: No Stress Concern Present (04/04/2022)   [place]    Feeling of Stress : Not at all  Social Connections: Not on file  Intimate Partner Violence: Not At Risk (04/04/2022)   Humiliation, Afraid, Rape, and Kick questionnaire    Fear of Current or Ex-Partner: No    Emotionally Abused: No    Physically Abused: No    Sexually Abused: No    Outpatient Medications Prior to Visit  Medication Sig Dispense Refill   albuterol (PROVENTIL) (2.5 MG/3ML) 0.083% nebulizer solution Take 3 mLs (2.5 mg total) by nebulization every 6 (six) hours as needed for wheezing or shortness of breath. 75 mL 0   albuterol (VENTOLIN HFA) 108 (90 Base) MCG/ACT inhaler Inhale 2 puffs into the lungs every 6 (six) hours as needed for wheezing or shortness of breath. 8 g 3   BD PEN NEEDLE NANO 2ND GEN 32G X 4 MM MISC 1 EACH BY DOES NOT APPLY ROUTE IN THE MORNING AND AT BEDTIME. 100 each 2   Budeson-Glycopyrrol-Formoterol (BREZTRI AEROSPHERE) 160-9-4.8 MCG/ACT AERO Inhale 2 puffs into the lungs in the morning and at bedtime. 10.7 g 3   buPROPion (WELLBUTRIN XL) 300 MG 24 hr tablet Once daily in am. 90 tablet 1   calcium carbonate (TUMS - DOSED IN MG ELEMENTAL CALCIUM) 500 MG chewable tablet Chew 2 tablets by mouth daily as needed for indigestion.      Continuous Blood Gluc Receiver (FREESTYLE LIBRE 14 DAY READER) DEVI USE TO CHECK BLOOD SUGARS DAILY     Continuous Blood Gluc Sensor (FREESTYLE LIBRE 14 DAY SENSOR) MISC SMARTSIG:1 Topical Every 2 Weeks     diltiazem (CARDIZEM CD) 300 MG 24 hr capsule Take 300 mg by mouth daily.     fexofenadine (ALLEGRA) 180 MG tablet Take 180 mg by mouth daily as needed for allergies or rhinitis.     furosemide (LASIX) 80 MG tablet Take by mouth.     hydrOXYzine (VISTARIL) 25 MG capsule TAKE 1 CAPSULE (25 MG TOTAL) BY MOUTH EVERY 8 (EIGHT) HOURS AS NEEDED FOR ANXIETY. 270 capsule 1   Insulin Degludec (TRESIBA FLEXTOUCH) 200 UNIT/ML SOPN Inject 50 Units into the skin every evening.     Insulin Pen Needle 31G X 8 MM MISC Inject  1 Units into the skin daily.     oxybutynin (DITROPAN-XL) 10 MG 24 hr tablet Take 10 mg by mouth daily.     pregabalin (LYRICA) 100 MG capsule 1 am, one at noon, 2 qhs 360 capsule 1   rivaroxaban (XARELTO) 20 MG TABS tablet Take by mouth at bedtime.     Semaglutide,0.25 or 0.5MG/DOS, 2 MG/1.5ML SOPN Inject 0.5 mg into the skin every Monday.      valsartan (DIOVAN) 320 MG tablet Take 1 tablet (320  mg total) by mouth daily. 90 tablet 3   VITAMIN D-VITAMIN K PO Take 1 tablet by mouth every evening.     omeprazole (PRILOSEC) 40 MG capsule TAKE 1 CAPSULE(S) BY MOUTH DAILY IN THE MORNING 90 capsule 3   Apple Cid Vn-Grn Tea-Bit Or-Cr (APPLE CIDER VINEGAR PLUS PO) Take by mouth.     rosuvastatin (CRESTOR) 40 MG tablet Take 1 tablet (40 mg total) by mouth daily. (Patient not taking: Reported on 07/15/2022) 90 tablet 0   propranolol ER (INDERAL LA) 80 MG 24 hr capsule TAKE 1 CAPSULE BY MOUTH EVERY DAY 90 capsule 1   No facility-administered medications prior to visit.    Allergies  Allergen Reactions   Codeine Nausea Only    Review of Systems  Constitutional:  Negative for chills, fatigue and fever.  HENT:  Negative for congestion, ear pain and sore throat.   Respiratory:  Positive for shortness of breath. Negative for cough.   Cardiovascular:  Negative for chest pain and palpitations.  Gastrointestinal:  Positive for abdominal pain (Epigastric pain) and vomiting. Negative for constipation, diarrhea and nausea.  Endocrine: Negative for polydipsia, polyphagia and polyuria.  Genitourinary:  Negative for difficulty urinating and dysuria.  Musculoskeletal:  Negative for arthralgias, back pain and myalgias.  Skin:  Negative for rash.  Neurological:  Negative for headaches.  Psychiatric/Behavioral:  Negative for dysphoric mood. The patient is not nervous/anxious.        Objective:    Physical Exam Vitals reviewed.  Constitutional:      Appearance: Normal appearance. She is obese.  Neck:      Vascular: No carotid bruit.  Cardiovascular:     Rate and Rhythm: Normal rate and regular rhythm.     Heart sounds: Normal heart sounds.  Pulmonary:     Effort: Pulmonary effort is normal.     Breath sounds: Normal breath sounds.  Abdominal:     General: Bowel sounds are normal.     Palpations: Abdomen is soft.     Tenderness: There is abdominal tenderness (epigastric).  Neurological:     Mental Status: She is alert and oriented to person, place, and time.  Psychiatric:        Mood and Affect: Mood normal.        Behavior: Behavior normal.    BP (!) 144/64 (BP Location: Right Arm, Patient Position: Sitting)   Pulse 83   Temp 98.5 F (36.9 C)   Ht _0  (1.575 m)   Wt 209 lb (94.8 kg)   SpO2 92%   BMI 38.23 kg/m  Wt Readings from Last 3 Encounters:  07/15/22 209 lb (94.8 kg)  04/04/22 193 lb (87.5 kg)  03/15/22 195 lb 9.6 oz (88.7 kg)    Health Maintenance Due  Topic Date Due   Zoster Vaccines- Shingrix (1 of 2) Never done   OPHTHALMOLOGY EXAM  12/08/2021   COVID-19 Vaccine (4 - 2023-24 season) 04/19/2022    There are no preventive care reminders to display for this patient.   Lab Results  Component Value Date   TSH 3.630 07/15/2022   Lab Results  Component Value Date   WBC 8.2 07/15/2022   HGB 13.6 07/15/2022   HCT 42.1 07/15/2022   MCV 88 07/15/2022   PLT 303 07/15/2022   Lab Results  Component Value Date   NA 137 07/15/2022   K 4.9 07/15/2022   CO2 25 07/15/2022   GLUCOSE 342 (H) 07/15/2022   BUN 22 07/15/2022   CREATININE  1.22 (H) 07/15/2022   BILITOT <0.2 07/15/2022   ALKPHOS 106 07/15/2022   AST 12 07/15/2022   ALT 16 07/15/2022   PROT 7.0 07/15/2022   ALBUMIN 4.5 07/15/2022   CALCIUM 10.4 (H) 07/15/2022   ANIONGAP 14 08/10/2019   EGFR 47 (L) 07/15/2022   Lab Results  Component Value Date   CHOL 253 (H) 07/15/2022   Lab Results  Component Value Date   HDL 42 07/15/2022   Lab Results  Component Value Date   LDLCALC 151 (H)  07/15/2022   Lab Results  Component Value Date   TRIG 324 (H) 07/15/2022   Lab Results  Component Value Date   CHOLHDL 6.0 (H) 07/15/2022   Lab Results  Component Value Date   HGBA1C 7.8 (H) 07/15/2022       Assessment & Plan:   Problem List Items Addressed This Visit       Digestive   Gastroesophageal reflux disease without esophagitis    Increased omeprazole to 40 mg twice a day      Relevant Medications   omeprazole (PRILOSEC) 40 MG capsule   Other Relevant Orders   H. pylori breath test     Endocrine   Type 2 diabetes mellitus with diabetic neuropathy, with long-term current use of insulin ([place])    Checked labs and pt to return for chronic visit in 2 weeks.      Relevant Orders   Microalbumin / creatinine urine ratio (Completed)   CBC with Differential/Platelet (Completed)   Comprehensive metabolic panel (Completed)   Hemoglobin A1c (Completed)     Other   Mixed hyperlipidemia    Check lipids      Relevant Medications   furosemide (LASIX) 80 MG tablet   diltiazem (CARDIZEM CD) 300 MG 24 hr capsule   Other Relevant Orders   Lipid panel (Completed)   TSH (Completed)   Epigastric pain - Primary    Checking h. Pylori stool ag. Checking labs. Increased omeprazole to 40 mg twice a day          Meds ordered this encounter  Medications   omeprazole (PRILOSEC) 40 MG capsule    Sig: Take 1 capsule (40 mg total) by mouth in the morning and at bedtime.    Dispense:  180 capsule    Refill:  0    Order Specific Question:   Supervising Provider    AnswerShelton Silvas    Orders Placed This Encounter  Procedures   H. pylori breath test   Microalbumin / creatinine urine ratio   CBC with Differential/Platelet   Comprehensive metabolic panel   Hemoglobin A1c   Lipid panel   TSH   Cardiovascular Risk Assessment      Follow-up: Return in about 2 weeks (around 07/29/2022) for Epigastric pain follow up.  An After Visit Summary was  printed and given to the patient.  Neil Crouch, [place] [phone]

## 2022-07-16 ENCOUNTER — Telehealth: Payer: Self-pay

## 2022-07-16 ENCOUNTER — Ambulatory Visit: Payer: Medicare Other | Admitting: Physician Assistant

## 2022-07-16 ENCOUNTER — Ambulatory Visit (INDEPENDENT_AMBULATORY_CARE_PROVIDER_SITE_OTHER): Payer: Medicare Other

## 2022-07-16 ENCOUNTER — Ambulatory Visit: Payer: Medicare Other

## 2022-07-16 DIAGNOSIS — Z23 Encounter for immunization: Secondary | ICD-10-CM | POA: Diagnosis not present

## 2022-07-16 NOTE — Telephone Encounter (Signed)
error 

## 2022-07-17 LAB — COMPREHENSIVE METABOLIC PANEL
ALT: 16 IU/L (ref 0–32)
AST: 12 IU/L (ref 0–40)
Albumin/Globulin Ratio: 1.8 (ref 1.2–2.2)
Albumin: 4.5 g/dL (ref 3.8–4.8)
Alkaline Phosphatase: 106 IU/L (ref 44–121)
BUN/Creatinine Ratio: 18 (ref 12–28)
BUN: 22 mg/dL (ref 8–27)
Bilirubin Total: <0.2 mg/dL (ref 0.0–1.2)
CO2: 25 mmol/L (ref 20–29)
Calcium: 10.4 mg/dL — ABNORMAL HIGH (ref 8.7–10.3)
Chloride: 99 mmol/L (ref 96–106)
Creatinine, Ser: 1.22 mg/dL — ABNORMAL HIGH (ref 0.57–1.00)
Globulin, Total: 2.5 g/dL (ref 1.5–4.5)
Glucose: 342 mg/dL — ABNORMAL HIGH (ref 70–99)
Potassium: 4.9 mmol/L (ref 3.5–5.2)
Sodium: 137 mmol/L (ref 134–144)
Total Protein: 7 g/dL (ref 6.0–8.5)
eGFR: 47 mL/min/{1.73_m2} — ABNORMAL LOW (ref >59)

## 2022-07-17 LAB — LIPID PANEL
Chol/HDL Ratio: 6 ratio — ABNORMAL HIGH (ref 0.0–4.4)
Cholesterol, Total: 253 mg/dL — ABNORMAL HIGH (ref 100–199)
HDL: 42 mg/dL (ref >39)
LDL Chol Calc (NIH): 151 mg/dL — ABNORMAL HIGH (ref 0–99)
Triglycerides: 324 mg/dL — ABNORMAL HIGH (ref 0–149)
VLDL Cholesterol Cal: 60 mg/dL — ABNORMAL HIGH (ref 5–40)

## 2022-07-17 LAB — TSH: TSH: 3.63 u[IU]/mL (ref 0.450–4.500)

## 2022-07-17 LAB — CBC WITH DIFFERENTIAL/PLATELET
Basophils Absolute: 0.1 10*3/uL (ref 0.0–0.2)
Basos: 1 %
EOS (ABSOLUTE): 0.3 10*3/uL (ref 0.0–0.4)
Eos: 3 %
Hematocrit: 42.1 % (ref 34.0–46.6)
Hemoglobin: 13.6 g/dL (ref 11.1–15.9)
Immature Grans (Abs): 0 10*3/uL (ref 0.0–0.1)
Immature Granulocytes: 0 %
Lymphocytes Absolute: 2.4 10*3/uL (ref 0.7–3.1)
Lymphs: 29 %
MCH: 28.6 pg (ref 26.6–33.0)
MCHC: 32.3 g/dL (ref 31.5–35.7)
MCV: 88 fL (ref 79–97)
Monocytes Absolute: 1 10*3/uL — ABNORMAL HIGH (ref 0.1–0.9)
Monocytes: 12 %
Neutrophils Absolute: 4.5 10*3/uL (ref 1.4–7.0)
Neutrophils: 55 %
Platelets: 303 10*3/uL (ref 150–450)
RBC: 4.76 x10E6/uL (ref 3.77–5.28)
RDW: 13.6 % (ref 11.7–15.4)
WBC: 8.2 10*3/uL (ref 3.4–10.8)

## 2022-07-17 LAB — MICROALBUMIN / CREATININE URINE RATIO
Creatinine, Urine: 200.4 mg/dL
Microalb/Creat Ratio: 15 mg/g creat (ref 0–29)
Microalbumin, Urine: 30 ug/mL

## 2022-07-17 LAB — HEMOGLOBIN A1C
Est. average glucose Bld gHb Est-mCnc: 177 mg/dL
Hgb A1c MFr Bld: 7.8 % — ABNORMAL HIGH (ref 4.8–5.6)

## 2022-07-17 LAB — CARDIOVASCULAR RISK ASSESSMENT

## 2022-07-22 ENCOUNTER — Encounter: Payer: Self-pay | Admitting: *Deleted

## 2022-07-22 ENCOUNTER — Other Ambulatory Visit: Payer: Self-pay

## 2022-07-22 NOTE — Progress Notes (Signed)
[facility] Quality Team Note  Name: Caitlin [name] Date of Birth: 30-Nov-1945 MRN: [id number] Date: 07/22/2022  [facility] Quality Team has reviewed this patient's chart, please see recommendations below:  [facility] Quality Other; (Spoke to pt previously and she was going to call [facility] to arrange bone density/dexa.  Order is on file.  Tried to call pt back to f/u but could not get pt.  Will you see if pt has completed or arranged dexa when she comes in on 07/29/2022?  Pt has [facility] deadline of 08/25/2022.)

## 2022-07-24 MED ORDER — ICOSAPENT ETHYL 1 G PO CAPS: 2.0000 g | ORAL_CAPSULE | Freq: Two times a day (BID) | ORAL | 2 refills | Status: DC

## 2022-07-24 MED ORDER — SEMAGLUTIDE(0.25 OR 0.5MG/DOS) 2 MG/1.5ML ~~LOC~~ SOPN: 1.0000 mg | PEN_INJECTOR | SUBCUTANEOUS | 0 refills | Status: DC

## 2022-07-24 MED ORDER — ROSUVASTATIN CALCIUM 40 MG PO TABS: 40.0000 mg | ORAL_TABLET | Freq: Every day | ORAL | 0 refills | Status: DC

## 2022-07-25 ENCOUNTER — Other Ambulatory Visit (HOSPITAL_COMMUNITY): Payer: Self-pay | Admitting: Neurological Surgery

## 2022-07-25 ENCOUNTER — Ambulatory Visit: Payer: Medicare Other | Admitting: Gastroenterology

## 2022-07-25 DIAGNOSIS — M5416 Radiculopathy, lumbar region: Secondary | ICD-10-CM

## 2022-07-27 ENCOUNTER — Ambulatory Visit (HOSPITAL_COMMUNITY)
Admission: RE | Admit: 2022-07-27 | Discharge: 2022-07-27 | Disposition: A | Discharge status: Home / Self Care | Payer: Medicare Other | Source: Ambulatory Visit | Attending: Neurological Surgery | Admitting: Neurological Surgery

## 2022-07-27 DIAGNOSIS — M5416 Radiculopathy, lumbar region: Secondary | ICD-10-CM | POA: Diagnosis present

## 2022-07-29 ENCOUNTER — Ambulatory Visit (INDEPENDENT_AMBULATORY_CARE_PROVIDER_SITE_OTHER): Payer: Medicare Other | Admitting: Nurse Practitioner

## 2022-07-29 ENCOUNTER — Encounter: Payer: Self-pay | Admitting: Nurse Practitioner

## 2022-07-29 VITALS — BP 144/80 | HR 85 | Temp 97.3°F | Resp 16 | Ht 63.0 in | Wt 270.0 lb

## 2022-07-29 DIAGNOSIS — M47816 Spondylosis without myelopathy or radiculopathy, lumbar region: Secondary | ICD-10-CM

## 2022-07-29 DIAGNOSIS — E1140 Type 2 diabetes mellitus with diabetic neuropathy, unspecified: Secondary | ICD-10-CM

## 2022-07-29 DIAGNOSIS — I480 Paroxysmal atrial fibrillation: Secondary | ICD-10-CM

## 2022-07-29 DIAGNOSIS — K219 Gastro-esophageal reflux disease without esophagitis: Secondary | ICD-10-CM | POA: Diagnosis not present

## 2022-07-29 DIAGNOSIS — E1159 Type 2 diabetes mellitus with other circulatory complications: Secondary | ICD-10-CM | POA: Diagnosis not present

## 2022-07-29 DIAGNOSIS — R1013 Epigastric pain: Secondary | ICD-10-CM | POA: Diagnosis not present

## 2022-07-29 DIAGNOSIS — Z794 Long term (current) use of insulin: Secondary | ICD-10-CM

## 2022-07-29 DIAGNOSIS — G4733 Obstructive sleep apnea (adult) (pediatric): Secondary | ICD-10-CM

## 2022-07-29 DIAGNOSIS — J4540 Moderate persistent asthma, uncomplicated: Secondary | ICD-10-CM

## 2022-07-29 DIAGNOSIS — I152 Hypertension secondary to endocrine disorders: Secondary | ICD-10-CM

## 2022-07-29 DIAGNOSIS — E782 Mixed hyperlipidemia: Secondary | ICD-10-CM

## 2022-07-29 MED ORDER — ROSUVASTATIN CALCIUM 40 MG PO TABS: 40.0000 mg | ORAL_TABLET | Freq: Every day | ORAL | 0 refills | Status: DC

## 2022-07-29 MED ORDER — TRESIBA FLEXTOUCH 200 UNIT/ML ~~LOC~~ SOPN: 56.0000 [IU] | PEN_INJECTOR | Freq: Every evening | SUBCUTANEOUS | 0 refills | Status: AC

## 2022-07-29 NOTE — Assessment & Plan Note (Signed)
Well controlled.  No changes to medicines.  .   

## 2022-07-29 NOTE — Assessment & Plan Note (Signed)
Taking protonix 40 mg BD  Heart burn getting better but not completely resolved  Gastro Patient has appointment Sep 06, 2021 for Endoscopy

## 2022-07-29 NOTE — Assessment & Plan Note (Addendum)
Not at goal.  Patient is taking crestor '40mg'$  OD Prescription due to pick up for Vascepa Consider addition of fenofibrate in future.

## 2022-07-29 NOTE — Assessment & Plan Note (Signed)
Rate controlled. Continue xarelto 20 mg daily.   Takes Diltiazem 300 mg OD

## 2022-07-29 NOTE — Progress Notes (Addendum)
Subjective:  Patient ID: Caitlin [name], female    DOB: [birth year]  Age: 76 y.o. MRN: [id number]  Chief Complaint  Patient presents with   Abdominal Pain   Gastroesophageal Reflux    HPI: Patient is here for a regular follow up regarding her DM, GERD and hyperlipidemia status and want to discuss labs from last lab visits.   For GERD: taking prilosec '40mg'$  BD Patient heart burn and burning is getting better but not completely resolved Patient has appointment with GI @ Sep 06, 2021 for Endoscopy  For DM: Ozempic 1 mg once a week Tresiba 50 units every evening, patient said she takes 70 unit whenever blood sugar higher that 200. Patient has a freestyle libre  Blood sugar run between 100-200 Last blood sugar dropped 3 months ago to 57, but lately sugar is usually upto 200 Last eye check up - 4/23  Hyperlipidemia: High triglyceride 324 VLDL 60 LDL 151 Patient is due to pick up the medicine Vascepa which was added from 07/24/2022 Taking Crestor 40 mg OD    Current Outpatient Medications on File Prior to Visit  Medication Sig Dispense Refill   albuterol (PROVENTIL) (2.5 MG/3ML) 0.083% nebulizer solution Take 3 mLs (2.5 mg total) by nebulization every 6 (six) hours as needed for wheezing or shortness of breath. 75 mL 0   albuterol (VENTOLIN HFA) 108 (90 Base) MCG/ACT inhaler Inhale 2 puffs into the lungs every 6 (six) hours as needed for wheezing or shortness of breath. 8 g 3   Apple Cid Vn-Grn Tea-Bit Or-Cr (APPLE CIDER VINEGAR PLUS PO) Take by mouth.     BD PEN NEEDLE NANO 2ND GEN 32G X 4 MM MISC 1 EACH BY DOES NOT APPLY ROUTE IN THE MORNING AND AT BEDTIME. 100 each 2   Budeson-Glycopyrrol-Formoterol (BREZTRI AEROSPHERE) 160-9-4.8 MCG/ACT AERO Inhale 2 puffs into the lungs in the morning and at bedtime. 10.7 g 3   buPROPion (WELLBUTRIN XL) 300 MG 24 hr tablet Once daily in am. 90 tablet 1   calcium carbonate (TUMS - DOSED IN MG ELEMENTAL CALCIUM) 500 MG chewable tablet Chew 2 tablets  by mouth daily as needed for indigestion.      Continuous Blood Gluc Receiver (FREESTYLE LIBRE 14 DAY READER) DEVI USE TO CHECK BLOOD SUGARS DAILY     Continuous Blood Gluc Sensor (FREESTYLE LIBRE 14 DAY SENSOR) MISC SMARTSIG:1 Topical Every 2 Weeks     diltiazem (CARDIZEM CD) 300 MG 24 hr capsule Take 300 mg by mouth daily.     fexofenadine (ALLEGRA) 180 MG tablet Take 180 mg by mouth daily as needed for allergies or rhinitis.     furosemide (LASIX) 80 MG tablet Take by mouth.     hydrOXYzine (VISTARIL) 25 MG capsule TAKE 1 CAPSULE (25 MG TOTAL) BY MOUTH EVERY 8 (EIGHT) HOURS AS NEEDED FOR ANXIETY. 270 capsule 1   icosapent Ethyl (VASCEPA) 1 g capsule Take 2 capsules (2 g total) by mouth 2 (two) times daily. 120 capsule 2   Insulin Pen Needle 31G X 8 MM MISC Inject 1 Units into the skin daily.     omeprazole (PRILOSEC) 40 MG capsule Take 1 capsule (40 mg total) by mouth in the morning and at bedtime. 180 capsule 0   oxybutynin (DITROPAN-XL) 10 MG 24 hr tablet Take 10 mg by mouth daily.     pregabalin (LYRICA) 100 MG capsule 1 am, one at noon, 2 qhs 360 capsule 1   rivaroxaban (XARELTO) 20 MG TABS tablet Take by  mouth at bedtime.     Semaglutide,0.25 or 0.'5MG'$ /DOS, 2 MG/1.5ML SOPN Inject 1 mg into the skin every Monday. 6 mL 0   valsartan (DIOVAN) 320 MG tablet Take 1 tablet (320 mg total) by mouth daily. 90 tablet 3   VITAMIN D-VITAMIN K PO Take 1 tablet by mouth every evening.     No current facility-administered medications on file prior to visit.   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/13/2019   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[place]) 12/13/2019   Morbid obesity ([place]) 12/13/2019   Obstructive sleep apnea    PONV (postoperative nausea and vomiting)    "not the last few times"   RLS (restless legs syndrome)    S/P ablation of atrial flutter 02/12/2016   S/P lumbar fusion 08/04/2019   Sleep apnea    Upper respiratory tract infection due to COVID-19 virus 08/14/2021   Past Surgical History:  Procedure Laterality Date   ATRIAL FIBRILLATION ABLATION     x3   CATARACT EXTRACTION Bilateral    COLONOSCOPY  01/04/2013   Moderate predominantly sigmoid diverticulosis. Small internal hemorrhoids. Otherwise normal colonosopy.    LUMBAR WOUND DEBRIDEMENT N/A 08/09/2019   Procedure: LUMBAR WOUND DEBRIDEMENT Evacuation of  EPIDURAL HEMATOMA;  Surgeon: Newman Pies, MD;  Location: [facility];  Service: Neurosurgery;  Laterality: N/A;    Family History  Problem Relation Age of Onset   Breast cancer Mother 59   Heart attack Father 44   Mental illness Sister    Heart Problems Brother 55   Cancer Brother    Breast cancer Paternal Grandmother    Colon cancer Paternal Aunt 30   Social History   Socioeconomic History   Marital status: Married    Spouse name: Shanon Brow   Number of children: Not on file   Years of education: Not on file   Highest education level: Not on file  Occupational History   Occupation: Retired Engineer, building services  Tobacco Use   Smoking status: Never    Passive exposure: Past   Smokeless tobacco: Never  Vaping Use   Vaping Use: Never used  Substance and Sexual Activity   Alcohol use: Not Currently   Drug use: Never   Sexual activity: Not Currently  Other Topics Concern   Not on file  Social History Narrative   Not on file   Social Determinants of Health   Financial Resource Strain: Low Risk  (04/04/2022)   Overall Financial Resource Strain  (CARDIA)    Difficulty of Paying Living Expenses: Not hard at all  Food Insecurity: No Food Insecurity (04/04/2022)   Hunger Vital Sign    Worried About Running Out of Food in the Last Year: Never true    Ran Out of Food in the Last Year: Never true  Transportation Needs: No Transportation Needs (04/04/2022)   PRAPARE - Hydrologist (Medical): No    Lack of Transportation (Non-Medical): No  Physical Activity: Inactive (04/04/2022)   Exercise Vital Sign    Days of Exercise per Week: 0 days    Minutes of Exercise per Session: 0 min  Stress: No Stress Concern Present (04/04/2022)   [place]    Feeling of Stress : Not at all  Social Connections: Not on file    Review of Systems  Constitutional:  Negative for chills and fatigue.  HENT:  Negative for congestion, ear pain, sinus pain and sore throat.   Respiratory:  Negative for cough and shortness of breath.   Cardiovascular:  Negative for chest pain and leg swelling.  Gastrointestinal:  Negative for abdominal pain, constipation, diarrhea, nausea and vomiting.  Genitourinary:  Negative for dysuria and frequency.  Musculoskeletal:  Negative for arthralgias, back pain and myalgias.  Neurological:  Negative for dizziness and headaches.     Objective:  BP (!) 144/80   Pulse 85   Temp (!) 97.3 F (36.3 C)   Resp 16   Ht '5\' 3"'$  (1.6 m)   Wt 270 lb (122.5 kg)   SpO2 95%   BMI 47.83 kg/m      07/29/2022    1:56 PM 07/15/2022    3:41 PM 04/04/2022   11:18 AM  BP/Weight  Systolic BP 701 779 390  Diastolic BP 80 64 76  Wt. (Lbs) 270 209   BMI 47.83 kg/m2 38.23 kg/m2     Physical Exam Vitals reviewed.  Constitutional:      Appearance: Normal appearance. She is normal weight.  Neck:     Vascular: No carotid bruit.  Cardiovascular:     Rate and Rhythm: Normal rate and regular rhythm.     Heart sounds: Normal heart sounds.  Pulmonary:      Effort: Pulmonary effort is normal. No respiratory distress.     Breath sounds: Normal breath sounds.  Abdominal:     General: Abdomen is flat. Bowel sounds are normal.     Palpations: Abdomen is soft.     Tenderness: There is no abdominal tenderness.  Genitourinary:    Vagina: Normal.  Neurological:     Mental Status: She is alert and oriented to person, place, and time.  Psychiatric:        Mood and Affect: Mood normal.        Behavior: Behavior normal.       Lab Results  Component Value Date   WBC 8.2 07/15/2022   HGB 13.6 07/15/2022   HCT 42.1 07/15/2022   PLT 303 07/15/2022   GLUCOSE 342 (H) 07/15/2022   CHOL 253 (H) 07/15/2022   TRIG 324 (H) 07/15/2022   HDL 42 07/15/2022   LDLCALC 151 (H) 07/15/2022   ALT 16 07/15/2022   AST 12 07/15/2022   NA 137 07/15/2022   K 4.9 07/15/2022   CL 99 07/15/2022   CREATININE 1.22 (H) 07/15/2022   BUN 22 07/15/2022   CO2 25 07/15/2022   TSH 3.630 07/15/2022   INR 0.9 08/04/2019   HGBA1C 7.8 (H) 07/15/2022      Assessment & Plan:   Problem List Items Addressed This Visit       Cardiovascular and Mediastinum   Hypertension associated with diabetes ([place]) (Chronic)    Chronic, stable.  Continue current regimen. Nutrition: Stressed importance of moderation in sodium intake, saturated fat and cholesterol, caloric balance, sufficient intake of complex carbohydrates, fiber, calcium and iron.   Exercise: Stressed the importance of regular exercise.  Relevant Medications   rosuvastatin (CRESTOR) 40 MG tablet   insulin degludec (TRESIBA FLEXTOUCH) 200 UNIT/ML FlexTouch Pen   Atrial fibrillation (HCC)    Rate controlled. Continue xarelto 20 mg daily.   Takes Diltiazem 300 mg OD       Relevant Medications   rosuvastatin (CRESTOR) 40 MG tablet     Respiratory   Obstructive sleep apnea    Done sleep study in past  Intolerant to CPAP  Patient is asking for something she can tolerate Will find some  resources      Asthma    Well controlled Taking albuterol puff every 4-6 month once        Digestive   Gastroesophageal reflux disease without esophagitis    Taking protonix 40 mg BD  Heart burn getting better but not completely resolved  Gastro Patient has appointment Sep 06, 2021 for Endoscopy         Endocrine   Type 2 diabetes mellitus with diabetic neuropathy, with long-term current use of insulin (HCC)    Increased Tresiba to 56 units everyday Semaglutide 1 mg every Monday  Nutrition: Stressed importance of moderation in sodium intake, saturated fat and cholesterol, caloric balance, sufficient intake of complex carbohydrates, fiber, calcium and iron.    Exercise: Stressed the importance of regular exercise.         Relevant Medications   rosuvastatin (CRESTOR) 40 MG tablet   insulin degludec (TRESIBA FLEXTOUCH) 200 UNIT/ML FlexTouch Pen     Musculoskeletal and Integument   Spondylosis without myelopathy or radiculopathy, lumbar region    Back surgery done 3 years ago by Dr Princella Ion  Recently done MRI 07/28/2022, but result pending  Takes only tylenol for pain        Other   Mixed hyperlipidemia    Not at goal.  Patient is taking crestor '40mg'$  OD Prescription due to pick up for Vascepa Consider addition of fenofibrate in future.       Relevant Medications   rosuvastatin (CRESTOR) 40 MG tablet   Epigastric pain - Primary    Well controlled.  No changes to medicines.        .  Meds ordered this encounter  Medications   rosuvastatin (CRESTOR) 40 MG tablet    Sig: Take 1 tablet (40 mg total) by mouth daily.    Dispense:  90 tablet    Refill:  0    Order Specific Question:   Supervising Provider    Answer:   Shelton Silvas   insulin degludec (TRESIBA FLEXTOUCH) 200 UNIT/ML FlexTouch Pen    Sig: Inject 56 Units into the skin every evening.    Dispense:  27 mL    Refill:  0    90 days prescription    Order Specific Question:    Supervising Provider    Answer:   Shelton Silvas   Total time spent on today's visit was greater than 30 minutes, including both face-to-face time and nonface-to-face time personally spent on review of chart (labs and imaging), discussing labs and goals, discussing further work-up, treatment options, referrals to specialist if needed, reviewing outside records of pertinent, answering patient's questions, and coordinating care.    Follow-up: Return in about 3 months (around 10/28/2022) for CHRONIC, FASTING.  An After Visit Summary was printed and given to the patient.  Neil Crouch, [place] [phone]

## 2022-07-29 NOTE — Assessment & Plan Note (Addendum)
Increased Tresiba to 56 units everyday Semaglutide 1 mg every Monday  Nutrition: Stressed importance of moderation in sodium intake, saturated fat and cholesterol, caloric balance, sufficient intake of complex carbohydrates, fiber, calcium and iron.    Exercise: Stressed the importance of regular exercise.

## 2022-07-29 NOTE — Patient Instructions (Addendum)
Follow up in 3 months for chronic fasting   Cholesterol Content in Foods Cholesterol is a waxy, fat-like substance that helps to carry fat in the blood. The body needs cholesterol in small amounts, but too much cholesterol can cause damage to the arteries and heart. What foods have cholesterol?  Cholesterol is found in animal-based foods, such as meat, seafood, and dairy. Generally, low-fat dairy and lean meats have less cholesterol than full-fat dairy and fatty meats. The milligrams of cholesterol per serving (mg per serving) of common cholesterol-containing foods are listed below. Meats and other proteins Egg -- one large whole egg has 186 mg. Veal shank -- 4 oz (113 g) has 141 mg. Lean ground [place] (93% lean) -- 4 oz (113 g) has 118 mg. Fat-trimmed lamb loin -- 4 oz (113 g) has 106 mg. Lean ground beef (90% lean) -- 4 oz (113 g) has 100 mg. Lobster -- 3.5 oz (99 g) has 90 mg. Pork loin chops -- 4 oz (113 g) has 86 mg. Canned salmon -- 3.5 oz (99 g) has 83 mg. Fat-trimmed beef top loin -- 4 oz (113 g) has 78 mg. Frankfurter -- 1 frank (3.5 oz or 99 g) has 77 mg. Crab -- 3.5 oz (99 g) has 71 mg. Roasted chicken without skin, white meat -- 4 oz (113 g) has 66 mg. Light bologna -- 2 oz (57 g) has 45 mg. Deli-cut [place] -- 2 oz (57 g) has 31 mg. Canned tuna -- 3.5 oz (99 g) has 31 mg. Berniece Salines -- 1 oz (28 g) has 29 mg. Oysters and mussels (raw) -- 3.5 oz (99 g) has 25 mg. Mackerel -- 1 oz (28 g) has 22 mg. Trout -- 1 oz (28 g) has 20 mg. Pork sausage -- 1 link (1 oz or 28 g) has 17 mg. Salmon -- 1 oz (28 g) has 16 mg. Tilapia -- 1 oz (28 g) has 14 mg. Dairy Soft-serve ice cream --  cup (4 oz or 86 g) has 103 mg. Whole-milk yogurt -- 1 cup (8 oz or 245 g) has 29 mg. Cheddar cheese -- 1 oz (28 g) has 28 mg. American cheese -- 1 oz (28 g) has 28 mg. Whole milk -- 1 cup (8 oz or 250 mL) has 23 mg. 2% milk -- 1 cup (8 oz or 250 mL) has 18 mg. Cream cheese -- 1 tablespoon (Tbsp) (14.5 g)  has 15 mg. Cottage cheese --  cup (4 oz or 113 g) has 14 mg. Low-fat (1%) milk -- 1 cup (8 oz or 250 mL) has 10 mg. Sour cream -- 1 Tbsp (12 g) has 8.5 mg. Low-fat yogurt -- 1 cup (8 oz or 245 g) has 8 mg. Nonfat Greek yogurt -- 1 cup (8 oz or 228 g) has 7 mg. Half-and-half cream -- 1 Tbsp (15 mL) has 5 mg. Fats and oils Cod liver oil -- 1 tablespoon (Tbsp) (13.6 g) has 82 mg. Butter -- 1 Tbsp (14 g) has 15 mg. Lard -- 1 Tbsp (12.8 g) has 14 mg. Bacon grease -- 1 Tbsp (12.9 g) has 14 mg. Mayonnaise -- 1 Tbsp (13.8 g) has 5-10 mg. Margarine -- 1 Tbsp (14 g) has 3-10 mg. The items listed above may not be a complete list of foods with cholesterol. Exact amounts of cholesterol in these foods may vary depending on specific ingredients and brands. Contact a dietitian for more information. What foods do not have cholesterol? Most plant-based foods do not have  cholesterol unless you combine them with a food that has cholesterol. Foods without cholesterol include: Grains and cereals. Vegetables. Fruits. Vegetable oils, such as olive, canola, and sunflower oil. Legumes, such as peas, beans, and lentils. Nuts and seeds. Egg whites. The items listed above may not be a complete list of foods that do not have cholesterol. Contact a dietitian for more information. Summary The body needs cholesterol in small amounts, but too much cholesterol can cause damage to the arteries and heart. Cholesterol is found in animal-based foods, such as meat, seafood, and dairy. Generally, low-fat dairy and lean meats have less cholesterol than full-fat dairy and fatty meats. This information is not intended to replace advice given to you by your health care provider. Make sure you discuss any questions you have with your health care provider. Document Revised: 12/15/2020 Document Reviewed: 12/15/2020 Elsevier Patient Education  [place].

## 2022-07-29 NOTE — Assessment & Plan Note (Signed)
Back surgery done 3 years ago by Dr Princella Ion  Recently done MRI 07/28/2022, but result pending  Takes only tylenol for pain

## 2022-07-29 NOTE — Addendum Note (Signed)
Addended byRochel Brome on: 07/29/2022 04:46 PM   Modules accepted: Level of Service

## 2022-07-29 NOTE — Assessment & Plan Note (Signed)
Well controlled Taking albuterol puff every 4-6 month once

## 2022-07-29 NOTE — Assessment & Plan Note (Signed)
Done sleep study in past  Intolerant to CPAP  Patient is asking for something she can tolerate Will find some resources

## 2022-07-29 NOTE — Assessment & Plan Note (Addendum)
Chronic, stable.  Continue current regimen. Nutrition: Stressed importance of moderation in sodium intake, saturated fat and cholesterol, caloric balance, sufficient intake of complex carbohydrates, fiber, calcium and iron.   Exercise: Stressed the importance of regular exercise.

## 2022-09-05 ENCOUNTER — Other Ambulatory Visit: Payer: Self-pay | Admitting: Nurse Practitioner

## 2022-09-05 DIAGNOSIS — E1140 Type 2 diabetes mellitus with diabetic neuropathy, unspecified: Secondary | ICD-10-CM

## 2022-09-06 ENCOUNTER — Telehealth: Payer: Self-pay | Admitting: Gastroenterology

## 2022-09-06 ENCOUNTER — Ambulatory Visit: Payer: Medicare Other | Admitting: Gastroenterology

## 2022-09-06 NOTE — Telephone Encounter (Signed)
Good morning Dr. Lyndel Safe,    Patient called stating she would not be able to come to her appointment with you this afternoon at 1:30 due to being sick.   Patient stated she will call back to reschedule at a later time.

## 2022-09-19 NOTE — Progress Notes (Deleted)
This encounter was created in error - please disregard.

## 2022-10-08 ENCOUNTER — Telehealth: Payer: Self-pay

## 2022-10-08 NOTE — Telephone Encounter (Signed)
Caitlin [name] called to report that she was seen in the ED 48 hours ago with sore throat and swelling.  She reports her uvula is swollen and she is having problems breathing.  Symptoms were reviewed with Dr. Tobie Poet and she advised immediate follow-up at the ED.  Patient verbalized understanding and agreed to go.

## 2022-10-09 ENCOUNTER — Other Ambulatory Visit: Payer: Self-pay | Admitting: Family Medicine

## 2022-10-10 ENCOUNTER — Encounter: Payer: Self-pay | Admitting: Family Medicine

## 2022-10-10 ENCOUNTER — Ambulatory Visit (INDEPENDENT_AMBULATORY_CARE_PROVIDER_SITE_OTHER): Payer: Medicare Other | Admitting: Family Medicine

## 2022-10-10 VITALS — BP 176/118 | HR 96 | Temp 97.5°F | Ht 63.0 in | Wt 213.0 lb

## 2022-10-10 DIAGNOSIS — S0083XA Contusion of other part of head, initial encounter: Secondary | ICD-10-CM

## 2022-10-10 DIAGNOSIS — R42 Dizziness and giddiness: Secondary | ICD-10-CM | POA: Diagnosis not present

## 2022-10-10 DIAGNOSIS — E1159 Type 2 diabetes mellitus with other circulatory complications: Secondary | ICD-10-CM

## 2022-10-10 DIAGNOSIS — B029 Zoster without complications: Secondary | ICD-10-CM | POA: Diagnosis not present

## 2022-10-10 DIAGNOSIS — D6869 Other thrombophilia: Secondary | ICD-10-CM

## 2022-10-10 DIAGNOSIS — J039 Acute tonsillitis, unspecified: Secondary | ICD-10-CM

## 2022-10-10 DIAGNOSIS — I152 Hypertension secondary to endocrine disorders: Secondary | ICD-10-CM

## 2022-10-10 DIAGNOSIS — M79645 Pain in left finger(s): Secondary | ICD-10-CM

## 2022-10-10 DIAGNOSIS — E1142 Type 2 diabetes mellitus with diabetic polyneuropathy: Secondary | ICD-10-CM

## 2022-10-10 DIAGNOSIS — W19XXXA Unspecified fall, initial encounter: Secondary | ICD-10-CM

## 2022-10-10 DIAGNOSIS — J392 Other diseases of pharynx: Secondary | ICD-10-CM | POA: Insufficient documentation

## 2022-10-10 HISTORY — DX: Contusion of other part of head, initial encounter: S00.83XA

## 2022-10-10 NOTE — Progress Notes (Signed)
Acute Office Visit  Subjective:    Patient ID: Caitlin [name], female    DOB: 15-Dec-1945, 77 y.o.   MRN: [id number]  Chief Complaint  Patient presents with   Hypertension    Ringing in ears    HPI Patient is a 77 year old white female with past medical history significant for type 2 diabetes, atrial fibrillation, hyperlipidemia, anemia, and hypertension who presents in significant distress.  Patient is confused and crying.  Patient was seen in the emergency department on Sunday with [facility] for pharyngitis with significant tonsillar edema and was treated with Augmentin, Toradol, Decadron injection, hydrocodone.  Strep test and Mono test is negative. Wbc was slightly elevated.  CT scan shows swelling of tonsils but otherwise was normal.  In addition she was diagnosed with shingles on her right buttock and was given Valtrex.  Patient was discharged home but reports that her blood pressure has been elevated since then.  She has been very dizzy.  She had a fall last night.  She fell forward hitting her face.  She denies any acute nosebleed at the time.  She does not know if she lost consciousness.  Otherwise she has a injury to her left index finger.  She is having difficulty bending it.  In addition she has had increased confusion and emotional lability.  Patient denies headaches.  She also is significantly stressed because her husband Shanon Brow is at [facility] admitted for removal of a pituitary adenoma she says.  Patient's sugars have also been significantly elevated.  She has had 2 low sugar 71-78.  But the rest over the last few days have been anywhere from 204-407.  Sugar right now is 188.  Patient said that her blood pressure has been high for the past week and she has had ringing in the ears.    Past Medical History:  Diagnosis Date   Arthritis    Asthma    Atrial fibrillation (HCC)    Cauda equina syndrome ([place]) [id number]   Complication of anesthesia    COPD (chronic obstructive  pulmonary disease) (HCC)    Depression    Diabetes mellitus without complication (HCC)    Type II   Diabetic neuropathy, type II diabetes mellitus ([place]) 12/13/2019   Dysrhythmia    GERD (gastroesophageal reflux disease)    HTN (hypertension)    Hypercholesteremia    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[place]) 12/13/2019   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[place]) 12/13/2019   Morbid obesity ([place]) 12/13/2019   Obstructive sleep apnea    PONV (postoperative nausea and vomiting)    "not the last few times"   RLS (restless legs syndrome)    S/P ablation of atrial flutter 02/12/2016   S/P lumbar fusion 08/04/2019   Sleep apnea    Upper respiratory tract infection due to COVID-19 virus 08/14/2021    Past Surgical History:  Procedure Laterality Date   ATRIAL FIBRILLATION ABLATION     x3   CATARACT EXTRACTION Bilateral    COLONOSCOPY  01/04/2013   Moderate predominantly sigmoid diverticulosis. Small internal hemorrhoids. Otherwise normal colonosopy.    LUMBAR WOUND DEBRIDEMENT N/A 08/09/2019   Procedure: LUMBAR WOUND DEBRIDEMENT Evacuation of  EPIDURAL HEMATOMA;  Surgeon: Newman Pies, MD;  Location: [place];  Service: Neurosurgery;  Laterality: N/A;    Family History  Problem Relation Age of Onset   Breast cancer Mother 80   Heart attack Father 26   Mental illness Sister    Heart Problems Brother  37   Cancer Brother    Breast cancer Paternal Grandmother    Colon cancer Paternal Aunt 49    Social History   Socioeconomic History   Marital status: Married    Spouse name: Shanon Brow   Number of children: Not on file   Years of education: Not on file   Highest education level: Not on file  Occupational History   Occupation: Retired Engineer, building services  Tobacco Use   Smoking status: Never    Passive exposure: Past   Smokeless tobacco: Never  Vaping Use   Vaping Use: Never used  Substance and Sexual Activity   Alcohol use: Not Currently    Drug use: Never   Sexual activity: Not Currently  Other Topics Concern   Not on file  Social History Narrative   Not on file   Social Determinants of Health   Financial Resource Strain: Low Risk  (04/04/2022)   Overall Financial Resource Strain (CARDIA)    Difficulty of Paying Living Expenses: Not hard at all  Food Insecurity: No Food Insecurity (04/04/2022)   Hunger Vital Sign    Worried About Running Out of Food in the Last Year: Never true    Ran Out of Food in the Last Year: Never true  Transportation Needs: No Transportation Needs (04/04/2022)   PRAPARE - Hydrologist (Medical): No    Lack of Transportation (Non-Medical): No  Physical Activity: Inactive (04/04/2022)   Exercise Vital Sign    Days of Exercise per Week: 0 days    Minutes of Exercise per Session: 0 min  Stress: No Stress Concern Present (04/04/2022)   [place]    Feeling of Stress : Not at all  Social Connections: Not on file  Intimate Partner Violence: Not At Risk (04/04/2022)   Humiliation, Afraid, Rape, and Kick questionnaire    Fear of Current or Ex-Partner: No    Emotionally Abused: No    Physically Abused: No    Sexually Abused: No    Outpatient Medications Prior to Visit  Medication Sig Dispense Refill   albuterol (PROVENTIL) (2.5 MG/3ML) 0.083% nebulizer solution Take 3 mLs (2.5 mg total) by nebulization every 6 (six) hours as needed for wheezing or shortness of breath. 75 mL 0   albuterol (VENTOLIN HFA) 108 (90 Base) MCG/ACT inhaler Inhale 2 puffs into the lungs every 6 (six) hours as needed for wheezing or shortness of breath. 8 g 3   amoxicillin-clavulanate (AUGMENTIN) 875-125 MG tablet Take 1 tablet by mouth every 12 (twelve) hours.     Apple Cid Vn-Grn Tea-Bit Or-Cr (APPLE CIDER VINEGAR PLUS PO) Take by mouth.     BD PEN NEEDLE NANO 2ND GEN 32G X 4 MM MISC 1 EACH BY DOES NOT APPLY ROUTE IN THE MORNING AND  AT BEDTIME. 100 each 2   Budeson-Glycopyrrol-Formoterol (BREZTRI AEROSPHERE) 160-9-4.8 MCG/ACT AERO Inhale 2 puffs into the lungs in the morning and at bedtime. 10.7 g 3   buPROPion (WELLBUTRIN XL) 300 MG 24 hr tablet Once daily in am. 90 tablet 1   calcium carbonate (TUMS - DOSED IN MG ELEMENTAL CALCIUM) 500 MG chewable tablet Chew 2 tablets by mouth daily as needed for indigestion.      Continuous Blood Gluc Receiver (FREESTYLE LIBRE 14 DAY READER) DEVI USE TO CHECK BLOOD SUGARS DAILY     Continuous Blood Gluc Sensor (FREESTYLE LIBRE 14 DAY SENSOR) MISC SMARTSIG:1 Topical Every 2 Weeks  diltiazem (CARDIZEM CD) 300 MG 24 hr capsule Take 300 mg by mouth daily.     fexofenadine (ALLEGRA) 180 MG tablet Take 180 mg by mouth daily as needed for allergies or rhinitis.     furosemide (LASIX) 80 MG tablet Take by mouth.     HYDROcodone-acetaminophen (NORCO/VICODIN) 5-325 MG tablet Take 1 tablet by mouth every 6 (six) hours as needed for moderate pain.     hydrOXYzine (VISTARIL) 25 MG capsule TAKE 1 CAPSULE (25 MG TOTAL) BY MOUTH EVERY 8 (EIGHT) HOURS AS NEEDED FOR ANXIETY. 270 capsule 1   icosapent Ethyl (VASCEPA) 1 g capsule Take 2 capsules (2 g total) by mouth 2 (two) times daily. 120 capsule 2   insulin degludec (TRESIBA FLEXTOUCH) 200 UNIT/ML FlexTouch Pen Inject 56 Units into the skin every evening. 27 mL 0   Insulin Pen Needle 31G X 8 MM MISC Inject 1 Units into the skin daily.     omeprazole (PRILOSEC) 40 MG capsule Take 1 capsule (40 mg total) by mouth in the morning and at bedtime. 180 capsule 0   ondansetron (ZOFRAN-ODT) 4 MG disintegrating tablet Take by mouth.     oxybutynin (DITROPAN-XL) 10 MG 24 hr tablet Take 10 mg by mouth daily.     pregabalin (LYRICA) 100 MG capsule 1 am, one at noon, 2 qhs 360 capsule 1   rivaroxaban (XARELTO) 20 MG TABS tablet Take by mouth at bedtime.     rosuvastatin (CRESTOR) 40 MG tablet Take 1 tablet (40 mg total) by mouth daily. 90 tablet 0    Semaglutide,0.25 or 0.5MG/DOS, 2 MG/1.5ML SOPN Inject 1 mg into the skin every Monday. 6 mL 0   valACYclovir (VALTREX) 1000 MG tablet Take by mouth.     valsartan (DIOVAN) 320 MG tablet Take 1 tablet (320 mg total) by mouth daily. 90 tablet 3   VITAMIN D-VITAMIN K PO Take 1 tablet by mouth every evening.     No facility-administered medications prior to visit.    Allergies  Allergen Reactions   Codeine Nausea Only    Review of Systems  Constitutional:  Negative for appetite change, fatigue and fever.  HENT:  Positive for ear pain and sore throat. Negative for congestion and sinus pressure.   Respiratory:  Positive for cough. Negative for chest tightness, shortness of breath and wheezing.   Cardiovascular:  Negative for chest pain and palpitations.  Gastrointestinal:  Negative for abdominal pain, constipation, diarrhea, nausea and vomiting.  Genitourinary:  Negative for dysuria and hematuria.  Musculoskeletal:  Negative for arthralgias, back pain, joint swelling and myalgias.  Skin:  Negative for rash.  Neurological:  Positive for dizziness. Negative for weakness and headaches.  Psychiatric/Behavioral:  Negative for dysphoric mood. The patient is not nervous/anxious.        Objective:    Physical Exam Vitals reviewed.  Constitutional:      Appearance: Normal appearance. She is normal weight.  HENT:     Right Ear: There is impacted cerumen.     Left Ear: Tympanic membrane, ear canal and external ear normal. There is no impacted cerumen.     Nose: No congestion or rhinorrhea.     Comments: Tender over bridge of nose. No bruising.     Mouth/Throat:     Pharynx: Posterior oropharyngeal erythema (3+ tonsils BL. Difficult to visualize.) present.  Eyes:     Pupils: Pupils are equal, round, and reactive to light.  Cardiovascular:     Rate and Rhythm: Normal rate and regular  rhythm.     Heart sounds: Normal heart sounds.  Pulmonary:     Effort: Pulmonary effort is normal.      Breath sounds: Normal breath sounds.  Abdominal:     General: Abdomen is flat. Bowel sounds are normal.     Palpations: Abdomen is soft.     Tenderness: There is no abdominal tenderness.  Musculoskeletal:     Cervical back: Edema and tenderness (rt side of neck.) present. No muscular tenderness.  Skin:    Findings: Bruising (bruise on rt upper arm. No bruising on her fae.) and rash (shingles drying up. no vesicles. rt buttock) present.  Neurological:     General: No focal deficit present.     Mental Status: She is alert.     Motor: No weakness.     Comments: Oriented to person, place, and time, but confused about events of the last few days. Patient is very shaky.  Strength / BL UE and LE. No nystagmus . Sensation equal bilaterally on face.    Psychiatric:     Comments: Judgment abnormal.  Forgetful. Emotionally labile.  Crying.     BP (!) 176/118 (BP Location: Left Arm, Patient Position: Sitting)   Pulse 96   Temp (!) 97.5 F (36.4 C) (Temporal)   Ht 5' 3"$  (1.6 m)   Wt 213 lb (96.6 kg)   SpO2 96%   BMI 37.73 kg/m  Wt Readings from Last 3 Encounters:  10/10/22 213 lb (96.6 kg)  07/29/22 270 lb (122.5 kg)  07/15/22 209 lb (94.8 kg)    Health Maintenance Due  Topic Date Due   Zoster Vaccines- Shingrix (1 of 2) Never done   OPHTHALMOLOGY EXAM  12/08/2021   COVID-19 Vaccine (4 - 2023-24 season) 04/19/2022   FOOT EXAM  10/05/2022    There are no preventive care reminders to display for this patient.   Lab Results  Component Value Date   TSH 3.630 07/15/2022   Lab Results  Component Value Date   WBC 8.2 07/15/2022   HGB 13.6 07/15/2022   HCT 42.1 07/15/2022   MCV 88 07/15/2022   PLT 303 07/15/2022   Lab Results  Component Value Date   NA 137 07/15/2022   K 4.9 07/15/2022   CO2 25 07/15/2022   GLUCOSE 342 (H) 07/15/2022   BUN 22 07/15/2022   CREATININE 1.22 (H) 07/15/2022   BILITOT <0.2 07/15/2022   ALKPHOS 106 07/15/2022   AST 12 07/15/2022   ALT 16  07/15/2022   PROT 7.0 07/15/2022   ALBUMIN 4.5 07/15/2022   CALCIUM 10.4 (H) 07/15/2022   ANIONGAP 14 08/10/2019   EGFR 47 (L) 07/15/2022   Lab Results  Component Value Date   CHOL 253 (H) 07/15/2022   Lab Results  Component Value Date   HDL 42 07/15/2022   Lab Results  Component Value Date   LDLCALC 151 (H) 07/15/2022   Lab Results  Component Value Date   TRIG 324 (H) 07/15/2022   Lab Results  Component Value Date   CHOLHDL 6.0 (H) 07/15/2022   Lab Results  Component Value Date   HGBA1C 7.8 (H) 07/15/2022         Assessment & Plan:   Patient is a 77 year old white female with past medical history significant for hypertension, diabetes, hyperlipidemia, atrial fibrillation who fell yesterday and hit her head.  Patient needs CT scan to rule out a bleed.  Patient is also having increased confusion and dizziness.  This is complicated by  the fact that she was recently in the emergency department for tonsillitis and given Augmentin and hydrocodone.  She has continued to have swelling and pain of her throat.  She has been taking the hydrocodone 4 times a day regularly as she was instructed she thought.  Not just as needed.  This certainly could be contributing to her current state.  In addition her blood pressure and her sugars have been very high.  Elevated sugars may be due to the Decadron she received on Sunday but her hypertension is also uncontrolled. I again believe needs to be ruled out and then I anticipate blood pressure medications will need to be adjusted and possibly diabetic medicines.  Sent by EMS to [facility].  Her nephew Shanon Brow was contacted. His number is [phone].  Problem List Items Addressed This Visit       Cardiovascular and Mediastinum   Hypertension associated with diabetes ([place]) (Chronic)     Respiratory   Tonsillitis   Relevant Medications   valACYclovir (VALTREX) 1000 MG tablet     Endocrine   Diabetic peripheral neuropathy  associated with type 2 diabetes mellitus (HCC)     Hematopoietic and Hemostatic   Acquired thrombophilia (HCC)     Other   Dizziness and giddiness   Relevant Medications   ondansetron (ZOFRAN-ODT) 4 MG disintegrating tablet   Facial contusion - Primary   Finger pain, left   Fall   Shingles rash   Relevant Medications   valACYclovir (VALTREX) 1000 MG tablet       I,Lauren M Auman,acting as a scribe for Rochel Brome, MD.,have documented all relevant documentation on the behalf of Rochel Brome, MD,as directed by  Rochel Brome, MD while in the presence of Rochel Brome, MD.   Rochel Brome, MD

## 2022-10-12 ENCOUNTER — Other Ambulatory Visit: Payer: Self-pay | Admitting: Nurse Practitioner

## 2022-10-12 DIAGNOSIS — B0229 Other postherpetic nervous system involvement: Secondary | ICD-10-CM | POA: Insufficient documentation

## 2022-10-14 ENCOUNTER — Telehealth: Payer: Self-pay

## 2022-10-14 NOTE — Transitions of Care (Post Inpatient/ED Visit) (Signed)
   10/14/2022  Name: Tanicia Weatherwax MRN: [id number] DOB: 1945-12-17  Today's TOC FU Call Status: Today's TOC FU Call Status:: Successful TOC FU Call Competed TOC FU Call Complete Date: 10/14/22  Transition Care Management Follow-up Telephone Call Date of Discharge: 10/12/22 Discharge Facility: Other ([facility]) Name of Other (Non[facility]) Discharge Facility: [facility] Type of Discharge: Inpatient Admission Primary Inpatient Discharge Diagnosis:: disease of pharynx How have you been since you were released from the hospital?: Better Any questions or concerns?: No  Items Reviewed: Did you receive and understand the discharge instructions provided?: Yes Medications obtained and verified?: Yes (Medications Reviewed) Any new allergies since your discharge?: No Dietary orders reviewed?: Yes Do you have support at home?: Yes People in Home: spouse  Home Care and Equipment/Supplies: [place] Ordered?: Yes Name of [place]:: [place] Has Agency set up a time to come to your home?: Yes [place] Visit Date: 10/14/22 Any new equipment or medical supplies ordered?: NA  Functional Questionnaire: Do you need assistance with bathing/showering or dressing?: No Do you need assistance with meal preparation?: No Do you need assistance with eating?: No Do you have difficulty maintaining continence: No Do you need assistance with getting out of bed/getting out of a chair/moving?: No Do you have difficulty managing or taking your medications?: No  Folllow up appointments reviewed:      Jaquelyn Bitter, [place] [phone]

## 2022-10-22 ENCOUNTER — Ambulatory Visit (INDEPENDENT_AMBULATORY_CARE_PROVIDER_SITE_OTHER): Payer: Medicare Other | Admitting: Family Medicine

## 2022-10-22 VITALS — BP 132/62 | HR 69 | Temp 97.5°F | Ht 63.0 in | Wt 214.0 lb

## 2022-10-22 DIAGNOSIS — H81393 Other peripheral vertigo, bilateral: Secondary | ICD-10-CM | POA: Diagnosis not present

## 2022-10-22 DIAGNOSIS — E1159 Type 2 diabetes mellitus with other circulatory complications: Secondary | ICD-10-CM

## 2022-10-22 DIAGNOSIS — I152 Hypertension secondary to endocrine disorders: Secondary | ICD-10-CM

## 2022-10-22 DIAGNOSIS — E1140 Type 2 diabetes mellitus with diabetic neuropathy, unspecified: Secondary | ICD-10-CM

## 2022-10-22 DIAGNOSIS — J029 Acute pharyngitis, unspecified: Secondary | ICD-10-CM

## 2022-10-22 DIAGNOSIS — J392 Other diseases of pharynx: Secondary | ICD-10-CM

## 2022-10-22 DIAGNOSIS — Z794 Long term (current) use of insulin: Secondary | ICD-10-CM

## 2022-10-22 MED ORDER — SEMAGLUTIDE (1 MG/DOSE) 4 MG/3ML ~~LOC~~ SOPN: 1.0000 mg | PEN_INJECTOR | SUBCUTANEOUS | 2 refills | Status: DC

## 2022-10-22 NOTE — Progress Notes (Signed)
Subjective:  Patient ID: Caitlin [name], female    DOB: Mar 01, 1946  Age: 77 y.o. MRN: [id number]  Chief Complaint  Patient presents with   Hospitalization Follow-up    HPI Hospital follow up: patient is doing much better. No further dizziness. Bp good. Throat is no longer sore, but still a little hoarse.  Admitted:10/10/2022 Discharged: 10/12/2022  Discharge Diagnoses: Herpes zoster with nervous system complication Impaired functional mobility, balance, gait, and endurance Pharyngitis and Pharyngeal edema Uncontrolled diabetes Hypertension Severe vertigo with fall  Discharge medications: Decadron, valtrex, and augmentin. Held ARB for possible angioedema.   Follow ups made with me, as well as [facility] hospitalist performed 2 home visits following discharge.  Ambulatory Referral To Adult Physical Therapy for Vestibular Therapy  Summary of hospitalization below.  Pharyngitis with edema: admitted due to failure on outpatient course of antibiotics. She presented with worsening pain and difficulty swallowing. Patient was unable to swallow her pills, eat, or drink.   ARB discontinued due to possible angioedema. I Given IV Unasyn and 6 mg Decadron IV for treatment of possible bacterial pharyngitis with edema. Discharged on augmentin and dexamethasone. VZV or HSV possible as she had one ulcer in her mouth: Swabs for PCRs were obtained from the oral ulceration and came back negative after discharge. Discharged on valtrex.  State on discharge:  significant improvement in swallowing and vocal quality. She was tolerating regular diet.  Intractable peripheral vertigo: Severe vertigo on admission with resulting fall. Trauma scans in the ED were negative.  Medications: given meclizine. [facility] Kitchen MRI brain was obtained to rule out central vertigo and vestibular neuritis in the setting of possible VZV outbreak and results were negative. There was a mastoid effusion noted.  Conclusion was likely had bacterial  otitis media/labyrinthitis as the cause of her vertigo.  Discharge Medications:: Augmenting and meclizine.  Referral: outpatient physical therapy. Shingles: Right buttock treated with valtrex. It is possible that the shingles may have been oropharyngeal as well. It is felt less likely that this was impacting her vertigo she does not have signs of neuritis on imaging or ear canal lesions consistent with shingles.  T2DM: hyperglycemia due to steroids. Discharged on ozempic and insulin.   HYPERTENSION: Held ARB (VALSARTAN.) Persistent hypertension resulted in starting hydralazine which was transitioned to labetalol 300 mg twice daily.       10/22/2022   11:03 AM 04/04/2022   11:30 AM 02/26/2022   11:17 AM 10/05/2021    8:33 AM 08/21/2021    3:33 PM  Depression screen PHQ 2/9  Decreased Interest 0 0 0 1 1  Down, Depressed, Hopeless 0 0 0 1 3  PHQ - 2 Score 0 0 0 2 4  Altered sleeping 1   1 1   Tired, decreased energy 1   1 1   Change in appetite 0   1 1  Feeling bad or failure about yourself  0   1 2  Trouble concentrating 0   1 2  Moving slowly or fidgety/restless 0   1 2  Suicidal thoughts 0   1 0  PHQ-9 Score 2   9 13   Difficult doing work/chores Not difficult at all   Somewhat difficult Somewhat difficult         07/03/2021    9:13 AM 10/05/2021    8:31 AM 02/26/2022   11:17 AM 04/04/2022   11:34 AM 10/22/2022   11:02 AM  [place] in the past year? 1 0 1 1 1   Was there  an injury with Fall? 0 0 1 1 0  Fall Risk Category Calculator 2 0 2 2 2   Fall Risk Category (Retired) Moderate Low Moderate Moderate   (RETIRED) Patient Fall Risk Level Low fall risk  Moderate fall risk Moderate fall risk   Patient at Risk for Falls Due to No Fall Risks  No Fall Risks No Fall Risks No Fall Risks  Fall risk Follow up Falls evaluation completed Falls evaluation completed Falls evaluation completed Falls evaluation completed;Education provided Falls evaluation completed      Review of Systems   Constitutional:  Negative for chills, fatigue and fever.  HENT:  Negative for congestion, ear pain, rhinorrhea and sore throat.   Respiratory:  Negative for cough and shortness of breath.   Cardiovascular:  Negative for chest pain.  Gastrointestinal:  Negative for abdominal pain, constipation, diarrhea, nausea and vomiting.  Genitourinary:  Negative for dysuria and urgency.  Musculoskeletal:  Negative for back pain and myalgias.  Neurological:  Negative for dizziness, weakness, light-headedness and headaches.  Psychiatric/Behavioral:  Negative for dysphoric mood. The patient is not nervous/anxious.     Current Outpatient Medications on File Prior to Visit  Medication Sig Dispense Refill   labetalol (NORMODYNE) 300 MG tablet Take 300 mg by mouth 2 (two) times daily.     albuterol (PROVENTIL) (2.5 MG/3ML) 0.083% nebulizer solution Take 3 mLs (2.5 mg total) by nebulization every 6 (six) hours as needed for wheezing or shortness of breath. 75 mL 0   albuterol (VENTOLIN HFA) 108 (90 Base) MCG/ACT inhaler Inhale 2 puffs into the lungs every 6 (six) hours as needed for wheezing or shortness of breath. 8 g 3   Apple Cid Vn-Grn Tea-Bit Or-Cr (APPLE CIDER VINEGAR PLUS PO) Take by mouth.     BD PEN NEEDLE NANO 2ND GEN 32G X 4 MM MISC 1 EACH BY DOES NOT APPLY ROUTE IN THE MORNING AND AT BEDTIME. 100 each 2   Budeson-Glycopyrrol-Formoterol (BREZTRI AEROSPHERE) 160-9-4.8 MCG/ACT AERO Inhale 2 puffs into the lungs in the morning and at bedtime. 10.7 g 3   buPROPion (WELLBUTRIN XL) 300 MG 24 hr tablet Once daily in am. 90 tablet 1   calcium carbonate (TUMS - DOSED IN MG ELEMENTAL CALCIUM) 500 MG chewable tablet Chew 2 tablets by mouth daily as needed for indigestion.      Continuous Blood Gluc Receiver (FREESTYLE LIBRE 14 DAY READER) DEVI USE TO CHECK BLOOD SUGARS DAILY     Continuous Blood Gluc Sensor (FREESTYLE LIBRE 14 DAY SENSOR) MISC SMARTSIG:1 Topical Every 2 Weeks     diltiazem (CARDIZEM CD) 300 MG  24 hr capsule Take 300 mg by mouth daily.     fexofenadine (ALLEGRA) 180 MG tablet Take 180 mg by mouth daily as needed for allergies or rhinitis.     furosemide (LASIX) 80 MG tablet Take by mouth.     hydrOXYzine (VISTARIL) 25 MG capsule TAKE 1 CAPSULE (25 MG TOTAL) BY MOUTH EVERY 8 (EIGHT) HOURS AS NEEDED FOR ANXIETY. 270 capsule 1   icosapent Ethyl (VASCEPA) 1 g capsule Take 2 capsules (2 g total) by mouth 2 (two) times daily. 120 capsule 2   insulin degludec (TRESIBA FLEXTOUCH) 200 UNIT/ML FlexTouch Pen Inject 56 Units into the skin every evening. 27 mL 0   Insulin Pen Needle 31G X 8 MM MISC Inject 1 Units into the skin daily.     omeprazole (PRILOSEC) 40 MG capsule Take 1 capsule (40 mg total) by mouth in the morning and at  bedtime. 180 capsule 0   ondansetron (ZOFRAN-ODT) 4 MG disintegrating tablet Take by mouth.     oxybutynin (DITROPAN-XL) 10 MG 24 hr tablet Take 10 mg by mouth daily.     pregabalin (LYRICA) 100 MG capsule 1 am, one at noon, 2 qhs 360 capsule 1   rivaroxaban (XARELTO) 20 MG TABS tablet Take by mouth at bedtime.     rosuvastatin (CRESTOR) 40 MG tablet Take 1 tablet (40 mg total) by mouth daily. 90 tablet 0   VITAMIN D-VITAMIN K PO Take 1 tablet by mouth every evening.     No current facility-administered medications on file prior to visit.   Past Medical History:  Diagnosis Date   Arthritis    Asthma    Atrial fibrillation (HCC)    Cauda equina syndrome ([place]) 09/20/5425   Complication of anesthesia    COPD (chronic obstructive pulmonary disease) (HCC)    Depression    Diabetes mellitus without complication (HCC)    Type II   Diabetic neuropathy, type II diabetes mellitus ([place]) 12/13/2019   Dysrhythmia    GERD (gastroesophageal reflux disease)    HTN (hypertension)    Hypercholesteremia    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[place]) 12/13/2019   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[place]) 12/13/2019   Morbid obesity ([place])  12/13/2019   Obstructive sleep apnea    PONV (postoperative nausea and vomiting)    "not the last few times"   RLS (restless legs syndrome)    S/P ablation of atrial flutter 02/12/2016   S/P lumbar fusion 08/04/2019   Sleep apnea    Upper respiratory tract infection due to COVID-19 virus 08/14/2021   Past Surgical History:  Procedure Laterality Date   ATRIAL FIBRILLATION ABLATION     x3   CATARACT EXTRACTION Bilateral    COLONOSCOPY  01/04/2013   Moderate predominantly sigmoid diverticulosis. Small internal hemorrhoids. Otherwise normal colonosopy.    LUMBAR WOUND DEBRIDEMENT N/A 08/09/2019   Procedure: LUMBAR WOUND DEBRIDEMENT Evacuation of  EPIDURAL HEMATOMA;  Surgeon: Newman Pies, MD;  Location: [facility];  Service: Neurosurgery;  Laterality: N/A;    Family History  Problem Relation Age of Onset   Breast cancer Mother 3   Heart attack Father 46   Mental illness Sister    Heart Problems Brother 37   Cancer Brother    Breast cancer Paternal Grandmother    Colon cancer Paternal Aunt 81   Social History   Socioeconomic History   Marital status: Married    Spouse name: Shanon Brow   Number of children: Not on file   Years of education: Not on file   Highest education level: Not on file  Occupational History   Occupation: Retired Engineer, building services  Tobacco Use   Smoking status: Never    Passive exposure: Past   Smokeless tobacco: Never  Vaping Use   Vaping Use: Never used  Substance and Sexual Activity   Alcohol use: Not Currently   Drug use: Never   Sexual activity: Not Currently  Other Topics Concern   Not on file  Social History Narrative   Not on file   Social Determinants of Health   Financial Resource Strain: Low Risk  (04/04/2022)   Overall Financial Resource Strain (CARDIA)    Difficulty of Paying Living Expenses: Not hard at all  Food Insecurity: No Food Insecurity (04/04/2022)   Hunger Vital Sign    Worried About Running Out of Food in the Last Year: Never  true  Ran Out of Food in the Last Year: Never true  Transportation Needs: No Transportation Needs (04/04/2022)   PRAPARE - Hydrologist (Medical): No    Lack of Transportation (Non-Medical): No  Physical Activity: Inactive (04/04/2022)   Exercise Vital Sign    Days of Exercise per Week: 0 days    Minutes of Exercise per Session: 0 min  Stress: No Stress Concern Present (04/04/2022)   [place]    Feeling of Stress : Not at all  Social Connections: Not on file    Objective:  BP 132/62   Pulse 69   Temp (!) 97.5 F (36.4 C)   Ht 5\' 3"  (1.6 m)   Wt 214 lb (97.1 kg)   SpO2 98%   BMI 37.91 kg/m      10/22/2022   10:58 AM 10/10/2022    9:47 AM 07/29/2022    1:56 PM  BP/Weight  Systolic BP 382 505 397  Diastolic BP 62 673 80  Wt. (Lbs) 214 213 270  BMI 37.91 kg/m2 37.73 kg/m2 47.83 kg/m2    Physical Exam Vitals reviewed.  Constitutional:      Appearance: Normal appearance. She is obese.  Neck:     Vascular: No carotid bruit.  Cardiovascular:     Rate and Rhythm: Normal rate and regular rhythm.     Heart sounds: Normal heart sounds.  Pulmonary:     Effort: Pulmonary effort is normal. No respiratory distress.     Breath sounds: Normal breath sounds.  Abdominal:     General: Abdomen is flat. Bowel sounds are normal.     Palpations: Abdomen is soft.     Tenderness: There is no abdominal tenderness.  Neurological:     Mental Status: She is alert and oriented to person, place, and time.  Psychiatric:        Mood and Affect: Mood normal.        Behavior: Behavior normal.     Diabetic Foot Exam - Simple   No data filed      Lab Results  Component Value Date   WBC 14.2 (H) 10/22/2022   HGB 12.6 10/22/2022   HCT 38.9 10/22/2022   PLT 267 10/22/2022   GLUCOSE 310 (H) 10/22/2022   CHOL 253 (H) 07/15/2022   TRIG 324 (H) 07/15/2022   HDL 42 07/15/2022   LDLCALC 151 (H)  07/15/2022   ALT 41 (H) 10/22/2022   AST 22 10/22/2022   NA 142 10/22/2022   K 4.3 10/22/2022   CL 99 10/22/2022   CREATININE 0.83 10/22/2022   BUN 14 10/22/2022   CO2 21 10/22/2022   TSH 3.630 07/15/2022   INR 0.9 08/04/2019   HGBA1C 7.8 (H) 07/15/2022      Assessment & Plan:    Hypertension associated with diabetes ([place]) Assessment & Plan: Well controlled.  No changes to medicines. Continue with Labetalol 300 mg twice daily. Continue to work on eating a healthy diet and exercise.  Labs drawn today.    Orders: -     CBC with Differential/Platelet -     Comprehensive metabolic panel  Pharyngeal edema Assessment & Plan: Resolved.   Type 2 diabetes mellitus with diabetic neuropathy, with long-term current use of insulin ([place]) Assessment & Plan: Improving. The current medical regimen is effective;  continue present plan and medications.   Orders: -     Semaglutide (1 MG/DOSE); Inject 1 mg as directed once  a week.  Dispense: 3 mL; Refill: 2  Peripheral vertigo of both ears Assessment & Plan: Improved. Continue physical therapy    Acute pharyngitis, unspecified etiology Assessment & Plan: Resolved.       Meds ordered this encounter  Medications   Semaglutide, 1 MG/DOSE, 4 MG/3ML SOPN    Sig: Inject 1 mg as directed once a week.    Dispense:  3 mL    Refill:  2    Orders Placed This Encounter  Procedures   CBC with Differential/Platelet   Comprehensive metabolic panel     Follow-up: Return in about 6 weeks (around 12/03/2022) for chronic fasting.  I,Marla I Leal-Borjas,acting as a scribe for Rochel Brome, MD.,have documented all relevant documentation on the behalf of Rochel Brome, MD,as directed by  Rochel Brome, MD while in the presence of Rochel Brome, MD.     An After Visit Summary was printed and given to the patient.  I attest that I have reviewed this visit and agree with the plan scribed by my staff.   Rochel Brome, MD [facility] [phone]

## 2022-10-23 LAB — COMPREHENSIVE METABOLIC PANEL
ALT: 41 IU/L — ABNORMAL HIGH (ref 0–32)
AST: 22 IU/L (ref 0–40)
Albumin/Globulin Ratio: 1.6 (ref 1.2–2.2)
Albumin: 3.6 g/dL — ABNORMAL LOW (ref 3.8–4.8)
Alkaline Phosphatase: 79 IU/L (ref 44–121)
BUN/Creatinine Ratio: 17 (ref 12–28)
BUN: 14 mg/dL (ref 8–27)
Bilirubin Total: 0.3 mg/dL (ref 0.0–1.2)
CO2: 21 mmol/L (ref 20–29)
Calcium: 8.9 mg/dL (ref 8.7–10.3)
Chloride: 99 mmol/L (ref 96–106)
Creatinine, Ser: 0.83 mg/dL (ref 0.57–1.00)
Globulin, Total: 2.3 g/dL (ref 1.5–4.5)
Glucose: 310 mg/dL — ABNORMAL HIGH (ref 70–99)
Potassium: 4.3 mmol/L (ref 3.5–5.2)
Sodium: 142 mmol/L (ref 134–144)
Total Protein: 5.9 g/dL — ABNORMAL LOW (ref 6.0–8.5)
eGFR: 73 mL/min/{1.73_m2} (ref >59)

## 2022-10-23 LAB — CBC WITH DIFFERENTIAL/PLATELET
Basophils Absolute: 0 10*3/uL (ref 0.0–0.2)
Basos: 0 %
EOS (ABSOLUTE): 0.2 10*3/uL (ref 0.0–0.4)
Eos: 1 %
Hematocrit: 38.9 % (ref 34.0–46.6)
Hemoglobin: 12.6 g/dL (ref 11.1–15.9)
Immature Grans (Abs): 0.1 10*3/uL (ref 0.0–0.1)
Immature Granulocytes: 1 %
Lymphocytes Absolute: 2.8 10*3/uL (ref 0.7–3.1)
Lymphs: 20 %
MCH: 29.2 pg (ref 26.6–33.0)
MCHC: 32.4 g/dL (ref 31.5–35.7)
MCV: 90 fL (ref 79–97)
Monocytes Absolute: 1 10*3/uL — ABNORMAL HIGH (ref 0.1–0.9)
Monocytes: 7 %
Neutrophils Absolute: 10.1 10*3/uL — ABNORMAL HIGH (ref 1.4–7.0)
Neutrophils: 71 %
Platelets: 267 10*3/uL (ref 150–450)
RBC: 4.32 x10E6/uL (ref 3.77–5.28)
RDW: 16 % — ABNORMAL HIGH (ref 11.7–15.4)
WBC: 14.2 10*3/uL — ABNORMAL HIGH (ref 3.4–10.8)

## 2022-10-26 NOTE — Assessment & Plan Note (Addendum)
Well controlled.  No changes to medicines. Continue with Labetalol 300 mg twice daily. Continue to work on eating a healthy diet and exercise.  Labs drawn today.

## 2022-10-30 ENCOUNTER — Ambulatory Visit: Payer: Medicare Other | Admitting: Family Medicine

## 2022-11-03 ENCOUNTER — Encounter: Payer: Self-pay | Admitting: Family Medicine

## 2022-11-03 DIAGNOSIS — H81399 Other peripheral vertigo, unspecified ear: Secondary | ICD-10-CM | POA: Insufficient documentation

## 2022-11-03 DIAGNOSIS — Z7409 Other reduced mobility: Secondary | ICD-10-CM | POA: Insufficient documentation

## 2022-11-03 DIAGNOSIS — J029 Acute pharyngitis, unspecified: Secondary | ICD-10-CM | POA: Insufficient documentation

## 2022-11-03 NOTE — Assessment & Plan Note (Signed)
Improved.    Continue physical therapy

## 2022-11-03 NOTE — Assessment & Plan Note (Signed)
Resolved

## 2022-11-03 NOTE — Assessment & Plan Note (Signed)
Improving. The current medical regimen is effective;  continue present plan and medications.

## 2022-11-06 ENCOUNTER — Ambulatory Visit: Payer: Medicare Other | Admitting: Family Medicine

## 2022-11-06 ENCOUNTER — Inpatient Hospital Stay: Payer: Medicare Other | Admitting: Family Medicine

## 2022-11-06 VITALS — BP 128/82 | HR 80 | Temp 97.5°F | Ht 63.0 in | Wt 209.0 lb

## 2022-11-06 DIAGNOSIS — G4733 Obstructive sleep apnea (adult) (pediatric): Secondary | ICD-10-CM | POA: Diagnosis not present

## 2022-11-06 DIAGNOSIS — I480 Paroxysmal atrial fibrillation: Secondary | ICD-10-CM

## 2022-11-06 DIAGNOSIS — E1159 Type 2 diabetes mellitus with other circulatory complications: Secondary | ICD-10-CM

## 2022-11-06 DIAGNOSIS — E1169 Type 2 diabetes mellitus with other specified complication: Secondary | ICD-10-CM

## 2022-11-06 DIAGNOSIS — H6123 Impacted cerumen, bilateral: Secondary | ICD-10-CM

## 2022-11-06 DIAGNOSIS — E1140 Type 2 diabetes mellitus with diabetic neuropathy, unspecified: Secondary | ICD-10-CM

## 2022-11-06 DIAGNOSIS — E785 Hyperlipidemia, unspecified: Secondary | ICD-10-CM

## 2022-11-06 DIAGNOSIS — Z794 Long term (current) use of insulin: Secondary | ICD-10-CM

## 2022-11-06 DIAGNOSIS — I152 Hypertension secondary to endocrine disorders: Secondary | ICD-10-CM

## 2022-11-06 DIAGNOSIS — E66812 Obesity, class 2: Secondary | ICD-10-CM

## 2022-11-06 DIAGNOSIS — D6869 Other thrombophilia: Secondary | ICD-10-CM | POA: Diagnosis not present

## 2022-11-06 DIAGNOSIS — I700 Atherosclerosis of aorta: Secondary | ICD-10-CM

## 2022-11-06 DIAGNOSIS — J4540 Moderate persistent asthma, uncomplicated: Secondary | ICD-10-CM

## 2022-11-06 DIAGNOSIS — Z6837 Body mass index (BMI) 37.0-37.9, adult: Secondary | ICD-10-CM

## 2022-11-06 MED ORDER — BREZTRI AEROSPHERE 160-9-4.8 MCG/ACT IN AERO: 2.0000 | INHALATION_SPRAY | Freq: Two times a day (BID) | RESPIRATORY_TRACT | 3 refills | Status: DC

## 2022-11-06 MED ORDER — ALBUTEROL SULFATE HFA 108 (90 BASE) MCG/ACT IN AERS: 2.0000 | INHALATION_SPRAY | Freq: Four times a day (QID) | RESPIRATORY_TRACT | 3 refills | Status: DC | PRN

## 2022-11-06 NOTE — Progress Notes (Signed)
Subjective:  Patient ID: Caitlin [name], female    DOB: Jul 17, 1946  Age: 77 y.o. MRN: [id number]  Chief Complaint  Patient presents with   Hospitalization Follow-up   HPI Patient presents today for hospital follow up admitted to [facility] on 10/25/2022 and discharged 10/30/2022. EMS brought her to ED and she was very confused. Was treated with IV abx for 5 days and discharged on Augmentin 875 mg BID and Coreg 6.25 BID. Treated for Pneumonia and Sepsis. Home PT was set up by [facility] and is working with patient on vertigo. Continued sore throat although has improved.  Still completing augmentin.   Diabetes:  Complications: neuropathy Glucose checking: CGM Glucose logs:150-300 Hypoglycemia: no Most recent A1C: 6.6 Current medications: tresiba 56 U daily and ozempic 1 mg weekly. ON lyrica 100 mg one at noon and 2 at night.   Foot checks: daily  Hyperlipidemia: Current medications: crestor 40 mg daily. Vascepa 1 gm 2 capsules twice daily.   Hypertension: Complications: Current medications: Labetalol changed to coreg by cardiology. Lasix 80 mg daily.  Atrial fibrillation: on caredilol and xarelto.         10/22/2022   11:03 AM 04/04/2022   11:30 AM 02/26/2022   11:17 AM 10/05/2021    8:33 AM 08/21/2021    3:33 PM  Depression screen PHQ 2/9  Decreased Interest 0 0 0 1 1  Down, Depressed, Hopeless 0 0 0 1 3  PHQ - 2 Score 0 0 0 2 4  Altered sleeping 1   1 1   Tired, decreased energy 1   1 1   Change in appetite 0   1 1  Feeling bad or failure about yourself  0   1 2  Trouble concentrating 0   1 2  Moving slowly or fidgety/restless 0   1 2  Suicidal thoughts 0   1 0  PHQ-9 Score 2   9 13   Difficult doing work/chores Not difficult at all   Somewhat difficult Somewhat difficult         07/03/2021    9:13 AM 10/05/2021    8:31 AM 02/26/2022   11:17 AM 04/04/2022   11:34 AM 10/22/2022   11:02 AM  [place] in the past year? 1 0 1 1 1   Was there an injury with Fall? 0 0 1 1 0  Fall Risk  Category Calculator 2 0 2 2 2   Fall Risk Category (Retired) Moderate Low Moderate Moderate   (RETIRED) Patient Fall Risk Level Low fall risk  Moderate fall risk Moderate fall risk   Patient at Risk for Falls Due to No Fall Risks  No Fall Risks No Fall Risks No Fall Risks  Fall risk Follow up Falls evaluation completed Falls evaluation completed Falls evaluation completed Falls evaluation completed;Education provided Falls evaluation completed      Review of Systems  Constitutional:  Negative for chills, fatigue and fever.  HENT:  Negative for congestion, ear pain, rhinorrhea and sore throat.   Respiratory:  Positive for cough. Negative for shortness of breath.   Cardiovascular:  Negative for chest pain.  Gastrointestinal:  Positive for nausea. Negative for abdominal pain, constipation, diarrhea and vomiting.  Genitourinary:  Negative for dysuria and urgency.  Musculoskeletal:  Negative for back pain and myalgias.  Skin:  Positive for rash (On back, taking benadryl).  Neurological:  Negative for dizziness, weakness, light-headedness and headaches.  Psychiatric/Behavioral:  Negative for dysphoric mood. The patient is not nervous/anxious.     Current  Outpatient Medications on File Prior to Visit  Medication Sig Dispense Refill   carvedilol (COREG) 6.25 MG tablet Take 6.25 mg by mouth 2 (two) times daily.     albuterol (PROVENTIL) (2.5 MG/3ML) 0.083% nebulizer solution Take 3 mLs (2.5 mg total) by nebulization every 6 (six) hours as needed for wheezing or shortness of breath. 75 mL 0   Apple Cid Vn-Grn Tea-Bit Or-Cr (APPLE CIDER VINEGAR PLUS PO) Take by mouth.     BD PEN NEEDLE NANO 2ND GEN 32G X 4 MM MISC 1 EACH BY DOES NOT APPLY ROUTE IN THE MORNING AND AT BEDTIME. 100 each 2   buPROPion (WELLBUTRIN XL) 300 MG 24 hr tablet Once daily in am. 90 tablet 1   calcium carbonate (TUMS - DOSED IN MG ELEMENTAL CALCIUM) 500 MG chewable tablet Chew 2 tablets by mouth daily as needed for indigestion.       Continuous Blood Gluc Receiver (FREESTYLE LIBRE 14 DAY READER) DEVI USE TO CHECK BLOOD SUGARS DAILY     Continuous Blood Gluc Sensor (FREESTYLE LIBRE 14 DAY SENSOR) MISC SMARTSIG:1 Topical Every 2 Weeks     fexofenadine (ALLEGRA) 180 MG tablet Take 180 mg by mouth daily as needed for allergies or rhinitis.     furosemide (LASIX) 80 MG tablet Take by mouth.     hydrOXYzine (VISTARIL) 25 MG capsule TAKE 1 CAPSULE (25 MG TOTAL) BY MOUTH EVERY 8 (EIGHT) HOURS AS NEEDED FOR ANXIETY. 270 capsule 1   icosapent Ethyl (VASCEPA) 1 g capsule Take 2 capsules (2 g total) by mouth 2 (two) times daily. 120 capsule 2   insulin degludec (TRESIBA FLEXTOUCH) 200 UNIT/ML FlexTouch Pen Inject 56 Units into the skin every evening. 27 mL 0   Insulin Pen Needle 31G X 8 MM MISC Inject 1 Units into the skin daily.     labetalol (NORMODYNE) 300 MG tablet Take 300 mg by mouth 2 (two) times daily. (Patient not taking: Reported on 11/06/2022)     omeprazole (PRILOSEC) 40 MG capsule Take 1 capsule (40 mg total) by mouth in the morning and at bedtime. 180 capsule 0   ondansetron (ZOFRAN-ODT) 4 MG disintegrating tablet Take by mouth.     oxybutynin (DITROPAN-XL) 10 MG 24 hr tablet Take 10 mg by mouth daily.     pregabalin (LYRICA) 100 MG capsule 1 am, one at noon, 2 qhs 360 capsule 1   rivaroxaban (XARELTO) 20 MG TABS tablet Take by mouth at bedtime.     rosuvastatin (CRESTOR) 40 MG tablet Take 1 tablet (40 mg total) by mouth daily. 90 tablet 0   Semaglutide, 1 MG/DOSE, 4 MG/3ML SOPN Inject 1 mg as directed once a week. 3 mL 2   VITAMIN D-VITAMIN K PO Take 1 tablet by mouth every evening.     No current facility-administered medications on file prior to visit.   Past Medical History:  Diagnosis Date   Arthritis    Asthma    Atrial fibrillation (HCC)    Cauda equina syndrome ([place]) [id number]   Complication of anesthesia    COPD (chronic obstructive pulmonary disease) (HCC)    Depression    Diabetes mellitus without  complication (HCC)    Type II   Diabetic neuropathy, type II diabetes mellitus ([place]) 12/13/2019   Dysrhythmia    GERD (gastroesophageal reflux disease)    HTN (hypertension)    Hypercholesteremia    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[place]) 12/13/2019   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  [place]) 12/13/2019   Morbid obesity ([place]) 12/13/2019   Obstructive sleep apnea    PONV (postoperative nausea and vomiting)    "not the last few times"   RLS (restless legs syndrome)    S/P ablation of atrial flutter 02/12/2016   S/P lumbar fusion 08/04/2019   Sleep apnea    Upper respiratory tract infection due to COVID-19 virus 08/14/2021   Past Surgical History:  Procedure Laterality Date   ATRIAL FIBRILLATION ABLATION     x3   CATARACT EXTRACTION Bilateral    COLONOSCOPY  01/04/2013   Moderate predominantly sigmoid diverticulosis. Small internal hemorrhoids. Otherwise normal colonosopy.    LUMBAR WOUND DEBRIDEMENT N/A 08/09/2019   Procedure: LUMBAR WOUND DEBRIDEMENT Evacuation of  EPIDURAL HEMATOMA;  Surgeon: Newman Pies, MD;  Location: [facility];  Service: Neurosurgery;  Laterality: N/A;    Family History  Problem Relation Age of Onset   Breast cancer Mother 76   Heart attack Father 35   Mental illness Sister    Heart Problems Brother 30   Cancer Brother    Breast cancer Paternal Grandmother    Colon cancer Paternal Aunt 70   Social History   Socioeconomic History   Marital status: Married    Spouse name: Shanon Brow   Number of children: Not on file   Years of education: Not on file   Highest education level: Not on file  Occupational History   Occupation: Retired Engineer, building services  Tobacco Use   Smoking status: Never    Passive exposure: Past   Smokeless tobacco: Never  Vaping Use   Vaping Use: Never used  Substance and Sexual Activity   Alcohol use: Not Currently   Drug use: Never   Sexual activity: Not Currently  Other Topics Concern    Not on file  Social History Narrative   Not on file   Social Determinants of Health   Financial Resource Strain: Low Risk  (04/04/2022)   Overall Financial Resource Strain (CARDIA)    Difficulty of Paying Living Expenses: Not hard at all  Food Insecurity: No Food Insecurity (04/04/2022)   Hunger Vital Sign    Worried About Running Out of Food in the Last Year: Never true    Ran Out of Food in the Last Year: Never true  Transportation Needs: No Transportation Needs (04/04/2022)   PRAPARE - Hydrologist (Medical): No    Lack of Transportation (Non-Medical): No  Physical Activity: Inactive (04/04/2022)   Exercise Vital Sign    Days of Exercise per Week: 0 days    Minutes of Exercise per Session: 0 min  Stress: No Stress Concern Present (04/04/2022)   [place]    Feeling of Stress : Not at all  Social Connections: Moderately Integrated (11/06/2022)   Social Connection and Isolation Panel [NHANES]    Frequency of Communication with Friends and Family: Twice a week    Frequency of Social Gatherings with Friends and Family: Twice a week    Attends Religious Services: More than 4 times per year    Active Member of [employer] or Organizations: No    Attends Music therapist: Never    Marital Status: Married    Objective:  BP 128/82   Pulse 80   Temp (!) 97.5 F (36.4 C)   Ht 5\' 3"  (1.6 m)   Wt 209 lb (94.8 kg)   SpO2 98%   BMI 37.02 kg/m  11/06/2022    1:28 PM 10/22/2022   10:58 AM 10/10/2022    9:47 AM  BP/Weight  Systolic BP 0000000 [id number] 0000000  Diastolic BP 82 62 123456  Wt. (Lbs) 209 214 213  BMI 37.02 kg/m2 37.91 kg/m2 37.73 kg/m2    Physical Exam Vitals reviewed.  Constitutional:      Appearance: Normal appearance.  HENT:     Right Ear: Tympanic membrane, ear canal and external ear normal. There is impacted cerumen.     Left Ear: Tympanic membrane, ear canal and external  ear normal. There is impacted cerumen.     Nose: Nose normal.     Mouth/Throat:     Pharynx: Oropharynx is clear.  Cardiovascular:     Rate and Rhythm: Normal rate and regular rhythm.     Heart sounds: Normal heart sounds. No murmur heard. Pulmonary:     Effort: Respiratory distress present.     Breath sounds: Wheezing present.  Lymphadenopathy:     Cervical: No cervical adenopathy.  Neurological:     Mental Status: She is alert and oriented to person, place, and time.  Psychiatric:        Mood and Affect: Mood normal.        Behavior: Behavior normal.     Diabetic Foot Exam - Simple   No data filed      Lab Results  Component Value Date   WBC 14.2 (H) 10/22/2022   HGB 12.6 10/22/2022   HCT 38.9 10/22/2022   PLT 267 10/22/2022   GLUCOSE 310 (H) 10/22/2022   CHOL 122 11/06/2022   TRIG 114 11/06/2022   HDL 41 11/06/2022   LDLCALC 60 11/06/2022   ALT 41 (H) 10/22/2022   AST 22 10/22/2022   NA 142 10/22/2022   K 4.3 10/22/2022   CL 99 10/22/2022   CREATININE 0.83 10/22/2022   BUN 14 10/22/2022   CO2 21 10/22/2022   TSH 3.630 07/15/2022   INR 0.9 08/04/2019   HGBA1C 9.9 (H) 11/06/2022      Assessment & Plan:    OSA (obstructive sleep apnea) Assessment & Plan: Check home sleep study.   Orders: -     Home sleep test  Type 2 diabetes mellitus with diabetic neuropathy, with long-term current use of insulin ([place]) Assessment & Plan: Control: worsened.  Continue CGM Recommend check feet daily. Recommend annual eye exams. Medicines: tresiba 56 U daily and ozempic 1 mg weekly.  Continue to work on eating a healthy diet and exercise.  Labs drawn today.     Acquired thrombophilia ([place]) Assessment & Plan: Continue xarelto due to atrial fibrillation   Hyperlipidemia associated with type 2 diabetes mellitus ([place]) Assessment & Plan: Control: worsened. Cholesterol greatly improved.  Continue CGM Recommend check feet daily. Recommend annual eye  exams. Medicines: tresiba 56 U daily and ozempic 1 mg weekly. Continue crestor 40 mg daily and Vascepa 1 gm 2 capsules twice daily.  Continue to work on eating a healthy diet and exercise.  Labs drawn today.     Orders: -     Lipid panel -     Hemoglobin A1c  Bilateral impacted cerumen Assessment & Plan: Irrigation successful BL ears.   Moderate persistent asthma without complication Assessment & Plan: The current medical regimen is effective;  continue present plan and medications. Continue breztri 2 puffs twice daily and albuterol hfa 2 puffs four times a day as needed wheezing.    Orders: -     Breztri Aerosphere; Inhale  2 puffs into the lungs in the morning and at bedtime.  Dispense: 10.7 g; Refill: 3 -     Albuterol Sulfate HFA; Inhale 2 puffs into the lungs every 6 (six) hours as needed for wheezing or shortness of breath.  Dispense: 8 g; Refill: 3  Hypertension associated with diabetes ([place]) Assessment & Plan: Continue carvedilol.   Paroxysmal atrial fibrillation [facility]) Assessment & Plan: The current medical regimen is effective;  continue present plan and medications. Continue xarelto and carvedilol. Management per specialist.     Aortic atherosclerosis [facility]) Assessment & Plan: The current medical regimen is effective;  continue present plan and medications.  Continue crestor 40 mg daily. Vascepa 1 gm 2 capsules twice daily.    Class 2 severe obesity due to excess calories with serious comorbidity and body mass index (BMI) of 37.0 to 37.9 in adult [facility]) Assessment & Plan: Recommend continue to work on eating healthy diet and exercise. Comorbidities: aortic atherosclerosis, diabetes.       Meds ordered this encounter  Medications   Budeson-Glycopyrrol-Formoterol (BREZTRI AEROSPHERE) 160-9-4.8 MCG/ACT AERO    Sig: Inhale 2 puffs into the lungs in the morning and at bedtime.    Dispense:  10.7 g    Refill:  3   albuterol (VENTOLIN HFA) 108 (90 Base)  MCG/ACT inhaler    Sig: Inhale 2 puffs into the lungs every 6 (six) hours as needed for wheezing or shortness of breath.    Dispense:  8 g    Refill:  3    Orders Placed This Encounter  Procedures   Lipid panel   Hemoglobin A1c   Home sleep test     Follow-up: Return in about 3 months (around 02/06/2023) for chronic fasting, awv IN LATE 03/2023.   I,Katherina A Bramblett,acting as a scribe for Rochel Brome, MD.,have documented all relevant documentation on the behalf of Rochel Brome, MD,as directed by  Rochel Brome, MD while in the presence of Rochel Brome, MD.   An After Visit Summary was printed and given to the patient.  I attest that I have reviewed this visit and agree with the plan scribed by my staff.   Rochel Brome, MD [facility] [phone]

## 2022-11-06 NOTE — Patient Instructions (Addendum)
RECOMMEND BREZTRI 2 PUFFS TWICE DAILY.  2.   RECOMMEND ALBUTEROL INHALERS OR NEBULIZERS USE FOUR TIMES A DAY X 48 HOURS, THEN FOUR TIMES A DAY AS NEEDED SHORTNESS OF BREATH, WHEEZING.

## 2022-11-07 ENCOUNTER — Other Ambulatory Visit: Payer: Self-pay | Admitting: Family Medicine

## 2022-11-07 LAB — LIPID PANEL
Chol/HDL Ratio: 3 ratio (ref 0.0–4.4)
Cholesterol, Total: 122 mg/dL (ref 100–199)
HDL: 41 mg/dL (ref >39)
LDL Chol Calc (NIH): 60 mg/dL (ref 0–99)
Triglycerides: 114 mg/dL (ref 0–149)
VLDL Cholesterol Cal: 21 mg/dL (ref 5–40)

## 2022-11-07 LAB — CARDIOVASCULAR RISK ASSESSMENT

## 2022-11-07 LAB — HEMOGLOBIN A1C
Est. average glucose Bld gHb Est-mCnc: 237 mg/dL
Hgb A1c MFr Bld: 9.9 % — ABNORMAL HIGH (ref 4.8–5.6)

## 2022-11-09 DIAGNOSIS — H6123 Impacted cerumen, bilateral: Secondary | ICD-10-CM | POA: Insufficient documentation

## 2022-11-09 DIAGNOSIS — E785 Hyperlipidemia, unspecified: Secondary | ICD-10-CM | POA: Insufficient documentation

## 2022-11-10 ENCOUNTER — Encounter: Payer: Self-pay | Admitting: Family Medicine

## 2022-11-10 NOTE — Assessment & Plan Note (Signed)
The current medical regimen is effective;  continue present plan and medications.  Continue crestor 40 mg daily. Vascepa 1 gm 2 capsules twice daily.

## 2022-11-10 NOTE — Assessment & Plan Note (Signed)
Control: worsened.  Continue CGM Recommend check feet daily. Recommend annual eye exams. Medicines: tresiba 56 U daily and ozempic 1 mg weekly.  Continue to work on eating a healthy diet and exercise.  Labs drawn today.

## 2022-11-10 NOTE — Assessment & Plan Note (Signed)
Continue carvedilol 

## 2022-11-10 NOTE — Assessment & Plan Note (Addendum)
The current medical regimen is effective;  continue present plan and medications. Continue breztri 2 puffs twice daily and albuterol hfa 2 puffs four times a day as needed wheezing.

## 2022-11-10 NOTE — Assessment & Plan Note (Signed)
Irrigation successful BL ears.

## 2022-11-10 NOTE — Assessment & Plan Note (Signed)
Check home sleep study. ?

## 2022-11-10 NOTE — Assessment & Plan Note (Signed)
Recommend continue to work on eating healthy diet and exercise. Comorbidities: aortic atherosclerosis, diabetes.

## 2022-11-10 NOTE — Assessment & Plan Note (Addendum)
The current medical regimen is effective;  continue present plan and medications. Continue xarelto and carvedilol. Management per specialist.

## 2022-11-10 NOTE — Assessment & Plan Note (Signed)
Continue xarelto due to atrial fibrillation °

## 2022-11-10 NOTE — Assessment & Plan Note (Addendum)
Control: worsened. Cholesterol greatly improved.  Continue CGM Recommend check feet daily. Recommend annual eye exams. Medicines: tresiba 56 U daily and ozempic 1 mg weekly. Continue crestor 40 mg daily and Vascepa 1 gm 2 capsules twice daily.  Continue to work on eating a healthy diet and exercise.  Labs drawn today.

## 2022-11-11 ENCOUNTER — Other Ambulatory Visit: Payer: Self-pay

## 2022-11-11 ENCOUNTER — Other Ambulatory Visit: Payer: Self-pay | Admitting: Family Medicine

## 2022-11-11 DIAGNOSIS — E1140 Type 2 diabetes mellitus with diabetic neuropathy, unspecified: Secondary | ICD-10-CM

## 2022-11-11 MED ORDER — SEMAGLUTIDE (1 MG/DOSE) 4 MG/3ML ~~LOC~~ SOPN: 2.0000 mg | PEN_INJECTOR | SUBCUTANEOUS | 2 refills | Status: DC

## 2022-11-11 NOTE — Progress Notes (Deleted)
This encounter was created in error - please disregard.  This encounter was created in error - please disregard.

## 2022-11-13 ENCOUNTER — Telehealth: Payer: Self-pay

## 2022-11-13 NOTE — Telephone Encounter (Signed)
Sybill from [place] called stating when she went out to see patient today she noticed that the patient's weight from last Friday was 208 and now patient is 215 so she has had a significant weight gain over the past few days and she states the patient didn't have any new complaints except what she had been complaining about which was Restless legs. She wanted to call and report this and make sure we were aware. Please advise.

## 2022-11-14 DIAGNOSIS — J159 Unspecified bacterial pneumonia: Secondary | ICD-10-CM | POA: Diagnosis not present

## 2022-11-14 DIAGNOSIS — J440 Chronic obstructive pulmonary disease with acute lower respiratory infection: Secondary | ICD-10-CM | POA: Diagnosis not present

## 2022-11-14 DIAGNOSIS — R6520 Severe sepsis without septic shock: Secondary | ICD-10-CM | POA: Diagnosis not present

## 2022-11-14 DIAGNOSIS — A419 Sepsis, unspecified organism: Secondary | ICD-10-CM | POA: Diagnosis not present

## 2022-11-14 NOTE — Telephone Encounter (Signed)
Patient's nurse informed about the patient's change in medication and was going to try to get in touch with the patient about calling us back about the medication changes because they can't tell the patient.

## 2022-11-14 NOTE — Telephone Encounter (Signed)
Left detailed message with results

## 2022-11-14 NOTE — Telephone Encounter (Signed)
Left message for [facility] health Nurse Kathaleen Bury to give [place] a call back.

## 2022-11-18 ENCOUNTER — Other Ambulatory Visit: Payer: Self-pay | Admitting: Family Medicine

## 2022-11-18 ENCOUNTER — Other Ambulatory Visit: Payer: Self-pay

## 2022-11-18 DIAGNOSIS — H6123 Impacted cerumen, bilateral: Secondary | ICD-10-CM

## 2022-11-18 DIAGNOSIS — E1140 Type 2 diabetes mellitus with diabetic neuropathy, unspecified: Secondary | ICD-10-CM

## 2022-11-18 MED ORDER — FUROSEMIDE 80 MG PO TABS: 80.0000 mg | ORAL_TABLET | Freq: Two times a day (BID) | ORAL | 0 refills | Status: DC

## 2022-11-18 NOTE — Telephone Encounter (Signed)
Patient made aware of increasing her lasix 80 mg twice daily, and lab schedule for 11/27/22 repeat CMP.  Patient stated that she is still having problems with her ear and would like to get a referral to see ENT. Please advise

## 2022-11-22 ENCOUNTER — Other Ambulatory Visit: Payer: Self-pay | Admitting: Family Medicine

## 2022-11-22 DIAGNOSIS — I152 Hypertension secondary to endocrine disorders: Secondary | ICD-10-CM

## 2022-11-26 ENCOUNTER — Other Ambulatory Visit: Payer: Self-pay | Admitting: Family Medicine

## 2022-11-26 ENCOUNTER — Telehealth: Payer: Self-pay

## 2022-11-26 DIAGNOSIS — I152 Hypertension secondary to endocrine disorders: Secondary | ICD-10-CM

## 2022-11-26 MED ORDER — OMEGA-3-ACID ETHYL ESTERS 1 G PO CAPS: 2.0000 g | ORAL_CAPSULE | Freq: Two times a day (BID) | ORAL | 0 refills | Status: DC

## 2022-11-26 MED ORDER — CARVEDILOL 6.25 MG PO TABS: 6.2500 mg | ORAL_TABLET | Freq: Two times a day (BID) | ORAL | 1 refills | Status: DC

## 2022-11-26 NOTE — Telephone Encounter (Addendum)
Call patient and left voicemail for patient to call office back.  ----- Message from Blane Ohara, MD sent at 11/25/2022 11:33 PM EDT ----- Regarding: BB blockers. Please check if patient is taking carvedilol and labetalol. Send appropriate rxs.  Dr. Sedalia Muta

## 2022-11-27 ENCOUNTER — Other Ambulatory Visit: Payer: Medicare Other

## 2022-11-27 DIAGNOSIS — E1140 Type 2 diabetes mellitus with diabetic neuropathy, unspecified: Secondary | ICD-10-CM

## 2022-11-28 LAB — COMPREHENSIVE METABOLIC PANEL
ALT: 19 IU/L (ref 0–32)
AST: 15 IU/L (ref 0–40)
Albumin/Globulin Ratio: 1.9 (ref 1.2–2.2)
Albumin: 4.4 g/dL (ref 3.8–4.8)
Alkaline Phosphatase: 92 IU/L (ref 44–121)
BUN/Creatinine Ratio: 21 (ref 12–28)
BUN: 17 mg/dL (ref 8–27)
Bilirubin Total: <0.2 mg/dL (ref 0.0–1.2)
CO2: 25 mmol/L (ref 20–29)
Calcium: 9.8 mg/dL (ref 8.7–10.3)
Chloride: 104 mmol/L (ref 96–106)
Creatinine, Ser: 0.81 mg/dL (ref 0.57–1.00)
Globulin, Total: 2.3 g/dL (ref 1.5–4.5)
Glucose: 202 mg/dL — ABNORMAL HIGH (ref 70–99)
Potassium: 4.9 mmol/L (ref 3.5–5.2)
Sodium: 144 mmol/L (ref 134–144)
Total Protein: 6.7 g/dL (ref 6.0–8.5)
eGFR: 76 mL/min/{1.73_m2} (ref >59)

## 2022-11-29 ENCOUNTER — Telehealth: Payer: Self-pay

## 2022-11-29 NOTE — Telephone Encounter (Signed)
Kathlene November from [facility] OT called stating that he was needing ok orders to go out an see patient twice a week for 2 weeks and once a week for 2 weeks. Kathlene November was given the ok.

## 2022-12-05 ENCOUNTER — Ambulatory Visit: Payer: Medicare Other | Admitting: Family Medicine

## 2022-12-17 ENCOUNTER — Other Ambulatory Visit: Payer: Self-pay | Admitting: Family Medicine

## 2022-12-24 ENCOUNTER — Telehealth: Payer: Self-pay

## 2022-12-24 LAB — DG BONE DENSITY: HM Dexa Scan: NORMAL

## 2022-12-24 NOTE — Telephone Encounter (Signed)
[facility] SUPPLEMENTAL ORDER FROM 12/10/2022 TO 12/10/2022

## 2022-12-26 ENCOUNTER — Other Ambulatory Visit: Payer: Self-pay

## 2022-12-26 DIAGNOSIS — N959 Unspecified menopausal and perimenopausal disorder: Secondary | ICD-10-CM

## 2023-02-10 ENCOUNTER — Ambulatory Visit: Payer: Medicare Other | Admitting: Family Medicine

## 2023-02-13 ENCOUNTER — Telehealth: Payer: Self-pay

## 2023-02-13 NOTE — Telephone Encounter (Signed)
Pt called today to request a same day appointment for the following symptoms:fever, sore throat, coughing, nausea, and she feels that her throat is swelling. Marland Kitchen Unfortunately, our schedule is full and we have no openings between today or tomorrow. I spoke with Dr. Sedalia Muta who advised her to go to urgent care or to the emergency department. Do not wait to be seen given her recent hx. Patient stated that she will go to [facility]

## 2023-02-24 ENCOUNTER — Encounter: Payer: Self-pay | Admitting: Physician Assistant

## 2023-02-24 ENCOUNTER — Ambulatory Visit (INDEPENDENT_AMBULATORY_CARE_PROVIDER_SITE_OTHER): Payer: Medicare Other | Admitting: Physician Assistant

## 2023-02-24 VITALS — BP 132/86 | HR 89 | Temp 97.1°F | Resp 18 | Ht 63.0 in | Wt 211.0 lb

## 2023-02-24 DIAGNOSIS — I480 Paroxysmal atrial fibrillation: Secondary | ICD-10-CM

## 2023-02-24 DIAGNOSIS — I700 Atherosclerosis of aorta: Secondary | ICD-10-CM

## 2023-02-24 DIAGNOSIS — E1142 Type 2 diabetes mellitus with diabetic polyneuropathy: Secondary | ICD-10-CM | POA: Diagnosis not present

## 2023-02-24 DIAGNOSIS — E1159 Type 2 diabetes mellitus with other circulatory complications: Secondary | ICD-10-CM

## 2023-02-24 DIAGNOSIS — E782 Mixed hyperlipidemia: Secondary | ICD-10-CM | POA: Diagnosis not present

## 2023-02-24 DIAGNOSIS — K219 Gastro-esophageal reflux disease without esophagitis: Secondary | ICD-10-CM

## 2023-02-24 DIAGNOSIS — J4540 Moderate persistent asthma, uncomplicated: Secondary | ICD-10-CM

## 2023-02-24 DIAGNOSIS — I152 Hypertension secondary to endocrine disorders: Secondary | ICD-10-CM

## 2023-02-24 DIAGNOSIS — G4733 Obstructive sleep apnea (adult) (pediatric): Secondary | ICD-10-CM

## 2023-02-24 DIAGNOSIS — H81393 Other peripheral vertigo, bilateral: Secondary | ICD-10-CM

## 2023-02-24 LAB — CBC WITH DIFFERENTIAL/PLATELET
Basophils Absolute: 0 10*3/uL (ref 0.0–0.2)
Basos: 1 %
EOS (ABSOLUTE): 0.3 10*3/uL (ref 0.0–0.4)
Eos: 3 %
Hematocrit: 40.8 % (ref 34.0–46.6)
Hemoglobin: 12.9 g/dL (ref 11.1–15.9)
Immature Grans (Abs): 0 10*3/uL (ref 0.0–0.1)
Immature Granulocytes: 0 %
Lymphocytes Absolute: 2.7 10*3/uL (ref 0.7–3.1)
Lymphs: 37 %
MCH: 26.8 pg (ref 26.6–33.0)
MCHC: 31.6 g/dL (ref 31.5–35.7)
MCV: 85 fL (ref 79–97)
Monocytes Absolute: 0.8 10*3/uL (ref 0.1–0.9)
Monocytes: 10 %
Neutrophils Absolute: 3.6 10*3/uL (ref 1.4–7.0)
Neutrophils: 49 %
Platelets: 276 10*3/uL (ref 150–450)
RBC: 4.82 x10E6/uL (ref 3.77–5.28)
RDW: 14.9 % (ref 11.7–15.4)
WBC: 7.4 10*3/uL (ref 3.4–10.8)

## 2023-02-24 LAB — CMP14+EGFR
ALT: 14 IU/L (ref 0–32)
AST: 16 IU/L (ref 0–40)
Albumin: 4 g/dL (ref 3.8–4.8)
Alkaline Phosphatase: 75 IU/L (ref 44–121)
BUN/Creatinine Ratio: 15 (ref 12–28)
BUN: 13 mg/dL (ref 8–27)
Bilirubin Total: 0.3 mg/dL (ref 0.0–1.2)
CO2: 27 mmol/L (ref 20–29)
Calcium: 9.4 mg/dL (ref 8.7–10.3)
Chloride: 103 mmol/L (ref 96–106)
Creatinine, Ser: 0.87 mg/dL (ref 0.57–1.00)
Globulin, Total: 2.7 g/dL (ref 1.5–4.5)
Glucose: 114 mg/dL — ABNORMAL HIGH (ref 70–99)
Potassium: 4.3 mmol/L (ref 3.5–5.2)
Sodium: 144 mmol/L (ref 134–144)
Total Protein: 6.7 g/dL (ref 6.0–8.5)
eGFR: 69 mL/min/{1.73_m2} (ref >59)

## 2023-02-24 LAB — HEMOGLOBIN A1C
Est. average glucose Bld gHb Est-mCnc: 197 mg/dL
Hgb A1c MFr Bld: 8.5 % — ABNORMAL HIGH (ref 4.8–5.6)

## 2023-02-24 LAB — LIPID PANEL
Chol/HDL Ratio: 3.1 ratio (ref 0.0–4.4)
Cholesterol, Total: 126 mg/dL (ref 100–199)
HDL: 41 mg/dL (ref >39)
LDL Chol Calc (NIH): 61 mg/dL (ref 0–99)
Triglycerides: 135 mg/dL (ref 0–149)
VLDL Cholesterol Cal: 24 mg/dL (ref 5–40)

## 2023-02-24 MED ORDER — PREGABALIN 150 MG PO CAPS
150.0000 mg | ORAL_CAPSULE | Freq: Two times a day (BID) | ORAL | 0 refills | Status: DC
Start: 2023-02-24 — End: 2023-06-11

## 2023-02-24 NOTE — Assessment & Plan Note (Signed)
Controlled Denies any major side effects or issues taking the medications Continue taking the Omeprazole 40mg  as directed  Will adjust medication as needed depending on symptoms

## 2023-02-24 NOTE — Assessment & Plan Note (Signed)
Well controlled.  Continue to work on eating a healthy diet and exercise.  Labs drawn today.   No major side effects reported, and no issues with compliance. The current medical regimen is effective;  continue present plan with Rosuvastatin 40mg  Will adjust medication as needed depending on labs Lab Results  Component Value Date   LDLCALC 60 11/06/2022

## 2023-02-24 NOTE — Assessment & Plan Note (Addendum)
Continue to monitor symptoms with arm tingling.  If she begins to have SOB, chest pain, HA, vision changes she will go to the ER Stated the lyrica has become less effective and was interested in potentially going back to Gabapentin Increased her Lyrica dose to see if that will help before totally switching medicines. Continue taking Semaglutide 1mg  as directed Will increase to 2mg  if her sugars are still around 9.9

## 2023-02-24 NOTE — Progress Notes (Signed)
Subjective:  Patient ID: Caitlin [name], female    DOB: 1946-05-08  Age: 77 y.o. MRN: [id number]  Chief Complaint  Patient presents with   Medical Management of Chronic Issues    HPI Patient in office today for follow up for diabetes, hypertension, and hyperlipidemia. Having Concerns today about her left arm being tingling from her shoulder to her finger for about two weeks. She denies it getting any worse or better. She denies being able to do anything that will make it any better or worse. She did admit to sleeping on that shoulder very frequently. She also admits to and extensive history of arthritis and having it affect multiple joints throughout her body. Denied any history of carpel tunnel or ulnar tunnel. Admits to a minor ache in her shoulder. Denies any issues with the right arm. Does have an appointment today with her back surgeon as well.   Diabetes:  Complications: neuropathy Glucose checking: CGM Glucose logs:150-300 Hypoglycemia: no Most recent A1C: 9.9 Current medications: tresiba 56 U daily and ozempic 1 mg weekly. ON lyrica 100 mg one at noon and 2 at night.   Foot checks: daily   Hyperlipidemia: Current medications: crestor 40 mg daily. Vascepa 1 gm 2 capsules twice daily.    Hypertension: Complications: Current medications: Labetalol changed to coreg by cardiology. Lasix 80 mg daily.   Atrial fibrillation: on caredilol and xarelto.          02/24/2023    7:35 AM 10/22/2022   11:03 AM 04/04/2022   11:30 AM 02/26/2022   11:17 AM 10/05/2021    8:33 AM  Depression screen PHQ 2/9  Decreased Interest 0 0 0 0 1  Down, Depressed, Hopeless 0 0 0 0 1  PHQ - 2 Score 0 0 0 0 2  Altered sleeping 0 1   1  Tired, decreased energy 0 1   1  Change in appetite 0 0   1  Feeling bad or failure about yourself  0 0   1  Trouble concentrating 0 0   1  Moving slowly or fidgety/restless 0 0   1  Suicidal thoughts 0 0   1  PHQ-9 Score 0 2   9  Difficult doing work/chores Not  difficult at all Not difficult at all   Somewhat difficult        02/24/2023    7:35 AM  Fall Risk   Falls in the past year? 0  Number falls in past yr: 0  Injury with Fall? 0  Risk for fall due to : No Fall Risks  Follow up Falls evaluation completed    Patient Care Team: Blane Ohara, MD as PCP - General (Family Medicine) Ocie Cornfield, MD as Referring Physician (Internal Medicine) Levert Feinstein, MD as Consulting Physician (Neurology) Baldo Daub, MD as Consulting Physician (Cardiology) Annice Pih., MD (Urology) Arman Bogus, MD as Consulting Physician (Neurosurgery) Margarette Canada., DPM (Podiatry)   Review of Systems  Constitutional:  Negative for fatigue.  HENT:  Negative for congestion, ear pain and sore throat.   Respiratory:  Negative for cough and shortness of breath.   Cardiovascular:  Negative for chest pain.  Gastrointestinal:  Negative for abdominal pain, constipation, diarrhea, nausea and vomiting.  Genitourinary:  Negative for dysuria, frequency and urgency.  Musculoskeletal:  Negative for arthralgias, back pain and myalgias.  Neurological:  Negative for dizziness and headaches.  Psychiatric/Behavioral:  Negative for agitation and sleep disturbance. The patient is not nervous/anxious.  Current Outpatient Medications on File Prior to Visit  Medication Sig Dispense Refill   albuterol (PROVENTIL) (2.5 MG/3ML) 0.083% nebulizer solution Take 3 mLs (2.5 mg total) by nebulization every 6 (six) hours as needed for wheezing or shortness of breath. 75 mL 0   albuterol (VENTOLIN HFA) 108 (90 Base) MCG/ACT inhaler Inhale 2 puffs into the lungs every 6 (six) hours as needed for wheezing or shortness of breath. 8 g 3   Apple Cid Vn-Grn Tea-Bit Or-Cr (APPLE CIDER VINEGAR PLUS PO) Take by mouth.     BD PEN NEEDLE NANO 2ND GEN 32G X 4 MM MISC 1 EACH BY DOES NOT APPLY ROUTE IN THE MORNING AND AT BEDTIME. 100 each 2   Budeson-Glycopyrrol-Formoterol (BREZTRI  AEROSPHERE) 160-9-4.8 MCG/ACT AERO Inhale 2 puffs into the lungs in the morning and at bedtime. 10.7 g 3   buPROPion (WELLBUTRIN XL) 300 MG 24 hr tablet Once daily in am. 90 tablet 1   calcium carbonate (TUMS - DOSED IN MG ELEMENTAL CALCIUM) 500 MG chewable tablet Chew 2 tablets by mouth daily as needed for indigestion.      carvedilol (COREG) 6.25 MG tablet Take 1 tablet (6.25 mg total) by mouth 2 (two) times daily. 180 tablet 1   Continuous Blood Gluc Receiver (FREESTYLE LIBRE 14 DAY READER) DEVI USE TO CHECK BLOOD SUGARS DAILY     Continuous Blood Gluc Sensor (FREESTYLE LIBRE 14 DAY SENSOR) MISC SMARTSIG:1 Topical Every 2 Weeks     diltiazem (CARDIZEM CD) 300 MG 24 hr capsule TAKE 1 CAPSULE BY MOUTH EVERY DAY 90 capsule 0   fexofenadine (ALLEGRA) 180 MG tablet Take 180 mg by mouth daily as needed for allergies or rhinitis.     furosemide (LASIX) 80 MG tablet TAKE 1 TABLET BY MOUTH 2 TIMES DAILY. 180 tablet 0   hydrOXYzine (VISTARIL) 25 MG capsule TAKE 1 CAPSULE (25 MG TOTAL) BY MOUTH EVERY 8 (EIGHT) HOURS AS NEEDED FOR ANXIETY. 270 capsule 1   insulin degludec (TRESIBA FLEXTOUCH) 200 UNIT/ML FlexTouch Pen Inject 56 Units into the skin every evening. 27 mL 0   Insulin Pen Needle 31G X 8 MM MISC Inject 1 Units into the skin daily.     omega-3 acid ethyl esters (LOVAZA) 1 g capsule Take 2 capsules (2 g total) by mouth 2 (two) times daily. 360 capsule 0   omeprazole (PRILOSEC) 40 MG capsule TAKE 1 CAPSULE(S) BY MOUTH DAILY IN THE MORNING 90 capsule 1   ondansetron (ZOFRAN-ODT) 4 MG disintegrating tablet Take by mouth.     oxybutynin (DITROPAN-XL) 10 MG 24 hr tablet Take 10 mg by mouth daily.     rivaroxaban (XARELTO) 20 MG TABS tablet Take by mouth at bedtime.     rosuvastatin (CRESTOR) 40 MG tablet Take 1 tablet (40 mg total) by mouth daily. 90 tablet 0   Semaglutide, 1 MG/DOSE, 4 MG/3ML SOPN Inject 2 mg as directed once a week. 3 mL 2   traMADol (ULTRAM) 50 MG tablet Take 50 mg by mouth 2 (two)  times daily as needed.     VITAMIN D-VITAMIN K PO Take 1 tablet by mouth every evening.     No current facility-administered medications on file prior to visit.   Past Medical History:  Diagnosis Date   Arthritis    Asthma    Atrial fibrillation (HCC)    Cauda equina syndrome (HCC) 12/13/2019   Complication of anesthesia    COPD (chronic obstructive pulmonary disease) (HCC)    Depression  Diabetes mellitus without complication (HCC)    Type II   Diabetic neuropathy, type II diabetes mellitus (HCC) 12/13/2019   Dysrhythmia    GERD (gastroesophageal reflux disease)    HTN (hypertension)    Hypercholesteremia    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) 12/13/2019   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) 12/13/2019   Morbid obesity (HCC) 12/13/2019   Obstructive sleep apnea    PONV (postoperative nausea and vomiting)    "not the last few times"   RLS (restless legs syndrome)    S/P ablation of atrial flutter 02/12/2016   S/P lumbar fusion 08/04/2019   Sleep apnea    Upper respiratory tract infection due to COVID-19 virus 08/14/2021   Past Surgical History:  Procedure Laterality Date   ATRIAL FIBRILLATION ABLATION     x3   CATARACT EXTRACTION Bilateral    COLONOSCOPY  01/04/2013   Moderate predominantly sigmoid diverticulosis. Small internal hemorrhoids. Otherwise normal colonosopy.    LUMBAR WOUND DEBRIDEMENT N/A 08/09/2019   Procedure: LUMBAR WOUND DEBRIDEMENT Evacuation of  EPIDURAL HEMATOMA;  Surgeon: Tressie Stalker, MD;  Location[facility];  Service: Neurosurgery;  Laterality: N/A;    Family History  Problem Relation Age of Onset   Breast cancer Mother 25   Heart attack Father 25   Mental illness Sister    Heart Problems Brother 64   Cancer Brother    Breast cancer Paternal Grandmother    Colon cancer Paternal Aunt 77   Social History   Socioeconomic History   Marital status: Married    Spouse name: Onalee Hua   Number of  children: Not on file   Years of education: Not on file   Highest education level: Not on file  Occupational History   Occupation: Retired Pensions consultant  Tobacco Use   Smoking status: Never    Passive exposure: Past   Smokeless tobacco: Never  Vaping Use   Vaping Use: Never used  Substance and Sexual Activity   Alcohol use: Not Currently   Drug use: Never   Sexual activity: Not Currently  Other Topics Concern   Not on file  Social History Narrative   Not on file   Social Determinants of Health   Financial Resource Strain: Low Risk  (04/04/2022)   Overall Financial Resource Strain (CARDIA)    Difficulty of Paying Living Expenses: Not hard at all  Food Insecurity: No Food Insecurity (04/04/2022)   Hunger Vital Sign    Worried About Running Out of Food in the Last Year: Never true    Ran Out of Food in the Last Year: Never true  Transportation Needs: No Transportation Needs (04/04/2022)   PRAPARE - Administrator, Civil Service (Medical): No    Lack of Transportation (Non-Medical): No  Physical Activity: Inactive (04/04/2022)   Exercise Vital Sign    Days of Exercise per Week: 0 days    Minutes of Exercise per Session: 0 min  Stress: No Stress Concern Present (04/04/2022)   [employer] of Occupational Health - Occupational Stress Questionnaire    Feeling of Stress : Not at all  Social Connections: Moderately Integrated (11/06/2022)   Social Connection and Isolation Panel [NHANES]    Frequency of Communication with Friends and Family: Twice a week    Frequency of Social Gatherings with Friends and Family: Twice a week    Attends Religious Services: More than 4 times per year    Active Member of Clubs or Organizations: No  Attends Banker Meetings: Never    Marital Status: Married    Objective:  BP 132/86 (BP Location: Left Arm, Patient Position: Sitting, Cuff Size: Large)   Pulse 89   Temp (!) 97.1 F (36.2 C) (Temporal)   Resp 18   Ht  5\' 3"  (1.6 m)   Wt 211 lb (95.7 kg)   SpO2 94%   BMI 37.38 kg/m      02/24/2023    7:30 AM 11/06/2022    1:28 PM 10/22/2022   10:58 AM  BP/Weight  Systolic BP 132 128 132  Diastolic BP 86 82 62  Wt. (Lbs) 211 209 214  BMI 37.38 kg/m2 37.02 kg/m2 37.91 kg/m2    Physical Exam Vitals reviewed.  Constitutional:      Appearance: Normal appearance.  Neck:     Vascular: No carotid bruit.  Cardiovascular:     Rate and Rhythm: Normal rate and regular rhythm.     Heart sounds: Normal heart sounds.  Pulmonary:     Effort: Pulmonary effort is normal.     Breath sounds: Normal breath sounds.  Abdominal:     General: Bowel sounds are normal.     Palpations: Abdomen is soft.     Tenderness: There is no abdominal tenderness.  Musculoskeletal:     Right shoulder: Normal.     Left shoulder: Tenderness present. Normal range of motion. Normal strength. Normal pulse.     Right upper arm: Normal.     Left upper arm: Normal.     Right elbow: Normal.     Left elbow: Normal.     Right forearm: Normal.     Left forearm: Normal.     Right wrist: Normal.     Left wrist: No swelling. Normal pulse.  Neurological:     Mental Status: She is alert and oriented to person, place, and time.  Psychiatric:        Mood and Affect: Mood normal.        Behavior: Behavior normal.     Diabetic Foot Exam - Simple   Simple Foot Form Diabetic Foot exam was performed with the following findings: Yes 02/24/2023  7:39 AM  Visual Inspection No deformities, no ulcerations, no other skin breakdown bilaterally: Yes See comments: Yes Sensation Testing Intact to touch and monofilament testing bilaterally: Yes Pulse Check Posterior Tibialis and Dorsalis pulse intact bilaterally: Yes Comments Patient has callus on her left top of her foot, stated it's been there for years.      Lab Results  Component Value Date   WBC 14.2 (H) 10/22/2022   HGB 12.6 10/22/2022   HCT 38.9 10/22/2022   PLT 267 10/22/2022    GLUCOSE 202 (H) 11/27/2022   CHOL 122 11/06/2022   TRIG 114 11/06/2022   HDL 41 11/06/2022   LDLCALC 60 11/06/2022   ALT 19 11/27/2022   AST 15 11/27/2022   NA 144 11/27/2022   K 4.9 11/27/2022   CL 104 11/27/2022   CREATININE 0.81 11/27/2022   BUN 17 11/27/2022   CO2 25 11/27/2022   TSH 3.630 07/15/2022   INR 0.9 08/04/2019   HGBA1C 9.9 (H) 11/06/2022      Assessment & Plan:    Hypertension associated with diabetes (HCC) Assessment & Plan: Well controlled.  Continue to work on eating a healthy diet and exercise.  Labs drawn today.   No major side effects reported, and no issues with compliance. The current medical regimen is effective;  continue present  plan with Carvedilol 6.5mg  Will adjust medication as needed depending on labs BP Readings from Last 3 Encounters:  02/24/23 132/86  11/06/22 128/82  10/22/22 132/62     Orders: -     CBC with Differential/Platelet -     CMP14+EGFR -     Hemoglobin A1c  Diabetic peripheral neuropathy associated with type 2 diabetes mellitus (HCC) Assessment & Plan: Continue to monitor symptoms with arm tingling.  If she begins to have SOB, chest pain, HA, vision changes she will go to the ER Stated the lyrica has become less effective and was interested in potentially going back to Gabapentin Increased her Lyrica dose to see if that will help before totally switching medicines. Continue taking Semaglutide 1mg  as directed Will increase to 2mg  if her sugars are still around 9.9  Orders: -     Pregabalin; Take 1 capsule (150 mg total) by mouth 2 (two) times daily.  Dispense: 60 capsule; Refill: 0  Mixed hyperlipidemia Assessment & Plan: Well controlled.  Continue to work on eating a healthy diet and exercise.  Labs drawn today.   No major side effects reported, and no issues with compliance. The current medical regimen is effective;  continue present plan with Rosuvastatin 40mg  Will adjust medication as needed depending on  labs Lab Results  Component Value Date   LDLCALC 60 11/06/2022     Orders: -     Lipid panel  Paroxysmal atrial fibrillation (HCC) Assessment & Plan: Denies any issues or side effects taking the Xeralto 20mg   Continue taking Xeralto 20mg  as directed.   OSA (obstructive sleep apnea)  Moderate persistent asthma without complication Assessment & Plan: Controlled Continue using the Breztri and Albuterol as directed Will adjust treatment if symptoms change   Aortic atherosclerosis (HCC)  Gastroesophageal reflux disease without esophagitis Assessment & Plan: Controlled Denies any major side effects or issues taking the medications Continue taking the Omeprazole 40mg  as directed  Will adjust medication as needed depending on symptoms   Peripheral vertigo of both ears     Meds ordered this encounter  Medications   pregabalin (LYRICA) 150 MG capsule    Sig: Take 1 capsule (150 mg total) by mouth 2 (two) times daily.    Dispense:  60 capsule    Refill:  0    Orders Placed This Encounter  Procedures   CBC with Differential/Platelet   CMP14+EGFR   Lipid panel   Hemoglobin A1c     Follow-up: Return in about 3 months (around 05/27/2023) for Chronic, fasting, Dr. Criselda Peaches.   I,Jacqua L Marsh,acting as a scribe for [employer], PA.,have documented all relevant documentation on the behalf of Langley Gauss, PA,as directed by  Langley Gauss, PA while in the presence of Langley Gauss, [place].   An After Visit Summary was printed and given to the patient.  Langley Gauss, [place] [facility] [phone]

## 2023-02-24 NOTE — Assessment & Plan Note (Signed)
Controlled Continue using the Breztri and Albuterol as directed Will adjust treatment if symptoms change

## 2023-02-24 NOTE — Assessment & Plan Note (Signed)
Well controlled.  Continue to work on eating a healthy diet and exercise.  Labs drawn today.   No major side effects reported, and no issues with compliance. The current medical regimen is effective;  continue present plan with Carvedilol 6.5mg  Will adjust medication as needed depending on labs BP Readings from Last 3 Encounters:  02/24/23 132/86  11/06/22 128/82  10/22/22 132/62

## 2023-02-24 NOTE — Assessment & Plan Note (Signed)
Denies any issues or side effects taking the Xeralto 20mg   Continue taking Xeralto 20mg  as directed.

## 2023-03-05 ENCOUNTER — Other Ambulatory Visit: Payer: Self-pay | Admitting: Family Medicine

## 2023-03-20 ENCOUNTER — Encounter: Payer: Self-pay | Admitting: Physician Assistant

## 2023-03-20 ENCOUNTER — Ambulatory Visit (INDEPENDENT_AMBULATORY_CARE_PROVIDER_SITE_OTHER): Payer: Medicare Other | Admitting: Physician Assistant

## 2023-03-20 VITALS — BP 130/78 | HR 94 | Temp 96.8°F | Ht 63.0 in | Wt 218.0 lb

## 2023-03-20 DIAGNOSIS — R350 Frequency of micturition: Secondary | ICD-10-CM | POA: Insufficient documentation

## 2023-03-20 DIAGNOSIS — R110 Nausea: Secondary | ICD-10-CM | POA: Diagnosis not present

## 2023-03-20 LAB — POCT URINALYSIS DIP (CLINITEK)
Bilirubin, UA: NEGATIVE
Blood, UA: NEGATIVE
Glucose, UA: NEGATIVE mg/dL
Ketones, POC UA: NEGATIVE mg/dL
Leukocytes, UA: NEGATIVE
Nitrite, UA: NEGATIVE
POC PROTEIN,UA: NEGATIVE
Spec Grav, UA: 1.02 (ref 1.010–1.025)
Urobilinogen, UA: 0.2 E.U./dL
pH, UA: 6 (ref 5.0–8.0)

## 2023-03-20 MED ORDER — MIRABEGRON ER 25 MG PO TB24
25.0000 mg | ORAL_TABLET | Freq: Every day | ORAL | 1 refills | Status: DC
Start: 2023-03-20 — End: 2023-10-27

## 2023-03-20 MED ORDER — ONDANSETRON HCL 4 MG PO TABS: 4.0000 mg | ORAL_TABLET | Freq: Three times a day (TID) | ORAL | 0 refills | Status: DC | PRN

## 2023-03-20 NOTE — Patient Instructions (Addendum)
Continue taking the Ozempic as directed  Discontinue taking the Oxybutynin  Watch out for frequent UTI symptoms  Start Myrbetriq

## 2023-03-20 NOTE — Progress Notes (Signed)
Acute Office Visit  Subjective:    Patient ID: Caitlin [name], female    DOB: August 20, 1945, 77 y.o.   MRN: [id number]  Chief Complaint  Patient presents with   Urinary Tract Infection    HPI: Patient is in today for Urinary symptoms  [facility] neurology.   She reports new onset urinary frequency, urinary urgency, and polydipsia . The current episode started about a week ago and is staying constant. Patient states symptoms are moderate in intensity, occurring constantly. She  has not been recently treated for similar symptoms. Has not tried any medications. She has been on Oxybutynin but it has not helped with her symptoms and they have now began to worsen even after being on the oxybutynin for over 6 months. She admits to a deep ache in her suprapubic area. Admits to having multiple instances where she will have the urge to go and almost right when she gets the urge she begins to void.      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) 12/13/2019   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) 12/13/2019   Morbid obesity (HCC) 12/13/2019   Obstructive sleep apnea    PONV (postoperative nausea and vomiting)    "not the last few times"   RLS (restless legs syndrome)    S/P ablation of atrial flutter 02/12/2016   S/P lumbar fusion 08/04/2019   Sleep apnea    Upper respiratory tract infection due to COVID-19 virus 08/14/2021    Past Surgical History:  Procedure Laterality Date   ATRIAL  FIBRILLATION ABLATION     x3   CATARACT EXTRACTION Bilateral    COLONOSCOPY  01/04/2013   Moderate predominantly sigmoid diverticulosis. Small internal hemorrhoids. Otherwise normal colonosopy.    LUMBAR WOUND DEBRIDEMENT N/A 08/09/2019   Procedure: LUMBAR WOUND DEBRIDEMENT Evacuation of  EPIDURAL HEMATOMA;  Surgeon: Tressie Stalker, MD;  Location: [facility] OR;  Service: Neurosurgery;  Laterality: N/A;    Family History  Problem Relation Age of Onset   Breast cancer Mother 1   Heart attack Father 63   Mental illness Sister    Heart Problems Brother 8   Cancer Brother    Breast cancer Paternal Grandmother    Colon cancer Paternal Aunt 29    Social History   Socioeconomic History   Marital status: Married    Spouse name: Onalee Hua   Number of children: Not on file   Years of education: Not on file   Highest education level: Not on file  Occupational History   Occupation: Retired Pensions consultant  Tobacco Use   Smoking status: Never    Passive exposure: Past   Smokeless tobacco: Never  Vaping Use   Vaping status: Never Used  Substance and Sexual Activity   Alcohol use: Not Currently   Drug use: Never   Sexual activity: Not Currently  Other Topics Concern   Not on file  Social History Narrative   Not on file   Social  Determinants of Health   Financial Resource Strain: Low Risk  (04/04/2022)   Overall Financial Resource Strain (CARDIA)    Difficulty of Paying Living Expenses: Not hard at all  Food Insecurity: Low Risk  (10/11/2022)   [facility]   Food vital sign    Within the past 12 months, you worried that your food would run out before you got money to buy more: Never true    Within the past 12 months, the food you bought just didn't last and you didn't have money to get more: Not on file  Transportation Needs: No Transportation Needs (10/11/2022)   [facility]   Transportation    In the past 12 months, has  lack of reliable transportation kept you from medical appointments, meetings, work or from getting things needed for daily living? : No  Physical Activity: Inactive (04/04/2022)   Exercise Vital Sign    Days of Exercise per Week: 0 days    Minutes of Exercise per Session: 0 min  Stress: No Stress Concern Present (04/04/2022)   [employer] of Occupational Health - Occupational Stress Questionnaire    Feeling of Stress : Not at all  Social Connections: Moderately Integrated (11/06/2022)   Social Connection and Isolation Panel [NHANES]    Frequency of Communication with Friends and Family: Twice a week    Frequency of Social Gatherings with Friends and Family: Twice a week    Attends Religious Services: More than 4 times per year    Active Member of Clubs or Organizations: No    Attends Banker Meetings: Never    Marital Status: Married  Catering manager Violence: Low Risk  (10/11/2022)   [facility] prior to 10/19/2022., [facility] visits prior to 10/19/2022.   Safety    How often does anyone, including family and friends, physically hurt you?: Never    How often does anyone, including family and friends, insult or talk down to you?: Never    How often does anyone, including family and friends, threaten you with harm?: Never    How often does anyone, including family and friends, scream or curse at you?: Never    Outpatient Medications Prior to Visit  Medication Sig Dispense Refill   albuterol (PROVENTIL) (2.5 MG/3ML) 0.083% nebulizer solution Take 3 mLs (2.5 mg total) by nebulization every 6 (six) hours as needed for wheezing or shortness of breath. 75 mL 0   albuterol (VENTOLIN HFA) 108 (90 Base) MCG/ACT inhaler Inhale 2 puffs into the lungs every 6 (six) hours as needed for wheezing or shortness of breath. 8 g 3   Apple Cid Vn-Grn Tea-Bit Or-Cr (APPLE CIDER VINEGAR PLUS PO) Take by mouth.     BD PEN NEEDLE  NANO 2ND GEN 32G X 4 MM MISC 1 EACH BY DOES NOT APPLY ROUTE IN THE MORNING AND AT BEDTIME. 100 each 2   Budeson-Glycopyrrol-Formoterol (BREZTRI AEROSPHERE) 160-9-4.8 MCG/ACT AERO Inhale 2 puffs into the lungs in the morning and at bedtime. 10.7 g 3   buPROPion (WELLBUTRIN XL) 300 MG 24 hr tablet Once daily in am. 90 tablet 1   calcium carbonate (TUMS - DOSED IN MG ELEMENTAL CALCIUM) 500 MG chewable tablet Chew 2 tablets by mouth daily as needed for indigestion.      carvedilol (COREG) 6.25 MG tablet Take 1 tablet (6.25 mg total) by mouth 2 (two) times daily. 180 tablet 1  Continuous Blood Gluc Receiver (FREESTYLE LIBRE 14 DAY READER) DEVI USE TO CHECK BLOOD SUGARS DAILY     Continuous Blood Gluc Sensor (FREESTYLE LIBRE 14 DAY SENSOR) MISC SMARTSIG:1 Topical Every 2 Weeks     diltiazem (CARDIZEM CD) 300 MG 24 hr capsule TAKE 1 CAPSULE BY MOUTH EVERY DAY 90 capsule 0   fexofenadine (ALLEGRA) 180 MG tablet Take 180 mg by mouth daily as needed for allergies or rhinitis.     furosemide (LASIX) 80 MG tablet TAKE 1 TABLET BY MOUTH 2 TIMES DAILY. 180 tablet 0   hydrOXYzine (VISTARIL) 25 MG capsule TAKE 1 CAPSULE (25 MG TOTAL) BY MOUTH EVERY 8 (EIGHT) HOURS AS NEEDED FOR ANXIETY. 270 capsule 1   insulin degludec (TRESIBA FLEXTOUCH) 200 UNIT/ML FlexTouch Pen Inject 56 Units into the skin every evening. 27 mL 0   Insulin Pen Needle 31G X 8 MM MISC Inject 1 Units into the skin daily.     omega-3 acid ethyl esters (LOVAZA) 1 g capsule TAKE 2 CAPSULES BY MOUTH 2 TIMES DAILY. 360 capsule 0   omeprazole (PRILOSEC) 40 MG capsule TAKE 1 CAPSULE(S) BY MOUTH DAILY IN THE MORNING 90 capsule 1   pregabalin (LYRICA) 150 MG capsule Take 1 capsule (150 mg total) by mouth 2 (two) times daily. 60 capsule 0   rivaroxaban (XARELTO) 20 MG TABS tablet Take by mouth at bedtime.     rosuvastatin (CRESTOR) 40 MG tablet Take 1 tablet (40 mg total) by mouth daily. 90 tablet 0   Semaglutide, 1 MG/DOSE, 4 MG/3ML SOPN Inject 2 mg as  directed once a week. 3 mL 2   traMADol (ULTRAM) 50 MG tablet Take 50 mg by mouth 2 (two) times daily as needed.     VITAMIN D-VITAMIN K PO Take 1 tablet by mouth every evening.     ondansetron (ZOFRAN-ODT) 4 MG disintegrating tablet Take by mouth.     oxybutynin (DITROPAN-XL) 10 MG 24 hr tablet Take 10 mg by mouth daily.     No facility-administered medications prior to visit.    Allergies  Allergen Reactions   Valsartan Swelling   Codeine Nausea Only    Review of Systems  Constitutional:  Negative for chills and fever.  Cardiovascular:  Negative for chest pain.  Gastrointestinal:  Positive for abdominal pain (lower soreness). Negative for nausea.  Endocrine: Positive for polydipsia and polyuria. Negative for polyphagia.  Genitourinary:  Positive for frequency and urgency. Negative for dysuria and flank pain.  Musculoskeletal:  Negative for back pain.       Objective:        03/20/2023   10:18 AM 02/24/2023    7:30 AM 11/06/2022    1:28 PM  Vitals with BMI  Height  5\' 3"  5\' 3"   Weight 218 lbs 211 lbs 209 lbs  BMI 38.63 37.39 37.03  Systolic 130 132 161  Diastolic 78 86 82  Pulse 94 89 80    No data found.   Physical Exam Vitals reviewed.  Constitutional:      Appearance: Normal appearance.  Cardiovascular:     Rate and Rhythm: Normal rate and regular rhythm.     Heart sounds: Normal heart sounds.  Pulmonary:     Effort: Pulmonary effort is normal.     Breath sounds: Normal breath sounds.  Abdominal:     General: Bowel sounds are normal.     Palpations: Abdomen is soft.     Tenderness: There is no abdominal tenderness.  Neurological:  Mental Status: She is alert and oriented to person, place, and time.  Psychiatric:        Mood and Affect: Mood normal.        Behavior: Behavior normal.     Health Maintenance Due  Topic Date Due   Zoster Vaccines- Shingrix (1 of 2) 08/[birth year]/1966   OPHTHALMOLOGY EXAM  12/08/2021   COVID-19 Vaccine (4 - 2023-24 season)  04/19/2022   INFLUENZA VACCINE  03/20/2023   Medicare Annual Wellness (AWV)  04/05/2023    There are no preventive care reminders to display for this patient.   Lab Results  Component Value Date   TSH 3.630 07/15/2022   Lab Results  Component Value Date   WBC 7.4 02/24/2023   HGB 12.9 02/24/2023   HCT 40.8 02/24/2023   MCV 85 02/24/2023   PLT 276 02/24/2023   Lab Results  Component Value Date   NA 144 02/24/2023   K 4.3 02/24/2023   CO2 27 02/24/2023   GLUCOSE 114 (H) 02/24/2023   BUN 13 02/24/2023   CREATININE 0.87 02/24/2023   BILITOT 0.3 02/24/2023   ALKPHOS 75 02/24/2023   AST 16 02/24/2023   ALT 14 02/24/2023   PROT 6.7 02/24/2023   ALBUMIN 4.0 02/24/2023   CALCIUM 9.4 02/24/2023   ANIONGAP 14 08/10/2019   EGFR 69 02/24/2023   Lab Results  Component Value Date   CHOL 126 02/24/2023   Lab Results  Component Value Date   HDL 41 02/24/2023   Lab Results  Component Value Date   LDLCALC 61 02/24/2023   Lab Results  Component Value Date   TRIG 135 02/24/2023   Lab Results  Component Value Date   CHOLHDL 3.1 02/24/2023   Lab Results  Component Value Date   HGBA1C 8.5 (H) 02/24/2023   Total time spent on today's visit was greater than 30 minutes, including both face-to-face time and nonface-to-face time personally spent on review of chart (labs and imaging), discussing labs and goals, discussing further work-up, treatment options, referrals to specialist if needed, reviewing outside records of pertinent, answering patient's questions, and coordinating care.     Assessment & Plan:  Urinary frequency Assessment & Plan: Discontinued Oxybutinin Started Myrbetriq 25mg     Orders: -     POCT URINALYSIS DIP (CLINITEK) -     Mirabegron ER; Take 1 tablet (25 mg total) by mouth daily.  Dispense: 30 tablet; Refill: 1  Nausea -     Ondansetron HCl; Take 1 tablet (4 mg total) by mouth every 8 (eight) hours as needed for nausea or vomiting.  Dispense: 20  tablet; Refill: 0     Meds ordered this encounter  Medications   mirabegron ER (MYRBETRIQ) 25 MG TB24 tablet    Sig: Take 1 tablet (25 mg total) by mouth daily.    Dispense:  30 tablet    Refill:  1   ondansetron (ZOFRAN) 4 MG tablet    Sig: Take 1 tablet (4 mg total) by mouth every 8 (eight) hours as needed for nausea or vomiting.    Dispense:  20 tablet    Refill:  0    Orders Placed This Encounter  Procedures   POCT URINALYSIS DIP (CLINITEK)     Follow-up: Return if symptoms worsen or fail to improve.  An After Visit Summary was printed and given to the patient.  Langley Gauss, [facility] [phone]

## 2023-03-20 NOTE — Assessment & Plan Note (Signed)
Discontinued Oxybutinin Started Myrbetriq 25mg 

## 2023-04-15 ENCOUNTER — Ambulatory Visit (INDEPENDENT_AMBULATORY_CARE_PROVIDER_SITE_OTHER): Payer: Medicare Other

## 2023-04-15 VITALS — BP 110/80 | HR 82 | Temp 97.7°F | Ht 63.0 in | Wt 205.0 lb

## 2023-04-15 DIAGNOSIS — R42 Dizziness and giddiness: Secondary | ICD-10-CM | POA: Diagnosis not present

## 2023-04-15 DIAGNOSIS — R0789 Other chest pain: Secondary | ICD-10-CM | POA: Diagnosis not present

## 2023-04-15 DIAGNOSIS — J4540 Moderate persistent asthma, uncomplicated: Secondary | ICD-10-CM

## 2023-04-15 NOTE — Patient Instructions (Signed)
Not sure why you have the chest tightness and shortness of breath for 2 weeks now Does not appear to be a heart attack or pneumonia or blood clot in your lungs Will order chest x ray to rule out pneumonia/ fluid in your lungs Lets do some blood work Sent nebulizer machine to your pharmacy Report back if any worsening symptoms or call 911 for any severe symptoms

## 2023-04-15 NOTE — Progress Notes (Signed)
Acute Office Visit  Subjective:    Patient ID: Caitlin [name], female    DOB: [birth year], 77 y.o.   MRN: [id number]  Chief Complaint  Patient presents with   Can't catch her breath    HPI Patient is in today for Chest tightness. Patient states that she feels like most times she can't catch her breath. She also states that she feels dizzy from time to time and its been going on now for 2 and half weeks.  States she feels something heavy on her chest. STATES IT IS SLOWLY GETTING WORSE. States she has AFIB and is wondering if it is causing it. Has been taking lasix for the past 6 days to see if it helps relieve fluid around her lungs- might have helped a little.   Left arm has been tingling for at least 2 months now- left thumb.  Finished an antibiotic Augmentin for 10 days, last night for a left foot infection after she dropped something on her right foot.  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 Small internal hemorrhoids. Otherwise normal colonosopy.    LUMBAR WOUND DEBRIDEMENT N/A 08/09/2019   Procedure: LUMBAR WOUND DEBRIDEMENT Evacuation of  EPIDURAL HEMATOMA;  Surgeon: Tressie Stalker, MD;  Location: [facility] OR;  Service: Neurosurgery;  Laterality: N/A;    Family History  Problem Relation Age of Onset   Breast cancer Mother 65   Heart attack Father 59   Mental illness Sister    Heart Problems Brother 81   Cancer Brother    Breast cancer Paternal Grandmother    Colon cancer Paternal Aunt 54    Social History   Socioeconomic History   Marital status: Married    Spouse name: Onalee Hua   Number of children: Not on file   Years of education: Not on file   Highest education level: Not on file  Occupational History   Occupation: Retired Pensions consultant  Tobacco Use   Smoking status: Never    Passive exposure: Past   Smokeless tobacco: Never  Vaping Use   Vaping status: Never Used  Substance and Sexual Activity   Alcohol use: Not Currently   Drug use: Never   Sexual activity: Not Currently  Other Topics Concern   Not on file  Social History Narrative   Not on  file   Social Determinants of Health   Financial Resource Strain: Low Risk  (04/04/2022)   Overall Financial Resource Strain (CARDIA)    Difficulty of Paying Living Expenses: Not hard at all  Food Insecurity: Low Risk  (10/11/2022)   [facility]   Food vital sign    Within the past 12 months, you worried that your food would run out before you got money to buy more: Never true    Within the past 12 months, the food you bought just didn't last and you didn't have money to get more: Not on file  Transportation Needs: No Transportation Needs (10/11/2022)   [facility]   Transportation    In the past 12 months, has lack of reliable transportation kept you  from medical appointments, meetings, work or from getting things needed for daily living? : No  Physical Activity: Inactive (04/04/2022)   Exercise Vital Sign    Days of Exercise per Week: 0 days    Minutes of Exercise per Session: 0 min  Stress: No Stress Concern Present (04/04/2022)   [employer] of Occupational Health - Occupational Stress Questionnaire    Feeling of Stress : Not at all  Social Connections: Moderately Integrated (11/06/2022)   Social Connection and Isolation Panel [NHANES]    Frequency of Communication with Friends and Family: Twice a week    Frequency of Social Gatherings with Friends and Family: Twice a week    Attends Religious Services: More than 4 times per year    Active Member of Clubs or Organizations: No    Attends Banker Meetings: Never    Marital Status: Married  Catering manager Violence: Low Risk  (10/11/2022)   [facility] visits prior to 10/19/2022., [facility] visits prior to 10/19/2022.   Safety    How often does anyone, including family and friends, physically hurt you?: Never    How often does anyone, including family and friends, insult or talk down to you?: Never    How often does anyone, including family and friends, threaten you with harm?: Never    How often does anyone, including family and friends, scream or curse at you?: Never    Outpatient Medications Prior to Visit  Medication Sig Dispense Refill   albuterol (PROVENTIL) (2.5 MG/3ML) 0.083% nebulizer solution Take 3 mLs (2.5 mg total) by nebulization every 6 (six) hours as needed for wheezing or shortness of breath. 75 mL 0   albuterol (VENTOLIN HFA) 108 (90 Base) MCG/ACT inhaler Inhale 2 puffs into the lungs every 6 (six) hours as needed for wheezing or shortness of breath. 8 g 3   Apple Cid Vn-Grn Tea-Bit Or-Cr (APPLE CIDER VINEGAR PLUS PO) Take by mouth.     BD PEN NEEDLE NANO 2ND GEN 32G X 4 MM MISC 1 EACH BY DOES  NOT APPLY ROUTE IN THE MORNING AND AT BEDTIME. 100 each 2   Budeson-Glycopyrrol-Formoterol (BREZTRI AEROSPHERE) 160-9-4.8 MCG/ACT AERO Inhale 2 puffs into the lungs in the morning and at bedtime. 10.7 g 3   buPROPion (WELLBUTRIN XL) 300 MG 24 hr tablet Once daily in am. 90 tablet 1   calcium carbonate (TUMS - DOSED IN MG ELEMENTAL CALCIUM) 500 MG chewable tablet Chew 2 tablets by mouth daily as needed for indigestion.      carvedilol (COREG) 6.25 MG tablet Take 1 tablet (6.25 mg total) by mouth 2 (two) times daily.  180 tablet 1   Continuous Blood Gluc Receiver (FREESTYLE LIBRE 14 DAY READER) DEVI USE TO CHECK BLOOD SUGARS DAILY     Continuous Blood Gluc Sensor (FREESTYLE LIBRE 14 DAY SENSOR) MISC SMARTSIG:1 Topical Every 2 Weeks     diltiazem (CARDIZEM CD) 300 MG 24 hr capsule TAKE 1 CAPSULE BY MOUTH EVERY DAY 90 capsule 0   fexofenadine (ALLEGRA) 180 MG tablet Take 180 mg by mouth daily as needed for allergies or rhinitis.     furosemide (LASIX) 80 MG tablet TAKE 1 TABLET BY MOUTH 2 TIMES DAILY. 180 tablet 0   hydrOXYzine (VISTARIL) 25 MG capsule TAKE 1 CAPSULE (25 MG TOTAL) BY MOUTH EVERY 8 (EIGHT) HOURS AS NEEDED FOR ANXIETY. 270 capsule 1   insulin degludec (TRESIBA FLEXTOUCH) 200 UNIT/ML FlexTouch Pen Inject 56 Units into the skin every evening. 27 mL 0   Insulin Pen Needle 31G X 8 MM MISC Inject 1 Units into the skin daily.     mirabegron ER (MYRBETRIQ) 25 MG TB24 tablet Take 1 tablet (25 mg total) by mouth daily. 30 tablet 1   omega-3 acid ethyl esters (LOVAZA) 1 g capsule TAKE 2 CAPSULES BY MOUTH 2 TIMES DAILY. 360 capsule 0   omeprazole (PRILOSEC) 40 MG capsule TAKE 1 CAPSULE(S) BY MOUTH DAILY IN THE MORNING 90 capsule 1   ondansetron (ZOFRAN) 4 MG tablet Take 1 tablet (4 mg total) by mouth every 8 (eight) hours as needed for nausea or vomiting. 20 tablet 0   pregabalin (LYRICA) 150 MG capsule Take 1 capsule (150 mg total) by mouth 2 (two) times daily. 60 capsule 0   rivaroxaban (XARELTO)  20 MG TABS tablet Take by mouth at bedtime.     rosuvastatin (CRESTOR) 40 MG tablet Take 1 tablet (40 mg total) by mouth daily. 90 tablet 0   Semaglutide, 1 MG/DOSE, 4 MG/3ML SOPN Inject 2 mg as directed once a week. 3 mL 2   traMADol (ULTRAM) 50 MG tablet Take 50 mg by mouth 2 (two) times daily as needed.     VITAMIN D-VITAMIN K PO Take 1 tablet by mouth every evening.     No facility-administered medications prior to visit.    Allergies  Allergen Reactions   Valsartan Swelling   Codeine Nausea Only    Review of Systems  Constitutional:  Positive for fatigue. Negative for appetite change and fever.  HENT:  Negative for congestion, ear pain, sinus pressure and sore throat.   Respiratory:  Positive for chest tightness and shortness of breath. Negative for cough and wheezing.   Cardiovascular:  Negative for chest pain and palpitations.  Gastrointestinal:  Positive for nausea. Negative for abdominal pain, constipation, diarrhea and vomiting.  Genitourinary:  Negative for dysuria and hematuria.  Musculoskeletal:  Negative for arthralgias, back pain, joint swelling and myalgias.  Skin:  Negative for rash.  Neurological:  Positive for dizziness. Negative for weakness and headaches.  Psychiatric/Behavioral:  Negative for dysphoric mood. The patient is not nervous/anxious.        Objective:    Physical Exam Vitals and nursing note reviewed.  Constitutional:      Appearance: She is obese.  HENT:     Head: Normocephalic and atraumatic.     Mouth/Throat:     Mouth: Mucous membranes are moist.  Cardiovascular:     Rate and Rhythm: Normal rate and regular rhythm.     Heart sounds: Normal heart sounds.  Pulmonary:     Effort: Pulmonary effort is normal.  Breath sounds: Normal breath sounds.  Musculoskeletal:        General: Normal range of motion.     Cervical back: Normal range of motion and neck supple.  Skin:    General: Skin is warm and dry.     Comments: Healed wound on  the dorsum of left foot,  Deformities of toes noted from arthritis.   Neurological:     General: No focal deficit present.     Mental Status: She is alert and oriented to person, place, and time.  Psychiatric:        Mood and Affect: Mood normal.     BP 110/80   Pulse 82   Temp 97.7 F (36.5 C) (Temporal)   Ht 5\' 3"  (1.6 m)   Wt 205 lb (93 kg)   SpO2 96%   BMI 36.31 kg/m  Wt Readings from Last 3 Encounters:  04/15/23 205 lb (93 kg)  03/20/23 218 lb (98.9 kg)  02/24/23 211 lb (95.7 kg)    Health Maintenance Due  Topic Date Due   OPHTHALMOLOGY EXAM  12/08/2021   COVID-19 Vaccine (4 - 2023-24 season) 04/19/2022   Medicare Annual Wellness (AWV)  04/05/2023   INFLUENZA VACCINE  03/20/2023    There are no preventive care reminders to display for this patient.   Lab Results  Component Value Date   TSH 3.630 07/15/2022   Lab Results  Component Value Date   WBC 7.4 02/24/2023   HGB 12.9 02/24/2023   HCT 40.8 02/24/2023   MCV 85 02/24/2023   PLT 276 02/24/2023   Lab Results  Component Value Date   NA 144 02/24/2023   K 4.3 02/24/2023   CO2 27 02/24/2023   GLUCOSE 114 (H) 02/24/2023   BUN 13 02/24/2023   CREATININE 0.87 02/24/2023   BILITOT 0.3 02/24/2023   ALKPHOS 75 02/24/2023   AST 16 02/24/2023   ALT 14 02/24/2023   PROT 6.7 02/24/2023   ALBUMIN 4.0 02/24/2023   CALCIUM 9.4 02/24/2023   ANIONGAP 14 08/10/2019   EGFR 69 02/24/2023   Lab Results  Component Value Date   CHOL 126 02/24/2023   Lab Results  Component Value Date   HDL 41 02/24/2023   Lab Results  Component Value Date   LDLCALC 61 02/24/2023   Lab Results  Component Value Date   TRIG 135 02/24/2023   Lab Results  Component Value Date   CHOLHDL 3.1 02/24/2023   Lab Results  Component Value Date   HGBA1C 8.5 (H) 02/24/2023         Assessment & Plan:  Sensation of chest tightness Assessment & Plan: Not reproducible. Not associated with exertion. Normal cardiopulmonary  exam today Normal vitals and O2 sats.  PLAN: as above.  To the ED for any worsening symptoms   Orders: -     CBC with Differential/Platelet -     Comprehensive metabolic panel -     Sedimentation rate -     DG Chest 2 View; Future -     For home use only DME Nebulizer machine  Dizziness Assessment & Plan: Unclear etiology Good Bps  Advised to watch out Bps, take time changing positions Will check basic blood work panel to rule out electrolyte abnormalities/ infection.   Orders: -     CBC with Differential/Platelet -     Comprehensive metabolic panel -     Sedimentation rate  Moderate persistent asthma without complication Assessment & Plan: Moderate persistent asthma, but does  not appear to be in exacerbation today Normal exam, reassuring   PLAN: Will order chest x ray to rule out pneumonia/ pleural effusion as she reports history of it - sent nebulizer machine to her pharmacy - continue current inhalers  - Report back if any worsening symptoms or call 911 for any severe symptoms   Orders: -     CBC with Differential/Platelet -     Comprehensive metabolic panel -     Sedimentation rate -     DG Chest 2 View; Future -     For home use only DME Nebulizer machine       I,Lauren M Auman,acting as a scribe for [employer], MD.,have documented all relevant documentation on the behalf of Caitlin Moment, MD,as directed by  Caitlin Moment, MD while in the presence of Caitlin Moment, MD.   Caitlin Moment, MD

## 2023-04-15 NOTE — Assessment & Plan Note (Signed)
Unclear etiology Good Bps  Advised to watch out Bps, take time changing positions Will check basic blood work panel to rule out electrolyte abnormalities/ infection.

## 2023-04-15 NOTE — Assessment & Plan Note (Signed)
Not reproducible. Not associated with exertion. Normal cardiopulmonary exam today Normal vitals and O2 sats.  PLAN: as above.  To the ED for any worsening symptoms

## 2023-04-15 NOTE — Assessment & Plan Note (Signed)
Moderate persistent asthma, but does not appear to be in exacerbation today Normal exam, reassuring   PLAN: Will order chest x ray to rule out pneumonia/ pleural effusion as she reports history of it - sent nebulizer machine to her pharmacy - continue current inhalers  - Report back if any worsening symptoms or call 911 for any severe symptoms

## 2023-04-16 LAB — CBC WITH DIFFERENTIAL/PLATELET
Basophils Absolute: 0.1 10*3/uL (ref 0.0–0.2)
Basos: 1 %
EOS (ABSOLUTE): 0.1 10*3/uL (ref 0.0–0.4)
Eos: 1 %
Hematocrit: 44.3 % (ref 34.0–46.6)
Hemoglobin: 14.4 g/dL (ref 11.1–15.9)
Immature Grans (Abs): 0 10*3/uL (ref 0.0–0.1)
Immature Granulocytes: 0 %
Lymphocytes Absolute: 2.9 10*3/uL (ref 0.7–3.1)
Lymphs: 32 %
MCH: 27.5 pg (ref 26.6–33.0)
MCHC: 32.5 g/dL (ref 31.5–35.7)
MCV: 85 fL (ref 79–97)
Monocytes Absolute: 0.9 10*3/uL (ref 0.1–0.9)
Monocytes: 10 %
Neutrophils Absolute: 5 10*3/uL (ref 1.4–7.0)
Neutrophils: 56 %
Platelets: 307 10*3/uL (ref 150–450)
RBC: 5.23 x10E6/uL (ref 3.77–5.28)
RDW: 15.7 % — ABNORMAL HIGH (ref 11.7–15.4)
WBC: 9.1 10*3/uL (ref 3.4–10.8)

## 2023-04-16 LAB — COMPREHENSIVE METABOLIC PANEL
ALT: 17 IU/L (ref 0–32)
AST: 17 IU/L (ref 0–40)
Albumin: 4.7 g/dL (ref 3.8–4.8)
Alkaline Phosphatase: 80 IU/L (ref 44–121)
BUN/Creatinine Ratio: 15 (ref 12–28)
BUN: 15 mg/dL (ref 8–27)
Bilirubin Total: 0.3 mg/dL (ref 0.0–1.2)
CO2: 30 mmol/L — ABNORMAL HIGH (ref 20–29)
Calcium: 10.2 mg/dL (ref 8.7–10.3)
Chloride: 98 mmol/L (ref 96–106)
Creatinine, Ser: 0.99 mg/dL (ref 0.57–1.00)
Globulin, Total: 2.8 g/dL (ref 1.5–4.5)
Glucose: 126 mg/dL — ABNORMAL HIGH (ref 70–99)
Potassium: 4.9 mmol/L (ref 3.5–5.2)
Sodium: 142 mmol/L (ref 134–144)
Total Protein: 7.5 g/dL (ref 6.0–8.5)
eGFR: 59 mL/min/{1.73_m2} — ABNORMAL LOW (ref >59)

## 2023-04-16 LAB — SEDIMENTATION RATE: Sed Rate: 37 mm/h (ref 0–40)

## 2023-05-04 ENCOUNTER — Other Ambulatory Visit: Payer: Self-pay | Admitting: Family Medicine

## 2023-06-01 ENCOUNTER — Other Ambulatory Visit: Payer: Self-pay | Admitting: Family Medicine

## 2023-06-01 DIAGNOSIS — E1159 Type 2 diabetes mellitus with other circulatory complications: Secondary | ICD-10-CM

## 2023-06-10 NOTE — Assessment & Plan Note (Addendum)
Well controlled.  Continue to work on eating a healthy diet and exercise.  Labs drawn today.   No major side effects reported, and no issues with compliance. The current medical regimen is effective;  continue present plan with Rosuvastatin 40mg  daily, lovaza 2g twice a day. Will adjust medication as needed depending on labs

## 2023-06-10 NOTE — Progress Notes (Unsigned)
Subjective:  Patient ID: Caitlin [name], female    DOB: [birth year]  Age: 77 y.o. MRN: [id number]  Chief Complaint  Patient presents with   Medical Management of Chronic Issues    HPI Patient in office today for follow up for diabetes, hypertension, and hyperlipidemia.   Diabetes:  Complications: neuropathy Glucose checking: CGM Glucose logs:150-225 Hypoglycemia: no Most recent A1C: 8.5% Current medications: tresiba 56 U daily. Quit ozempic 1 mg weekly about 3 weeks ago due to constipation. ON lyrica 100 mg one at noon and 2 at night.   Foot checks: daily   Hyperlipidemia: Current medications: crestor 40 mg daily. Vascepa 1 gm 2 capsules twice daily.    Hypertension: Current medications: Carvedilol 6.25 mg twice a day, Lasix 80 mg daily, diltiazem 300 mg every day.   Atrial fibrillation: on carvedilol and xarelto.    Asthma: Has not take breztri. She forgets. Uses albuterol sporadically.      06/11/2023    8:09 AM 02/24/2023    7:35 AM 10/22/2022   11:03 AM 04/04/2022   11:30 AM 02/26/2022   11:17 AM  Depression screen PHQ 2/9  Decreased Interest 1 0 0 0 0  Down, Depressed, Hopeless 2 0 0 0 0  PHQ - 2 Score 3 0 0 0 0  Altered sleeping 2 0 1    Tired, decreased energy 2 0 1    Change in appetite 2 0 0    Feeling bad or failure about yourself  0 0 0    Trouble concentrating 1 0 0    Moving slowly or fidgety/restless 1 0 0    Suicidal thoughts 0 0 0    PHQ-9 Score 11 0 2    Difficult doing work/chores Somewhat difficult Not difficult at all Not difficult at all          06/11/2023    8:09 AM  Fall Risk   Falls in the past year? 0  Number falls in past yr: 0  Injury with Fall? 0  Risk for fall due to : No Fall Risks  Follow up Falls evaluation completed    Patient Care Team: Blane Ohara, MD as PCP - General (Family Medicine) Ocie Cornfield, MD as Referring Physician (Internal Medicine) Levert Feinstein, MD as Consulting Physician (Neurology) Baldo Daub, MD as  Consulting Physician (Cardiology) Annice Pih., MD (Urology) Arman Bogus, MD as Consulting Physician (Neurosurgery) Margarette Canada., DPM (Podiatry)   Review of Systems  Constitutional:  Negative for chills, fatigue and fever.  HENT:  Negative for congestion, ear pain, rhinorrhea and sore throat.   Respiratory:  Negative for cough and shortness of breath.   Cardiovascular:  Negative for chest pain.  Gastrointestinal:  Positive for constipation. Negative for abdominal pain, diarrhea, nausea and vomiting.  Genitourinary:  Negative for dysuria and urgency.  Musculoskeletal:  Negative for back pain and myalgias.  Neurological:  Negative for dizziness, weakness, light-headedness and headaches.  Psychiatric/Behavioral:  Negative for dysphoric mood. The patient is not nervous/anxious.     Current Outpatient Medications on File Prior to Visit  Medication Sig Dispense Refill   albuterol (PROVENTIL) (2.5 MG/3ML) 0.083% nebulizer solution Take 3 mLs (2.5 mg total) by nebulization every 6 (six) hours as needed for wheezing or shortness of breath. 75 mL 0   albuterol (VENTOLIN HFA) 108 (90 Base) MCG/ACT inhaler Inhale 2 puffs into the lungs every 6 (six) hours as needed for wheezing or shortness of breath. 8 g 3  Apple Cid Vn-Grn Tea-Bit Or-Cr (APPLE CIDER VINEGAR PLUS PO) Take by mouth.     BD PEN NEEDLE NANO 2ND GEN 32G X 4 MM MISC 1 EACH BY DOES NOT APPLY ROUTE IN THE MORNING AND AT BEDTIME. 100 each 2   Budeson-Glycopyrrol-Formoterol (BREZTRI AEROSPHERE) 160-9-4.8 MCG/ACT AERO Inhale 2 puffs into the lungs in the morning and at bedtime. 10.7 g 3   buPROPion (WELLBUTRIN XL) 300 MG 24 hr tablet TAKE 1 TABLET BY MOUTH EVERY DAY IN THE MORNING 90 tablet 1   calcium carbonate (TUMS - DOSED IN MG ELEMENTAL CALCIUM) 500 MG chewable tablet Chew 2 tablets by mouth daily as needed for indigestion.      carvedilol (COREG) 6.25 MG tablet TAKE 1 TABLET BY MOUTH TWICE A DAY 180 tablet 1    Continuous Blood Gluc Receiver (FREESTYLE LIBRE 14 DAY READER) DEVI USE TO CHECK BLOOD SUGARS DAILY     Continuous Blood Gluc Sensor (FREESTYLE LIBRE 14 DAY SENSOR) MISC SMARTSIG:1 Topical Every 2 Weeks     diltiazem (CARDIZEM CD) 300 MG 24 hr capsule TAKE 1 CAPSULE BY MOUTH EVERY DAY 90 capsule 0   fexofenadine (ALLEGRA) 180 MG tablet Take 180 mg by mouth daily as needed for allergies or rhinitis.     furosemide (LASIX) 80 MG tablet TAKE 1 TABLET BY MOUTH 2 TIMES DAILY. 180 tablet 0   hydrOXYzine (VISTARIL) 25 MG capsule TAKE 1 CAPSULE (25 MG TOTAL) BY MOUTH EVERY 8 (EIGHT) HOURS AS NEEDED FOR ANXIETY. 270 capsule 1   insulin degludec (TRESIBA FLEXTOUCH) 200 UNIT/ML FlexTouch Pen Inject 56 Units into the skin every evening. 27 mL 0   Insulin Pen Needle 31G X 8 MM MISC Inject 1 Units into the skin daily.     mirabegron ER (MYRBETRIQ) 25 MG TB24 tablet Take 1 tablet (25 mg total) by mouth daily. 30 tablet 1   omega-3 acid ethyl esters (LOVAZA) 1 g capsule TAKE 2 CAPSULES BY MOUTH 2 TIMES DAILY. 360 capsule 0   rivaroxaban (XARELTO) 20 MG TABS tablet Take by mouth at bedtime.     Semaglutide, 1 MG/DOSE, 4 MG/3ML SOPN Inject 2 mg as directed once a week. 3 mL 2   VITAMIN D-VITAMIN K PO Take 1 tablet by mouth every evening.     No current facility-administered medications on file prior to visit.   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) 12/13/2019   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) 12/13/2019    Morbid obesity (HCC) 12/13/2019   Obstructive sleep apnea    PONV (postoperative nausea and vomiting)    "not the last few times"   RLS (restless legs syndrome)    S/P ablation of atrial flutter 02/12/2016   S/P lumbar fusion 08/04/2019   Sleep apnea    Upper respiratory tract infection due to COVID-19 virus 08/14/2021   Past Surgical History:  Procedure Laterality Date   ATRIAL FIBRILLATION ABLATION     x3  CATARACT EXTRACTION Bilateral    COLONOSCOPY  01/04/2013   Moderate predominantly sigmoid diverticulosis. Small internal hemorrhoids. Otherwise normal colonosopy.    LUMBAR WOUND DEBRIDEMENT N/A 08/09/2019   Procedure: LUMBAR WOUND DEBRIDEMENT Evacuation of  EPIDURAL HEMATOMA;  Surgeon: Tressie Stalker, MD;  Location[facility] OR;  Service: Neurosurgery;  Laterality: N/A;    Family History  Problem Relation Age of Onset   Breast cancer Mother 64   Heart attack Father 73   Mental illness Sister    Heart Problems Brother 55   Cancer Brother    Breast cancer Paternal Grandmother    Colon cancer Paternal Aunt 51   Social History   Socioeconomic History   Marital status: Married    Spouse name: Onalee Hua   Number of children: Not on file   Years of education: Not on file   Highest education level: Not on file  Occupational History   Occupation: Retired Pensions consultant  Tobacco Use   Smoking status: Never    Passive exposure: Past   Smokeless tobacco: Never  Vaping Use   Vaping status: Never Used  Substance and Sexual Activity   Alcohol use: Not Currently   Drug use: Never   Sexual activity: Not Currently  Other Topics Concern   Not on file  Social History Narrative   Not on file   Social Determinants of Health   Financial Resource Strain: Low Risk  (04/04/2022)   Overall Financial Resource Strain (CARDIA)    Difficulty of Paying Living Expenses: Not hard at all  Food Insecurity: Low Risk  (10/11/2022)   [facility]   Hunger Vital  Sign    Worried About Running Out of Food in the Last Year: Never true    Within the past 12 months, the food you bought just didn't last and you didn't have money to get more: Not on file  Transportation Needs: No Transportation Needs (10/11/2022)   [facility]   Transportation    In the past 12 months, has lack of reliable transportation kept you from medical appointments, meetings, work or from getting things needed for daily living? : No  Physical Activity: Inactive (04/04/2022)   Exercise Vital Sign    Days of Exercise per Week: 0 days    Minutes of Exercise per Session: 0 min  Stress: No Stress Concern Present (04/04/2022)   [employer] of Occupational Health - Occupational Stress Questionnaire    Feeling of Stress : Not at all  Social Connections: Moderately Integrated (11/06/2022)   Social Connection and Isolation Panel [NHANES]    Frequency of Communication with Friends and Family: Twice a week    Frequency of Social Gatherings with Friends and Family: Twice a week    Attends Religious Services: More than 4 times per year    Active Member of [employer] or Organizations: No    Attends Engineer, structural: Never    Marital Status: Married    Objective:  BP 136/86   Pulse 84   Temp 97.6 F (36.4 C)   Ht 5\' 3"  (1.6 m)   Wt 209 lb (94.8 kg)   SpO2 93%   BMI 37.02 kg/m      06/11/2023    8:05 AM 04/15/2023    4:41 PM 04/15/2023    4:04 PM  BP/Weight  Systolic BP 136 110 142  Diastolic BP 86 80 94  Wt. (Lbs) 209  205  BMI 37.02 kg/m2  36.31 kg/m2  Physical Exam Vitals reviewed.  Constitutional:      Appearance: Normal appearance. She is obese.  Neck:     Vascular: No carotid bruit.  Cardiovascular:     Rate and Rhythm: Normal rate and regular rhythm.     Heart sounds: Normal heart sounds.  Pulmonary:     Effort: Pulmonary effort is normal. No respiratory distress.     Breath sounds: Normal breath sounds.  Abdominal:      General: Abdomen is flat. Bowel sounds are normal.     Palpations: Abdomen is soft.     Tenderness: There is no abdominal tenderness.  Neurological:     Mental Status: She is alert and oriented to person, place, and time.  Psychiatric:        Mood and Affect: Mood normal.        Behavior: Behavior normal.       06/11/2023    8:26 AM  MMSE - Mini Mental State Exam  Orientation to time 3  Orientation to time comments Year and day of the week incorrect  Orientation to Place 5  Registration 3  Attention/ Calculation 5  Recall 3  Language- name 2 objects 2  Language- repeat 1  Language- follow 3 step command 1  Language- read & follow direction 1  Write a sentence 1  Copy design 1  Total score 26        04/04/2022   11:36 AM  6CIT Screen  What Year? 0 points  What month? 0 points  What time? 0 points  Count back from 20 0 points  Months in reverse 2 points  Repeat phrase 0 points  Total Score 2 points           Diabetic Foot Exam - Simple   Simple Foot Form Diabetic Foot exam was performed with the following findings: Yes 06/11/2023  9:04 AM  Visual Inspection See comments: Yes Sensation Testing Intact to touch and monofilament testing bilaterally: Yes Pulse Check Posterior Tibialis and Dorsalis pulse intact bilaterally: Yes Comments Right foot large bunion. Callus.       Lab Results  Component Value Date   WBC 9.1 04/15/2023   HGB 14.4 04/15/2023   HCT 44.3 04/15/2023   PLT 307 04/15/2023   GLUCOSE 126 (H) 04/15/2023   CHOL 126 02/24/2023   TRIG 135 02/24/2023   HDL 41 02/24/2023   LDLCALC 61 02/24/2023   ALT 17 04/15/2023   AST 17 04/15/2023   NA 142 04/15/2023   K 4.9 04/15/2023   CL 98 04/15/2023   CREATININE 0.99 04/15/2023   BUN 15 04/15/2023   CO2 30 (H) 04/15/2023   TSH 3.630 07/15/2022   INR 0.9 08/04/2019   HGBA1C 8.5 (H) 02/24/2023      Assessment & Plan:    Hypertension associated with diabetes (HCC) Assessment &  Plan: Well controlled.  Continue to work on eating a healthy diet and exercise.  Labs drawn today.  The current medical regimen is effective;  continue present plan with Carvedilol 6.5mg      Orders: -     Comprehensive metabolic panel -     Ambulatory referral to Podiatry  Gastroesophageal reflux disease without esophagitis Assessment & Plan: The current medical regimen is effective;  continue present plan and medications.   Orders: -     Omeprazole; Take 1 capsule (40 mg total) by mouth in the morning and at bedtime.  Dispense: 180 capsule; Refill: 1  Diabetic peripheral neuropathy associated with type  2 diabetes mellitus (HCC) Assessment & Plan: Control: poor Recommend check sugars fasting daily. Recommend check feet daily. Recommend annual eye exams. Medicines: Tresiba 56 units every evening, Semaglutide 2 mg weekly. Stop pregabalin. Start gabapentin 300 mg three times a day.  Continue to work on eating a healthy diet and exercise.  Labs drawn today.     Orders: -     Hemoglobin A1c -     Microalbumin / creatinine urine ratio  Mixed hyperlipidemia Assessment & Plan: Well controlled.  Continue to work on eating a healthy diet and exercise.  Labs drawn today.   No major side effects reported, and no issues with compliance. The current medical regimen is effective;  continue present plan with Rosuvastatin 40mg  daily, lovaza 2g twice a day. Will adjust medication as needed depending on labs   Orders: -     Lipid panel -     Rosuvastatin Calcium; Take 1 tablet (40 mg total) by mouth daily.  Restless leg syndrome Assessment & Plan: Continue lyrica.    Encounter for immunization -     Flu Vaccine Trivalent High Dose (Fluad)  Memory loss or impairment Assessment & Plan: Check labs  Orders: -     B12 and Folate Panel -     Methylmalonic acid, serum -     TSH -     CBC with Differential/Platelet  Cervicalgia Assessment & Plan: Ordering MRI     Orders: -     MR CERVICAL SPINE WO CONTRAST; Future -     traMADol HCl; Take 1 tablet (50 mg total) by mouth 2 (two) times daily as needed for severe pain (pain score 7-10).  Dispense: 60 tablet; Refill: 2  Cervical radiculopathy Assessment & Plan:  Ordering MRI   Orders: -     MR CERVICAL SPINE WO CONTRAST; Future -     Gabapentin; Take 1 capsule (300 mg total) by mouth 3 (three) times daily.  Dispense: 90 capsule; Refill: 2 -     traMADol HCl; Take 1 tablet (50 mg total) by mouth 2 (two) times daily as needed for severe pain (pain score 7-10).  Dispense: 60 tablet; Refill: 2  Bunion of great toe of right foot Assessment & Plan: Referring to podiatry.    Moderate persistent asthma without complication Assessment & Plan: Not controlled.  Strongly encouraged patient to take Breztri 2 puffs twice daily as this is her maintenance medicine.         Meds ordered this encounter  Medications   rosuvastatin (CRESTOR) 40 MG tablet    Sig: Take 1 tablet (40 mg total) by mouth daily.   omeprazole (PRILOSEC) 40 MG capsule    Sig: Take 1 capsule (40 mg total) by mouth in the morning and at bedtime.    Dispense:  180 capsule    Refill:  1   gabapentin (NEURONTIN) 300 MG capsule    Sig: Take 1 capsule (300 mg total) by mouth 3 (three) times daily.    Dispense:  90 capsule    Refill:  2   traMADol (ULTRAM) 50 MG tablet    Sig: Take 1 tablet (50 mg total) by mouth 2 (two) times daily as needed for severe pain (pain score 7-10).    Dispense:  60 tablet    Refill:  2    Orders Placed This Encounter  Procedures   MR CERVICAL SPINE WO CONTRAST   Flu Vaccine Trivalent High Dose (Fluad)   Comprehensive metabolic panel   Hemoglobin  A1c   Lipid panel   B12 and Folate Panel   Methylmalonic acid, serum   TSH   CBC with Differential/Platelet   Microalbumin / creatinine urine ratio   Ambulatory referral to Podiatry     Follow-up: No follow-ups on file.   I,Marla I  Leal-Borjas,acting as a scribe for Blane Ohara, MD.,have documented all relevant documentation on the behalf of Blane Ohara, MD,as directed by  Blane Ohara, MD while in the presence of Blane Ohara, MD.   An After Visit Summary was printed and given to the patient.  Total time spent on today's visit was greater than 40 minutes, including both face-to-face time and nonface-to-face time personally spent on review of chart (labs and imaging), discussing labs and goals, discussing further work-up, treatment options, referrals to specialist if needed, reviewing outside records of pertinent, answering patient's questions, and coordinating care.  Blane Ohara, MD [facility] [phone]

## 2023-06-10 NOTE — Assessment & Plan Note (Signed)
The current medical regimen is effective;  continue present plan and medications.  

## 2023-06-10 NOTE — Assessment & Plan Note (Signed)
Well controlled.  Continue to work on eating a healthy diet and exercise.  Labs drawn today.  The current medical regimen is effective;  continue present plan with Carvedilol 6.5mg 

## 2023-06-10 NOTE — Assessment & Plan Note (Signed)
Continue lyrica 

## 2023-06-10 NOTE — Assessment & Plan Note (Addendum)
Control: poor Recommend check sugars fasting daily. Recommend check feet daily. Recommend annual eye exams. Medicines: Tresiba 56 units every evening, Semaglutide 2 mg weekly. Stop pregabalin. Start gabapentin 300 mg three times a day.  Continue to work on eating a healthy diet and exercise.  Labs drawn today.

## 2023-06-11 ENCOUNTER — Telehealth: Payer: Self-pay | Admitting: Family Medicine

## 2023-06-11 ENCOUNTER — Encounter: Payer: Self-pay | Admitting: Family Medicine

## 2023-06-11 ENCOUNTER — Other Ambulatory Visit: Payer: Self-pay

## 2023-06-11 ENCOUNTER — Ambulatory Visit (INDEPENDENT_AMBULATORY_CARE_PROVIDER_SITE_OTHER): Payer: Medicare Other | Admitting: Family Medicine

## 2023-06-11 VITALS — BP 136/86 | HR 84 | Temp 97.6°F | Ht 63.0 in | Wt 209.0 lb

## 2023-06-11 DIAGNOSIS — I152 Hypertension secondary to endocrine disorders: Secondary | ICD-10-CM

## 2023-06-11 DIAGNOSIS — R413 Other amnesia: Secondary | ICD-10-CM

## 2023-06-11 DIAGNOSIS — M542 Cervicalgia: Secondary | ICD-10-CM

## 2023-06-11 DIAGNOSIS — Z23 Encounter for immunization: Secondary | ICD-10-CM

## 2023-06-11 DIAGNOSIS — G2581 Restless legs syndrome: Secondary | ICD-10-CM

## 2023-06-11 DIAGNOSIS — E1142 Type 2 diabetes mellitus with diabetic polyneuropathy: Secondary | ICD-10-CM

## 2023-06-11 DIAGNOSIS — E782 Mixed hyperlipidemia: Secondary | ICD-10-CM

## 2023-06-11 DIAGNOSIS — K219 Gastro-esophageal reflux disease without esophagitis: Secondary | ICD-10-CM | POA: Diagnosis not present

## 2023-06-11 DIAGNOSIS — M5412 Radiculopathy, cervical region: Secondary | ICD-10-CM

## 2023-06-11 DIAGNOSIS — M21611 Bunion of right foot: Secondary | ICD-10-CM

## 2023-06-11 DIAGNOSIS — R110 Nausea: Secondary | ICD-10-CM

## 2023-06-11 DIAGNOSIS — E1159 Type 2 diabetes mellitus with other circulatory complications: Secondary | ICD-10-CM

## 2023-06-11 DIAGNOSIS — J4540 Moderate persistent asthma, uncomplicated: Secondary | ICD-10-CM

## 2023-06-11 MED ORDER — ONDANSETRON HCL 4 MG PO TABS: 4.0000 mg | ORAL_TABLET | Freq: Three times a day (TID) | ORAL | 0 refills | Status: AC | PRN

## 2023-06-11 MED ORDER — ROSUVASTATIN CALCIUM 40 MG PO TABS: 40.0000 mg | ORAL_TABLET | Freq: Every day | ORAL | Status: DC

## 2023-06-11 MED ORDER — TRAMADOL HCL 50 MG PO TABS: 50.0000 mg | ORAL_TABLET | Freq: Two times a day (BID) | ORAL | 2 refills | Status: DC | PRN

## 2023-06-11 MED ORDER — GABAPENTIN 300 MG PO CAPS: 300.0000 mg | ORAL_CAPSULE | Freq: Three times a day (TID) | ORAL | 2 refills | Status: DC

## 2023-06-11 MED ORDER — OMEPRAZOLE 40 MG PO CPDR: 40.0000 mg | DELAYED_RELEASE_CAPSULE | Freq: Two times a day (BID) | ORAL | 1 refills | Status: DC

## 2023-06-11 NOTE — Telephone Encounter (Signed)
Prescription Request  06/11/2023  LOV: 06/11/2023  What is the name of the medication or equipment? ondansetron (ZOFRAN) 4 MG tablet   Have you contacted your pharmacy to request a refill? No   Which pharmacy would you like this sent to?    [id number] - [place], [place] - [address] [place] [address] Phone: [phone] Fax: [phone]   Patient notified that their request is being sent to the clinical staff for review and that they should receive a response within 2 business days.   Please advise at [facility] [phone]

## 2023-06-11 NOTE — Patient Instructions (Addendum)
Stop pregabalin. Start gabapentin 300 mg three times a day.  Take Breztri 2 puffs twice daily.  Ordering MRI of neck. Referring to podiatry.  Referring to our pharmacist to help with medications.

## 2023-06-12 ENCOUNTER — Other Ambulatory Visit: Payer: Medicare Other | Admitting: Pharmacist

## 2023-06-12 LAB — MICROALBUMIN / CREATININE URINE RATIO
Creatinine, Urine: 81.2 mg/dL
Microalb/Creat Ratio: 29 mg/g{creat} (ref 0–29)
Microalbumin, Urine: 23.2 ug/mL

## 2023-06-12 NOTE — Progress Notes (Signed)
06/13/2023 Name: Caitlin [name] MRN: [id number] DOB: May 17, 1946  Chief Complaint  Patient presents with   Medication Assistance    Caitlin [name] is a 77 y.o. year old female who presented for a telephone visit.   They were referred to the pharmacist by their PCP for assistance in managing complex medication management. - PCP requested adherence packaging  Subjective:  Care Team: Primary Care Provider: Blane Ohara, MD   Medication Access/Adherence  Current Pharmacy[facility]  [phone] - [place], [place] - [address] [place] [address] Phone: [phone] Fax: [phone]  [phone] - [place], [place] - [address] [place] [address] Phone: [phone] Fax: [phone]    Medication Management:  Current adherence strategy: to be discussed at future visits   Patient reports Good adherence to medications  Patient reports the following barriers to adherence: feels overburdened by amount of medications   Objective:  Lab Results  Component Value Date   HGBA1C 8.5 (H) 02/24/2023    Lab Results  Component Value Date   CREATININE 0.99 04/15/2023   BUN 15 04/15/2023   NA 142 04/15/2023   K 4.9 04/15/2023   CL 98 04/15/2023   CO2 30 (H) 04/15/2023    Lab Results  Component Value Date   CHOL 126 02/24/2023   HDL 41 02/24/2023   LDLCALC 61 02/24/2023   TRIG 135 02/24/2023   CHOLHDL 3.1 02/24/2023    Medications Reviewed Today     Reviewed by Gabriel Carina, RPH (Pharmacist) on 06/13/23 at 1505  Med List Status: <None>   Medication Order Taking? Sig Documenting Provider Last Dose Status Informant  albuterol (PROVENTIL) (2.5 MG/3ML) 0.083% nebulizer solution [id number] No Take 3 mLs (2.5 mg total) by nebulization every 6 (six) hours as needed for wheezing or shortness of breath. Caitlin Morning, NP Taking Active   albuterol (VENTOLIN HFA) 108 (90 Base) MCG/ACT inhaler [id number] No  Inhale 2 puffs into the lungs every 6 (six) hours as needed for wheezing or shortness of breath. CoxFritzi Mandes, MD Taking Active   Apple Cid Vn-Grn Tea-Bit Or-Cr (APPLE CIDER VINEGAR PLUS PO) [id number] No Take by mouth. [provider] Taking Active   BD PEN NEEDLE NANO 2ND GEN 32G X 4 MM MISC [id number] No 1 EACH BY DOES NOT APPLY ROUTE IN THE [name] AND AT BEDTIME. Cox, Kirsten, MD Taking Active   Budeson-Glycopyrrol-Formoterol (BREZTRI AEROSPHERE) 160-9-4.8 MCG/ACT AERO [id number] No Inhale 2 puffs into the lungs in the [name] and at bedtime. Cox, Kirsten, MD Taking Active   buPROPion (WELLBUTRIN XL) 300 MG 24 hr tablet [id number]  TAKE 1 TABLET BY MOUTH EVERY DAY IN THE [name] Cox, Kirsten, MD  Active   calcium carbonate (TUMS - DOSED IN MG ELEMENTAL CALCIUM) 500 MG chewable tablet [id number] No Chew 2 tablets by mouth daily as needed for indigestion.  [provider] Taking Active Self  carvedilol (COREG) 6.25 MG tablet [id number]  TAKE 1 TABLET BY MOUTH TWICE A DAY Cox, Kirsten, MD  Active   Continuous Blood Gluc Receiver (FREESTYLE LIBRE 14 DAY READER) DEVI [id number] No USE TO CHECK BLOOD SUGARS DAILY [provider] Taking Active   Continuous Blood Gluc Sensor (FREESTYLE LIBRE 14 DAY SENSOR) MISC [id number] No SMARTSIG:1 Topical Every 2 Weeks [provider] Taking Active   diltiazem (CARDIZEM CD) 300 MG 24 hr capsule [id number] No TAKE 1 CAPSULE BY MOUTH EVERY DAY Cox, Kirsten, MD Taking Active   fexofenadine (  ALLEGRA) 180 MG tablet [id number] No Take 180 mg by mouth daily as needed for allergies or rhinitis. [provider] Taking Active Self  furosemide (LASIX) 80 MG tablet [id number] No TAKE 1 TABLET BY MOUTH 2 TIMES DAILY. Cox, Kirsten, MD Taking Active   gabapentin (NEURONTIN) 300 MG capsule [id number]  Take 1 capsule (300 mg total) by mouth 3 (three) times daily. Cox, Kirsten, MD  Active   hydrOXYzine (VISTARIL) 25 MG capsule [id number] No TAKE 1  CAPSULE (25 MG TOTAL) BY MOUTH EVERY 8 (EIGHT) HOURS AS NEEDED FOR ANXIETY. Caitlin Morning, NP Taking Active   insulin degludec (TRESIBA FLEXTOUCH) 200 UNIT/ML FlexTouch Pen [id number] No Inject 56 Units into the skin every evening. Caitlin Del, FNP Taking Active   Insulin Pen Needle 31G X 8 MM MISC [id number] No Inject 1 Units into the skin daily. [provider] Taking Active Self  mirabegron ER (MYRBETRIQ) 25 MG TB24 tablet [id number] No Take 1 tablet (25 mg total) by mouth daily. Caitlin [name], Caitlin [name] Taking Active   omega-3 acid ethyl esters (LOVAZA) 1 g capsule [id number] No TAKE 2 CAPSULES BY MOUTH 2 TIMES DAILY. Cox, Kirsten, MD Taking Active   omeprazole (PRILOSEC) 40 MG capsule [id number]  Take 1 capsule (40 mg total) by mouth in the [name] and at bedtime. Cox, Kirsten, MD  Active   ondansetron [facility]) 4 MG tablet [id number]  Take 1 tablet (4 mg total) by mouth every 8 (eight) hours as needed for nausea or vomiting. Cox, Kirsten, MD  Active   rivaroxaban (XARELTO) 20 MG TABS tablet [id number] No Take by mouth at bedtime. [provider] Taking Active   rosuvastatin (CRESTOR) 40 MG tablet [id number]  Take 1 tablet (40 mg total) by mouth daily. Cox, Kirsten, MD  Active   Semaglutide, 1 MG/DOSE, 4 MG/3ML SOPN [id number] No Inject 2 mg as directed once a week. Cox, Kirsten, MD Taking Active   traMADol (ULTRAM) 50 MG tablet [id number]  Take 1 tablet (50 mg total) by mouth 2 (two) times daily as needed for severe pain (pain score 7-10). Blane Ohara, MD  Active   VITAMIN D-VITAMIN K PO [id number] No Take 1 tablet by mouth every evening. [provider] Taking Active Self              Assessment/Plan:   Medication Management: - Currently strategy sufficient to maintain appropriate adherence to prescribed medication regimen - though patient is desiring to reduce pill burden - Discussed collaboration with local pharmacies for adherence packaging. Reviewed local pharmacies  with adherence packaging options. Patient elects to wait on this decision and think about it some more prior to making any changes. In meantime, she wants to plan a day/time to do an in-depth review of medications to identify any opportunity to reduce pill burden/polypharmacy.    Follow Up Plan: 1 week for medication review  Lynnda Shields, PharmD, BCPS Clinical Pharmacist [facility]

## 2023-06-14 DIAGNOSIS — M5412 Radiculopathy, cervical region: Secondary | ICD-10-CM | POA: Insufficient documentation

## 2023-06-14 DIAGNOSIS — R413 Other amnesia: Secondary | ICD-10-CM | POA: Insufficient documentation

## 2023-06-14 NOTE — Assessment & Plan Note (Signed)
Check labs 

## 2023-06-14 NOTE — Assessment & Plan Note (Signed)
Ordering MRI ?

## 2023-06-14 NOTE — Assessment & Plan Note (Signed)
Take Breztri 2 puffs twice daily.

## 2023-06-14 NOTE — Assessment & Plan Note (Signed)
Referring to podiatry

## 2023-06-15 DIAGNOSIS — Z23 Encounter for immunization: Secondary | ICD-10-CM | POA: Insufficient documentation

## 2023-06-17 ENCOUNTER — Other Ambulatory Visit: Payer: Self-pay

## 2023-06-17 DIAGNOSIS — E1140 Type 2 diabetes mellitus with diabetic neuropathy, unspecified: Secondary | ICD-10-CM

## 2023-06-17 LAB — COMPREHENSIVE METABOLIC PANEL
ALT: 18 [IU]/L (ref 0–32)
AST: 18 [IU]/L (ref 0–40)
Albumin: 4.5 g/dL (ref 3.8–4.8)
Alkaline Phosphatase: 97 [IU]/L (ref 44–121)
BUN/Creatinine Ratio: 18 (ref 12–28)
BUN: 16 mg/dL (ref 8–27)
Bilirubin Total: 0.2 mg/dL (ref 0.0–1.2)
CO2: 22 mmol/L (ref 20–29)
Calcium: 9.7 mg/dL (ref 8.7–10.3)
Chloride: 103 mmol/L (ref 96–106)
Creatinine, Ser: 0.87 mg/dL (ref 0.57–1.00)
Globulin, Total: 2.5 g/dL (ref 1.5–4.5)
Glucose: 149 mg/dL — ABNORMAL HIGH (ref 70–99)
Potassium: 5.1 mmol/L (ref 3.5–5.2)
Sodium: 140 mmol/L (ref 134–144)
Total Protein: 7 g/dL (ref 6.0–8.5)
eGFR: 69 mL/min/{1.73_m2} (ref >59)

## 2023-06-17 LAB — CBC WITH DIFFERENTIAL/PLATELET
Basophils Absolute: 0 10*3/uL (ref 0.0–0.2)
Basos: 1 %
EOS (ABSOLUTE): 0.3 10*3/uL (ref 0.0–0.4)
Eos: 4 %
Hematocrit: 40.5 % (ref 34.0–46.6)
Hemoglobin: 12.8 g/dL (ref 11.1–15.9)
Immature Grans (Abs): 0 10*3/uL (ref 0.0–0.1)
Immature Granulocytes: 0 %
Lymphocytes Absolute: 2.7 10*3/uL (ref 0.7–3.1)
Lymphs: 32 %
MCH: 26.6 pg (ref 26.6–33.0)
MCHC: 31.6 g/dL (ref 31.5–35.7)
MCV: 84 fL (ref 79–97)
Monocytes Absolute: 1 10*3/uL — ABNORMAL HIGH (ref 0.1–0.9)
Monocytes: 12 %
Neutrophils Absolute: 4.5 10*3/uL (ref 1.4–7.0)
Neutrophils: 51 %
Platelets: 278 10*3/uL (ref 150–450)
RBC: 4.82 x10E6/uL (ref 3.77–5.28)
RDW: 14.6 % (ref 11.7–15.4)
WBC: 8.6 10*3/uL (ref 3.4–10.8)

## 2023-06-17 LAB — LIPID PANEL
Chol/HDL Ratio: 4.6 ratio — ABNORMAL HIGH (ref 0.0–4.4)
Cholesterol, Total: 218 mg/dL — ABNORMAL HIGH (ref 100–199)
HDL: 47 mg/dL (ref >39)
LDL Chol Calc (NIH): 138 mg/dL — ABNORMAL HIGH (ref 0–99)
Triglycerides: 185 mg/dL — ABNORMAL HIGH (ref 0–149)
VLDL Cholesterol Cal: 33 mg/dL (ref 5–40)

## 2023-06-17 LAB — TSH: TSH: 2.95 u[IU]/mL (ref 0.450–4.500)

## 2023-06-17 LAB — METHYLMALONIC ACID, SERUM: Methylmalonic Acid: 156 nmol/L (ref 0–378)

## 2023-06-17 LAB — B12 AND FOLATE PANEL
Folate: 16 ng/mL (ref >3.0)
Vitamin B-12: 471 pg/mL (ref 232–1245)

## 2023-06-17 LAB — HEMOGLOBIN A1C
Est. average glucose Bld gHb Est-mCnc: 197 mg/dL
Hgb A1c MFr Bld: 8.5 % — ABNORMAL HIGH (ref 4.8–5.6)

## 2023-06-17 MED ORDER — SEMAGLUTIDE (1 MG/DOSE) 4 MG/3ML ~~LOC~~ SOPN: 2.0000 mg | PEN_INJECTOR | SUBCUTANEOUS | 2 refills | Status: DC

## 2023-06-18 ENCOUNTER — Other Ambulatory Visit: Payer: Medicare Other | Admitting: Pharmacist

## 2023-06-18 ENCOUNTER — Telehealth: Payer: Self-pay | Admitting: Pharmacist

## 2023-06-18 NOTE — Progress Notes (Signed)
06/18/2023 Name: Caitlin [name] MRN: [id number] DOB: 06-Oct-1945   Outreached patient for scheduled phone call for medication management. PCP requests medication list review + adherence packaging. Patient initially engaged, but does not desire to leave her current pharmacy (which does not offer adherence packaging). She does want to review medication list and streamline medications/reduce pill burden.   Today for scheduled call, patient answered and reported she was feeling under the weather. She wishes to postpone the call, and desired an outreach ~1 week to get rescheduled.    Follow-up: 1 week to reschedule for pharmacist medication review + reduce pill burden, ongoing discussion about adherence packaging. Will send to careguide for reschedule   Lynnda Shields, PharmD, BCPS Clinical Pharmacist [facility]

## 2023-06-27 ENCOUNTER — Telehealth: Payer: Self-pay

## 2023-06-27 NOTE — Progress Notes (Signed)
  Care Coordination Note  06/27/2023 Name: [name] MRN: [id number] DOB: 1945/10/22  Caitlin [name] is a 77 y.o. year old female who is a primary care patient of Cox, Kirsten, MD and is actively engaged with the Care Management team. I reached out to [facility] by phone today to assist with re-scheduling a follow up visit with the Pharmacist  Follow up plan: Unsuccessful telephone outreach attempt made. A HIPAA compliant phone message was left for the patient providing contact information and requesting a return call.   Penne Lash, RMA Care Guide [facility]  [place], [place] [address] Direct Dial: [phone] [email or web address]

## 2023-06-29 ENCOUNTER — Ambulatory Visit
Admission: RE | Admit: 2023-06-29 | Discharge: 2023-06-29 | Disposition: A | Discharge status: Home / Self Care | Payer: Medicare Other | Source: Ambulatory Visit | Attending: Family Medicine | Admitting: Family Medicine

## 2023-06-29 DIAGNOSIS — M542 Cervicalgia: Secondary | ICD-10-CM

## 2023-06-29 DIAGNOSIS — M5412 Radiculopathy, cervical region: Secondary | ICD-10-CM

## 2023-06-30 ENCOUNTER — Encounter: Payer: Self-pay | Admitting: Gastroenterology

## 2023-07-10 NOTE — Progress Notes (Signed)
  Care Coordination Note  07/10/2023 Name: [name] MRN: [id number] DOB: 01-Dec-1945  Caitlin [name] is a 77 y.o. year old female who is a primary care patient of Cox, Kirsten, MD and is actively engaged with the Chronic Care Management team. I reached out to [facility] by phone today to assist with re-scheduling an initial visit with the Pharmacist  Follow up plan: Unsuccessful telephone outreach attempt made. A HIPAA compliant phone message was left for the patient providing contact information and requesting a return call.   Penne Lash , RMA     [facility]  [facility] Guide  Direct Dial: [phone]  Website: [email or web address]

## 2023-07-14 ENCOUNTER — Ambulatory Visit: Payer: Medicare Other | Admitting: Podiatry

## 2023-07-15 NOTE — Progress Notes (Signed)
  Care Coordination Note  07/15/2023 Name: Darrell Jelley MRN: [id number] DOB: 1945-12-01  Caitlin [name] is a 77 y.o. year old female who is a primary care patient of Cox, Kirsten, MD and is actively engaged with the Chronic Care Management team. I reached out to [facility] by phone today to assist with re-scheduling a follow up visit with the Pharmacist  Follow up plan: Unable to make contact on outreach attempts x 3. PCP Cox, Kirsten, MD notified via routed documentation in medical record.   Penne Lash , RMA     [facility]  [facility] Guide  Direct Dial: [phone]  Website: [email or web address]

## 2023-07-21 ENCOUNTER — Ambulatory Visit: Payer: Medicare Other | Admitting: Podiatry

## 2023-07-28 ENCOUNTER — Other Ambulatory Visit: Payer: Self-pay

## 2023-07-28 ENCOUNTER — Ambulatory Visit (INDEPENDENT_AMBULATORY_CARE_PROVIDER_SITE_OTHER): Payer: Medicare Other | Admitting: Podiatry

## 2023-07-28 ENCOUNTER — Encounter: Payer: Self-pay | Admitting: Podiatry

## 2023-07-28 DIAGNOSIS — I739 Peripheral vascular disease, unspecified: Secondary | ICD-10-CM

## 2023-07-28 DIAGNOSIS — M542 Cervicalgia: Secondary | ICD-10-CM

## 2023-07-28 DIAGNOSIS — L84 Corns and callosities: Secondary | ICD-10-CM

## 2023-07-28 DIAGNOSIS — E1140 Type 2 diabetes mellitus with diabetic neuropathy, unspecified: Secondary | ICD-10-CM | POA: Diagnosis not present

## 2023-07-28 DIAGNOSIS — M21611 Bunion of right foot: Secondary | ICD-10-CM

## 2023-07-28 DIAGNOSIS — Z794 Long term (current) use of insulin: Secondary | ICD-10-CM | POA: Diagnosis not present

## 2023-07-30 NOTE — Progress Notes (Signed)
Subjective:  Patient ID: Caitlin [name], female    DOB: [birth year],  MRN: [id number]   Caylani Weatherwax presents to clinic today for:  Chief Complaint  Patient presents with   Foot Pain    DM II. Rt Foot gets really red/ hot at times. Has callus on bottom on RT big toe. Possible bunions on each side of foot.  Patient is a 77 year old female who comes in due to painful callus underneath the right first metatarsal head region.  She was referred by her primary care physician for this.  She is diabetic, last A1c 8.5.  He does endorse numbness and tingling in her toes.  She states that occasionally the right foot will become red hot at times with some swelling.   PCP is Cox, Fritzi Mandes, MD.  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 Small internal hemorrhoids. Otherwise normal colonosopy.    LUMBAR WOUND  DEBRIDEMENT N/A 08/09/2019   Procedure: LUMBAR WOUND DEBRIDEMENT Evacuation of  EPIDURAL HEMATOMA;  Surgeon: Tressie Stalker, MD;  Location: [facility] OR;  Service: Neurosurgery;  Laterality: N/A;    Allergies  Allergen Reactions   Valsartan Swelling   Codeine Nausea Only    Review of Systems: Negative except as noted in the HPI.  Objective:  There were no vitals filed for this visit.  Caitlin [name] is a pleasant 77 y.o. female in NAD. AAO x 3.  Vascular Examination: Capillary refill time approximately 3 seconds to toes bilateral.  Palpable DP pulses, nonpalpable PT pulses b/l LE. Digital hair absent b/l.  Skin temperature gradient WNL b/l.  Varicosities present b/l. No cyanosis noted b/l.   Dermatological Examination: Fissure present right first metatarsal head plantar aspect with significant hyperkeratotic tissue.  Painful on palpation.  Appears limited to partial-thickness breakdown of skin.  Preulcerative in nature.  No significant erythema or edema to the right foot or left foot.  Nails x 10 are within normal limits for length, increased thickness and discoloration is noted.  Neurological Examination: Vibratory sensation diminished bilaterally.  Protective sensation diminished bilaterally.  Musculoskeletal Examination: Muscle strength 5/5 to all LE muscle groups b/l.  Significant bunion deformities present right greater than left.     Latest Ref Rng & Units 06/11/2023    9:11 AM 02/24/2023    8:21  AM 11/06/2022    2:00 PM  Hemoglobin A1C  Hemoglobin-A1c 4.8 - 5.6 % 8.5  8.5  9.9      Assessment/Plan: 1. Type 2 diabetes mellitus with diabetic neuropathy, with long-term current use of insulin (HCC)   2. Bunion of great toe of right foot   3. Pre-ulcerative calluses   4. PAD (peripheral artery disease) (HCC)     No orders of the defined types were placed in this encounter.  FOR HOME USE ONLY DME DIABETIC SHOE  Plan: - Preulcerative fissure was sharply debrided using 312  scalpel blade.  Area of healthy bleeding tissue noted, hemostasis achieved with silver nitrate.  Antibiotic ointment and bandage applied. -Dancers pad dispensed to the patient to offload the right foot lesion.  Patient may continue to apply antibiotic ointment and bandage for the next 5 to 7 days as needed. -Discussed strategies for home management and offloading such as skin moisturization, white vinegar scrubs, felt or foam offloading pads as dispensed, shoe gear modifications, custom inserts or shoes. -Prescribing diabetic shoes and inserts for the patient.  She would benefit from this due to her bunion deformities, preulcerative callus, neuropathy, diminished pulses -Discussed importance of tight glucose control, daily foot checks, avoiding ambulating barefoot. -Follow-up in 3 months for diabetic footcare.  Return in about 3 months (around 10/26/2023) for Diabetic Foot Care.   Bronwen Betters, DPM, AACFAS [facility]     [address] [place], [place] [address]                Office [phone]  Fax [phone]

## 2023-08-07 ENCOUNTER — Other Ambulatory Visit: Payer: Self-pay | Admitting: Family Medicine

## 2023-08-07 DIAGNOSIS — E782 Mixed hyperlipidemia: Secondary | ICD-10-CM

## 2023-08-20 HISTORY — PX: LOOP RECORDER IMPLANT: SHX5954

## 2023-09-16 ENCOUNTER — Other Ambulatory Visit: Payer: Medicare Other

## 2023-09-17 ENCOUNTER — Other Ambulatory Visit: Payer: Self-pay | Admitting: Family Medicine

## 2023-09-17 DIAGNOSIS — E1140 Type 2 diabetes mellitus with diabetic neuropathy, unspecified: Secondary | ICD-10-CM

## 2023-09-23 NOTE — Progress Notes (Signed)
 Subjective:  Patient ID: Caitlin [name], female    DOB: 20-Oct-1945  Age: 78 y.o. MRN: [id number]  Chief Complaint  Patient presents with   Medical Management of Chronic Issues    HPI History of Present Illness The patient, with a history of diabetes and high cholesterol, presents with worsening constipation over the last six months. The patient reports straining and discomfort during bowel movements, with occasional bleeding. Despite taking stool softeners and Dulcolax, the patient reports minimal relief. The patient also reports left-sided arm numbness, which is not constant but presents as a prickly feeling. The patient has a history of restless legs but describes this sensation as different, more of an achy feeling. The patient also reports some asthma symptoms, which have been slightly worse, possibly due to cold weather. The patient is on Breztri  regularly. The patient also reports a sore feeling in the stomach area, which she associates with constipation. The patient's diabetes is not well controlled, with sugars ranging from 150 to 225. The patient is on Ozempic , which she suspects may be contributing to the constipation. The patient has stopped taking rosuvastatin  due to muscle pain and is considering other options for cholesterol management. Patient in office today for follow up for diabetes, hypertension, and hyperlipidemia.   Diabetes:  Complications: neuropathy Glucose checking: CGM Glucose logs:150-225 Hypoglycemia: no Most recent A1C: 8.5% Current medications: tresiba  56 U daily. ON ozempic  2 mg weekly ON lyrica  100 mg one at noon and 2 at night.   Foot checks: daily   Hyperlipidemia: Current medications: stopped crestor  40 mg daily due to muscle aches. . Vascepa  1 gm 2 capsules twice daily.    Hypertension: Current medications: Carvedilol  6.25 mg twice a day, Lasix  80 mg daily, diltiazem  300 mg every day.   Atrial fibrillation: on carvedilol  and xarelto.    COPD/Asthma:  On Breztri  2 puffs twice daily. Uses albuterol  sporadically.  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 Doing well.      09/24/2023    8:54 AM 06/11/2023    8:09 AM 02/24/2023    7:35 AM 10/22/2022   11:03 AM 04/04/2022   11:30 AM  Depression screen PHQ 2/9  Decreased Interest 1 1 0 0 0  Down, Depressed, Hopeless 1 2 0 0 0  PHQ - 2 Score 2 3 0 0 0  Altered sleeping 1 2 0 1   Tired, decreased energy 1 2 0 1   Change in appetite 1 2 0 0   Feeling bad or failure about yourself  0 0 0 0   Trouble concentrating 0 1 0 0   Moving slowly or fidgety/restless 0 1 0 0   Suicidal thoughts 0 0 0 0   PHQ-9 Score 5 11 0 2   Difficult doing work/chores Somewhat difficult Somewhat difficult Not difficult at all Not difficult at all         09/24/2023    8:54 AM  Fall Risk   Falls in the past year? 1  Number falls in past yr: 0  Injury with Fall? 0  Risk for fall due to : No Fall Risks    Patient Care Team: Sherre Clapper, MD as PCP - General (Family Medicine) Braulio Hough, MD as Referring Physician (Internal Medicine) Onita Duos, MD as Consulting Physician (Neurology) Monetta Redell PARAS, MD as Consulting Physician (Cardiology) Steen Evalene CROME., MD (Urology) Joshua Alm Hamilton, MD as Consulting Physician (Neurosurgery) Myrick Elspeth PARAS., DPM (Podiatry)   Review of Systems  Constitutional:  Negative for  chills, fatigue and fever.  HENT:  Negative for congestion, ear pain and sore throat.   Respiratory:  Negative for cough and shortness of breath.   Cardiovascular:  Negative for chest pain.  Gastrointestinal:  Positive for constipation (going on 3 months). Negative for abdominal pain, diarrhea, nausea and vomiting.  Genitourinary:  Negative for dysuria and frequency.  Musculoskeletal:  Negative for arthralgias and myalgias.  Neurological:  Negative for dizziness and headaches.  Psychiatric/Behavioral:  Negative for dysphoric mood. The patient is not nervous/anxious.     Current Outpatient  Medications on File Prior to Visit  Medication Sig Dispense Refill   albuterol  (VENTOLIN  HFA) 108 (90 Base) MCG/ACT inhaler Inhale 2 puffs into the lungs every 6 (six) hours as needed for wheezing or shortness of breath. 8 g 3   BD PEN NEEDLE NANO 2ND GEN 32G X 4 MM MISC 1 EACH BY DOES NOT APPLY ROUTE IN THE MORNING AND AT BEDTIME. 100 each 2   buPROPion  (WELLBUTRIN  XL) 300 MG 24 hr tablet TAKE 1 TABLET BY MOUTH EVERY DAY IN THE MORNING 90 tablet 1   calcium  carbonate (TUMS - DOSED IN MG ELEMENTAL CALCIUM ) 500 MG chewable tablet Chew 2 tablets by mouth daily as needed for indigestion.      carvedilol  (COREG ) 6.25 MG tablet TAKE 1 TABLET BY MOUTH TWICE A DAY 180 tablet 1   Continuous Blood Gluc Receiver (FREESTYLE LIBRE 14 DAY READER) DEVI USE TO CHECK BLOOD SUGARS DAILY     Continuous Blood Gluc Sensor (FREESTYLE LIBRE 14 DAY SENSOR) MISC SMARTSIG:1 Topical Every 2 Weeks     diltiazem  (CARDIZEM  CD) 300 MG 24 hr capsule TAKE 1 CAPSULE BY MOUTH EVERY DAY 90 capsule 0   fexofenadine (ALLEGRA) 180 MG tablet Take 180 mg by mouth daily as needed for allergies or rhinitis.     furosemide  (LASIX ) 80 MG tablet TAKE 1 TABLET BY MOUTH 2 TIMES DAILY. 180 tablet 0   hydrOXYzine  (VISTARIL ) 25 MG capsule TAKE 1 CAPSULE (25 MG TOTAL) BY MOUTH EVERY 8 (EIGHT) HOURS AS NEEDED FOR ANXIETY. 270 capsule 1   insulin  degludec (TRESIBA  FLEXTOUCH) 200 UNIT/ML FlexTouch Pen Inject 56 Units into the skin every evening. 27 mL 0   Insulin  Pen Needle 31G X 8 MM MISC Inject 1 Units into the skin daily.     mirabegron  ER (MYRBETRIQ ) 25 MG TB24 tablet Take 1 tablet (25 mg total) by mouth daily. 30 tablet 1   omega-3 acid ethyl esters (LOVAZA ) 1 g capsule TAKE 2 CAPSULES BY MOUTH 2 TIMES DAILY. 360 capsule 0   omeprazole  (PRILOSEC) 40 MG capsule Take 1 capsule (40 mg total) by mouth in the morning and at bedtime. 180 capsule 1   ondansetron  (ZOFRAN ) 4 MG tablet Take 1 tablet (4 mg total) by mouth every 8 (eight) hours as needed for  nausea or vomiting. 20 tablet 0   rivaroxaban (XARELTO) 20 MG TABS tablet Take by mouth at bedtime.     traMADol  (ULTRAM ) 50 MG tablet Take 1 tablet (50 mg total) by mouth 2 (two) times daily as needed for severe pain (pain score 7-10). 60 tablet 2   VITAMIN D-VITAMIN K PO Take 1 tablet by mouth every evening.     No current facility-administered medications on file prior to visit.   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) 12/13/2019   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 Small internal hemorrhoids. Otherwise normal colonosopy.    LUMBAR WOUND DEBRIDEMENT N/A 08/09/2019   Procedure: LUMBAR WOUND DEBRIDEMENT Evacuation of  EPIDURAL HEMATOMA;  Surgeon: Mavis Purchase, MD;  Location: [facility] OR;  Service: Neurosurgery;  Laterality: N/A;    Family History  Problem Relation Age of Onset   Breast cancer Mother 11   Heart attack  Father 100   Mental illness Sister    Heart Problems Brother 38   Cancer Brother    Breast cancer Paternal Grandmother    Colon cancer Paternal Aunt 38   Social History   Socioeconomic History   Marital status: Married    Spouse name: Alm   Number of children: Not on file   Years of education: Not on file   Highest education level: Not on file  Occupational History   Occupation: Retired Pensions Consultant  Tobacco Use   Smoking status: Never    Passive exposure: Past   Smokeless tobacco: Never  Vaping Use   Vaping status: Never Used  Substance and Sexual Activity   Alcohol use: Not Currently   Drug use: Never   Sexual activity: Not Currently  Other Topics Concern   Not on file  Social History Narrative   Not on file   Social Drivers of Health   Financial Resource Strain: Low Risk  (04/04/2022)   Overall Financial Resource Strain (CARDIA)    Difficulty of Paying Living Expenses: Not hard at all  Food Insecurity: Low Risk  (10/11/2022)   [facility]   Hunger Vital Sign    Worried About Running Out of Food in the Last Year: Never true    Within the past 12 months, the food you bought just didn't last and you didn't have money to get more: Not on file  Transportation Needs: No Transportation Needs (10/11/2022)   [facility]   Transportation    In the past 12 months, has lack of reliable transportation kept you from medical appointments, meetings, work or from getting things needed for daily living? : No  Physical Activity: Inactive (04/04/2022)   Exercise Vital Sign    Days of Exercise per Week: 0 days    Minutes of Exercise per Session: 0 min  Stress: No Stress Concern Present (04/04/2022)   [employer] of Occupational Health - Occupational Stress Questionnaire    Feeling of Stress : Not at all  Social Connections: Moderately  Integrated (11/06/2022)   Social Connection and Isolation Panel [NHANES]     Frequency of Communication with Friends and Family: Twice a week    Frequency of Social Gatherings with Friends and Family: Twice a week    Attends Religious Services: More than 4 times per year    Active Member of [employer] or Organizations: No    Attends Engineer, Structural: Never    Marital Status: Married    Objective:  BP 138/86   Pulse 77   Temp (!) 97.4 F (36.3 C)   Ht 5' 3 (1.6 m)   Wt 201 lb (91.2 kg)   SpO2 97%   BMI 35.61 kg/m      09/24/2023    8:50 AM 06/11/2023    8:05 AM 04/15/2023    4:41 PM  BP/Weight  Systolic BP 138 136 110  Diastolic BP 86 86 80  Wt. (Lbs) 201 209   BMI 35.61 kg/m2 37.02 kg/m2     Physical Exam Vitals reviewed.  Constitutional:      Appearance: Normal appearance. She is obese.  Neck:     Vascular: No carotid bruit.  Cardiovascular:     Rate and Rhythm: Normal rate and regular rhythm.     Heart sounds: Normal heart sounds.  Pulmonary:     Effort: Pulmonary effort is normal. No respiratory distress.     Breath sounds: Normal breath sounds.  Abdominal:     General: Abdomen is flat. Bowel sounds are normal.     Palpations: Abdomen is soft.     Tenderness: There is no abdominal tenderness.  Neurological:     Mental Status: She is alert and oriented to person, place, and time.  Psychiatric:        Mood and Affect: Mood normal.        Behavior: Behavior normal.     Diabetic Foot Exam - Simple   Simple Foot Form  09/24/2023  7:56 PM  Visual Inspection No deformities, no ulcerations, no other skin breakdown bilaterally: Yes Sensation Testing Intact to touch and monofilament testing bilaterally: Yes Pulse Check Posterior Tibialis and Dorsalis pulse intact bilaterally: Yes Comments      Lab Results  Component Value Date   WBC 10.6 09/24/2023   HGB 13.6 09/24/2023   HCT 42.5 09/24/2023   PLT 303 09/24/2023   GLUCOSE 72 09/24/2023   CHOL 204 (H) 09/24/2023   TRIG 133 09/24/2023   HDL 50 09/24/2023   LDLCALC 130  (H) 09/24/2023   ALT 12 09/24/2023   AST 11 09/24/2023   NA 143 09/24/2023   K 5.6 (H) 09/24/2023   CL 103 09/24/2023   CREATININE 0.91 09/24/2023   BUN 16 09/24/2023   CO2 25 09/24/2023   TSH 2.950 06/11/2023   INR 0.9 08/04/2019   HGBA1C 7.3 (H) 09/24/2023      Assessment & Plan:    Gastroesophageal reflux disease without esophagitis Assessment & Plan: The current medical regimen is effective;  continue present plan and medications. Continue omeprazole  twice daily.    Diabetic peripheral neuropathy associated with type 2 diabetes mellitus (HCC) Assessment & Plan: Not at goal. Continue tresiba  56 U daily.  Stop Ozempic .  Start Mounjaro  2.5 mg weekly x 4 weeks and then increase to 5 mg weekly.  Samples given for your first month. Continue other diabetes medicines. Continue to use cgm.  Continue to check feet daily.   Orders: -     Hemoglobin A1c -  Microalbumin / creatinine urine ratio -     Gabapentin ; Take 1 tablet (600 mg total) by mouth 3 (three) times daily.  Dispense: 90 tablet; Refill: 3 -     Tirzepatide ; Inject 5 mg into the skin once a week.  Dispense: 2 mL; Refill: 0  Mixed hyperlipidemia Assessment & Plan: Await labs/testing for assessment and recommendations. Intolerant to crestor .  Recommend start atorvastatin  10 mg nightly.  Continue Vascepa  2 capsules twice daily.  Orders: -     CBC with Differential/Platelet -     Comprehensive metabolic panel -     Lipid panel -     Atorvastatin  Calcium ; Take 1 tablet (10 mg total) by mouth daily.  Dispense: 90 tablet; Refill: 0  Restless leg syndrome Assessment & Plan: Continue lyrica .    Simple chronic bronchitis (HCC) Assessment & Plan: On Breztri  2 puffs twice daily. Uses albuterol  sporadically.  Orders: -     Albuterol  Sulfate; Take 3 mLs (2.5 mg total) by nebulization every 6 (six) hours as needed for wheezing or shortness of breath.  Dispense: 75 mL; Refill: 0 -     Breztri  Aerosphere; Inhale 2  puffs into the lungs in the morning and at bedtime.  Dispense: 10.7 g; Refill: 3  Cervical radiculopathy Assessment & Plan: Increase gabapentin  to 600 mg 3 times a day.  Increase gradually as we discussed.   Drug-induced constipation Assessment & Plan: Hopefully by changing off of Ozempic  this will improve.  For now continue Dulcolax and a stool softener.   Class 2 severe obesity due to excess calories with serious comorbidity and body mass index (BMI) of 35.0 to 35.9 in adult [facility]) Assessment & Plan: Recommend continue to work on eating healthy diet and exercise. Comorbidities: hypertension   Mild recurrent major depression (HCC) Assessment & Plan: The current medical regimen is effective;  continue present plan and medications. Continue wellbutrin .    General Health Maintenance -Continue annual wellness visits. -No need for colonoscopy until 2030 based on previous colonoscopy in 2020 and current guidelines.  Meds ordered this encounter  Medications   albuterol  (PROVENTIL ) (2.5 MG/3ML) 0.083% nebulizer solution    Sig: Take 3 mLs (2.5 mg total) by nebulization every 6 (six) hours as needed for wheezing or shortness of breath.    Dispense:  75 mL    Refill:  0   Budeson-Glycopyrrol-Formoterol  (BREZTRI  AEROSPHERE) 160-9-4.8 MCG/ACT AERO    Sig: Inhale 2 puffs into the lungs in the morning and at bedtime.    Dispense:  10.7 g    Refill:  3   gabapentin  (NEURONTIN ) 600 MG tablet    Sig: Take 1 tablet (600 mg total) by mouth 3 (three) times daily.    Dispense:  90 tablet    Refill:  3   tirzepatide  (MOUNJARO ) 5 MG/0.5ML Pen    Sig: Inject 5 mg into the skin once a week.    Dispense:  2 mL    Refill:  0   atorvastatin  (LIPITOR) 10 MG tablet    Sig: Take 1 tablet (10 mg total) by mouth daily.    Dispense:  90 tablet    Refill:  0    Orders Placed This Encounter  Procedures   CBC with Differential/Platelet   Comprehensive metabolic panel   Hemoglobin A1c   Lipid  panel   Microalbumin / creatinine urine ratio     Follow-up: Return in about 6 weeks (around 11/05/2023) for chronic follow up also needs a 3 month fasting appointment. SABRA  Total time spent on today's visit was 50  minutes, including both face-to-face time and nonface-to-face time personally spent on review of chart (labs and imaging), discussing labs and goals, discussing further work-up, treatment options, referrals to specialist if needed, reviewing outside records of pertinent, answering patient's questions, and coordinating care.  I,Marla I Leal-Borjas,acting as a scribe for Abigail Free, MD.,have documented all relevant documentation on the behalf of Abigail Free, MD,as directed by  Abigail Free, MD while in the presence of Abigail Free, MD.   An After Visit Summary was printed and given to the patient.  I attest that I have reviewed this visit and agree with the plan scribed by my staff.   Abigail Free, MD [facility] [phone]

## 2023-09-24 ENCOUNTER — Ambulatory Visit: Payer: Medicare Other | Admitting: Family Medicine

## 2023-09-24 ENCOUNTER — Encounter: Payer: Self-pay | Admitting: Family Medicine

## 2023-09-24 VITALS — BP 138/86 | HR 77 | Temp 97.4°F | Ht 63.0 in | Wt 201.0 lb

## 2023-09-24 DIAGNOSIS — F330 Major depressive disorder, recurrent, mild: Secondary | ICD-10-CM

## 2023-09-24 DIAGNOSIS — L84 Corns and callosities: Secondary | ICD-10-CM | POA: Insufficient documentation

## 2023-09-24 DIAGNOSIS — E782 Mixed hyperlipidemia: Secondary | ICD-10-CM | POA: Diagnosis not present

## 2023-09-24 DIAGNOSIS — M533 Sacrococcygeal disorders, not elsewhere classified: Secondary | ICD-10-CM | POA: Insufficient documentation

## 2023-09-24 DIAGNOSIS — G2581 Restless legs syndrome: Secondary | ICD-10-CM | POA: Diagnosis not present

## 2023-09-24 DIAGNOSIS — R413 Other amnesia: Secondary | ICD-10-CM

## 2023-09-24 DIAGNOSIS — E66812 Obesity, class 2: Secondary | ICD-10-CM

## 2023-09-24 DIAGNOSIS — E1159 Type 2 diabetes mellitus with other circulatory complications: Secondary | ICD-10-CM

## 2023-09-24 DIAGNOSIS — E1142 Type 2 diabetes mellitus with diabetic polyneuropathy: Secondary | ICD-10-CM

## 2023-09-24 DIAGNOSIS — J4540 Moderate persistent asthma, uncomplicated: Secondary | ICD-10-CM

## 2023-09-24 DIAGNOSIS — J410 Simple chronic bronchitis: Secondary | ICD-10-CM

## 2023-09-24 DIAGNOSIS — K5903 Drug induced constipation: Secondary | ICD-10-CM

## 2023-09-24 DIAGNOSIS — Z6835 Body mass index (BMI) 35.0-35.9, adult: Secondary | ICD-10-CM

## 2023-09-24 DIAGNOSIS — K219 Gastro-esophageal reflux disease without esophagitis: Secondary | ICD-10-CM

## 2023-09-24 DIAGNOSIS — M5412 Radiculopathy, cervical region: Secondary | ICD-10-CM

## 2023-09-24 DIAGNOSIS — J441 Chronic obstructive pulmonary disease with (acute) exacerbation: Secondary | ICD-10-CM

## 2023-09-24 LAB — COMPREHENSIVE METABOLIC PANEL
ALT: 12 [IU]/L (ref 0–32)
AST: 11 [IU]/L (ref 0–40)
Albumin: 4.5 g/dL (ref 3.8–4.8)
Alkaline Phosphatase: 91 [IU]/L (ref 44–121)
BUN/Creatinine Ratio: 18 (ref 12–28)
BUN: 16 mg/dL (ref 8–27)
Bilirubin Total: 0.3 mg/dL (ref 0.0–1.2)
CO2: 25 mmol/L (ref 20–29)
Calcium: 9.9 mg/dL (ref 8.7–10.3)
Chloride: 103 mmol/L (ref 96–106)
Creatinine, Ser: 0.91 mg/dL (ref 0.57–1.00)
Globulin, Total: 2.3 g/dL (ref 1.5–4.5)
Glucose: 72 mg/dL (ref 70–99)
Potassium: 5.6 mmol/L — ABNORMAL HIGH (ref 3.5–5.2)
Sodium: 143 mmol/L (ref 134–144)
Total Protein: 6.8 g/dL (ref 6.0–8.5)
eGFR: 65 mL/min/{1.73_m2} (ref >59)

## 2023-09-24 LAB — CBC WITH DIFFERENTIAL/PLATELET
Basophils Absolute: 0.1 10*3/uL (ref 0.0–0.2)
Basos: 1 %
EOS (ABSOLUTE): 0.2 10*3/uL (ref 0.0–0.4)
Eos: 2 %
Hematocrit: 42.5 % (ref 34.0–46.6)
Hemoglobin: 13.6 g/dL (ref 11.1–15.9)
Immature Grans (Abs): 0 10*3/uL (ref 0.0–0.1)
Immature Granulocytes: 0 %
Lymphocytes Absolute: 2.5 10*3/uL (ref 0.7–3.1)
Lymphs: 24 %
MCH: 27 pg (ref 26.6–33.0)
MCHC: 32 g/dL (ref 31.5–35.7)
MCV: 84 fL (ref 79–97)
Monocytes Absolute: 1.1 10*3/uL — ABNORMAL HIGH (ref 0.1–0.9)
Monocytes: 10 %
Neutrophils Absolute: 6.7 10*3/uL (ref 1.4–7.0)
Neutrophils: 63 %
Platelets: 303 10*3/uL (ref 150–450)
RBC: 5.04 x10E6/uL (ref 3.77–5.28)
RDW: 16.7 % — ABNORMAL HIGH (ref 11.7–15.4)
WBC: 10.6 10*3/uL (ref 3.4–10.8)

## 2023-09-24 LAB — LIPID PANEL
Chol/HDL Ratio: 4.1 {ratio} (ref 0.0–4.4)
Cholesterol, Total: 204 mg/dL — ABNORMAL HIGH (ref 100–199)
HDL: 50 mg/dL (ref >39)
LDL Chol Calc (NIH): 130 mg/dL — ABNORMAL HIGH (ref 0–99)
Triglycerides: 133 mg/dL (ref 0–149)
VLDL Cholesterol Cal: 24 mg/dL (ref 5–40)

## 2023-09-24 LAB — HEMOGLOBIN A1C
Est. average glucose Bld gHb Est-mCnc: 163 mg/dL
Hgb A1c MFr Bld: 7.3 % — ABNORMAL HIGH (ref 4.8–5.6)

## 2023-09-24 MED ORDER — BREZTRI AEROSPHERE 160-9-4.8 MCG/ACT IN AERO: 2.0000 | INHALATION_SPRAY | Freq: Two times a day (BID) | RESPIRATORY_TRACT | 3 refills | Status: AC

## 2023-09-24 MED ORDER — ATORVASTATIN CALCIUM 10 MG PO TABS: 10.0000 mg | ORAL_TABLET | Freq: Every day | ORAL | 0 refills | Status: DC

## 2023-09-24 MED ORDER — ALBUTEROL SULFATE (2.5 MG/3ML) 0.083% IN NEBU: 2.5000 mg | INHALATION_SOLUTION | Freq: Four times a day (QID) | RESPIRATORY_TRACT | 0 refills | Status: AC | PRN

## 2023-09-24 MED ORDER — TIRZEPATIDE 5 MG/0.5ML ~~LOC~~ SOAJ: 5.0000 mg | SUBCUTANEOUS | 0 refills | Status: DC

## 2023-09-24 MED ORDER — GABAPENTIN 600 MG PO TABS: 600.0000 mg | ORAL_TABLET | Freq: Three times a day (TID) | ORAL | 3 refills | Status: DC

## 2023-09-24 NOTE — Assessment & Plan Note (Addendum)
 Not at goal. Continue tresiba  56 U daily.  Stop Ozempic .  Start Mounjaro  2.5 mg weekly x 4 weeks and then increase to 5 mg weekly.  Samples given for your first month. Continue other diabetes medicines. Continue to use cgm.  Continue to check feet daily.

## 2023-09-24 NOTE — Assessment & Plan Note (Signed)
 Increase gabapentin  to 600 mg 3 times a day.  Increase gradually as we discussed.

## 2023-09-24 NOTE — Patient Instructions (Signed)
 For your diabetes: Stop Ozempic . Start Mounjaro  2.5 mg weekly x 4 weeks and then increase to 5 mg weekly.  Samples given for your first month. Continue other diabetes medicines.  For hyperlipidemia/high cholesterol: Recommend start atorvastatin  10 mg nightly.  Continue Vascepa  2 capsules twice daily.  Constipation: Hopefully by changing off of Ozempic  this will improve.  For now continue Dulcolax and a stool softener.  Neck pain/left arm pain: Increase gabapentin  to 600 mg 3 times a day.  Increase gradually as we discussed.

## 2023-09-24 NOTE — Assessment & Plan Note (Addendum)
 Await labs/testing for assessment and recommendations. Intolerant to crestor .  Recommend start atorvastatin  10 mg nightly.  Continue Vascepa  2 capsules twice daily.

## 2023-09-27 ENCOUNTER — Encounter: Payer: Self-pay | Admitting: Family Medicine

## 2023-09-27 DIAGNOSIS — K5903 Drug induced constipation: Secondary | ICD-10-CM | POA: Insufficient documentation

## 2023-09-27 DIAGNOSIS — F331 Major depressive disorder, recurrent, moderate: Secondary | ICD-10-CM | POA: Insufficient documentation

## 2023-09-27 DIAGNOSIS — J449 Chronic obstructive pulmonary disease, unspecified: Secondary | ICD-10-CM | POA: Insufficient documentation

## 2023-09-27 NOTE — Assessment & Plan Note (Signed)
 Hopefully by changing off of Ozempic  this will improve.  For now continue Dulcolax and a stool softener.

## 2023-09-27 NOTE — Assessment & Plan Note (Addendum)
 Not controlled.  Strongly encouraged patient to take Breztri  2 puffs twice daily as this is her maintenance medicine.  Uses albuterol  sporadically.

## 2023-09-27 NOTE — Assessment & Plan Note (Signed)
 Continue lyrica

## 2023-09-27 NOTE — Assessment & Plan Note (Signed)
 Stable

## 2023-09-27 NOTE — Assessment & Plan Note (Addendum)
 The current medical regimen is effective;  continue present plan and medications. Continue omeprazole twice daily.

## 2023-09-27 NOTE — Assessment & Plan Note (Signed)
 The current medical regimen is effective;  continue present plan and medications. Continue wellbutrin .

## 2023-09-27 NOTE — Assessment & Plan Note (Signed)
 Recommend continue to work on eating healthy diet and exercise. Comorbidities: hypertension

## 2023-09-27 NOTE — Assessment & Plan Note (Addendum)
 On Breztri  2 puffs twice daily. Uses albuterol  sporadically.

## 2023-09-27 NOTE — Assessment & Plan Note (Addendum)
 Well controlled.  Continue to work on eating a healthy diet and exercise. The current medical regimen is effective;  continue present plan with Carvedilol  6.25 mg  twice daily  Labs drawn today.

## 2023-09-28 ENCOUNTER — Encounter: Payer: Self-pay | Admitting: Family Medicine

## 2023-09-29 ENCOUNTER — Other Ambulatory Visit: Payer: Medicare Other

## 2023-09-29 DIAGNOSIS — I152 Hypertension secondary to endocrine disorders: Secondary | ICD-10-CM

## 2023-09-30 ENCOUNTER — Encounter: Payer: Self-pay | Admitting: Family Medicine

## 2023-09-30 LAB — COMPREHENSIVE METABOLIC PANEL
ALT: 11 [IU]/L (ref 0–32)
AST: 16 [IU]/L (ref 0–40)
Albumin: 4.4 g/dL (ref 3.8–4.8)
Alkaline Phosphatase: 87 [IU]/L (ref 44–121)
BUN/Creatinine Ratio: 11 — ABNORMAL LOW (ref 12–28)
BUN: 11 mg/dL (ref 8–27)
Bilirubin Total: 0.2 mg/dL (ref 0.0–1.2)
CO2: 22 mmol/L (ref 20–29)
Calcium: 10.5 mg/dL — ABNORMAL HIGH (ref 8.7–10.3)
Chloride: 99 mmol/L (ref 96–106)
Creatinine, Ser: 1.02 mg/dL — ABNORMAL HIGH (ref 0.57–1.00)
Globulin, Total: 2.4 g/dL (ref 1.5–4.5)
Glucose: 137 mg/dL — ABNORMAL HIGH (ref 70–99)
Potassium: 4.8 mmol/L (ref 3.5–5.2)
Sodium: 140 mmol/L (ref 134–144)
Total Protein: 6.8 g/dL (ref 6.0–8.5)
eGFR: 57 mL/min/{1.73_m2} — ABNORMAL LOW (ref >59)

## 2023-10-02 ENCOUNTER — Ambulatory Visit: Payer: Medicare Other | Admitting: Nurse Practitioner

## 2023-10-02 NOTE — Progress Notes (Deleted)
 10/02/2023 Caitlin [name] [id number] [birth year]   CHIEF COMPLAINT: Diarrhea   HISTORY OF PRESENT ILLNESS: Caitlin [name] is a 78 year old female with a past medical history of arthritis, depression, hypertension, hyperlipidemia, atrial fibrillation, diabetes mellitus type 2, obstructive sleep apnea, COPD, cauda equina syndrome , GERD and colon polyps.      Latest Ref Rng & Units 09/24/2023   10:09 AM 06/11/2023    9:11 AM 04/15/2023    4:42 PM  CBC  WBC 3.4 - 10.8 x10E3/uL 10.6  8.6  9.1   Hemoglobin 11.1 - 15.9 g/dL 40.9  81.1  91.4   Hematocrit 34.0 - 46.6 % 42.5  40.5  44.3   Platelets 150 - 450 x10E3/uL 303  278  307        Latest Ref Rng & Units 09/29/2023    3:14 PM 09/24/2023   10:09 AM 06/11/2023    9:11 AM  CMP  Glucose 70 - 99 mg/dL 782  72  956   BUN 8 - 27 mg/dL 11  16  16    Creatinine 0.57 - 1.00 mg/dL 2.13  0.86  5.78   Sodium 134 - 144 mmol/L 140  143  140   Potassium 3.5 - 5.2 mmol/L 4.8  5.6  5.1   Chloride 96 - 106 mmol/L 99  103  103   CO2 20 - 29 mmol/L 22  25  22    Calcium 8.7 - 10.3 mg/dL 46.9  9.9  9.7   Total Protein 6.0 - 8.5 g/dL 6.8  6.8  7.0   Total Bilirubin 0.0 - 1.2 mg/dL 0.2  0.3  0.2   Alkaline Phos 44 - 121 IU/L 87  91  97   AST 0 - 40 IU/L 16  11  18    ALT 0 - 32 IU/L 11  12  18       Colonoscopy 10/15/2018: -Pancolonic diverticulosis predominantly in the sigmoid colon. -Moderate internal hemorrhoids (likely etiology of rectal bleeding, no bleeding currently).  -Otherwise normal colonoscopy to TI. 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) 12/13/2019   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) 12/13/2019   Morbid obesity (HCC) 12/13/2019   Obstructive sleep apnea    PONV (postoperative nausea and vomiting)    "not the last few times"   RLS (restless legs syndrome)    S/P ablation of atrial flutter 02/12/2016   S/P lumbar fusion 08/04/2019   Sleep apnea    Upper respiratory tract infection due to COVID-19 virus 08/14/2021   Past Surgical History:  Procedure Laterality Date   ATRIAL FIBRILLATION ABLATION     x3   CATARACT EXTRACTION Bilateral    COLONOSCOPY  01/04/2013   Moderate predominantly sigmoid diverticulosis. Small internal hemorrhoids. Otherwise normal colonosopy.    LUMBAR WOUND DEBRIDEMENT N/A 08/09/2019   Procedure: LUMBAR WOUND DEBRIDEMENT Evacuation of  EPIDURAL HEMATOMA;  Surgeon: Lovell Sheehan,  Tinnie Gens, MD;  Location: [facility] OR;  Service: Neurosurgery;  Laterality: N/A;   Social History:  Family History:    reports that she has never smoked. She has been exposed to tobacco smoke. She has never used smokeless tobacco. She reports that she does not currently use alcohol. She reports that she does not use drugs. family history includes Breast cancer in her paternal grandmother; Breast cancer (age of onset: 59) in her mother; Cancer in her brother; Colon cancer (age of onset: 36) in her paternal aunt; Heart Problems (age of onset: 22) in her brother; Heart attack (age of onset: 30) in her father; Mental illness in her sister. Allergies  Allergen Reactions   Valsartan Swelling   Codeine Nausea Only      Outpatient Encounter Medications as of 10/02/2023  Medication Sig   albuterol (PROVENTIL) (2.5 MG/3ML) 0.083% nebulizer solution Take 3 mLs (2.5 mg total) by nebulization every 6 (six) hours as needed for wheezing or shortness of breath.    albuterol (VENTOLIN HFA) 108 (90 Base) MCG/ACT inhaler Inhale 2 puffs into the lungs every 6 (six) hours as needed for wheezing or shortness of breath.   atorvastatin (LIPITOR) 10 MG tablet Take 1 tablet (10 mg total) by mouth daily.   BD PEN NEEDLE NANO 2ND GEN 32G X 4 MM MISC 1 EACH BY DOES NOT APPLY ROUTE IN THE MORNING AND AT BEDTIME.   Budeson-Glycopyrrol-Formoterol (BREZTRI AEROSPHERE) 160-9-4.8 MCG/ACT AERO Inhale 2 puffs into the lungs in the morning and at bedtime.   buPROPion (WELLBUTRIN XL) 300 MG 24 hr tablet TAKE 1 TABLET BY MOUTH EVERY DAY IN THE MORNING   calcium carbonate (TUMS - DOSED IN MG ELEMENTAL CALCIUM) 500 MG chewable tablet Chew 2 tablets by mouth daily as needed for indigestion.    carvedilol (COREG) 6.25 MG tablet TAKE 1 TABLET BY MOUTH TWICE A DAY   Continuous Blood Gluc Receiver (FREESTYLE LIBRE 14 DAY READER) DEVI USE TO CHECK BLOOD SUGARS DAILY   Continuous Blood Gluc Sensor (FREESTYLE LIBRE 14 DAY SENSOR) MISC SMARTSIG:1 Topical Every 2 Weeks   diltiazem (CARDIZEM CD) 300 MG 24 hr capsule TAKE 1 CAPSULE BY MOUTH EVERY DAY   fexofenadine (ALLEGRA) 180 MG tablet Take 180 mg by mouth daily as needed for allergies or rhinitis.   furosemide (LASIX) 80 MG tablet TAKE 1 TABLET BY MOUTH 2 TIMES DAILY.   gabapentin (NEURONTIN) 600 MG tablet Take 1 tablet (600 mg total) by mouth 3 (three) times daily.   hydrOXYzine (VISTARIL) 25 MG capsule TAKE 1 CAPSULE (25 MG TOTAL) BY MOUTH EVERY 8 (EIGHT) HOURS AS NEEDED FOR ANXIETY.   insulin degludec (TRESIBA FLEXTOUCH) 200 UNIT/ML FlexTouch Pen Inject 56 Units into the skin every evening.   Insulin Pen Needle 31G X 8 MM MISC Inject 1 Units into the skin daily.   mirabegron ER (MYRBETRIQ) 25 MG TB24 tablet Take 1 tablet (25 mg total) by mouth daily.   omega-3 acid ethyl esters (LOVAZA) 1 g capsule TAKE 2 CAPSULES BY MOUTH 2 TIMES DAILY.   omeprazole (PRILOSEC) 40 MG capsule Take 1 capsule (40 mg total) by mouth in the morning and at  bedtime.   ondansetron (ZOFRAN) 4 MG tablet Take 1 tablet (4 mg total) by mouth every 8 (eight) hours as needed for nausea or vomiting.   rivaroxaban (XARELTO) 20 MG TABS tablet Take by mouth at bedtime.   [START ON 10/22/2023] tirzepatide (MOUNJARO) 5 MG/0.5ML Pen Inject 5 mg into the skin once a week.  traMADol (ULTRAM) 50 MG tablet Take 1 tablet (50 mg total) by mouth 2 (two) times daily as needed for severe pain (pain score 7-10).   VITAMIN D-VITAMIN K PO Take 1 tablet by mouth every evening.   No facility-administered encounter medications on file as of 10/02/2023.     REVIEW OF SYSTEMS:  Gen: Denies fever, sweats or chills. No weight loss.  CV: Denies chest pain, palpitations or edema. Resp: Denies cough, shortness of breath of hemoptysis.  GI: Denies heartburn, dysphagia, stomach or lower abdominal pain. No diarrhea or constipation.  GU: Denies urinary burning, blood in urine, increased urinary frequency or incontinence. MS: Denies joint pain, muscles aches or weakness. Derm: Denies rash, itchiness, skin lesions or unhealing ulcers. Psych: Denies depression, anxiety, memory loss or confusion. Heme: Denies bruising, easy bleeding. Neuro:  Denies headaches, dizziness or paresthesias. Endo:  Denies any problems with DM, thyroid or adrenal function.  PHYSICAL EXAM: There were no vitals taken for this visit. General: in no acute distress. Head: Normocephalic and atraumatic. Eyes:  Sclerae non-icteric, conjunctive pink. Ears: Normal auditory acuity. Mouth: Dentition intact. No ulcers or lesions.  Neck: Supple, no lymphadenopathy or thyromegaly.  Lungs: Clear bilaterally to auscultation without wheezes, crackles or rhonchi. Heart: Regular rate and rhythm. No murmur, rub or gallop appreciated.  Abdomen: Soft, nontender, nondistended. No masses. No hepatosplenomegaly. Normoactive bowel sounds x 4 quadrants.  Rectal: Deferred.  Musculoskeletal: Symmetrical with no gross  deformities. Skin: Warm and dry. No rash or lesions on visible extremities. Extremities: No edema. Neurological: Alert oriented x 4, no focal deficits.  Psychological:  Alert and cooperative. Normal mood and affect.  ASSESSMENT AND PLAN:    CC:  Blane Ohara, MD

## 2023-10-24 ENCOUNTER — Other Ambulatory Visit: Payer: Self-pay | Admitting: Physician Assistant

## 2023-10-24 DIAGNOSIS — R350 Frequency of micturition: Secondary | ICD-10-CM

## 2023-10-26 ENCOUNTER — Other Ambulatory Visit: Payer: Self-pay | Admitting: Family Medicine

## 2023-10-26 DIAGNOSIS — E1159 Type 2 diabetes mellitus with other circulatory complications: Secondary | ICD-10-CM

## 2023-10-28 ENCOUNTER — Ambulatory Visit: Payer: Medicare Other | Admitting: Podiatry

## 2023-11-05 ENCOUNTER — Encounter: Payer: Self-pay | Admitting: Family Medicine

## 2023-11-05 ENCOUNTER — Ambulatory Visit: Payer: Medicare Other | Admitting: Family Medicine

## 2023-11-05 VITALS — BP 155/65 | HR 84 | Temp 97.8°F | Ht 63.0 in | Wt 204.0 lb

## 2023-11-05 DIAGNOSIS — Z6836 Body mass index (BMI) 36.0-36.9, adult: Secondary | ICD-10-CM

## 2023-11-05 DIAGNOSIS — I10 Essential (primary) hypertension: Secondary | ICD-10-CM | POA: Diagnosis not present

## 2023-11-05 DIAGNOSIS — R519 Headache, unspecified: Secondary | ICD-10-CM

## 2023-11-05 DIAGNOSIS — R42 Dizziness and giddiness: Secondary | ICD-10-CM | POA: Diagnosis not present

## 2023-11-05 DIAGNOSIS — E1142 Type 2 diabetes mellitus with diabetic polyneuropathy: Secondary | ICD-10-CM

## 2023-11-05 DIAGNOSIS — E66812 Obesity, class 2: Secondary | ICD-10-CM

## 2023-11-05 DIAGNOSIS — N3000 Acute cystitis without hematuria: Secondary | ICD-10-CM

## 2023-11-05 LAB — GLUCOSE, POCT (MANUAL RESULT ENTRY): POC Glucose: 77 mg/dL (ref 70–99)

## 2023-11-05 NOTE — Progress Notes (Signed)
 Subjective:  Patient ID: Caitlin [name], female    DOB: 02-Apr-1946  Age: 78 y.o. MRN: [id number]  Chief Complaint  Patient presents with   6 week f/u    Discussed the use of AI scribe software for clinical note transcription with the patient, who gave verbal consent to proceed.  History of Present Illness   Caitlin [name] is a 78 year old female with diabetes who presents for a six-week follow-up after starting Mounjaro.  She was switched from Ozempic to [facility] due to constipation with the former which has resolved. Her blood sugars are fluctuating from low to high, and she is currently taking Tresiba insulin. She checks her blood sugar at least four times a day but mentions her blood sugar meter malfunctioned recently, providing inconsistent readings.  Over the past two days, she has been experiencing dizziness and lightheadedness. No associated chest pain or shortness of breath. These symptoms are new and have not been experienced before.  Last night, she experienced a throbbing headache localized to her left temple, which was associated with a visibly enlarged and pounding vein. She took aspirin as a precaution. The throbbing has since subsided, but she notes the vein still feels larger than the right side. No further headache today. Her vision in the affected eye was sharper but is now about the same as the other eye. No previous similar episodes, weakness, or slurred speech.  She reports a recent urinary tract infection for which she was treated at [facility] in [place]. She experienced significant pain in her lower abdomen and back, prompting a visit to the ER where a urine culture was performed. Initially, she was given a medication that turns urine orange and later received a call to start an antibiotic, Keflex, after culture results. She completed the antibiotic course and reports improvement in symptoms with no current flank pain or bladder symptoms.  She mentions experiencing  leg aches similar to restless leg syndrome, which she associates with starting a new statin, atorvastatin. She has a history of restless leg syndrome and notes that her symptoms are similar to those experienced with a previous statin.         09/24/2023    8:54 AM 06/11/2023    8:09 AM 02/24/2023    7:35 AM 10/22/2022   11:03 AM 04/04/2022   11:30 AM  Depression screen PHQ 2/9  Decreased Interest 1 1 0 0 0  Down, Depressed, Hopeless 1 2 0 0 0  PHQ - 2 Score 2 3 0 0 0  Altered sleeping 1 2 0 1   Tired, decreased energy 1 2 0 1   Change in appetite 1 2 0 0   Feeling bad or failure about yourself  0 0 0 0   Trouble concentrating 0 1 0 0   Moving slowly or fidgety/restless 0 1 0 0   Suicidal thoughts 0 0 0 0   PHQ-9 Score 5 11 0 2   Difficult doing work/chores Somewhat difficult Somewhat difficult Not difficult at all Not difficult at all         09/24/2023    8:54 AM  Fall Risk   Falls in the past year? 1  Number falls in past yr: 0  Injury with Fall? 0  Risk for fall due to : No Fall Risks    Patient Care Team: Blane Ohara, MD as PCP - General (Family Medicine) Ocie Cornfield, MD as Referring Physician (Internal Medicine) Levert Feinstein, MD as Consulting Physician (Neurology)  Baldo Daub, MD as Consulting Physician (Cardiology) Annice Pih., MD (Urology) Arman Bogus, MD as Consulting Physician (Neurosurgery) Margarette Canada., DPM (Podiatry)   Review of Systems  Constitutional:  Positive for fatigue. Negative for chills and fever.  HENT:  Negative for congestion, ear pain, postnasal drip, rhinorrhea, sinus pressure, sinus pain and sore throat.   Respiratory:  Negative for cough and shortness of breath.   Cardiovascular:  Negative for chest pain.  Gastrointestinal:  Negative for abdominal pain, constipation, diarrhea, nausea and vomiting.  Genitourinary:  Negative for dysuria and urgency.  Musculoskeletal:  Negative for back pain and myalgias.  Neurological:   Positive for dizziness. Negative for weakness, light-headedness and headaches.  Psychiatric/Behavioral:  Negative for dysphoric mood. The patient is not nervous/anxious.     Current Outpatient Medications on File Prior to Visit  Medication Sig Dispense Refill   albuterol (PROVENTIL) (2.5 MG/3ML) 0.083% nebulizer solution Take 3 mLs (2.5 mg total) by nebulization every 6 (six) hours as needed for wheezing or shortness of breath. 75 mL 0   albuterol (VENTOLIN HFA) 108 (90 Base) MCG/ACT inhaler Inhale 2 puffs into the lungs every 6 (six) hours as needed for wheezing or shortness of breath. 8 g 3   atorvastatin (LIPITOR) 10 MG tablet Take 1 tablet (10 mg total) by mouth daily. 90 tablet 0   BD PEN NEEDLE NANO 2ND GEN 32G X 4 MM MISC 1 EACH BY DOES NOT APPLY ROUTE IN THE MORNING AND AT BEDTIME. 100 each 2   Budeson-Glycopyrrol-Formoterol (BREZTRI AEROSPHERE) 160-9-4.8 MCG/ACT AERO Inhale 2 puffs into the lungs in the morning and at bedtime. 10.7 g 3   buPROPion (WELLBUTRIN XL) 300 MG 24 hr tablet TAKE 1 TABLET BY MOUTH EVERY DAY IN THE MORNING 90 tablet 1   calcium carbonate (TUMS - DOSED IN MG ELEMENTAL CALCIUM) 500 MG chewable tablet Chew 2 tablets by mouth daily as needed for indigestion.      carvedilol (COREG) 6.25 MG tablet TAKE 1 TABLET BY MOUTH TWICE A DAY 180 tablet 1   Continuous Blood Gluc Receiver (FREESTYLE LIBRE 14 DAY READER) DEVI USE TO CHECK BLOOD SUGARS DAILY     Continuous Blood Gluc Sensor (FREESTYLE LIBRE 14 DAY SENSOR) MISC SMARTSIG:1 Topical Every 2 Weeks     diltiazem (CARDIZEM CD) 300 MG 24 hr capsule TAKE 1 CAPSULE BY MOUTH EVERY DAY 90 capsule 0   fexofenadine (ALLEGRA) 180 MG tablet Take 180 mg by mouth daily as needed for allergies or rhinitis.     furosemide (LASIX) 80 MG tablet TAKE 1 TABLET BY MOUTH 2 TIMES DAILY. 180 tablet 0   gabapentin (NEURONTIN) 600 MG tablet Take 1 tablet (600 mg total) by mouth 3 (three) times daily. 90 tablet 3   hydrOXYzine (VISTARIL) 25 MG  capsule TAKE 1 CAPSULE (25 MG TOTAL) BY MOUTH EVERY 8 (EIGHT) HOURS AS NEEDED FOR ANXIETY. 270 capsule 1   insulin degludec (TRESIBA FLEXTOUCH) 200 UNIT/ML FlexTouch Pen Inject 56 Units into the skin every evening. 27 mL 0   Insulin Pen Needle 31G X 8 MM MISC Inject 1 Units into the skin daily.     MYRBETRIQ 25 MG TB24 tablet TAKE 1 TABLET (25 MG TOTAL) BY MOUTH DAILY. 30 tablet 1   omega-3 acid ethyl esters (LOVAZA) 1 g capsule TAKE 2 CAPSULES BY MOUTH 2 TIMES DAILY. 360 capsule 0   omeprazole (PRILOSEC) 40 MG capsule Take 1 capsule (40 mg total) by mouth in the morning and at  bedtime. 180 capsule 1   ondansetron (ZOFRAN) 4 MG tablet Take 1 tablet (4 mg total) by mouth every 8 (eight) hours as needed for nausea or vomiting. 20 tablet 0   rivaroxaban (XARELTO) 20 MG TABS tablet Take by mouth at bedtime.     tirzepatide [facility]) 5 MG/0.5ML Pen Inject 5 mg into the skin once a week. 2 mL 0   traMADol (ULTRAM) 50 MG tablet Take 1 tablet (50 mg total) by mouth 2 (two) times daily as needed for severe pain (pain score 7-10). 60 tablet 2   VITAMIN D-VITAMIN K PO Take 1 tablet by mouth every evening.     No current facility-administered medications on file prior to visit.   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/13/2019   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) 12/13/2019   Morbid obesity (HCC) 12/13/2019   Obstructive sleep apnea    PONV (postoperative nausea  and vomiting)    "not the last few times"   RLS (restless legs syndrome)    S/P ablation of atrial flutter 02/12/2016   S/P lumbar fusion 08/04/2019   Sleep apnea    Upper respiratory tract infection due to COVID-19 virus 08/14/2021   Past Surgical History:  Procedure Laterality Date   ATRIAL FIBRILLATION ABLATION     x3   CATARACT EXTRACTION Bilateral    COLONOSCOPY  01/04/2013   Moderate predominantly sigmoid diverticulosis. Small internal hemorrhoids. Otherwise normal colonosopy.    LUMBAR WOUND DEBRIDEMENT N/A 08/09/2019   Procedure: LUMBAR WOUND DEBRIDEMENT Evacuation of  EPIDURAL HEMATOMA;  Surgeon: Tressie Stalker, MD;  Location: [facility] OR;  Service: Neurosurgery;  Laterality: N/A;    Family History  Problem Relation Age of Onset   Breast cancer Mother 66   Heart attack Father 64   Mental illness Sister    Heart Problems Brother 14   Cancer Brother    Breast cancer Paternal Grandmother    Colon cancer Paternal Aunt 5   Social History   Socioeconomic History   Marital status: Married    Spouse name: Onalee Hua   Number of children: Not on file   Years of education: Not on file   Highest education level: Not on file  Occupational History   Occupation: Retired Pensions consultant  Tobacco Use   Smoking status: Never    Passive exposure: Past   Smokeless tobacco: Never  Vaping Use   Vaping status: Never Used  Substance and Sexual Activity   Alcohol use: Not Currently   Drug use: Never   Sexual activity: Not Currently  Other Topics Concern   Not on file  Social History Narrative   Not on file   Social Drivers of Health   Financial Resource Strain: Low Risk  (04/04/2022)   Overall Financial Resource Strain (CARDIA)    Difficulty of Paying Living Expenses: Not hard at all  Food Insecurity: Low Risk  (10/11/2022)   [facility]   Hunger Vital Sign    Worried About Running Out of Food in the Last Year: Never true    Within the past 12  months, the food you bought just didn't last and you didn't have money to get more: Not on file  Transportation Needs: No Transportation Needs (10/11/2022)   [facility]   Transportation    In the past 12 months, has lack of reliable transportation kept you from medical appointments, meetings, work or from getting things needed for daily living? : No  Physical Activity: Inactive (04/04/2022)   Exercise Vital Sign    Days of Exercise per Week: 0 days    Minutes of Exercise per Session: 0 min  Stress: No Stress Concern Present (04/04/2022)   [employer] of Occupational Health - Occupational Stress Questionnaire    Feeling of Stress : Not at all  Social Connections: Moderately Integrated (11/06/2022)   Social Connection and Isolation Panel [NHANES]    Frequency of Communication with Friends and Family: Twice a week    Frequency of Social Gatherings with Friends and Family: Twice a week    Attends Religious Services: More than 4 times per year    Active Member of [employer] or Organizations: No    Attends Engineer, structural: Never    Marital Status: Married    Objective:  BP (!) 155/65   Pulse 84   Temp 97.8 F (36.6 C)   Ht 5\' 3"  (1.6 m)   Wt 204 lb (92.5 kg)   SpO2 96%   BMI 36.14 kg/m      11/05/2023    2:41 PM 11/05/2023    1:50 PM 09/24/2023    8:50 AM  BP/Weight  Systolic BP 155 120 138  Diastolic BP 65 80 86  Wt. (Lbs)  204 201  BMI  36.14 kg/m2 35.61 kg/m2    Physical Exam Vitals reviewed.  Constitutional:      Appearance: She is diaphoretic.     Comments: Disheveled.   Neck:     Vascular: No carotid bruit.  Cardiovascular:     Rate and Rhythm: Normal rate and regular rhythm.     Heart sounds: Normal heart sounds.  Pulmonary:     Effort: Pulmonary effort is normal. No respiratory distress.     Breath sounds: Normal breath sounds.  Abdominal:     General: Abdomen is flat. Bowel sounds are normal.     Palpations: Abdomen  is soft.     Tenderness: There is no abdominal tenderness.  Neurological:     Mental Status: She is alert and oriented to person, place, and time.  Psychiatric:        Mood and Affect: Mood normal.        Behavior: Behavior normal.     Diabetic Foot Exam - Simple   No data filed      Lab Results  Component Value Date   WBC 9.3 11/05/2023   HGB 14.0 11/05/2023   HCT 43.0 11/05/2023   PLT 291 11/05/2023   GLUCOSE 112 (H) 11/05/2023   CHOL 204 (H) 09/24/2023   TRIG 133 09/24/2023   HDL 50 09/24/2023   LDLCALC 130 (H) 09/24/2023   ALT 14 11/05/2023   AST 20 11/05/2023   NA 142 11/05/2023   K 4.9 11/05/2023   CL 98 11/05/2023   CREATININE 1.24 (H) 11/05/2023   BUN 21 11/05/2023   CO2 28 11/05/2023   TSH 2.950  06/11/2023   INR 0.9 08/04/2019   HGBA1C 7.3 (H) 09/24/2023      Assessment & Plan:   Diabetic peripheral neuropathy associated with type 2 diabetes mellitus (HCC) Assessment & Plan: Not at goal. Continue tresiba 56 U daily.  Continue Mounjaro 5 mg weekly.   Continue other diabetes medicines. Continue to use cgm. Recommend double check sugar if she is concerned it is inaccurate.  Continue to check feet daily.  Blood glucose levels have been fluctuating, with some readings being low. She is also on [place] insulin. There is concern about the combination of Mounjaro and insulin causing hypoglycemia. Increasing the Mounjaro dose may necessitate a reduction in insulin to prevent hypoglycemic episodes. - Monitor blood glucose levels closely, especially for hypoglycemia. - If blood glucose levels are consistently below 80 mg/dL, reduce Tresiba insulin dose to 50 units. - Provide prescription for Mounjaro.  Orders: -     POCT glucose (manual entry)  Dizziness Assessment & Plan: Check labs Glucose 77. Given glucose supplement (15 gm ) Recheck 15 minutes later - 79. Given crackers and peanut butter.  She reports dizziness and lightheadedness over the past two  days without chest pain or dyspnea. These symptoms are new, raising concern for hypoglycemia due to [facility] and insulin therapy. - Monitor symptoms and report any new or worsening symptoms such as chest pain or dyspnea. - Monitor blood glucose levels closely, especially for hypoglycemia.  Orders: -     CBC with Differential/Platelet -     Comprehensive metabolic panel  Class 2 severe obesity due to excess calories with serious comorbidity and body mass index (BMI) of 36.0 to 36.9 in adult [facility]) Assessment & Plan: Recommend continue to work on eating healthy diet and exercise. Continue mounjaro 5 mg weekly.    Essential hypertension, benign Assessment & Plan: Bp is usually very good. Second bp check increased, but she was in distress.    Acute cystitis without hematuria Assessment & Plan: Rechecked UA and was normal.    Acute nonintractable headache, unspecified headache type Assessment & Plan: Resolved, but if it recurs I would recommend we check a sed rate.       No orders of the defined types were placed in this encounter.   Orders Placed This Encounter  Procedures   CBC with Differential/Platelet   Comprehensive metabolic panel   POCT glucose (manual entry)     Follow-up: Return in about 8 weeks (around 12/29/2023) for lab visit for cgm download and UA. , chronic fasting.   I,Marla I Leal-Borjas,acting as a scribe for Blane Ohara, MD.,have documented all relevant documentation on the behalf of Blane Ohara, MD,as directed by  Blane Ohara, MD while in the presence of Blane Ohara, MD.   An After Visit Summary was printed and given to the patient.  I attest that I have reviewed this visit and agree with the plan scribed by my staff.   Blane Ohara, MD [facility] [phone]

## 2023-11-05 NOTE — Patient Instructions (Signed)
 Please bring free style libre monitor in to the office at your next visit.  Take mounjaro 5 mg weekly.  Bring sugar receiver tomorrow to down load.  Check labs today.  Get Urine tomorrow.

## 2023-11-06 ENCOUNTER — Encounter: Payer: Self-pay | Admitting: Family Medicine

## 2023-11-06 ENCOUNTER — Ambulatory Visit

## 2023-11-06 DIAGNOSIS — R350 Frequency of micturition: Secondary | ICD-10-CM | POA: Diagnosis not present

## 2023-11-06 LAB — CBC WITH DIFFERENTIAL/PLATELET
Basophils Absolute: 0.1 10*3/uL (ref 0.0–0.2)
Basos: 1 %
EOS (ABSOLUTE): 0.4 10*3/uL (ref 0.0–0.4)
Eos: 4 %
Hematocrit: 43 % (ref 34.0–46.6)
Hemoglobin: 14 g/dL (ref 11.1–15.9)
Immature Grans (Abs): 0 10*3/uL (ref 0.0–0.1)
Immature Granulocytes: 0 %
Lymphocytes Absolute: 2.5 10*3/uL (ref 0.7–3.1)
Lymphs: 27 %
MCH: 27.5 pg (ref 26.6–33.0)
MCHC: 32.6 g/dL (ref 31.5–35.7)
MCV: 85 fL (ref 79–97)
Monocytes Absolute: 1.1 10*3/uL — ABNORMAL HIGH (ref 0.1–0.9)
Monocytes: 12 %
Neutrophils Absolute: 5.2 10*3/uL (ref 1.4–7.0)
Neutrophils: 56 %
Platelets: 291 10*3/uL (ref 150–450)
RBC: 5.09 x10E6/uL (ref 3.77–5.28)
RDW: 15.8 % — ABNORMAL HIGH (ref 11.7–15.4)
WBC: 9.3 10*3/uL (ref 3.4–10.8)

## 2023-11-06 LAB — POCT URINALYSIS DIP (CLINITEK)
Bilirubin, UA: NEGATIVE
Blood, UA: NEGATIVE
Glucose, UA: NEGATIVE mg/dL
Ketones, POC UA: NEGATIVE mg/dL
Nitrite, UA: NEGATIVE
POC PROTEIN,UA: NEGATIVE
Spec Grav, UA: 1.015 (ref 1.010–1.025)
Urobilinogen, UA: 0.2 U/dL
pH, UA: 6 (ref 5.0–8.0)

## 2023-11-06 LAB — COMPREHENSIVE METABOLIC PANEL
ALT: 14 IU/L (ref 0–32)
AST: 20 IU/L (ref 0–40)
Albumin: 4.8 g/dL (ref 3.8–4.8)
Alkaline Phosphatase: 102 IU/L (ref 44–121)
BUN/Creatinine Ratio: 17 (ref 12–28)
BUN: 21 mg/dL (ref 8–27)
Bilirubin Total: 0.4 mg/dL (ref 0.0–1.2)
CO2: 28 mmol/L (ref 20–29)
Calcium: 10 mg/dL (ref 8.7–10.3)
Chloride: 98 mmol/L (ref 96–106)
Creatinine, Ser: 1.24 mg/dL — ABNORMAL HIGH (ref 0.57–1.00)
Globulin, Total: 2.4 g/dL (ref 1.5–4.5)
Glucose: 112 mg/dL — ABNORMAL HIGH (ref 70–99)
Potassium: 4.9 mmol/L (ref 3.5–5.2)
Sodium: 142 mmol/L (ref 134–144)
Total Protein: 7.2 g/dL (ref 6.0–8.5)
eGFR: 45 mL/min/{1.73_m2} — ABNORMAL LOW (ref >59)

## 2023-11-06 NOTE — Progress Notes (Signed)
 Patient is in office today for a nurse visit for Repeat UA. Patient UA was  positive for leukocytes. Patient did not stay, she dropped off urine and left, will call patient with providers recommendation.

## 2023-11-09 DIAGNOSIS — R519 Headache, unspecified: Secondary | ICD-10-CM | POA: Insufficient documentation

## 2023-11-09 DIAGNOSIS — N3000 Acute cystitis without hematuria: Secondary | ICD-10-CM | POA: Insufficient documentation

## 2023-11-09 NOTE — Assessment & Plan Note (Signed)
 Bp is usually very good. Second bp check increased, but she was in distress.

## 2023-11-09 NOTE — Assessment & Plan Note (Signed)
 Resolved, but if it recurs I would recommend we check a sed rate.

## 2023-11-09 NOTE — Assessment & Plan Note (Addendum)
 Check labs Glucose 77. Given glucose supplement (15 gm ) Recheck 15 minutes later - 79. Given crackers and peanut butter.  She reports dizziness and lightheadedness over the past two days without chest pain or dyspnea. These symptoms are new, raising concern for hypoglycemia due to [facility] and insulin therapy. - Monitor symptoms and report any new or worsening symptoms such as chest pain or dyspnea. - Monitor blood glucose levels closely, especially for hypoglycemia.

## 2023-11-09 NOTE — Assessment & Plan Note (Signed)
 Rechecked UA and was normal.

## 2023-11-09 NOTE — Assessment & Plan Note (Signed)
 Recommend continue to work on eating healthy diet and exercise. Continue mounjaro 5 mg weekly.

## 2023-11-09 NOTE — Assessment & Plan Note (Addendum)
 Not at goal. Continue tresiba 56 U daily.  Continue Mounjaro 5 mg weekly.   Continue other diabetes medicines. Continue to use cgm. Recommend double check sugar if she is concerned it is inaccurate.  Continue to check feet daily.  Blood glucose levels have been fluctuating, with some readings being low. She is also on [place] insulin. There is concern about the combination of Mounjaro and insulin causing hypoglycemia. Increasing the Mounjaro dose may necessitate a reduction in insulin to prevent hypoglycemic episodes. - Monitor blood glucose levels closely, especially for hypoglycemia. - If blood glucose levels are consistently below 80 mg/dL, reduce Tresiba insulin dose to 50 units. - Provide prescription for Mounjaro.

## 2023-11-15 ENCOUNTER — Other Ambulatory Visit: Payer: Self-pay

## 2023-11-15 DIAGNOSIS — E1159 Type 2 diabetes mellitus with other circulatory complications: Secondary | ICD-10-CM

## 2023-11-17 ENCOUNTER — Other Ambulatory Visit

## 2023-11-17 DIAGNOSIS — E1159 Type 2 diabetes mellitus with other circulatory complications: Secondary | ICD-10-CM

## 2023-11-18 ENCOUNTER — Other Ambulatory Visit: Payer: Self-pay | Admitting: Physician Assistant

## 2023-11-18 DIAGNOSIS — R350 Frequency of micturition: Secondary | ICD-10-CM

## 2023-11-18 LAB — COMPREHENSIVE METABOLIC PANEL WITH GFR
ALT: 13 IU/L (ref 0–32)
AST: 15 IU/L (ref 0–40)
Albumin: 4.4 g/dL (ref 3.8–4.8)
Alkaline Phosphatase: 100 IU/L (ref 44–121)
BUN/Creatinine Ratio: 20 (ref 12–28)
BUN: 18 mg/dL (ref 8–27)
Bilirubin Total: <0.2 mg/dL (ref 0.0–1.2)
CO2: 28 mmol/L (ref 20–29)
Calcium: 9.6 mg/dL (ref 8.7–10.3)
Chloride: 99 mmol/L (ref 96–106)
Creatinine, Ser: 0.91 mg/dL (ref 0.57–1.00)
Globulin, Total: 2.2 g/dL (ref 1.5–4.5)
Glucose: 106 mg/dL — ABNORMAL HIGH (ref 70–99)
Potassium: 5.1 mmol/L (ref 3.5–5.2)
Sodium: 142 mmol/L (ref 134–144)
Total Protein: 6.6 g/dL (ref 6.0–8.5)
eGFR: 65 mL/min/{1.73_m2} (ref >59)

## 2023-11-25 ENCOUNTER — Ambulatory Visit: Payer: Medicare Other

## 2023-11-25 DIAGNOSIS — L84 Corns and callosities: Secondary | ICD-10-CM

## 2023-11-25 DIAGNOSIS — M2141 Flat foot [pes planus] (acquired), right foot: Secondary | ICD-10-CM

## 2023-11-25 DIAGNOSIS — Z794 Long term (current) use of insulin: Secondary | ICD-10-CM

## 2023-11-25 DIAGNOSIS — M21611 Bunion of right foot: Secondary | ICD-10-CM

## 2023-11-28 NOTE — Progress Notes (Signed)
 Patient presents to the office today for diabetic shoe and insole measuring.  Patient was measured with brannock device to determine size and width for 1 pair of extra depth shoes and foam casted for 3 pair of insoles.   Documentation of medical necessity will be sent to patient's treating diabetic doctor to verify and sign.   Patient's diabetic provider: Blane Ohara MD   Shoes and insoles will be ordered at that time and patient will be notified for an appointment for fitting when they arrive.   Shoe size (per patient): 7 Brannock measurement: 7 Shoe choice:   A720W / 825 Shoe size ordered: 7WD  [employer] signed  Addison Bailey CPed, CFo, CFm  Order on iPad saved will send when docs are received

## 2023-12-02 ENCOUNTER — Telehealth: Payer: Self-pay | Admitting: Family Medicine

## 2023-12-02 NOTE — Telephone Encounter (Signed)
 [facility] Therapeutic Shoe Order

## 2023-12-04 ENCOUNTER — Other Ambulatory Visit: Payer: Self-pay | Admitting: Family Medicine

## 2023-12-04 DIAGNOSIS — K219 Gastro-esophageal reflux disease without esophagitis: Secondary | ICD-10-CM

## 2023-12-08 ENCOUNTER — Ambulatory Visit (INDEPENDENT_AMBULATORY_CARE_PROVIDER_SITE_OTHER): Admitting: Podiatry

## 2023-12-08 DIAGNOSIS — M79674 Pain in right toe(s): Secondary | ICD-10-CM

## 2023-12-08 DIAGNOSIS — E1140 Type 2 diabetes mellitus with diabetic neuropathy, unspecified: Secondary | ICD-10-CM

## 2023-12-08 DIAGNOSIS — B351 Tinea unguium: Secondary | ICD-10-CM | POA: Diagnosis not present

## 2023-12-08 DIAGNOSIS — Z794 Long term (current) use of insulin: Secondary | ICD-10-CM | POA: Diagnosis not present

## 2023-12-08 DIAGNOSIS — M79675 Pain in left toe(s): Secondary | ICD-10-CM | POA: Diagnosis not present

## 2023-12-08 DIAGNOSIS — L84 Corns and callosities: Secondary | ICD-10-CM

## 2023-12-08 DIAGNOSIS — R234 Changes in skin texture: Secondary | ICD-10-CM

## 2023-12-08 MED ORDER — AMMONIUM LACTATE 12 % EX CREA: 1.0000 | TOPICAL_CREAM | CUTANEOUS | 0 refills | Status: AC | PRN

## 2023-12-08 MED ORDER — MUPIROCIN 2 % EX OINT: 1.0000 | TOPICAL_OINTMENT | Freq: Two times a day (BID) | CUTANEOUS | 2 refills | Status: AC

## 2023-12-08 NOTE — Progress Notes (Signed)
 Subjective:  Patient ID: Caitlin [name], female    DOB: 30-May-1946,  MRN: [id number]   Caitlin [name] presents to clinic today for:  Chief Complaint  Patient presents with   [facility]    [facility] with callous.  Last A1c was 7.7,  two months ago.  No anti coag.   Patient is a 78 year old female who comes in due to painful callus underneath the right first metatarsal head region.  She also presents with painful, thickened, elongated dystrophic toenails which are tender with shoe gear.  She is diabetic, last A1c 7.7.  She does endorse numbness and tingling in her toes.  PCP is Cox, Burleigh Carp, MD.  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/13/2019   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) 12/13/2019   Morbid obesity (HCC) 12/13/2019   Obstructive sleep apnea    PONV (postoperative nausea and vomiting)    "not the last few times"   RLS (restless legs syndrome)    S/P ablation of atrial flutter 02/12/2016   S/P lumbar fusion 08/04/2019   Sleep apnea    Upper respiratory tract infection due to COVID-19 virus 08/14/2021    Past Surgical History:  Procedure Laterality Date   ATRIAL FIBRILLATION ABLATION     x3   CATARACT EXTRACTION Bilateral    COLONOSCOPY  01/04/2013   Moderate predominantly sigmoid diverticulosis. Small internal hemorrhoids. Otherwise normal colonosopy.    LUMBAR WOUND DEBRIDEMENT N/A 08/09/2019   Procedure: LUMBAR WOUND DEBRIDEMENT  Evacuation of  EPIDURAL HEMATOMA;  Surgeon: Garry Kansas, MD;  Location: [facility] OR;  Service: Neurosurgery;  Laterality: N/A;    Allergies  Allergen Reactions   Valsartan  Swelling   Ozempic  (0.25 Or 0.5 Mg-Dose) [Semaglutide (0.25 Or 0.5mg -Dos)]     Constipation.   Codeine Nausea Only    Review of Systems: Negative except as noted in the HPI.  Objective:  There were no vitals filed for this visit.  Caitlin [name] is a pleasant 78 y.o. female in NAD. AAO x 3.  Vascular Examination: Capillary refill time approximately 3 seconds to toes bilateral.  Palpable DP pulses, nonpalpable PT pulses b/l LE. Digital hair absent b/l.  Skin temperature gradient WNL b/l.  Varicosities present b/l. No cyanosis noted b/l.   Dermatological Examination: Fissure present right first metatarsal head plantar aspect with significant hyperkeratotic tissue.  Painful on palpation.  No full-thickness skin breakdown today.  Preulcerative in nature.  No significant erythema or edema to the right foot or left foot.  Nails x 10 are within normal limits for length, increased thickness and discoloration is noted.  Neurological Examination: Vibratory sensation diminished bilaterally.  Protective sensation diminished bilaterally.  Musculoskeletal Examination: Muscle strength 5/5 to all LE muscle groups b/l.  Significant bunion deformities present right greater than left.     Latest Ref Rng & Units 09/24/2023   10:09 AM 06/11/2023    9:11 AM 02/24/2023  8:21 AM  Hemoglobin A1C  Hemoglobin-A1c 4.8 - 5.6 % 7.3  8.5  8.5      Assessment/Plan: 1. Type 2 diabetes mellitus with diabetic neuropathy, with long-term current use of insulin  (HCC)   2. Pain due to onychomycosis of toenails of both feet   3. Fissure in skin of right foot   4. Pre-ulcerative calluses     Meds ordered this encounter  Medications   mupirocin  ointment (BACTROBAN ) 2 %    Sig: Apply 1 Application topically 2 (two) times daily.    Dispense:  30  g    Refill:  2   ammonium lactate  (AMLACTIN) 12 % cream    Sig: Apply 1 Application topically as needed for dry skin.    Dispense:  385 g    Refill:  0   None  Plan: - Preulcerative fissure was sharply debrided using 312 scalpel blade.  No full-thickness skin breakdown today.  Mupirocin  and bandage applied.  Rx sent to patient's pharmacy for mupirocin  to be applied to the fissured skin, AmLactin prescribed to help manage and soften the callus. -Dancers pad dispensed to the patient to offload the right foot lesion.  -Discussed strategies for home management and offloading such as skin moisturization, white vinegar scrubs, felt or foam offloading pads as dispensed, shoe gear modifications, custom inserts or shoes. -Nails x 10 were debrided in thickness and length using sterile nail nippers without incident.  Mechanical bur used to file down the nails - Patient educated on diabetes. Discussed proper diabetic foot care and discussed risks and complications of disease. Educated patient in depth on reasons to return to the office immediately should he/she discover anything concerning or new on the feet. All questions answered. Discussed proper shoes as well.     Return in about 9 weeks (around 02/09/2024) for Diabetic Foot Care.   Eve Hinders, DPM, AACFAS [facility]     [address] [place], [place] [address]                Office [phone]  Fax [phone]   right subfirst fissure, debrided today  Nails Right foot bunion

## 2023-12-09 ENCOUNTER — Encounter: Payer: Self-pay | Admitting: Podiatry

## 2023-12-20 ENCOUNTER — Other Ambulatory Visit: Payer: Self-pay | Admitting: Family Medicine

## 2023-12-20 DIAGNOSIS — E782 Mixed hyperlipidemia: Secondary | ICD-10-CM

## 2023-12-21 ENCOUNTER — Other Ambulatory Visit: Payer: Self-pay | Admitting: Family Medicine

## 2023-12-21 DIAGNOSIS — E1140 Type 2 diabetes mellitus with diabetic neuropathy, unspecified: Secondary | ICD-10-CM

## 2023-12-30 ENCOUNTER — Telehealth: Payer: Self-pay

## 2023-12-30 NOTE — Telephone Encounter (Signed)
 Called patient to talk about the appointment for the lab. Change the appointment to see a provider. Voicemail is full.

## 2023-12-30 NOTE — Progress Notes (Unsigned)
 Subjective:  Patient ID: Caitlin [name], female    DOB: [birth year]  Age: 78 y.o. MRN: [id number]  Chief Complaint  Patient presents with   Medical Management of Chronic Issues    UA recheck Hypertension Diabetes    HPI:     09/24/2023    8:54 AM 06/11/2023    8:09 AM 02/24/2023    7:35 AM 10/22/2022   11:03 AM 04/04/2022   11:30 AM  Depression screen PHQ 2/9  Decreased Interest 1 1 0 0 0  Down, Depressed, Hopeless 1 2 0 0 0  PHQ - 2 Score 2 3 0 0 0  Altered sleeping 1 2 0 1   Tired, decreased energy 1 2 0 1   Change in appetite 1 2 0 0   Feeling bad or failure about yourself  0 0 0 0   Trouble concentrating 0 1 0 0   Moving slowly or fidgety/restless 0 1 0 0   Suicidal thoughts 0 0 0 0   PHQ-9 Score 5 11 0 2   Difficult doing work/chores Somewhat difficult Somewhat difficult Not difficult at all Not difficult at all         09/24/2023    8:54 AM  Fall Risk   Falls in the past year? 1  Number falls in past yr: 0  Injury with Fall? 0  Risk for fall due to : No Fall Risks    Patient Care Team: Mercy Stall, MD as PCP - General (Family Medicine) Laurence Pons, MD as Referring Physician (Internal Medicine) Phebe Brasil, MD as Consulting Physician (Neurology) Hassan Links, MD as Consulting Physician (Cardiology) Randol Butt., MD (Urology) Joaquin Mulberry, MD as Consulting Physician (Neurosurgery) Traci Fridge., DPM (Podiatry)   Review of Systems  Constitutional:  Negative for chills, diaphoresis, fatigue and fever.  HENT:  Negative for congestion, ear pain and sinus pain.   Eyes: Negative.   Respiratory:  Negative for cough and shortness of breath.   Cardiovascular:  Negative for chest pain.  Gastrointestinal:  Negative for abdominal pain, constipation, nausea and vomiting.  Endocrine: Negative.   Genitourinary:  Negative for dysuria.  Musculoskeletal:  Negative for arthralgias.  Skin: Negative.   Allergic/Immunologic: Negative.   Neurological:   Negative for weakness and headaches.  Hematological: Negative.   Psychiatric/Behavioral:  Negative for dysphoric mood. The patient is not nervous/anxious.     Current Outpatient Medications on File Prior to Visit  Medication Sig Dispense Refill   albuterol  (PROVENTIL ) (2.5 MG/3ML) 0.083% nebulizer solution Take 3 mLs (2.5 mg total) by nebulization every 6 (six) hours as needed for wheezing or shortness of breath. 75 mL 0   albuterol  (VENTOLIN  HFA) 108 (90 Base) MCG/ACT inhaler Inhale 2 puffs into the lungs every 6 (six) hours as needed for wheezing or shortness of breath. 8 g 3   ammonium lactate  (AMLACTIN) 12 % cream Apply 1 Application topically as needed for dry skin. 385 g 0   atorvastatin  (LIPITOR) 10 MG tablet TAKE 1 TABLET BY MOUTH EVERY DAY 90 tablet 0   BD PEN NEEDLE NANO 2ND GEN 32G X 4 MM MISC 1 EACH BY DOES NOT APPLY ROUTE IN THE MORNING AND AT BEDTIME. 100 each 2   Budeson-Glycopyrrol-Formoterol  (BREZTRI  AEROSPHERE) 160-9-4.8 MCG/ACT AERO Inhale 2 puffs into the lungs in the morning and at bedtime. 10.7 g 3   buPROPion  (WELLBUTRIN  XL) 300 MG 24 hr tablet TAKE 1 TABLET BY MOUTH EVERY DAY IN THE MORNING 90 tablet  1   calcium  carbonate (TUMS - DOSED IN MG ELEMENTAL CALCIUM ) 500 MG chewable tablet Chew 2 tablets by mouth daily as needed for indigestion.      carvedilol  (COREG ) 6.25 MG tablet TAKE 1 TABLET BY MOUTH TWICE A DAY 180 tablet 1   Continuous Blood Gluc Receiver (FREESTYLE LIBRE 14 DAY READER) DEVI USE TO CHECK BLOOD SUGARS DAILY     Continuous Blood Gluc Sensor (FREESTYLE LIBRE 14 DAY SENSOR) MISC SMARTSIG:1 Topical Every 2 Weeks     diltiazem  (CARDIZEM  CD) 300 MG 24 hr capsule TAKE 1 CAPSULE BY MOUTH EVERY DAY 90 capsule 0   fexofenadine (ALLEGRA) 180 MG tablet Take 180 mg by mouth daily as needed for allergies or rhinitis.     furosemide  (LASIX ) 80 MG tablet TAKE 1 TABLET BY MOUTH 2 TIMES DAILY. 180 tablet 0   gabapentin  (NEURONTIN ) 600 MG tablet Take 1 tablet (600 mg total)  by mouth 3 (three) times daily. 90 tablet 3   hydrOXYzine  (VISTARIL ) 25 MG capsule TAKE 1 CAPSULE (25 MG TOTAL) BY MOUTH EVERY 8 (EIGHT) HOURS AS NEEDED FOR ANXIETY. 270 capsule 1   insulin  degludec (TRESIBA  FLEXTOUCH) 200 UNIT/ML FlexTouch Pen Inject 56 Units into the skin every evening. 27 mL 0   Insulin  Pen Needle 31G X 8 MM MISC Inject 1 Units into the skin daily.     mupirocin  ointment (BACTROBAN ) 2 % Apply 1 Application topically 2 (two) times daily. 30 g 2   MYRBETRIQ  25 MG TB24 tablet TAKE 1 TABLET (25 MG TOTAL) BY MOUTH DAILY. 90 tablet 1   omega-3 acid ethyl esters (LOVAZA ) 1 g capsule TAKE 2 CAPSULES BY MOUTH 2 TIMES DAILY. 360 capsule 0   omeprazole  (PRILOSEC) 40 MG capsule TAKE 1 CAPSULE (40 MG TOTAL) BY MOUTH IN THE MORNING AND AT BEDTIME. 180 capsule 1   ondansetron  (ZOFRAN ) 4 MG tablet Take 1 tablet (4 mg total) by mouth every 8 (eight) hours as needed for nausea or vomiting. 20 tablet 0   rivaroxaban (XARELTO) 20 MG TABS tablet Take by mouth at bedtime.     tirzepatide (MOUNJARO) 5 MG/0.5ML Pen Inject 5 mg into the skin once a week. 2 mL 0   traMADol  (ULTRAM ) 50 MG tablet Take 1 tablet (50 mg total) by mouth 2 (two) times daily as needed for severe pain (pain score 7-10). 60 tablet 2   VITAMIN D-VITAMIN K PO Take 1 tablet by mouth every evening.     No current facility-administered medications on file prior to visit.   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)  12/13/2019   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) 12/13/2019   Morbid obesity (HCC) 12/13/2019   Obstructive sleep apnea    PONV (postoperative nausea and vomiting)    "not the last few times"   RLS (restless legs syndrome)    S/P ablation of atrial flutter 02/12/2016   S/P lumbar fusion 08/04/2019  Sleep apnea    Upper respiratory tract infection due to COVID-19 virus 08/14/2021   Past Surgical History:  Procedure Laterality Date   ATRIAL FIBRILLATION ABLATION     x3   CATARACT EXTRACTION Bilateral    COLONOSCOPY  01/04/2013   Moderate predominantly sigmoid diverticulosis. Small internal hemorrhoids. Otherwise normal colonosopy.    LUMBAR WOUND DEBRIDEMENT N/A 08/09/2019   Procedure: LUMBAR WOUND DEBRIDEMENT Evacuation of  EPIDURAL HEMATOMA;  Surgeon: Garry Kansas, MD;  Location: [facility] OR;  Service: Neurosurgery;  Laterality: N/A;    Family History  Problem Relation Age of Onset   Breast cancer Mother 61   Heart attack Father 29   Mental illness Sister    Heart Problems Brother 35   Cancer Brother    Breast cancer Paternal Grandmother    Colon cancer Paternal Aunt 76   Social History   Socioeconomic History   Marital status: Married    Spouse name: Myrtie Atkinson   Number of children: Not on file   Years of education: Not on file   Highest education level: Not on file  Occupational History   Occupation: Retired Pensions consultant  Tobacco Use   Smoking status: Never    Passive exposure: Past   Smokeless tobacco: Never  Vaping Use   Vaping status: Never Used  Substance and Sexual Activity   Alcohol use: Not Currently   Drug use: Never   Sexual activity: Not Currently  Other Topics Concern   Not on file  Social History Narrative   Not on file   Social Drivers of Health   Financial Resource Strain: Low Risk  (04/04/2022)   Overall Financial Resource Strain (CARDIA)    Difficulty of Paying Living Expenses: Not hard at all  Food Insecurity:  Low Risk  (10/11/2022)   [facility]   Hunger Vital Sign    Worried About Running Out of Food in the Last Year: Never true    Within the past 12 months, the food you bought just didn't last and you didn't have money to get more: Not on file  Transportation Needs: No Transportation Needs (10/11/2022)   [facility]   Transportation    In the past 12 months, has lack of reliable transportation kept you from medical appointments, meetings, work or from getting things needed for daily living? : No  Physical Activity: Inactive (04/04/2022)   Exercise Vital Sign    Days of Exercise per Week: 0 days    Minutes of Exercise per Session: 0 min  Stress: No Stress Concern Present (04/04/2022)   [employer] of Occupational Health - Occupational Stress Questionnaire    Feeling of Stress : Not at all  Social Connections: Moderately Integrated (11/06/2022)   Social Connection and Isolation Panel [NHANES]    Frequency of Communication with Friends and Family: Twice a week    Frequency of Social Gatherings with Friends and Family: Twice a week    Attends Religious Services: More than 4 times per year    Active Member of [employer] or Organizations: No    Attends Banker Meetings: Never    Marital Status: Married    Objective:  There were no vitals taken for this visit.     11/05/2023    2:41 PM 11/05/2023    1:50 PM 09/24/2023    8:50 AM  BP/Weight  Systolic BP 155 120 138  Diastolic BP 65 80 86  Wt. (Lbs)  204 201  BMI  36.14 kg/m2 35.61 kg/m2    Physical Exam  Diabetic Foot Exam - Simple   No data filed      Lab Results  Component Value Date   WBC 9.3 11/05/2023   HGB 14.0 11/05/2023   HCT 43.0 11/05/2023   PLT 291 11/05/2023   GLUCOSE 106 (H) 11/17/2023   CHOL 204 (H) 09/24/2023   TRIG 133 09/24/2023   HDL 50 09/24/2023   LDLCALC 130 (H) 09/24/2023   ALT 13 11/17/2023   AST 15 11/17/2023   NA 142  11/17/2023   K 5.1 11/17/2023   CL 99 11/17/2023   CREATININE 0.91 11/17/2023   BUN 18 11/17/2023   CO2 28 11/17/2023   TSH 2.950 06/11/2023   INR 0.9 08/04/2019   HGBA1C 7.3 (H) 09/24/2023      Assessment & Plan:  There are no diagnoses linked to this encounter.   No orders of the defined types were placed in this encounter.   No orders of the defined types were placed in this encounter.    Follow-up: No follow-ups on file.   I,Angela Taylor,acting as a Neurosurgeon for Janece Means, FNP.,have documented all relevant documentation on the behalf of Janece Means, FNP,as directed by  Janece Means, FNP while in the presence of Janece Means, FNP.   An After Visit Summary was printed and given to the patient.  Janece Means, FNP [facility] [phone]

## 2023-12-31 ENCOUNTER — Ambulatory Visit: Admitting: Family Medicine

## 2023-12-31 ENCOUNTER — Encounter: Payer: Self-pay | Admitting: Family Medicine

## 2023-12-31 VITALS — BP 130/70 | HR 92 | Temp 98.5°F | Resp 16 | Ht 63.0 in | Wt 210.2 lb

## 2023-12-31 DIAGNOSIS — R3915 Urgency of urination: Secondary | ICD-10-CM | POA: Insufficient documentation

## 2023-12-31 DIAGNOSIS — E1142 Type 2 diabetes mellitus with diabetic polyneuropathy: Secondary | ICD-10-CM | POA: Diagnosis not present

## 2023-12-31 DIAGNOSIS — I480 Paroxysmal atrial fibrillation: Secondary | ICD-10-CM | POA: Diagnosis not present

## 2023-12-31 DIAGNOSIS — E785 Hyperlipidemia, unspecified: Secondary | ICD-10-CM

## 2023-12-31 DIAGNOSIS — E1169 Type 2 diabetes mellitus with other specified complication: Secondary | ICD-10-CM | POA: Diagnosis not present

## 2023-12-31 DIAGNOSIS — M25552 Pain in left hip: Secondary | ICD-10-CM | POA: Insufficient documentation

## 2023-12-31 DIAGNOSIS — R0602 Shortness of breath: Secondary | ICD-10-CM | POA: Insufficient documentation

## 2023-12-31 DIAGNOSIS — I10 Essential (primary) hypertension: Secondary | ICD-10-CM | POA: Diagnosis not present

## 2023-12-31 DIAGNOSIS — Z87440 Personal history of urinary (tract) infections: Secondary | ICD-10-CM | POA: Insufficient documentation

## 2023-12-31 DIAGNOSIS — Z8639 Personal history of other endocrine, nutritional and metabolic disease: Secondary | ICD-10-CM | POA: Insufficient documentation

## 2023-12-31 DIAGNOSIS — M898X5 Other specified disorders of bone, thigh: Secondary | ICD-10-CM | POA: Insufficient documentation

## 2023-12-31 MED ORDER — TIRZEPATIDE 5 MG/0.5ML ~~LOC~~ SOAJ: 5.0000 mg | SUBCUTANEOUS | 0 refills | Status: DC

## 2023-12-31 NOTE — Assessment & Plan Note (Signed)
 Not at goal. Continue tresiba  56 U daily.  Start Mounjaro 5 mg weekly, resent Rx to [facility] as she stated that she was still taking the 2.5 mg dose.  Continue other diabetes medicines. Continue to use cgm. Recommend double check sugar if she is concerned it is inaccurate.  Continue to check feet daily.  - Labs drawn today FU in 1 month

## 2023-12-31 NOTE — Assessment & Plan Note (Addendum)
 Recent shortness of breath, no reports of palpitations, no recent EKG or cardiology visit since 2022. No current chest pain or significant leg swelling, recent leg swelling managed with Lasix . - Continue taking Xarelto 20 mg by mouth one at bedtime and Cardizem  300 mg by mouth once daily. EKG ordered EKG - NSR 88, PR 190, QRS 90, QTc - 469, No ST elevation or depression.

## 2023-12-31 NOTE — Assessment & Plan Note (Signed)
 EKG ordered EKG - NSR 88, PR 190, QRS 90, QTc - 469, No ST elevation or depression.  - Continue medication as ordered

## 2023-12-31 NOTE — Assessment & Plan Note (Signed)
 Significant urgency and frequency, possibly related to elevated blood sugar. No dysuria, history of spasming. Differential includes UTI. - Perform urinalysis and microalbumin test. - Provide urine collection container for home use if unable to provide sample today.

## 2023-12-31 NOTE — Assessment & Plan Note (Signed)
 Control: worsened. Cholesterol greatly improved.  Continue CGM Recommend check feet daily. Recommend annual eye exams. Medicines: tresiba  56 U daily, Mounjaro 5.0 mg SQ weekly. Continue crestor  40 mg daily and Vascepa  1 gm 2 capsules twice daily.  Continue to work on eating a healthy diet and exercise.  Labs drawn today.   Fu in 1 month

## 2023-12-31 NOTE — Assessment & Plan Note (Signed)
 BP was slightly elevated when she arrived. She did not take her BP medication this morning, but her BP came down before she left.  BP Readings from Last 3 Encounters:  12/31/23 130/70  11/05/23 (!) 155/65  09/24/23 138/86  - Continue Coreg  6.25 mg by mouth once daily - Continue Lasix  80 mg by mouth  TWICE A DAY for swelling as needed.

## 2024-01-02 ENCOUNTER — Other Ambulatory Visit: Payer: Self-pay

## 2024-01-02 ENCOUNTER — Other Ambulatory Visit (INDEPENDENT_AMBULATORY_CARE_PROVIDER_SITE_OTHER): Payer: Self-pay | Admitting: Family Medicine

## 2024-01-02 DIAGNOSIS — R3915 Urgency of urination: Secondary | ICD-10-CM | POA: Diagnosis not present

## 2024-01-02 DIAGNOSIS — R350 Frequency of micturition: Secondary | ICD-10-CM

## 2024-01-02 DIAGNOSIS — E1142 Type 2 diabetes mellitus with diabetic polyneuropathy: Secondary | ICD-10-CM

## 2024-01-02 DIAGNOSIS — R300 Dysuria: Secondary | ICD-10-CM

## 2024-01-02 LAB — POCT URINALYSIS DIP (CLINITEK)
Bilirubin, UA: NEGATIVE
Blood, UA: NEGATIVE
Glucose, UA: NEGATIVE mg/dL
Ketones, POC UA: NEGATIVE mg/dL
Leukocytes, UA: NEGATIVE
Nitrite, UA: NEGATIVE
POC PROTEIN,UA: NEGATIVE
Spec Grav, UA: 1.01 (ref 1.010–1.025)
Urobilinogen, UA: 0.2 U/dL
pH, UA: 7.5 (ref 5.0–8.0)

## 2024-01-03 LAB — LIPID PANEL
Chol/HDL Ratio: 4.6 ratio — ABNORMAL HIGH (ref 0.0–4.4)
Cholesterol, Total: 235 mg/dL — ABNORMAL HIGH (ref 100–199)
HDL: 51 mg/dL (ref >39)
LDL Chol Calc (NIH): 147 mg/dL — ABNORMAL HIGH (ref 0–99)
Triglycerides: 207 mg/dL — ABNORMAL HIGH (ref 0–149)
VLDL Cholesterol Cal: 37 mg/dL (ref 5–40)

## 2024-01-03 LAB — MICROALBUMIN / CREATININE URINE RATIO
Creatinine, Urine: 84 mg/dL
Microalb/Creat Ratio: 10 mg/g{creat} (ref 0–29)
Microalbumin, Urine: 8.2 ug/mL

## 2024-01-03 LAB — SPECIMEN STATUS REPORT

## 2024-01-03 LAB — HEMOGLOBIN A1C
Est. average glucose Bld gHb Est-mCnc: 177 mg/dL
Hgb A1c MFr Bld: 7.8 % — ABNORMAL HIGH (ref 4.8–5.6)

## 2024-01-05 ENCOUNTER — Ambulatory Visit: Payer: Self-pay | Admitting: Family Medicine

## 2024-01-05 DIAGNOSIS — N3001 Acute cystitis with hematuria: Secondary | ICD-10-CM

## 2024-01-05 LAB — URINE CULTURE

## 2024-01-05 MED ORDER — CIPROFLOXACIN HCL 500 MG PO TABS: 500.0000 mg | ORAL_TABLET | Freq: Two times a day (BID) | ORAL | 0 refills | Status: AC

## 2024-01-14 ENCOUNTER — Telehealth: Payer: Self-pay | Admitting: Podiatry

## 2024-01-14 NOTE — Telephone Encounter (Signed)
 Patient wants to know if orthotics have been delivered.

## 2024-01-15 NOTE — Telephone Encounter (Signed)
 Pt Caitlin [name] checking on the status of her DM shoes and inserts.Aaron Aas upon checking safestep we are still waiting on them to finish her order.. called pt to inform

## 2024-01-26 ENCOUNTER — Ambulatory Visit: Payer: Self-pay | Admitting: Family Medicine

## 2024-01-27 ENCOUNTER — Ambulatory Visit: Payer: Self-pay | Admitting: Family Medicine

## 2024-02-02 ENCOUNTER — Ambulatory Visit: Payer: Self-pay | Admitting: Family Medicine

## 2024-02-02 ENCOUNTER — Ambulatory Visit (INDEPENDENT_AMBULATORY_CARE_PROVIDER_SITE_OTHER): Admitting: Family Medicine

## 2024-02-02 ENCOUNTER — Encounter: Payer: Self-pay | Admitting: Family Medicine

## 2024-02-02 ENCOUNTER — Ambulatory Visit (HOSPITAL_BASED_OUTPATIENT_CLINIC_OR_DEPARTMENT_OTHER)
Admission: RE | Admit: 2024-02-02 | Discharge: 2024-02-02 | Disposition: A | Discharge status: Home / Self Care | Source: Ambulatory Visit | Attending: Family Medicine | Admitting: Family Medicine

## 2024-02-02 VITALS — BP 136/70 | HR 83 | Temp 98.2°F | Ht 63.0 in | Wt 210.0 lb

## 2024-02-02 DIAGNOSIS — M5412 Radiculopathy, cervical region: Secondary | ICD-10-CM

## 2024-02-02 DIAGNOSIS — M545 Low back pain, unspecified: Secondary | ICD-10-CM

## 2024-02-02 DIAGNOSIS — M25552 Pain in left hip: Secondary | ICD-10-CM

## 2024-02-02 DIAGNOSIS — S42025A Nondisplaced fracture of shaft of left clavicle, initial encounter for closed fracture: Secondary | ICD-10-CM

## 2024-02-02 DIAGNOSIS — M542 Cervicalgia: Secondary | ICD-10-CM | POA: Diagnosis not present

## 2024-02-02 MED ORDER — TRAMADOL HCL 50 MG PO TABS: 50.0000 mg | ORAL_TABLET | Freq: Four times a day (QID) | ORAL | 0 refills | Status: DC | PRN

## 2024-02-02 NOTE — Progress Notes (Unsigned)
 Subjective:  Patient ID: Caitlin [name], female    DOB: [birth year]  Age: 78 y.o. MRN: [id number]  Chief Complaint  Patient presents with   Hospitalization Follow-up    HPI:  Patient presents today for hospital follow up. See encounters for information.    Complaining of low back pain and lower abdomen and radiates down BL legs. Her legs ache.  Interrupting sleep numerous time. Has urgency.      09/24/2023    8:54 AM 06/11/2023    8:09 AM 02/24/2023    7:35 AM 10/22/2022   11:03 AM 04/04/2022   11:30 AM  Depression screen PHQ 2/9  Decreased Interest 1 1 0 0 0  Down, Depressed, Hopeless 1 2 0 0 0  PHQ - 2 Score 2 3 0 0 0  Altered sleeping 1 2 0 1   Tired, decreased energy 1 2 0 1   Change in appetite 1 2 0 0   Feeling bad or failure about yourself  0 0 0 0   Trouble concentrating 0 1 0 0   Moving slowly or fidgety/restless 0 1 0 0   Suicidal thoughts 0 0 0 0   PHQ-9 Score 5 11 0 2   Difficult doing work/chores Somewhat difficult Somewhat difficult Not difficult at all Not difficult at all         09/24/2023    8:54 AM  Fall Risk   Falls in the past year? 1  Number falls in past yr: 0  Injury with Fall? 0  Risk for fall due to : No Fall Risks    Patient Care Team: Sherre Clapper, MD as PCP - General (Family Medicine) Braulio Hough, MD as Referring Physician (Internal Medicine) Onita Duos, MD as Consulting Physician (Neurology) Monetta Redell PARAS, MD as Consulting Physician (Cardiology) Steen Evalene CROME., MD (Urology) Joshua Alm Hamilton, MD as Consulting Physician (Neurosurgery) Myrick Elspeth PARAS., DPM (Podiatry)   Review of Systems  Constitutional:  Negative for chills and fever.  HENT:  Negative for congestion and sore throat.   Respiratory:  Negative for cough and shortness of breath.   Cardiovascular:  Negative for chest pain.  Gastrointestinal:  Negative for abdominal pain and nausea.  Genitourinary:  Positive for frequency and urgency. Negative for dysuria and  flank pain.  Musculoskeletal:  Positive for back pain.    Current Outpatient Medications on File Prior to Visit  Medication Sig Dispense Refill   albuterol  (PROVENTIL ) (2.5 MG/3ML) 0.083% nebulizer solution Take 3 mLs (2.5 mg total) by nebulization every 6 (six) hours as needed for wheezing or shortness of breath. 75 mL 0   albuterol  (VENTOLIN  HFA) 108 (90 Base) MCG/ACT inhaler Inhale 2 puffs into the lungs every 6 (six) hours as needed for wheezing or shortness of breath. 8 g 3   ammonium lactate  (AMLACTIN) 12 % cream Apply 1 Application topically as needed for dry skin. 385 g 0   atorvastatin  (LIPITOR) 10 MG tablet TAKE 1 TABLET BY MOUTH EVERY DAY 90 tablet 0   BD PEN NEEDLE NANO 2ND GEN 32G X 4 MM MISC 1 EACH BY DOES NOT APPLY ROUTE IN THE MORNING AND AT BEDTIME. 100 each 2   Budeson-Glycopyrrol-Formoterol  (BREZTRI  AEROSPHERE) 160-9-4.8 MCG/ACT AERO Inhale 2 puffs into the lungs in the morning and at bedtime. 10.7 g 3   buPROPion  (WELLBUTRIN  XL) 300 MG 24 hr tablet TAKE 1 TABLET BY MOUTH EVERY DAY IN THE MORNING 90 tablet 1   calcium  carbonate (TUMS - DOSED IN  MG ELEMENTAL CALCIUM ) 500 MG chewable tablet Chew 2 tablets by mouth daily as needed for indigestion.      carvedilol  (COREG ) 6.25 MG tablet TAKE 1 TABLET BY MOUTH TWICE A DAY 180 tablet 1   Continuous Glucose Receiver (FREESTYLE LIBRE 3 READER) DEVI Apply 1 each topically daily.     fexofenadine (ALLEGRA) 180 MG tablet Take 180 mg by mouth daily as needed for allergies or rhinitis.     furosemide  (LASIX ) 80 MG tablet TAKE 1 TABLET BY MOUTH 2 TIMES DAILY. 180 tablet 0   gabapentin  (NEURONTIN ) 600 MG tablet Take 1 tablet (600 mg total) by mouth 3 (three) times daily. 90 tablet 3   insulin  degludec (TRESIBA  FLEXTOUCH) 200 UNIT/ML FlexTouch Pen Inject 56 Units into the skin every evening. 27 mL 0   Insulin  Pen Needle 31G X 8 MM MISC Inject 1 Units into the skin daily.     mupirocin  ointment (BACTROBAN ) 2 % Apply 1 Application topically 2  (two) times daily. 30 g 2   MYRBETRIQ  25 MG TB24 tablet TAKE 1 TABLET (25 MG TOTAL) BY MOUTH DAILY. 90 tablet 1   nystatin cream (MYCOSTATIN) Apply 1 Application topically 2 (two) times daily.     omega-3 acid ethyl esters (LOVAZA ) 1 g capsule TAKE 2 CAPSULES BY MOUTH 2 TIMES DAILY. 360 capsule 0   omeprazole  (PRILOSEC) 40 MG capsule TAKE 1 CAPSULE (40 MG TOTAL) BY MOUTH IN THE MORNING AND AT BEDTIME. 180 capsule 1   ondansetron  (ZOFRAN ) 4 MG tablet Take 1 tablet (4 mg total) by mouth every 8 (eight) hours as needed for nausea or vomiting. 20 tablet 0   rivaroxaban (XARELTO) 20 MG TABS tablet Take by mouth at bedtime.     tirzepatide  (MOUNJARO ) 5 MG/0.5ML Pen Inject 5 mg into the skin once a week. 2 mL 0   VITAMIN D-VITAMIN K PO Take 1 tablet by mouth every evening.     No current facility-administered medications on file prior to visit.   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 Small internal hemorrhoids. Otherwise normal colonosopy.    LUMBAR WOUND DEBRIDEMENT N/A 08/09/2019   Procedure: LUMBAR WOUND DEBRIDEMENT Evacuation of  EPIDURAL HEMATOMA;  Surgeon: Mavis Purchase, MD;  Location: [facility] OR;  Service: Neurosurgery;  Laterality: N/A;  Family History  Problem Relation Age of Onset   Breast cancer Mother 77   Heart attack Father 56   Mental illness Sister    Heart Problems Brother 88   Cancer Brother    Breast cancer Paternal Grandmother    Colon cancer Paternal Aunt 64   Social History   Socioeconomic History   Marital status: Married    Spouse name: Alm   Number of children: Not on file   Years of education: Not on file   Highest education level: Not on file  Occupational History   Occupation: Retired Pensions consultant  Tobacco Use   Smoking status: Never    Passive exposure: Past   Smokeless tobacco: Never  Vaping Use   Vaping status: Never Used  Substance and Sexual Activity   Alcohol use: Not Currently   Drug use: Never   Sexual activity: Not Currently  Other Topics Concern   Not on file  Social History Narrative   Not on file   Social Drivers of Health   Financial Resource Strain: Low Risk  (02/02/2024)   Overall Financial Resource Strain (CARDIA)    Difficulty of Paying Living Expenses: Not hard at all  Food Insecurity: No Food Insecurity (02/02/2024)   Hunger Vital Sign    Worried About Running Out of Food in the Last Year: Never true    Ran Out of Food in the Last Year: Never true  Transportation Needs: No Transportation Needs (02/02/2024)   PRAPARE - Administrator, Civil Service (Medical): No    Lack of Transportation (Non-Medical): No  Physical Activity: Inactive (02/02/2024)   Exercise Vital Sign    Days of Exercise per Week: 0 days    Minutes of Exercise per Session: 0 min  Stress: No Stress Concern  Present (02/02/2024)   [employer] of Occupational Health - Occupational Stress Questionnaire    Feeling of Stress: Not at all  Social Connections: Moderately Integrated (02/02/2024)   Social Connection and Isolation Panel    Frequency of Communication with Friends and Family: Twice a week    Frequency of Social Gatherings with Friends and Family: Twice a week    Attends Religious Services: More than 4 times per year    Active Member of [employer] or Organizations: No    Attends Engineer, structural: Never    Marital Status: Married    Objective:  BP 136/70   Pulse 83   Temp 98.2 F (36.8 C)   Ht 5' 3 (1.6 m)   Wt 210 lb (95.3 kg)   SpO2 98%   BMI 37.20 kg/m      02/02/2024   11:33 AM 02/02/2024   11:16 AM 12/31/2023   10:33 AM  BP/Weight  Systolic BP 136 148 130  Diastolic BP 70 90 70  Wt. (Lbs)  210   BMI  37.2 kg/m2     Physical Exam  {Perform Simple Foot Exam  Perform Detailed exam:1} {Insert foot Exam (Optional):30965}   Lab Results  Component Value Date   WBC 9.3 11/05/2023   HGB 14.0 11/05/2023   HCT 43.0 11/05/2023   PLT 291 11/05/2023   GLUCOSE 106 (H) 11/17/2023   CHOL 235 (H) 12/31/2023   TRIG 207 (H) 12/31/2023   HDL 51 12/31/2023   LDLCALC 147 (H) 12/31/2023   ALT 13 11/17/2023   AST 15 11/17/2023   NA 142 11/17/2023   K 5.1 11/17/2023   CL 99 11/17/2023   CREATININE 0.91 11/17/2023  BUN 18 11/17/2023   CO2 28 11/17/2023   TSH 2.950 06/11/2023   INR 0.9 08/04/2019   HGBA1C 7.8 (H) 12/31/2023      Assessment & Plan:  Lumbar pain Assessment & Plan: Ordering Lumbar spine xray Check UA  Orders: -     DG Lumbar Spine Complete; Future -     POCT URINALYSIS DIP (CLINITEK)  Left hip pain Assessment & Plan: Order left hip unilateral xray.  Orders: -     DG HIP UNILAT W OR W/O PELVIS 2-3 VIEWS LEFT; Future  Cervicalgia Assessment & Plan: The current medical regimen is effective;  continue present plan and medications.    Orders: -     traMADol  HCl; Take 1 tablet (50 mg total) by mouth every 6 (six) hours as needed for severe pain (pain score 7-10) (lumbar pain/hip pain).  Dispense: 20 tablet; Refill: 0  Cervical radiculopathy Assessment & Plan: The current medical regimen is effective;  continue present plan and medications.  Gabapentin  600 mg TID.  Orders: -     traMADol  HCl; Take 1 tablet (50 mg total) by mouth every 6 (six) hours as needed for severe pain (pain score 7-10) (lumbar pain/hip pain).  Dispense: 20 tablet; Refill: 0     Meds ordered this encounter  Medications   traMADol  (ULTRAM ) 50 MG tablet    Sig: Take 1 tablet (50 mg total) by mouth every 6 (six) hours as needed for severe pain (pain score 7-10) (lumbar pain/hip pain).    Dispense:  20 tablet    Refill:  0    Orders Placed This Encounter  Procedures   DG Hip Unilat W OR W/O Pelvis 2-3 Views Left   DG Lumbar Spine Complete   POCT URINALYSIS DIP (CLINITEK)     Follow-up: Return in about 2 months (around 04/03/2024) for chronic fasting.   I,Katherina A Bramblett,acting as a scribe for Abigail Free, MD.,have documented all relevant documentation on the behalf of Abigail Free, MD,as directed by  Abigail Free, MD while in the presence of Abigail Free, MD.   LILLETTE Kato I Leal-Borjas,acting as a scribe for Abigail Free, MD.,have documented all relevant documentation on the behalf of Abigail Free, MD,as directed by  Abigail Free, MD while in the presence of Abigail Free, MD.    An After Visit Summary was printed and given to the patient.  Abigail Free, MD [facility] [phone]

## 2024-02-05 LAB — POCT URINALYSIS DIP (CLINITEK)
Bilirubin, UA: NEGATIVE
Blood, UA: NEGATIVE
Glucose, UA: NEGATIVE mg/dL
Ketones, POC UA: NEGATIVE mg/dL
Leukocytes, UA: NEGATIVE
Nitrite, UA: NEGATIVE
POC PROTEIN,UA: NEGATIVE
Spec Grav, UA: 1.015 (ref 1.010–1.025)
Urobilinogen, UA: 0.2 U/dL
pH, UA: 6.5 (ref 5.0–8.0)

## 2024-02-07 NOTE — Assessment & Plan Note (Signed)
 The current medical regimen is effective;  continue present plan and medications.

## 2024-02-07 NOTE — Assessment & Plan Note (Signed)
 The current medical regimen is effective;  continue present plan and medications.  Gabapentin  600 mg TID.

## 2024-02-07 NOTE — Assessment & Plan Note (Signed)
 Order left hip unilateral xray.

## 2024-02-07 NOTE — Assessment & Plan Note (Signed)
 Start on tramadol  Ordering Lumbar spine xray Check UA

## 2024-02-08 ENCOUNTER — Encounter: Payer: Self-pay | Admitting: Family Medicine

## 2024-02-08 DIAGNOSIS — S42025A Nondisplaced fracture of shaft of left clavicle, initial encounter for closed fracture: Secondary | ICD-10-CM | POA: Insufficient documentation

## 2024-02-08 NOTE — Assessment & Plan Note (Signed)
 Old and healed.

## 2024-02-09 ENCOUNTER — Ambulatory Visit (INDEPENDENT_AMBULATORY_CARE_PROVIDER_SITE_OTHER): Admitting: Podiatry

## 2024-02-09 DIAGNOSIS — M79675 Pain in left toe(s): Secondary | ICD-10-CM

## 2024-02-09 DIAGNOSIS — R234 Changes in skin texture: Secondary | ICD-10-CM

## 2024-02-09 DIAGNOSIS — B351 Tinea unguium: Secondary | ICD-10-CM | POA: Diagnosis not present

## 2024-02-09 DIAGNOSIS — E1140 Type 2 diabetes mellitus with diabetic neuropathy, unspecified: Secondary | ICD-10-CM

## 2024-02-09 DIAGNOSIS — Z794 Long term (current) use of insulin: Secondary | ICD-10-CM | POA: Diagnosis not present

## 2024-02-09 DIAGNOSIS — L84 Corns and callosities: Secondary | ICD-10-CM

## 2024-02-09 DIAGNOSIS — M79674 Pain in right toe(s): Secondary | ICD-10-CM

## 2024-02-09 NOTE — Telephone Encounter (Signed)
 Left message for patient to return call.

## 2024-02-09 NOTE — Progress Notes (Unsigned)
 Subjective:  Patient ID: Caitlin [name], female    DOB: November 01, 1945,  MRN: [id number]   Caitlin [name] presents to clinic today for:  Chief Complaint  Patient presents with   [facility] with a callous on the right bunion area, it looks split open. Last A1c was 7.8 in May, Xeralto  Patient is a 78 year old female who comes in due to painful callus underneath the right first metatarsal head region, painful callus right subfifth metatarsal head.  She also presents with painful, thickened, elongated dystrophic toenails which are tender with shoe gear.  She is diabetic, last A1c 7.8.  She does endorse numbness and tingling in her toes.  PCP is Cox, Abigail, MD.  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 Small internal hemorrhoids. Otherwise normal colonosopy.    LUMBAR WOUND  DEBRIDEMENT N/A 08/09/2019   Procedure: LUMBAR WOUND DEBRIDEMENT Evacuation of  EPIDURAL HEMATOMA;  Surgeon: Mavis Purchase, MD;  Location: [facility] OR;  Service: Neurosurgery;  Laterality: N/A;    Allergies  Allergen Reactions   Valsartan  Swelling   Ozempic  (0.25 Or 0.5 Mg-Dose) [Semaglutide (0.25 Or 0.5mg -Dos)]     Constipation.   Codeine Nausea Only    Review of Systems: Negative except as noted in the HPI.  Objective:  There were no vitals filed for this visit.  Caitlin [name] is a pleasant 78 y.o. female in NAD. AAO x 3.  Vascular Examination: Capillary refill time approximately 3 seconds to toes bilateral.  Palpable DP pulses, nonpalpable PT pulses b/l LE. Digital hair absent b/l.  Skin temperature gradient WNL b/l.  Varicosities present b/l. No cyanosis noted b/l.   Dermatological Examination: Fissure present right first metatarsal head plantar aspect with significant hyperkeratotic tissue.  There is also painful preulcerative callus present right subfifth metatarsal head.  Painful on palpation.  No full-thickness skin breakdown today.  Preulcerative in nature.  No significant erythema or edema to the right foot or left foot.  Nails x 10 are within normal limits for length, increased thickness and discoloration is noted.  Painful on direct dorsal palpation  Neurological Examination: Vibratory sensation diminished bilaterally.  Protective sensation diminished bilaterally.  Musculoskeletal Examination: Muscle strength 5/5 to all LE muscle groups b/l.  Significant bunion deformities present right  greater than left.  Right foot tailor's bunion present as well.     Latest Ref Rng & Units 12/31/2023   11:50 AM 09/24/2023   10:09 AM 06/11/2023    9:11 AM 02/24/2023    8:21 AM  Hemoglobin A1C  Hemoglobin-A1c 4.8 - 5.6 % 7.8  7.3  8.5  8.5      Assessment/Plan: 1. Type 2 diabetes mellitus with diabetic neuropathy, with long-term current use of insulin  (HCC)   2. Fissure in skin of  right foot   3. Pain due to onychomycosis of toenails of both feet   4. Pre-ulcerative calluses     No orders of the defined types were placed in this encounter.  None  Plan: # Preulcerative calluses x 2 - Preulcerative fissure right subfirst metatarsal head and preulcerative callus right subfifth metatarsal head were sharply debrided using 312 scalpel blade.  No full-thickness skin breakdown today.  Mupirocin  and bandage applied.  Rx sent to patient's pharmacy for mupirocin  to be applied to the fissured skin, AmLactin prescribed to help manage and soften the callus. -Discussed strategies for home management and offloading such as skin moisturization, white vinegar scrubs, felt or foam offloading pads as dispensed, shoe gear modifications, custom inserts or shoes.  # Onychomycosis of toenails with pain -Nails x 10 were debrided in thickness and length using sterile nail nippers without incident.  Mechanical bur used to file down the nails - Patient educated on diabetes. Discussed proper diabetic foot care and discussed risks and complications of disease. Educated patient in depth on reasons to return to the office immediately should he/she discover anything concerning or new on the feet. All questions answered. Discussed proper shoes as well.   High risk footcare due to diminished PT pulses, neuropathy symptoms and trophic changes.    Return in about 9 weeks (around 04/12/2024) for Diabetic Foot Care.   Ethan Saddler, DPM, AACFAS [facility]     [address] [place], [place] [address]                Office [phone]  Fax [phone]

## 2024-02-10 NOTE — Telephone Encounter (Signed)
 Left message for patient to return call.

## 2024-02-11 ENCOUNTER — Ambulatory Visit: Admitting: Family Medicine

## 2024-02-17 ENCOUNTER — Ambulatory Visit (INDEPENDENT_AMBULATORY_CARE_PROVIDER_SITE_OTHER)

## 2024-02-17 DIAGNOSIS — M2141 Flat foot [pes planus] (acquired), right foot: Secondary | ICD-10-CM | POA: Diagnosis not present

## 2024-02-17 DIAGNOSIS — L84 Corns and callosities: Secondary | ICD-10-CM | POA: Diagnosis not present

## 2024-02-17 DIAGNOSIS — Z794 Long term (current) use of insulin: Secondary | ICD-10-CM

## 2024-02-17 DIAGNOSIS — M2142 Flat foot [pes planus] (acquired), left foot: Secondary | ICD-10-CM

## 2024-02-17 DIAGNOSIS — I739 Peripheral vascular disease, unspecified: Secondary | ICD-10-CM

## 2024-02-17 DIAGNOSIS — E1140 Type 2 diabetes mellitus with diabetic neuropathy, unspecified: Secondary | ICD-10-CM | POA: Diagnosis not present

## 2024-02-17 NOTE — Progress Notes (Signed)

## 2024-02-19 ENCOUNTER — Other Ambulatory Visit

## 2024-03-24 ENCOUNTER — Other Ambulatory Visit: Payer: Self-pay | Admitting: Family Medicine

## 2024-03-24 DIAGNOSIS — E1142 Type 2 diabetes mellitus with diabetic polyneuropathy: Secondary | ICD-10-CM

## 2024-03-24 MED ORDER — GABAPENTIN 600 MG PO TABS
600.0000 mg | ORAL_TABLET | Freq: Three times a day (TID) | ORAL | 3 refills | Status: AC
Start: 2024-03-24 — End: ?

## 2024-03-24 NOTE — Telephone Encounter (Signed)
 Copied from CRM #[id number]. Topic: Clinical - Medication Refill >> Mar 24, 2024 12:57 PM Elle L wrote: Medication: gabapentin  (NEURONTIN ) 600 MG tablet  Has the patient contacted their pharmacy? Yes  This is the patient's preferred pharmacy:  [id number] - [place], [place] - [address] [place] [address] Phone: [phone] Fax: [phone]  Is this the correct pharmacy for this prescription? Yes  Has the prescription been filled recently? Yes  Is the patient out of the medication? Yes  Has the patient been seen for an appointment in the last year OR does the patient have an upcoming appointment? Yes  Can we respond through MyChart? No  Agent: Please be advised that Rx refills may take up to 3 business days. We ask that you follow-up with your pharmacy.

## 2024-04-05 ENCOUNTER — Ambulatory Visit: Admitting: Family Medicine

## 2024-04-12 ENCOUNTER — Encounter: Payer: Self-pay | Admitting: Podiatry

## 2024-04-12 ENCOUNTER — Ambulatory Visit (INDEPENDENT_AMBULATORY_CARE_PROVIDER_SITE_OTHER): Admitting: Podiatry

## 2024-04-12 DIAGNOSIS — M79675 Pain in left toe(s): Secondary | ICD-10-CM

## 2024-04-12 DIAGNOSIS — E1140 Type 2 diabetes mellitus with diabetic neuropathy, unspecified: Secondary | ICD-10-CM

## 2024-04-12 DIAGNOSIS — B351 Tinea unguium: Secondary | ICD-10-CM | POA: Diagnosis not present

## 2024-04-12 DIAGNOSIS — Z794 Long term (current) use of insulin: Secondary | ICD-10-CM

## 2024-04-12 DIAGNOSIS — I739 Peripheral vascular disease, unspecified: Secondary | ICD-10-CM

## 2024-04-12 DIAGNOSIS — M79674 Pain in right toe(s): Secondary | ICD-10-CM | POA: Diagnosis not present

## 2024-04-12 DIAGNOSIS — L84 Corns and callosities: Secondary | ICD-10-CM

## 2024-04-12 NOTE — Progress Notes (Signed)
 Subjective:  Patient ID: Caitlin [name], female    DOB: Jan 08, 1946,  MRN: [id number]   Caitlin [name] presents to clinic today for:  Chief Complaint  Patient presents with   [facility]    [facility] with callous A1c 7.2 in July No anti coag.   Patient is a 78 year old female who comes in due to painful callus underneath the right first metatarsal head region, painful callus right subfifth metatarsal head.  She also presents with painful, thickened, elongated dystrophic toenails which are tender with shoe gear.  She is diabetic, last A1c 7.2.  She does endorse numbness and tingling in her toes.  PCP is Cox, Abigail, MD.  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 Small internal hemorrhoids. Otherwise normal colonosopy.    LUMBAR WOUND DEBRIDEMENT N/A 08/09/2019   Procedure: LUMBAR  WOUND DEBRIDEMENT Evacuation of  EPIDURAL HEMATOMA;  Surgeon: Mavis Purchase, MD;  Location: [facility] OR;  Service: Neurosurgery;  Laterality: N/A;    Allergies  Allergen Reactions   Valsartan  Swelling   Ozempic  (0.25 Or 0.5 Mg-Dose) [Semaglutide (0.25 Or 0.5mg -Dos)]     Constipation.   Codeine Nausea Only    Review of Systems: Negative except as noted in the HPI.  Objective:  There were no vitals filed for this visit.  Caitlin [name] is a pleasant 78 y.o. female in NAD. AAO x 3.  Vascular Examination: Capillary refill time approximately 3 seconds to toes bilateral.  Palpable DP pulses, nonpalpable PT pulses b/l LE. Digital hair absent b/l.  Skin temperature gradient WNL b/l.  Varicosities present b/l. No cyanosis noted b/l.   Dermatological Examination: Fissure present right first metatarsal head plantar aspect with significant hyperkeratotic tissue.  There is also painful preulcerative callus present right subfifth metatarsal head.  Painful on palpation.  No full-thickness skin breakdown today.  Preulcerative in nature.  No significant erythema or edema to the right foot or left foot.  Nails x 10 are within normal limits for length, increased thickness and discoloration is noted.  Painful on direct dorsal palpation  Neurological Examination: Vibratory sensation diminished bilaterally.  Protective sensation diminished bilaterally.  Musculoskeletal Examination: Muscle strength 5/5 to all LE muscle groups b/l.  Significant bunion deformities present right greater than left.  Right foot tailor's bunion present  as well.     Latest Ref Rng & Units 12/31/2023   11:50 AM 09/24/2023   10:09 AM 06/11/2023    9:11 AM  Hemoglobin A1C  Hemoglobin-A1c 4.8 - 5.6 % 7.8  7.3  8.5      Assessment/Plan: 1. Type 2 diabetes mellitus with diabetic neuropathy, with long-term current use of insulin  (HCC)   2. Pre-ulcerative calluses   3. PAD (peripheral artery disease) (HCC)   4. Pain due to  onychomycosis of toenails of both feet     No orders of the defined types were placed in this encounter.  None  Plan: # Preulcerative calluses x 2 - Preulcerative fissure right subfirst metatarsal head and preulcerative callus right subfifth metatarsal head were sharply debrided using 312 scalpel blade.  No full-thickness skin breakdown today.  Continue AmLactin prescribed to help manage and soften the callus. -Discussed strategies for home management and offloading such as skin moisturization, white vinegar scrubs, felt or foam offloading pads as dispensed, shoe gear modifications, custom inserts or shoes.  # Onychomycosis of toenails with pain -Nails x 10 were debrided in thickness and length using sterile nail nippers without incident.  Mechanical bur used to file down the nails - Patient educated on diabetes. Discussed proper diabetic foot care and discussed risks and complications of disease. Educated patient in depth on reasons to return to the office immediately should he/she discover anything concerning or new on the feet. All questions answered. Discussed proper shoes as well.   High risk footcare due to diminished PT pulses, neuropathy symptoms and trophic changes.    Return in about 9 weeks (around 06/14/2024) for Diabetic Foot Care.   Ethan Saddler, DPM, AACFAS [facility]     [address] [place], [place] [address]                Office [phone]  Fax [phone]

## 2024-04-25 ENCOUNTER — Other Ambulatory Visit: Payer: Self-pay | Admitting: Family Medicine

## 2024-04-27 LAB — HM MAMMOGRAPHY

## 2024-04-29 LAB — OPHTHALMOLOGY REPORT-SCANNED

## 2024-04-30 ENCOUNTER — Encounter: Payer: Self-pay | Admitting: Family Medicine

## 2024-05-02 ENCOUNTER — Ambulatory Visit: Payer: Self-pay | Admitting: Family Medicine

## 2024-05-19 ENCOUNTER — Other Ambulatory Visit: Payer: Self-pay | Admitting: Family Medicine

## 2024-05-19 DIAGNOSIS — I152 Hypertension secondary to endocrine disorders: Secondary | ICD-10-CM

## 2024-06-05 ENCOUNTER — Other Ambulatory Visit: Payer: Self-pay | Admitting: Family Medicine

## 2024-06-05 DIAGNOSIS — K219 Gastro-esophageal reflux disease without esophagitis: Secondary | ICD-10-CM

## 2024-06-11 DIAGNOSIS — M4306 Spondylolysis, lumbar region: Secondary | ICD-10-CM | POA: Insufficient documentation

## 2024-06-11 DIAGNOSIS — E113599 Type 2 diabetes mellitus with proliferative diabetic retinopathy without macular edema, unspecified eye: Secondary | ICD-10-CM | POA: Insufficient documentation

## 2024-06-16 ENCOUNTER — Ambulatory Visit: Payer: Self-pay | Admitting: *Deleted

## 2024-06-16 NOTE — Telephone Encounter (Signed)
 FYI Only or Action Required?: FYI only for provider: appointment scheduled on 10/30.  Patient was last seen in primary care on 02/02/2024 by Sherre Clapper, MD.  Called Nurse Triage reporting Shortness of Breath.  Symptoms began several weeks ago.  Interventions attempted: Prescription medications: Nebulizer.  Symptoms are: gradually improving.  Triage Disposition: See HCP Within 4 Hours (Or PCP Triage)  Patient/caregiver understands and will follow disposition?: yes   Follow up call- patient has used her nebulizer and she is up to 93%- she feels she is breathing better. Reviewed proper use of O2 sat monitor and precautions given for ED- and low readings. Patient will continue to monitor today and she is going to call for RF of her inhalers.   Here are some important tips on correctly using a pulse oximeter. * Use the index or middle finger. Try not to use the toes or ear lobes. * Remove nail polish from the finger on which pulse oximetry is being performed. * Warm the hand prior to measurement. * Perform the pulse oximetry indoors. Avoid bright light. * Perform the pulse oximetry while at rest, and during quiet breathing. * Observe readings for 30 to 60 seconds. Identify the most common value. Only use readings that have a strong and regular pulse signal. * Measure and record values two to three times per day

## 2024-06-16 NOTE — Progress Notes (Signed)
 Acute Office Visit  Subjective:    Patient ID: Caitlin [name], female    DOB: Jul 09, 1946, 78 y.o.   MRN: [id number]  Chief Complaint  Patient presents with   COPD    Wants to discuss restarting breztri    Hypertension   Discussed the use of AI scribe software for clinical note transcription with the patient, who gave verbal consent to proceed.  History of Present Illness Caitlin [name] is a 78 year old female with COPD who presents with shortness of breath and constipation.  Dyspnea and chest tightness - Shortness of breath and chest tightness for the past couple of weeks - Oxygen  saturation dropped to 83% yesterday, improved to 93% after nebulizer use - Not using Breztri  or albuterol  inhalers for several months - Shortness of breath with exertion - No fever, chills, sweats, sore throat, or cough - Stuffy nose present  Constipation and abdominal symptoms - Constipation for the past six months - No bowel movement for over a week and a half - Lack of sensation to defecate - Worsening abdominal pain - New onset indigestion - Tried stool softeners, Dulcolax, and Miralax without relief - Bowel movements, when she has one, are watery and unsatisfying - Discontinued Mounjaro  about a month and a half ago, suspecting it contributed to constipation  Urinary symptoms - Underwent Botox procedure on bladder about a month ago with some relief but not significant improvement - No dysuria or symptoms of urinary tract infection  Diabetes mellitus - History of diabetes mellitus - Last hemoglobin A1c was 12/31/2023 and was 7.6 - On tresiba  56 U daily  - Regular blood glucose monitoring - Currently off Mounjaro  for about a month and a half  Hypertension and hyperlipidemia - Hypertension managed by carvedilol  6.25 mg twice daily, irbesartan 150 mg daily, furosemide  80 mg 1/2 pill daily as needed ,  - Hyperlipidemia treatment - not sure patient taking lipitor as her 90 day prescription should  have been exhausted by now if she was compliant. .   Atrial fibrillation - on xarelto 20 mg daily and carvedilol  6.25 mg twice daily.     6 minute walk test O2 92 walked 3 minutes O2 dropped to 89 then went to 95 after sitting for 15 secs.  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 Small internal hemorrhoids. Otherwise normal colonosopy.    LUMBAR WOUND DEBRIDEMENT N/A 08/09/2019   Procedure: LUMBAR WOUND DEBRIDEMENT Evacuation of  EPIDURAL HEMATOMA;  Surgeon: Mavis Purchase, MD;  Location: [facility] OR;  Service: Neurosurgery;  Laterality: N/A;    Family History  Problem Relation Age of Onset   Breast cancer Mother 5   Heart attack Father 28   Mental illness Sister    Heart Problems Brother 40   Cancer Brother    Breast cancer Paternal Grandmother    Colon cancer  Paternal Aunt 101    Social History   Socioeconomic History   Marital status: Married    Spouse name: Alm   Number of children: Not on file   Years of education: Not on file   Highest education level: Not on file  Occupational History   Occupation: Retired Pensions Consultant  Tobacco Use   Smoking status: Never    Passive exposure: Past   Smokeless tobacco: Never  Vaping Use   Vaping status: Never Used  Substance and Sexual Activity   Alcohol use: Not Currently   Drug use: Never   Sexual activity: Not Currently  Other Topics Concern   Not on file  Social History Narrative   Not on file   Social Drivers of Health   Financial Resource Strain: Low Risk  (02/02/2024)   Overall Financial Resource Strain (CARDIA)    Difficulty of Paying Living Expenses: Not hard at all  Food Insecurity: No Food Insecurity (02/02/2024)   Hunger Vital Sign    Worried About Running Out of Food in the Last Year: Never true    Ran Out of Food in the Last Year: Never true  Transportation Needs: No Transportation Needs (02/02/2024)   PRAPARE - Administrator, Civil Service (Medical): No    Lack of Transportation (Non-Medical): No  Physical Activity: Inactive (02/02/2024)   Exercise Vital Sign    Days of Exercise per Week: 0 days    Minutes of Exercise per Session: 0 min  Stress: No Stress Concern Present (02/02/2024)   [employer] of Occupational Health - Occupational Stress Questionnaire    Feeling of Stress: Not at all  Social Connections: Moderately Integrated (02/02/2024)   Social Connection and Isolation Panel    Frequency of Communication with Friends and Family: Twice a week    Frequency of Social Gatherings with Friends and Family: Twice a week    Attends Religious Services: More than 4 times per year    Active Member of [employer] or Organizations: No    Attends Banker Meetings: Never    Marital Status: Married  Catering Manager Violence: Not At Risk (02/02/2024)    Humiliation, Afraid, Rape, and Kick questionnaire    Fear of Current or Ex-Partner: No    Emotionally Abused: No    Physically Abused: No    Sexually Abused: No    Outpatient Medications Prior to Visit  Medication Sig Dispense Refill   escitalopram (LEXAPRO) 10 MG tablet Take 10 mg by mouth daily.     irbesartan (AVAPRO) 150 MG tablet Take 150 mg by mouth daily.     albuterol  (PROVENTIL ) (2.5 MG/3ML) 0.083% nebulizer solution Take 3 mLs (2.5 mg total) by nebulization every 6 (six) hours as needed for wheezing or shortness of breath. 75 mL 0   albuterol  (VENTOLIN  HFA) 108 (90 Base) MCG/ACT inhaler Inhale 2 puffs into the lungs every 6 (six) hours as needed for wheezing or  shortness of breath. 8 g 3   ammonium lactate  (AMLACTIN) 12 % cream Apply 1 Application topically as needed for dry skin. 385 g 0   atorvastatin  (LIPITOR) 10 MG tablet TAKE 1 TABLET BY MOUTH EVERY DAY 90 tablet 0   BD PEN NEEDLE NANO 2ND GEN 32G X 4 MM MISC 1 EACH BY DOES NOT APPLY ROUTE IN THE MORNING AND AT BEDTIME. 100 each 2   Budeson-Glycopyrrol-Formoterol  (BREZTRI  AEROSPHERE) 160-9-4.8 MCG/ACT AERO Inhale 2 puffs into the lungs in the morning and at bedtime. 10.7 g 3   buPROPion  (WELLBUTRIN  XL) 300 MG 24 hr tablet TAKE 1 TABLET BY MOUTH EVERY DAY IN THE MORNING 90 tablet 1   calcium  carbonate (TUMS - DOSED IN MG ELEMENTAL CALCIUM ) 500 MG chewable tablet Chew 2 tablets by mouth daily as needed for indigestion.      carvedilol  (COREG ) 6.25 MG tablet TAKE 1 TABLET BY MOUTH TWICE A DAY 180 tablet 1   Continuous Glucose Receiver (FREESTYLE LIBRE 3 READER) DEVI Apply 1 each topically daily.     fexofenadine (ALLEGRA) 180 MG tablet Take 180 mg by mouth daily as needed for allergies or rhinitis.     furosemide  (LASIX ) 80 MG tablet TAKE 1 TABLET BY MOUTH 2 TIMES DAILY. 180 tablet 0   gabapentin  (NEURONTIN ) 600 MG tablet Take 1 tablet (600 mg total) by mouth 3 (three) times daily. 90 tablet 3   insulin  degludec (TRESIBA   FLEXTOUCH) 200 UNIT/ML FlexTouch Pen Inject 56 Units into the skin every evening. 27 mL 0   Insulin  Pen Needle 31G X 8 MM MISC Inject 1 Units into the skin daily.     mupirocin  ointment (BACTROBAN ) 2 % Apply 1 Application topically 2 (two) times daily. 30 g 2   MYRBETRIQ  25 MG TB24 tablet TAKE 1 TABLET (25 MG TOTAL) BY MOUTH DAILY. 90 tablet 1   nystatin cream (MYCOSTATIN) Apply 1 Application topically 2 (two) times daily.     omega-3 acid ethyl esters (LOVAZA ) 1 g capsule TAKE 2 CAPSULES BY MOUTH 2 TIMES DAILY. 360 capsule 0   omeprazole  (PRILOSEC) 40 MG capsule TAKE 1 CAPSULE (40 MG TOTAL) BY MOUTH IN THE MORNING AND AT BEDTIME. 180 capsule 1   ondansetron  (ZOFRAN ) 4 MG tablet Take 1 tablet (4 mg total) by mouth every 8 (eight) hours as needed for nausea or vomiting. 20 tablet 0   rivaroxaban (XARELTO) 20 MG TABS tablet Take by mouth at bedtime.     traMADol  (ULTRAM ) 50 MG tablet Take 1 tablet (50 mg total) by mouth every 6 (six) hours as needed for severe pain (pain score 7-10) (lumbar pain/hip pain). 20 tablet 0   VITAMIN D-VITAMIN K PO Take 1 tablet by mouth every evening.     tirzepatide  (MOUNJARO ) 5 MG/0.5ML Pen Inject 5 mg into the skin once a week. 2 mL 0   No facility-administered medications prior to visit.    Allergies  Allergen Reactions   Valsartan  Swelling   Mounjaro  [Tirzepatide ] Other (See Comments)    constipation   Ozempic  (0.25 Or 0.5 Mg-Dose) [Semaglutide (0.25 Or 0.5mg -Dos)]     Constipation.   Codeine Nausea Only   Review of Systems  Constitutional:  Negative for chills, fever and malaise/fatigue.  HENT:  Positive for congestion. Negative for ear pain, sinus pain and sore throat.   Respiratory:  Positive for shortness of breath. Negative for cough.   Cardiovascular:  Positive for chest pain.  Gastrointestinal:  Positive for abdominal pain and constipation. Negative for  diarrhea, heartburn, nausea and vomiting.       Bloating  Musculoskeletal:  Negative for  myalgias.  Neurological:  Negative for headaches.        Objective:        06/17/2024    9:38 AM 02/02/2024   11:33 AM 02/02/2024   11:16 AM  Vitals with BMI  Height 5' 3  5' 3  Weight 194 lbs  210 lbs  BMI 34.37  37.21  Systolic 154 136 851  Diastolic 92 70 90  Pulse 83  83    No data found.   Physical Exam Vitals reviewed.  Constitutional:      Appearance: Normal appearance. She is obese.  HENT:     Right Ear: Tympanic membrane normal.     Left Ear: Tympanic membrane normal.     Nose: Congestion present.     Mouth/Throat:     Pharynx: No oropharyngeal exudate or posterior oropharyngeal erythema.  Neck:     Vascular: No carotid bruit.  Cardiovascular:     Rate and Rhythm: Normal rate and regular rhythm.     Pulses: Normal pulses.     Heart sounds: Normal heart sounds.  Pulmonary:     Effort: Pulmonary effort is normal. No respiratory distress.     Comments: Decreased air exchange.  Abdominal:     General: Bowel sounds are normal. There is distension.     Palpations: Abdomen is soft.     Tenderness: There is abdominal tenderness (diffuse).  Neurological:     Mental Status: She is alert and oriented to person, place, and time.  Psychiatric:        Mood and Affect: Mood normal.        Behavior: Behavior normal.    Diabetic Foot Exam - Simple   Simple Foot Form Diabetic Foot exam was performed with the following findings: Yes 06/16/2024  3:41 PM  Visual Inspection See comments: Yes Sensation Testing Intact to touch and monofilament testing bilaterally: Yes Pulse Check Posterior Tibialis and Dorsalis pulse intact bilaterally: Yes Comments Thickened nails.      Health Maintenance Due  Topic Date Due   OPHTHALMOLOGY EXAM  12/08/2021   Zoster Vaccines- Shingrix (2 of 2) 05/27/2023   COVID-19 Vaccine (4 - 2025-26 season) 04/19/2024    There are no preventive care reminders to display for this patient.   Lab Results  Component Value Date   TSH  1.550 06/17/2024   Lab Results  Component Value Date   WBC 7.9 06/17/2024   HGB 13.6 06/17/2024   HCT 42.1 06/17/2024   MCV 86 06/17/2024   PLT 311 06/17/2024   Lab Results  Component Value Date   NA 142 06/17/2024   K 5.5 (H) 06/17/2024   CO2 24 06/17/2024   GLUCOSE 106 (H) 06/17/2024   BUN 13 06/17/2024   CREATININE 0.87 06/17/2024   BILITOT 0.3 06/17/2024   ALKPHOS 91 06/17/2024   AST 15 06/17/2024   ALT 11 06/17/2024   PROT 6.9 06/17/2024   ALBUMIN  4.4 06/17/2024   CALCIUM  9.8 06/17/2024   ANIONGAP 14 08/10/2019   EGFR 69 06/17/2024   Lab Results  Component Value Date   CHOL 242 (H) 06/17/2024   Lab Results  Component Value Date   HDL 46 06/17/2024   Lab Results  Component Value Date   LDLCALC 169 (H) 06/17/2024   Lab Results  Component Value Date   TRIG 148 06/17/2024   Lab Results  Component Value Date  CHOLHDL 5.3 (H) 06/17/2024   Lab Results  Component Value Date   HGBA1C 6.5 (H) 06/17/2024    Results for orders placed or performed in visit on 06/17/24  CBC with Differential/Platelet   Collection Time: 06/17/24 12:15 PM  Result Value Ref Range   WBC 7.9 3.4 - 10.8 x10E3/uL   RBC 4.88 3.77 - 5.28 x10E6/uL   Hemoglobin 13.6 11.1 - 15.9 g/dL   Hematocrit 57.8 65.9 - 46.6 %   MCV 86 79 - 97 fL   MCH 27.9 26.6 - 33.0 pg   MCHC 32.3 31.5 - 35.7 g/dL   RDW 84.0 (H) 88.2 - 84.5 %   Platelets 311 150 - 450 x10E3/uL   Neutrophils 59 Not Estab. %   Lymphs 28 Not Estab. %   Monocytes 10 Not Estab. %   Eos 2 Not Estab. %   Basos 1 Not Estab. %   Neutrophils Absolute 4.6 1.4 - 7.0 x10E3/uL   Lymphocytes Absolute 2.3 0.7 - 3.1 x10E3/uL   Monocytes Absolute 0.8 0.1 - 0.9 x10E3/uL   EOS (ABSOLUTE) 0.2 0.0 - 0.4 x10E3/uL   Basophils Absolute 0.1 0.0 - 0.2 x10E3/uL   Immature Granulocytes 0 Not Estab. %   Immature Grans (Abs) 0.0 0.0 - 0.1 x10E3/uL  Comprehensive metabolic panel with GFR   Collection Time: 06/17/24 12:15 PM  Result Value Ref Range    Glucose 106 (H) 70 - 99 mg/dL   BUN 13 8 - 27 mg/dL   Creatinine, Ser 9.12 0.57 - 1.00 mg/dL   eGFR 69 >40 fO/fpw/8.26   BUN/Creatinine Ratio 15 12 - 28   Sodium 142 134 - 144 mmol/L   Potassium 5.5 (H) 3.5 - 5.2 mmol/L   Chloride 101 96 - 106 mmol/L   CO2 24 20 - 29 mmol/L   Calcium  9.8 8.7 - 10.3 mg/dL   Total Protein 6.9 6.0 - 8.5 g/dL   Albumin  4.4 3.8 - 4.8 g/dL   Globulin, Total 2.5 1.5 - 4.5 g/dL   Bilirubin Total 0.3 0.0 - 1.2 mg/dL   Alkaline Phosphatase 91 49 - 135 IU/L   AST 15 0 - 40 IU/L   ALT 11 0 - 32 IU/L  Hemoglobin A1c   Collection Time: 06/17/24 12:15 PM  Result Value Ref Range   Hgb A1c MFr Bld 6.5 (H) 4.8 - 5.6 %   Est. average glucose Bld gHb Est-mCnc 140 mg/dL  Lipid panel   Collection Time: 06/17/24 12:15 PM  Result Value Ref Range   Cholesterol, Total 242 (H) 100 - 199 mg/dL   Triglycerides 851 0 - 149 mg/dL   HDL 46 >60 mg/dL   VLDL Cholesterol Cal 27 5 - 40 mg/dL   LDL Chol Calc (NIH) 830 (H) 0 - 99 mg/dL   Chol/HDL Ratio 5.3 (H) 0.0 - 4.4 ratio  TSH   Collection Time: 06/17/24 12:15 PM  Result Value Ref Range   TSH 1.550 0.450 - 4.500 uIU/mL       Assessment & Plan:   Assessment & Plan Simple chronic bronchitis (HCC) COPD decompensated due to cessation of Breztri  and albuterol , causing decreased oxygen  saturation and chest tightness. Oxygen  saturation improved post-nebulizer. - Restart breztri  2 puffs twice daily.  - Perform oxygen  walk test. Normal.  - Peak flows 250,300, 250. - Provide sample of Breztri  inhaler. Orders:   Peak flow   6 minute walk; Future  Shortness of breath Order chest xray. Copd noncompensated. Restart breztri  2 puffs twice daily.  Orders:   DG Chest 2 View; Future  Slow transit constipation Chronic constipation with abdominal pain and incomplete evacuation sensation. No bowel movements for over a week and a half. Concerns about potential underlying pathology. - Order abdominal x-ray. - Provide sample  of medication for constipation.  Start on Linzess 145 mg daily.  - Schedule monthly follow-up until symptoms improve. Orders:   DG Abd 2 Views; Future   TSH  Lower abdominal pain Chronic constipation with abdominal pain and incomplete evacuation sensation. No bowel movements for over a week and a half. - Order abdominal x-ray. - Provide sample of medication for constipation. Start on Linzess 145 mg daily.  - Schedule monthly follow-up until symptoms improve. Orders:   DG Abd 2 Views; Future  Diabetic peripheral neuropathy associated with type 2 diabetes mellitus (HCC) - At goal . - Recent cessation of Mounjaro  due to constipation concerns.  - Ongoing blood sugar monitoring. - Schedule follow-up appointment for diabetes management. - Continue tresiba  56 U daily  - I discussed importance of quarterly visits.  - todays visit was acute, so will have patient return for chronic visit.   Orders:   Hemoglobin A1c  Essential hypertension, benign Elevated blood pressure noted. She reports adherence to medications. Not at goal. Await labs.  Plan to increase irbesartan to 300 mg daily.   Orders:   CBC with Differential/Platelet   Comprehensive metabolic panel with GFR  Mixed hyperlipidemia Hyperlipidemia with high cholesterol levels noted in May.  Not sure patient taking lipitor as her 90 day prescription should have been exhausted by now if she was compliant.  - Schedule follow-up appointment for lipid management with Dr. Sirivol Orders:   Lipid panel  Paroxysmal atrial fibrillation (HCC) The current medical regimen is effective;  continue present plan and medications. Management per specialist. Appointment tomorrow.  Continue coreg  and xarelto.       I had a discussion with patient that she needs to be coming monthly until we get her feeling better. I discussed having Dr. Sirivol help take care of her (and possibly taking over) due to my limitations of available appointments and  her need to be seen much more frequently.   Meds ordered this encounter  Medications   linaclotide (LINZESS) 145 MCG CAPS capsule    Sig: Take 1 capsule (145 mcg total) by mouth daily before breakfast.    Orders Placed This Encounter  Procedures   DG Abd 2 Views   DG Chest 2 View   CBC with Differential/Platelet   Comprehensive metabolic panel with GFR   Hemoglobin A1c   Lipid panel   TSH   Peak flow   6 minute walk     I,Marla I Leal-Borjas,acting as a scribe for Abigail Free, MD.,have documented all relevant documentation on the behalf of Abigail Free, MD,as directed by  Abigail Free, MD while in the presence of Abigail Free, MD.   Follow-up: Return in about 2 weeks (around 07/01/2024) for chronic follow up, Dr. Sirivol.  An After Visit Summary was printed and given to the patient.  I attest that I have reviewed this visit and agree with the plan scribed by my staff.   Abigail Free, MD [facility] [phone]

## 2024-06-16 NOTE — Telephone Encounter (Signed)
 Copied from CRM [id number]. Topic: Clinical - Red Word Triage >> Jun 16, 2024 11:05 AM Antwanette L wrote: Red Word that prompted transfer to Nurse Triage: The patient is experiencing breathing difficulties and reports feeling unable to get enough air. Reason for Disposition . [1] Longstanding difficulty breathing (e.g., CHF, COPD, emphysema) AND [2] WORSE than normal  Answer Assessment - Initial Assessment Questions 1. RESPIRATORY STATUS: Describe your breathing? (e.g., wheezing, shortness of breath, unable to speak, severe coughing)      Stuffy, patient had stopped Breztri - thinks she may need to restart 2. ONSET: When did this breathing problem begin?      2 weeks 3. PATTERN Does the difficult breathing come and go, or has it been constant since it started?      Notices when moving and laying down 4. SEVERITY: How bad is your breathing? (e.g., mild, moderate, severe)      Mild/moderate- patient does not exhibit SOB with speaking 5. RECURRENT SYMPTOM: Have you had difficulty breathing before? If Yes, ask: When was the last time? and What happened that time?      Yes- was given Breztri  6. CARDIAC HISTORY: Do you have any history of heart disease? (e.g., heart attack, angina, bypass surgery, angioplasty)      Afib 7. LUNG HISTORY: Do you have any history of lung disease?  (e.g., pulmonary embolus, asthma, emphysema)     COPD, asthma 8. CAUSE: What do you think is causing the breathing problem?      COPD changes 9. OTHER SYMPTOMS: Do you have any other symptoms? (e.g., chest pain, cough, dizziness, fever, runny nose)     no 10. O2 SATURATION MONITOR:  Do you use an oxygen  saturation monitor (pulse oximeter) at home? If Yes, ask: What is your reading (oxygen  level) today? What is your usual oxygen  saturation reading? (e.g., 95%)      Baseline reported at above 90%, 87%- 90%, PR 67-patient has not been using her nebulizer or albuterol  inhaler. Patient advised her O2  sat is too low- verified patient does have nebulizer solution and she will do treatment- she is to call back to report her O2 sat after use.  Call to office -information relayed to Izetta- she is aware of plan- if patient is not able to bring O2 sat up above 90%- she will be directed to ED.  Protocols used: Breathing Difficulty-A-AH

## 2024-06-17 ENCOUNTER — Ambulatory Visit (INDEPENDENT_AMBULATORY_CARE_PROVIDER_SITE_OTHER)
Admission: RE | Admit: 2024-06-17 | Discharge: 2024-06-17 | Disposition: A | Discharge status: Home / Self Care | Source: Ambulatory Visit | Attending: Family Medicine | Admitting: Family Medicine

## 2024-06-17 ENCOUNTER — Ambulatory Visit: Admitting: Family Medicine

## 2024-06-17 VITALS — BP 154/92 | HR 83 | Temp 98.1°F | Ht 63.0 in | Wt 194.0 lb

## 2024-06-17 DIAGNOSIS — E782 Mixed hyperlipidemia: Secondary | ICD-10-CM

## 2024-06-17 DIAGNOSIS — R0600 Dyspnea, unspecified: Secondary | ICD-10-CM | POA: Diagnosis not present

## 2024-06-17 DIAGNOSIS — E1142 Type 2 diabetes mellitus with diabetic polyneuropathy: Secondary | ICD-10-CM

## 2024-06-17 DIAGNOSIS — R1030 Lower abdominal pain, unspecified: Secondary | ICD-10-CM

## 2024-06-17 DIAGNOSIS — K5901 Slow transit constipation: Secondary | ICD-10-CM

## 2024-06-17 DIAGNOSIS — R0602 Shortness of breath: Secondary | ICD-10-CM

## 2024-06-17 DIAGNOSIS — I10 Essential (primary) hypertension: Secondary | ICD-10-CM

## 2024-06-17 DIAGNOSIS — J410 Simple chronic bronchitis: Secondary | ICD-10-CM | POA: Diagnosis not present

## 2024-06-17 DIAGNOSIS — E1159 Type 2 diabetes mellitus with other circulatory complications: Secondary | ICD-10-CM

## 2024-06-17 DIAGNOSIS — R42 Dizziness and giddiness: Secondary | ICD-10-CM | POA: Insufficient documentation

## 2024-06-17 DIAGNOSIS — I480 Paroxysmal atrial fibrillation: Secondary | ICD-10-CM

## 2024-06-17 MED ORDER — LINACLOTIDE 145 MCG PO CAPS: 145.0000 ug | ORAL_CAPSULE | Freq: Every day | ORAL | Status: DC

## 2024-06-18 ENCOUNTER — Ambulatory Visit: Payer: Self-pay | Admitting: Family Medicine

## 2024-06-18 LAB — LIPID PANEL
Chol/HDL Ratio: 5.3 ratio — ABNORMAL HIGH (ref 0.0–4.4)
Cholesterol, Total: 242 mg/dL — ABNORMAL HIGH (ref 100–199)
HDL: 46 mg/dL (ref >39)
LDL Chol Calc (NIH): 169 mg/dL — ABNORMAL HIGH (ref 0–99)
Triglycerides: 148 mg/dL (ref 0–149)
VLDL Cholesterol Cal: 27 mg/dL (ref 5–40)

## 2024-06-18 LAB — COMPREHENSIVE METABOLIC PANEL WITH GFR
ALT: 11 IU/L (ref 0–32)
AST: 15 IU/L (ref 0–40)
Albumin: 4.4 g/dL (ref 3.8–4.8)
Alkaline Phosphatase: 91 IU/L (ref 49–135)
BUN/Creatinine Ratio: 15 (ref 12–28)
BUN: 13 mg/dL (ref 8–27)
Bilirubin Total: 0.3 mg/dL (ref 0.0–1.2)
CO2: 24 mmol/L (ref 20–29)
Calcium: 9.8 mg/dL (ref 8.7–10.3)
Chloride: 101 mmol/L (ref 96–106)
Creatinine, Ser: 0.87 mg/dL (ref 0.57–1.00)
Globulin, Total: 2.5 g/dL (ref 1.5–4.5)
Glucose: 106 mg/dL — ABNORMAL HIGH (ref 70–99)
Potassium: 5.5 mmol/L — ABNORMAL HIGH (ref 3.5–5.2)
Sodium: 142 mmol/L (ref 134–144)
Total Protein: 6.9 g/dL (ref 6.0–8.5)
eGFR: 69 mL/min/1.73 (ref >59)

## 2024-06-18 LAB — CBC WITH DIFFERENTIAL/PLATELET
Basophils Absolute: 0.1 x10E3/uL (ref 0.0–0.2)
Basos: 1 %
EOS (ABSOLUTE): 0.2 x10E3/uL (ref 0.0–0.4)
Eos: 2 %
Hematocrit: 42.1 % (ref 34.0–46.6)
Hemoglobin: 13.6 g/dL (ref 11.1–15.9)
Immature Grans (Abs): 0 x10E3/uL (ref 0.0–0.1)
Immature Granulocytes: 0 %
Lymphocytes Absolute: 2.3 x10E3/uL (ref 0.7–3.1)
Lymphs: 28 %
MCH: 27.9 pg (ref 26.6–33.0)
MCHC: 32.3 g/dL (ref 31.5–35.7)
MCV: 86 fL (ref 79–97)
Monocytes Absolute: 0.8 x10E3/uL (ref 0.1–0.9)
Monocytes: 10 %
Neutrophils Absolute: 4.6 x10E3/uL (ref 1.4–7.0)
Neutrophils: 59 %
Platelets: 311 x10E3/uL (ref 150–450)
RBC: 4.88 x10E6/uL (ref 3.77–5.28)
RDW: 15.9 % — ABNORMAL HIGH (ref 11.7–15.4)
WBC: 7.9 x10E3/uL (ref 3.4–10.8)

## 2024-06-18 LAB — HEMOGLOBIN A1C
Est. average glucose Bld gHb Est-mCnc: 140 mg/dL
Hgb A1c MFr Bld: 6.5 % — ABNORMAL HIGH (ref 4.8–5.6)

## 2024-06-18 LAB — TSH: TSH: 1.55 u[IU]/mL (ref 0.450–4.500)

## 2024-06-18 NOTE — Assessment & Plan Note (Addendum)
-   At goal . - Recent cessation of Mounjaro  due to constipation concerns.  - Ongoing blood sugar monitoring. - Schedule follow-up appointment for diabetes management. - Continue tresiba  56 U daily  - I discussed importance of quarterly visits.  - todays visit was acute, so will have patient return for chronic visit.   Orders:   Hemoglobin A1c

## 2024-06-18 NOTE — Assessment & Plan Note (Addendum)
 COPD decompensated due to cessation of Breztri  and albuterol , causing decreased oxygen  saturation and chest tightness. Oxygen  saturation improved post-nebulizer. - Restart breztri  2 puffs twice daily.  - Perform oxygen  walk test. Normal.  - Peak flows 250,300, 250. - Provide sample of Breztri  inhaler. Orders:   Peak flow   6 minute walk; Future

## 2024-06-18 NOTE — Assessment & Plan Note (Addendum)
 Hyperlipidemia with high cholesterol levels noted in May.  Not sure patient taking lipitor as her 90 day prescription should have been exhausted by now if she was compliant.  - Schedule follow-up appointment for lipid management with Dr. Sirivol Orders:   Lipid panel

## 2024-06-19 ENCOUNTER — Encounter: Payer: Self-pay | Admitting: Family Medicine

## 2024-06-19 DIAGNOSIS — R1030 Lower abdominal pain, unspecified: Secondary | ICD-10-CM | POA: Insufficient documentation

## 2024-06-19 DIAGNOSIS — R0602 Shortness of breath: Secondary | ICD-10-CM | POA: Insufficient documentation

## 2024-06-19 DIAGNOSIS — K5901 Slow transit constipation: Secondary | ICD-10-CM | POA: Insufficient documentation

## 2024-06-19 NOTE — Patient Instructions (Signed)
  VISIT SUMMARY: Today, you were seen for shortness of breath, chest tightness, and constipation. We discussed your COPD, constipation, diabetes, hypertension, and hyperlipidemia. We have made some adjustments to your treatment plan and scheduled follow-up appointments to monitor your progress.  YOUR PLAN: CHRONIC OBSTRUCTIVE PULMONARY DISEASE (COPD): Your COPD symptoms have worsened due to not using your inhalers, leading to decreased oxygen  levels and chest tightness. -We will perform an oxygen  walk test and spirometry to assess your lung function. -You will receive a sample of the Breztri  inhaler to use.  CONSTIPATION WITH ABDOMINAL PAIN: You have been experiencing chronic constipation with abdominal pain and incomplete evacuation sensation. -We will order an abdominal x-ray to check for any underlying issues. -You will receive a sample of medication for constipation. -We will schedule monthly follow-ups until your symptoms improve.  TYPE 2 DIABETES MELLITUS: Your diabetes is being monitored, and your last A1c was 7.6. You stopped taking Mounjaro  due to constipation concerns. -Continue monitoring your blood sugar levels regularly. -We will schedule a follow-up appointment to manage your diabetes.  HYPERTENSION: Your blood pressure is elevated, but you are taking your medications as prescribed. -Continue taking your blood pressure medications as prescribed.  HYPERLIPIDEMIA: You have high cholesterol levels noted in May. -We will schedule a follow-up appointment to manage your cholesterol levels.  FOLLOW-UP: You have multiple ongoing health issues that require close monitoring. -We will ensure monthly follow-ups until your health issues are stabilized.                      Contains text generated by [employer]bridge.                                 Contains text generated by [employer]bridge.

## 2024-06-19 NOTE — Assessment & Plan Note (Addendum)
 Elevated blood pressure noted. She reports adherence to medications. Not at goal. Await labs.  Plan to increase irbesartan to 300 mg daily.   Orders:   CBC with Differential/Platelet   Comprehensive metabolic panel with GFR

## 2024-06-19 NOTE — Assessment & Plan Note (Addendum)
 Chronic constipation with abdominal pain and incomplete evacuation sensation. No bowel movements for over a week and a half. Concerns about potential underlying pathology. - Order abdominal x-ray. - Provide sample of medication for constipation.  Start on Linzess 145 mg daily.  - Schedule monthly follow-up until symptoms improve. Orders:   DG Abd 2 Views; Future   TSH

## 2024-06-19 NOTE — Assessment & Plan Note (Addendum)
 The current medical regimen is effective;  continue present plan and medications. Management per specialist. Appointment tomorrow.  Continue coreg  and xarelto.

## 2024-06-19 NOTE — Assessment & Plan Note (Addendum)
 Chronic constipation with abdominal pain and incomplete evacuation sensation. No bowel movements for over a week and a half. - Order abdominal x-ray. - Provide sample of medication for constipation. Start on Linzess 145 mg daily.  - Schedule monthly follow-up until symptoms improve. Orders:   DG Abd 2 Views; Future

## 2024-06-19 NOTE — Assessment & Plan Note (Addendum)
 Order chest xray. Copd noncompensated. Restart breztri  2 puffs twice daily.   Orders:   DG Chest 2 View; Future

## 2024-06-21 ENCOUNTER — Other Ambulatory Visit: Payer: Self-pay | Admitting: Family Medicine

## 2024-06-21 ENCOUNTER — Ambulatory Visit (INDEPENDENT_AMBULATORY_CARE_PROVIDER_SITE_OTHER): Admitting: Podiatry

## 2024-06-21 ENCOUNTER — Encounter: Payer: Self-pay | Admitting: Podiatry

## 2024-06-21 DIAGNOSIS — L84 Corns and callosities: Secondary | ICD-10-CM | POA: Diagnosis not present

## 2024-06-21 DIAGNOSIS — M79675 Pain in left toe(s): Secondary | ICD-10-CM | POA: Diagnosis not present

## 2024-06-21 DIAGNOSIS — B351 Tinea unguium: Secondary | ICD-10-CM | POA: Diagnosis not present

## 2024-06-21 DIAGNOSIS — E1140 Type 2 diabetes mellitus with diabetic neuropathy, unspecified: Secondary | ICD-10-CM

## 2024-06-21 DIAGNOSIS — M79674 Pain in right toe(s): Secondary | ICD-10-CM

## 2024-06-21 DIAGNOSIS — I739 Peripheral vascular disease, unspecified: Secondary | ICD-10-CM

## 2024-06-21 DIAGNOSIS — Z794 Long term (current) use of insulin: Secondary | ICD-10-CM

## 2024-06-21 DIAGNOSIS — K5901 Slow transit constipation: Secondary | ICD-10-CM

## 2024-06-21 MED ORDER — LINACLOTIDE 145 MCG PO CAPS: 145.0000 ug | ORAL_CAPSULE | Freq: Every day | ORAL | 0 refills | Status: DC

## 2024-06-21 NOTE — Progress Notes (Signed)
 Subjective:  Patient ID: Caitlin [name], female    DOB: 1946-04-12,  MRN: [id number]   Caitlin [name] presents to clinic today for:  Chief Complaint  Patient presents with   [facility]    [facility] with callous A1c was 6.0 in Oct  Xarelto.   Patient is a 78 year old female who comes in due to painful callus underneath the right first metatarsal head region, painful callus right subfifth metatarsal head.  She also presents with painful, thickened, elongated dystrophic toenails which are tender with shoe gear.  She is diabetic, last A1c 6.5.  She does endorse numbness and tingling in her toes. Is on xeralto. She has been using her diabetic shoes for the last couple of months, she does have a painful callus underneath the right first metatarsal head causing pain.  PCP is Cox, Abigail, MD.  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 Small internal hemorrhoids. Otherwise normal colonosopy.    LUMBAR WOUND DEBRIDEMENT N/A 08/09/2019   Procedure: LUMBAR WOUND DEBRIDEMENT Evacuation of  EPIDURAL HEMATOMA;  Surgeon: Mavis Purchase, MD;  Location: [facility] OR;  Service: Neurosurgery;  Laterality: N/A;    Allergies  Allergen Reactions   Valsartan  Swelling   Mounjaro  [Tirzepatide ] Other (See Comments)    constipation   Ozempic  (0.25 Or 0.5 Mg-Dose) [Semaglutide (0.25 Or 0.5mg -Dos)]     Constipation.   Codeine Nausea Only    Review of Systems: Negative except as noted in the HPI.  Objective:  There were no vitals filed for this visit.  Caitlin [name] is a pleasant 78 y.o. female in NAD. AAO x 3.  Vascular Examination: Capillary refill time approximately 3 seconds to toes bilateral.  Palpable DP pulses, nonpalpable PT pulses b/l LE. Digital hair absent b/l.  Skin temperature gradient WNL b/l.  Varicosities present b/l. No cyanosis noted b/l.   Dermatological Examination: Fissure present right first metatarsal head plantar aspect with significant hyperkeratotic tissue. Painful on palpation.  No full-thickness skin breakdown today.  Preulcerative in nature.  No significant erythema or edema to the right foot or left foot.  Nails x 10 are within normal limits for length, increased thickness greater than 3 mm noted and discoloration is noted.  Painful on direct dorsal palpation  Neurological Examination: Vibratory sensation diminished  bilaterally.  Protective sensation diminished bilaterally.  Musculoskeletal Examination: Muscle strength 5/5 to all LE muscle groups b/l.  Significant bunion deformities present right greater than left.  Right foot tailor's bunion present as well.     Latest Ref Rng & Units 06/17/2024   12:15 PM 12/31/2023   11:50 AM 09/24/2023   10:09 AM  Hemoglobin A1C  Hemoglobin-A1c 4.8 - 5.6 % 6.5  7.8  7.3      Assessment/Plan: 1. Type 2 diabetes  mellitus with diabetic neuropathy, with long-term current use of insulin  (HCC)   2. Pre-ulcerative calluses   3. PAD (peripheral artery disease)   4. Pain due to onychomycosis of toenails of both feet     No orders of the defined types were placed in this encounter.  None  Plan: # Preulcerative calluses x 1 right sub 1st metatarsal head with fissure - Preulcerative fissure right subfirst metatarsal head wassharply debrided using 312 scalpel blade.  No full-thickness skin breakdown today.  Continue AmLactin prescribed to help manage and soften the callus. -Discussed strategies for home management and offloading such as skin moisturization, white vinegar scrubs, felt or foam offloading pads as dispensed, shoe gear modifications, custom inserts or shoes. -Use of the diabetic shoes has been helpful.  #Onychomycosis with pain  -Nails palliatively debrided as below. -Educated on self-care  Procedure: Nail Debridement Rationale: Pain Type of Debridement: manual, sharp debridement. Instrumentation: Nail nipper, rotary burr. Number of Nails: 10   High risk footcare due to diminished PT pulses, neuropathy symptoms and trophic changes.    Return in about 3 months (around 09/21/2024) for Diabetic Foot Care.   Ethan Saddler, DPM, AACFAS [facility]     [address] [place], [place] [address]                Office [phone]  Fax [phone]

## 2024-06-21 NOTE — Progress Notes (Signed)
 Continued note... Patient does not take potassium, and only takes lasix  about a dozen times a year. Patient also stated that Lipitor causes muscle pain, so she does not take. Patient did state she was wanting to try Repatha.

## 2024-07-01 ENCOUNTER — Ambulatory Visit

## 2024-07-01 VITALS — BP 130/74 | HR 75 | Temp 97.1°F | Resp 16 | Ht 63.0 in | Wt 194.0 lb

## 2024-07-01 DIAGNOSIS — G720 Drug-induced myopathy: Secondary | ICD-10-CM

## 2024-07-01 DIAGNOSIS — J439 Emphysema, unspecified: Secondary | ICD-10-CM | POA: Diagnosis not present

## 2024-07-01 DIAGNOSIS — K5901 Slow transit constipation: Secondary | ICD-10-CM | POA: Diagnosis not present

## 2024-07-01 DIAGNOSIS — E1169 Type 2 diabetes mellitus with other specified complication: Secondary | ICD-10-CM

## 2024-07-01 DIAGNOSIS — T466X5A Adverse effect of antihyperlipidemic and antiarteriosclerotic drugs, initial encounter: Secondary | ICD-10-CM

## 2024-07-01 DIAGNOSIS — M47816 Spondylosis without myelopathy or radiculopathy, lumbar region: Secondary | ICD-10-CM

## 2024-07-01 DIAGNOSIS — F331 Major depressive disorder, recurrent, moderate: Secondary | ICD-10-CM

## 2024-07-01 DIAGNOSIS — E785 Hyperlipidemia, unspecified: Secondary | ICD-10-CM

## 2024-07-01 DIAGNOSIS — Z7409 Other reduced mobility: Secondary | ICD-10-CM

## 2024-07-01 MED ORDER — REPATHA SURECLICK 140 MG/ML ~~LOC~~ SOAJ: 140.0000 mg | SUBCUTANEOUS | 2 refills | Status: DC

## 2024-07-01 NOTE — Assessment & Plan Note (Signed)
 SABRA

## 2024-07-01 NOTE — Assessment & Plan Note (Signed)
  Orders:   Ambulatory referral to Physical Therapy

## 2024-07-01 NOTE — Progress Notes (Unsigned)
 Subjective:  Patient ID: Caitlin [name], female    DOB: Sep 08, 1945  Age: 78 y.o. MRN: [id number]  Chief Complaint  Patient presents with   Medical Management of Chronic Issues    HPI: Discussed the use of AI scribe software for clinical note transcription with the patient, who gave verbal consent to proceed.    History of Present Illness   Caitlin [name] is a 78 year old female who presents for follow-up on bowel movements and cholesterol management.  Constipation and altered bowel habits - Ongoing constipation for several months, initially presenting as hard, difficult-to-pass stools - Progression to decreased urge to defecate - Abdominal x-ray revealed significant stool burden - Initiated Linzess 145 mcg once daily, which initially normalized bowel movements but later resulted in watery stools - Previously discontinued Ozempic  due to suspicion of contributing to constipation, but symptoms persisted after cessation - Hydration with water and Zero Gatorade; believes fiber intake is adequate - No colonoscopy in several years; prior colonoscopy was normal  Low back pain and reduced mobility - Chronic low back pain - History of back surgery - Reduced physical activity and limited ability to exercise due to back pain  Hyperlipidemia and statin intolerance - Recent cholesterol level of 242 mg/dL - History of hyperlipidemia - Previous trials of simvastatin and another statin resulted in muscle aches and nausea - Dietary modifications include increased seafood intake and reduced beef and pork consumption  Bradycardia and antihypertensive medication intolerance - History of heart issues and recent monitoring for bradycardia - Discontinued carvedilol  two weeks ago, which was prescribed for blood pressure management, due to concern for bradycardia  Gait instability and falls - Balance issues with two falls in the past year - Interest in physical therapy to strengthen core muscles -  Lives in [place] and prefers physical therapy in [place]  Bladder dysfunction - History of bladder issues - Previous Botox injections in the bladder provided initial symptom improvement - Possible association between constipation and bladder symptoms            06/17/2024    9:40 AM 09/24/2023    8:54 AM 06/11/2023    8:09 AM 02/24/2023    7:35 AM 10/22/2022   11:03 AM  Depression screen PHQ 2/9  Decreased Interest 0 1 1 0 0  Down, Depressed, Hopeless 0 1 2 0 0  PHQ - 2 Score 0 2 3 0 0  Altered sleeping 0 1 2 0 1  Tired, decreased energy 0 1 2 0 1  Change in appetite 0 1 2 0 0  Feeling bad or failure about yourself  0 0 0 0 0  Trouble concentrating 0 0 1 0 0  Moving slowly or fidgety/restless 0 0 1 0 0  Suicidal thoughts 0 0 0 0 0  PHQ-9 Score 0  5  11  0  2   Difficult doing work/chores Not difficult at all Somewhat difficult Somewhat difficult Not difficult at all Not difficult at all     Data saved with a previous flowsheet row definition        06/17/2024    9:41 AM  Fall Risk   Falls in the past year? 1  Number falls in past yr: 0  Injury with Fall? 0  Risk for fall due to : Other (Comment)  Risk for fall due to: Comment Bp elevated  Follow up Falls evaluation completed    Patient Care Team: Sherre Clapper, MD as PCP - General (Family Medicine) Braulio Hough,  MD as Referring Physician (Internal Medicine) Onita Duos, MD as Consulting Physician (Neurology) Monetta Redell PARAS, MD as Consulting Physician (Cardiology) Steen Evalene CROME., MD (Urology) Joshua Alm Hamilton, MD as Consulting Physician (Neurosurgery) Myrick Elspeth PARAS., DPM (Podiatry)   Review of Systems  Constitutional:  Negative for appetite change, fatigue and fever.  HENT:  Negative for congestion, ear pain, sinus pressure and sore throat.   Respiratory:  Negative for cough, chest tightness, shortness of breath and wheezing.   Cardiovascular:  Negative for chest pain and palpitations.  Gastrointestinal:   Positive for constipation. Negative for abdominal pain, diarrhea, nausea and vomiting.  Genitourinary:  Negative for dysuria and hematuria.  Musculoskeletal:  Positive for back pain. Negative for arthralgias, joint swelling and myalgias.  Skin:  Negative for rash.  Neurological:  Negative for dizziness, weakness and headaches.  Psychiatric/Behavioral:  Negative for dysphoric mood. The patient is not nervous/anxious.     Current Outpatient Medications on File Prior to Visit  Medication Sig Dispense Refill   albuterol  (PROVENTIL ) (2.5 MG/3ML) 0.083% nebulizer solution Take 3 mLs (2.5 mg total) by nebulization every 6 (six) hours as needed for wheezing or shortness of breath. 75 mL 0   ammonium lactate  (AMLACTIN) 12 % cream Apply 1 Application topically as needed for dry skin. 385 g 0   BD PEN NEEDLE NANO 2ND GEN 32G X 4 MM MISC 1 EACH BY DOES NOT APPLY ROUTE IN THE MORNING AND AT BEDTIME. 100 each 2   Biotin 89999 MCG TABS Take by mouth.     Budeson-Glycopyrrol-Formoterol  (BREZTRI  AEROSPHERE) 160-9-4.8 MCG/ACT AERO Inhale 2 puffs into the lungs in the morning and at bedtime. 10.7 g 3   calcium  carbonate (TUMS - DOSED IN MG ELEMENTAL CALCIUM ) 500 MG chewable tablet Chew 2 tablets by mouth daily as needed for indigestion.      carvedilol  (COREG ) 6.25 MG tablet TAKE 1 TABLET BY MOUTH TWICE A DAY 180 tablet 1   Continuous Glucose Receiver (FREESTYLE LIBRE 3 READER) DEVI Apply 1 each topically daily.     cyanocobalamin (VITAMIN B12) 1000 MCG tablet Take 1,000 mcg by mouth daily.     escitalopram (LEXAPRO) 10 MG tablet Take 10 mg by mouth daily.     Evolocumab 140 MG/ML SOAJ Inject 140 mg into the skin every 14 (fourteen) days.     fesoterodine (TOVIAZ) 8 MG TB24 tablet Take 8 mg by mouth.     furosemide  (LASIX ) 80 MG tablet TAKE 1 TABLET BY MOUTH 2 TIMES DAILY. 180 tablet 0   gabapentin  (NEURONTIN ) 600 MG tablet Take 1 tablet (600 mg total) by mouth 3 (three) times daily. 90 tablet 3   insulin   degludec (TRESIBA  FLEXTOUCH) 200 UNIT/ML FlexTouch Pen Inject 56 Units into the skin every evening. 27 mL 0   Insulin  Pen Needle 31G X 8 MM MISC Inject 1 Units into the skin daily.     irbesartan (AVAPRO) 150 MG tablet Take 150 mg by mouth daily.     linaclotide (LINZESS) 145 MCG CAPS capsule Take 1 capsule (145 mcg total) by mouth daily before breakfast. 30 capsule 0   mupirocin  ointment (BACTROBAN ) 2 % Apply 1 Application topically 2 (two) times daily. 30 g 2   MYRBETRIQ  25 MG TB24 tablet TAKE 1 TABLET (25 MG TOTAL) BY MOUTH DAILY. 90 tablet 1   nystatin cream (MYCOSTATIN) Apply 1 Application topically 2 (two) times daily.     omeprazole  (PRILOSEC) 40 MG capsule TAKE 1 CAPSULE (40 MG TOTAL) BY MOUTH IN  THE MORNING AND AT BEDTIME. 180 capsule 1   ondansetron  (ZOFRAN ) 4 MG tablet Take 1 tablet (4 mg total) by mouth every 8 (eight) hours as needed for nausea or vomiting. 20 tablet 0   rivaroxaban (XARELTO) 20 MG TABS tablet Take by mouth at bedtime.     traMADol  (ULTRAM ) 50 MG tablet Take 1 tablet (50 mg total) by mouth every 6 (six) hours as needed for severe pain (pain score 7-10) (lumbar pain/hip pain). 20 tablet 0   VITAMIN D-VITAMIN K PO Take 1 tablet by mouth every evening.     OZEMPIC , 1 MG/DOSE, 4 MG/3ML SOPN INJECT 1 MG SUBCUTANEOUS ONCE A WEEK 30 DAYS (Patient not taking: Reported on 07/01/2024)     triamcinolone  cream (KENALOG ) 0.1 % Apply topically 2 (two) times daily. (Patient not taking: Reported on 07/01/2024)     zaleplon (SONATA) 5 MG capsule Take by mouth. (Patient not taking: Reported on 07/01/2024)     No current facility-administered medications on file prior to visit.   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 Small internal hemorrhoids. Otherwise normal colonosopy.    LUMBAR WOUND DEBRIDEMENT N/A 08/09/2019   Procedure: LUMBAR WOUND DEBRIDEMENT Evacuation of  EPIDURAL HEMATOMA;  Surgeon: Mavis Purchase, MD;  Location: [facility] OR;  Service: Neurosurgery;  Laterality: N/A;    Family History  Problem Relation Age of Onset   Breast cancer Mother 47   Heart attack Father 67   Mental illness Sister    Heart Problems Brother 34   Cancer Brother    Breast cancer Paternal Grandmother    Colon cancer Paternal Aunt 72   Social History   Socioeconomic History   Marital status: Married    Spouse name: Alm   Number of children: Not on file   Years of education: Not on file   Highest education level: Not on file  Occupational History   Occupation: Retired Pensions Consultant  Tobacco Use   Smoking status: Never    Passive exposure: Past   Smokeless tobacco: Never  Vaping Use   Vaping status: Never Used  Substance and Sexual Activity   Alcohol use: Not Currently   Drug use: Never   Sexual activity: Not Currently  Other Topics Concern   Not  on file  Social  History Narrative   Not on file   Social Drivers of Health   Financial Resource Strain: Low Risk  (02/02/2024)   Overall Financial Resource Strain (CARDIA)    Difficulty of Paying Living Expenses: Not hard at all  Food Insecurity: No Food Insecurity (02/02/2024)   Hunger Vital Sign    Worried About Running Out of Food in the Last Year: Never true    Ran Out of Food in the Last Year: Never true  Transportation Needs: No Transportation Needs (02/02/2024)   PRAPARE - Administrator, Civil Service (Medical): No    Lack of Transportation (Non-Medical): No  Physical Activity: Inactive (02/02/2024)   Exercise Vital Sign    Days of Exercise per Week: 0 days    Minutes of Exercise per Session: 0 min  Stress: No Stress Concern Present (02/02/2024)   [employer] of Occupational Health - Occupational Stress Questionnaire    Feeling of Stress: Not at all  Social Connections: Moderately Integrated (02/02/2024)   Social Connection and Isolation Panel    Frequency of Communication with Friends and Family: Twice a week    Frequency of Social Gatherings with Friends and Family: Twice a week    Attends Religious Services: More than 4 times per year    Active Member of [employer] or Organizations: No    Attends Engineer, Structural: Never    Marital Status: Married    Objective:  BP 130/74   Pulse 75   Temp (!) 97.1 F (36.2 C)   Resp 16   Ht 5' 3 (1.6 m)   Wt 194 lb (88 kg)   SpO2 94%   BMI 34.37 kg/m      07/01/2024    1:41 PM 06/17/2024    9:38 AM 02/02/2024   11:33 AM  BP/Weight  Systolic BP 130 154 136  Diastolic BP 74 92 70  Wt. (Lbs) 194 194   BMI 34.37 kg/m2 34.37 kg/m2     Physical Exam Vitals and nursing note reviewed.  Constitutional:      Appearance: She is obese.  HENT:     Head: Normocephalic and atraumatic.  Cardiovascular:     Rate and Rhythm: Normal rate and regular rhythm.  Pulmonary:     Effort: Pulmonary effort is  normal.     Breath sounds: Normal breath sounds.  Abdominal:     General: Bowel sounds are normal. There is no distension.     Palpations: There is no mass.     Tenderness: There is no abdominal tenderness.  Musculoskeletal:        General: Normal range of motion.  Neurological:     General: No focal deficit present.     Mental Status: She is alert and oriented to person, place, and time.  Psychiatric:        Mood and Affect: Mood normal.         Lab Results  Component Value Date   WBC 7.9 06/17/2024   HGB 13.6 06/17/2024   HCT 42.1 06/17/2024   PLT 311 06/17/2024   GLUCOSE 106 (H) 06/17/2024   CHOL 242 (H) 06/17/2024   TRIG 148 06/17/2024   HDL 46 06/17/2024   LDLCALC 169 (H) 06/17/2024   ALT 11 06/17/2024   AST 15 06/17/2024   NA 142 06/17/2024   K 5.5 (H) 06/17/2024   CL 101 06/17/2024   CREATININE 0.87 06/17/2024   BUN 13 06/17/2024   CO2 24 06/17/2024   TSH 1.550  06/17/2024   INR 0.9 08/04/2019   HGBA1C 6.5 (H) 06/17/2024    Results for orders placed or performed in visit on 06/17/24  CBC with Differential/Platelet   Collection Time: 06/17/24 12:15 PM  Result Value Ref Range   WBC 7.9 3.4 - 10.8 x10E3/uL   RBC 4.88 3.77 - 5.28 x10E6/uL   Hemoglobin 13.6 11.1 - 15.9 g/dL   Hematocrit 57.8 65.9 - 46.6 %   MCV 86 79 - 97 fL   MCH 27.9 26.6 - 33.0 pg   MCHC 32.3 31.5 - 35.7 g/dL   RDW 84.0 (H) 88.2 - 84.5 %   Platelets 311 150 - 450 x10E3/uL   Neutrophils 59 Not Estab. %   Lymphs 28 Not Estab. %   Monocytes 10 Not Estab. %   Eos 2 Not Estab. %   Basos 1 Not Estab. %   Neutrophils Absolute 4.6 1.4 - 7.0 x10E3/uL   Lymphocytes Absolute 2.3 0.7 - 3.1 x10E3/uL   Monocytes Absolute 0.8 0.1 - 0.9 x10E3/uL   EOS (ABSOLUTE) 0.2 0.0 - 0.4 x10E3/uL   Basophils Absolute 0.1 0.0 - 0.2 x10E3/uL   Immature Granulocytes 0 Not Estab. %   Immature Grans (Abs) 0.0 0.0 - 0.1 x10E3/uL  Comprehensive metabolic panel with GFR   Collection Time: 06/17/24 12:15 PM   Result Value Ref Range   Glucose 106 (H) 70 - 99 mg/dL   BUN 13 8 - 27 mg/dL   Creatinine, Ser 9.12 0.57 - 1.00 mg/dL   eGFR 69 >40 fO/fpw/8.26   BUN/Creatinine Ratio 15 12 - 28   Sodium 142 134 - 144 mmol/L   Potassium 5.5 (H) 3.5 - 5.2 mmol/L   Chloride 101 96 - 106 mmol/L   CO2 24 20 - 29 mmol/L   Calcium  9.8 8.7 - 10.3 mg/dL   Total Protein 6.9 6.0 - 8.5 g/dL   Albumin  4.4 3.8 - 4.8 g/dL   Globulin, Total 2.5 1.5 - 4.5 g/dL   Bilirubin Total 0.3 0.0 - 1.2 mg/dL   Alkaline Phosphatase 91 49 - 135 IU/L   AST 15 0 - 40 IU/L   ALT 11 0 - 32 IU/L  Hemoglobin A1c   Collection Time: 06/17/24 12:15 PM  Result Value Ref Range   Hgb A1c MFr Bld 6.5 (H) 4.8 - 5.6 %   Est. average glucose Bld gHb Est-mCnc 140 mg/dL  Lipid panel   Collection Time: 06/17/24 12:15 PM  Result Value Ref Range   Cholesterol, Total 242 (H) 100 - 199 mg/dL   Triglycerides 851 0 - 149 mg/dL   HDL 46 >60 mg/dL   VLDL Cholesterol Cal 27 5 - 40 mg/dL   LDL Chol Calc (NIH) 830 (H) 0 - 99 mg/dL   Chol/HDL Ratio 5.3 (H) 0.0 - 4.4 ratio  TSH   Collection Time: 06/17/24 12:15 PM  Result Value Ref Range   TSH 1.550 0.450 - 4.500 uIU/mL  .  Assessment & Plan:   Assessment & Plan Slow transit constipation Slow transit constipation Chronic constipation with hard stools and lack of urge to defecate. Previous x-ray showed moderate colonic stool burden. Linzess initially improved bowel movements but led to diarrhea. No improvement after discontinuing Ozempic . Possible age-related slowing of bowel transit. No current use of pain medications contributing to constipation. - Take Linzess every other day to manage bowel movements. - Increase water intake and maintain adequate fiber intake. - Referred to physical therapy for exercises to improve bowel motility and core strength. -  Will consider colonoscopy if symptoms persist or worsen. Orders:   Ambulatory referral to Physical Therapy  Chronic obstructive pulmonary  disease with emphysema, unspecified emphysema type (HCC) Stable. On proventil  nebulzer solution as needed, Breztri  for maintenance,     Moderate recurrent major depression (HCC) On lexapro 10 mg daily. Doing well    Spondylosis without myelopathy or radiculopathy, lumbar region Reduced mobility and lumbar spondylosis Chronic low back pain for over five years, limiting mobility. Previous back surgery. Interest in physical therapy to improve core strength and mobility. - Referred to physical therapy for core strengthening and mobility exercises. Orders:   Ambulatory referral to Physical Therapy  Impaired functional mobility, balance, gait, and endurance Balance impairment with history of falls Balance issues with two falls this year. Interest in improving balance and preventing falls through physical therapy. - Referred to physical therapy for balance improvement and fall prevention. 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 Sent Repatha.     Hyperlipidemia associated with type 2 diabetes mellitus (HCC) Hypercholesterolemia with statin intolerance Cholesterol level at 242 mg/dL, above target of 800 mg/dL or less. Statin intolerance with muscle aches and nausea. Previous attempts with simvastatin and another statin were unsuccessful. Repatha considered as an alternative treatment. Insurance may require documentation of statin intolerance. - Sent prescription for Repatha to pharmacy. - Documented statin intolerance in medical record to support insurance approval.      Type 2 diabetes mellitus, controlled without complications with long term use of insulin  Diabetes is well-controlled with current regimen including Tresiba .   Urinary urgency and incontinence Previous Botox treatment for bladder issues. Initial improvement noted, but may require repeat  treatment. Constipation and bladder issues may be linked. - Continue to monitor urinary symptoms and consider repeat Botox treatment if symptoms worsen.         Body mass index is 34.37 kg/m.  Assessment and Plan      Meds ordered this encounter  Medications   Evolocumab (REPATHA SURECLICK) 140 MG/ML SOAJ    Sig: Inject 140 mg into the skin every 14 (fourteen) days.    Dispense:  2 mL    Refill:  2    Patient had statin myopathy from Atorvastatin  and Simvastatin. Has diabetes type 2, increased cardiac risk. Please dispense Repatha    Orders Placed This Encounter  Procedures   Ambulatory referral to Physical Therapy       Follow-up: No follow-ups on file.  An After Visit Summary was printed and given to the patient.      Merwyn Hodapp, MD [facility] [phone]

## 2024-07-01 NOTE — Assessment & Plan Note (Addendum)
 Orders:    Ambulatory referral to Physical Therapy

## 2024-07-01 NOTE — Patient Instructions (Addendum)
 VISIT SUMMARY: Today, we discussed your ongoing constipation, cholesterol management, low back pain, balance issues, and bladder dysfunction. We made adjustments to your medications and referred you to physical therapy to help with your symptoms.  YOUR PLAN: CONSTIPATION: You have been experiencing chronic constipation with hard stools and a lack of urge to defecate. Linzess initially helped but later caused diarrhea. -Take Linzess every other day to manage bowel movements. -Increase your water intake and maintain adequate fiber intake. -You are referred to physical therapy for exercises to improve bowel motility and core strength. -We will consider a colonoscopy if your symptoms persist or worsen.  HIGH CHOLESTEROL: Your cholesterol level is 242 mg/dL, which is above the target. You have had muscle aches and nausea with previous statin medications. -A prescription for Repatha has been sent to the pharmacy. -We have documented your statin intolerance to support insurance approval for Repatha.  TYPE 2 DIABETES: Your diabetes is well-controlled with your current regimen. -Continue your current diabetes medications, including Tresiba .  LOW BACK PAIN AND REDUCED MOBILITY: You have chronic low back pain and reduced mobility, limiting your ability to exercise. -You are referred to physical therapy for core strengthening and mobility exercises.  BALANCE ISSUES AND FALLS: You have had balance issues and two falls this year. You are interested in improving your balance and preventing falls. -You are referred to physical therapy for balance improvement and fall prevention.  BLADDER DYSFUNCTION: You have a history of bladder issues and have previously had Botox treatment, which initially helped. -We will continue to monitor your urinary symptoms and consider repeat Botox treatment if your symptoms worsen.                      Contains text generated by Abridge.                                  Contains text generated by Abridge.  Managing Pain Without Opioids Opioids are strong medicines used to treat moderate to severe pain. For some people, especially those who have long-term (chronic) pain, opioids may not be the best choice for pain management due to: Side effects like nausea, constipation, and sleepiness. The risk of addiction (opioid use disorder). The longer you take opioids, the greater your risk of addiction. Pain that lasts for more than 3 months is called chronic pain. Managing chronic pain usually requires more than one approach and is often provided by a team of health care providers working together (multidisciplinary approach). Pain management may be done at a pain management center or pain clinic. How to manage pain without the use of opioids Use non-opioid medicines Non-opioid medicines for pain may include: Over-the-counter or prescription non-steroidal anti-inflammatory drugs (NSAIDs). These may be the first medicines used for pain. They work well for muscle and bone pain, and they reduce swelling. Acetaminophen . This over-the-counter medicine may work well for milder pain but not swelling. Antidepressants. These may be used to treat chronic pain. A certain type of antidepressant (tricyclics) is often used. These medicines are given in lower doses for pain than when used for depression. Anticonvulsants. These are usually used to treat seizures but may also reduce nerve (neuropathic) pain. Muscle relaxants. These relieve pain caused by sudden muscle tightening (spasms). You may also use a pain medicine that is applied to the skin as a patch, cream, or gel (topical analgesic), such as a numbing medicine. These may cause  fewer side effects than medicines taken by mouth. Do certain therapies as directed Some therapies can help with pain management. They include: Physical therapy. You will do exercises to gain  strength and flexibility. A physical therapist may teach you exercises to move and stretch parts of your body that are weak, stiff, or painful. You can learn these exercises at physical therapy visits and practice them at home. Physical therapy may also involve: Massage. Heat wraps or applying heat or cold to affected areas. Electrical signals that interrupt pain signals (transcutaneous electrical nerve stimulation, TENS). Weak lasers that reduce pain and swelling (low-level laser therapy). Signals from your body that help you learn to regulate pain (biofeedback). Occupational therapy. This helps you to learn ways to function at home and work with less pain. Recreational therapy. This involves trying new activities or hobbies, such as a physical activity or drawing. Mental health therapy, including: Cognitive behavioral therapy (CBT). This helps you learn coping skills for dealing with pain. Acceptance and commitment therapy (ACT) to change the way you think and react to pain. Relaxation therapies, including muscle relaxation exercises and mindfulness-based stress reduction. Pain management counseling. This may be individual, family, or group counseling.  Receive medical treatments Medical treatments for pain management include: Nerve block injections. These may include a pain blocker and anti-inflammatory medicines. You may have injections: Near the spine to relieve chronic back or neck pain. Into joints to relieve back or joint pain. Into nerve areas that supply a painful area to relieve body pain. Into muscles (trigger point injections) to relieve some painful muscle conditions. A medical device placed near your spine to help block pain signals and relieve nerve pain or chronic back pain (spinal cord stimulation device). Acupuncture. Follow these instructions at home Medicines Take over-the-counter and prescription medicines only as told by your health care provider. If you are taking  pain medicine, ask your health care providers about possible side effects to watch out for. Do not drive or use heavy machinery while taking prescription opioid pain medicine. Lifestyle  Do not use drugs or alcohol to reduce pain. If you drink alcohol, limit how much you have to: 0-1 drink a day for women who are not pregnant. 0-2 drinks a day for men. Know how much alcohol is in a drink. In the [place] one drink equals one 12 oz bottle of beer (355 mL), one 5 oz glass of wine (148 mL), or one 1 oz glass of hard liquor (44 mL). Do not use any products that contain nicotine or tobacco. These products include cigarettes, chewing tobacco, and vaping devices, such as e-cigarettes. If you need help quitting, ask your health care provider. Eat a healthy diet and maintain a healthy weight. Poor diet and excess weight may make pain worse. Eat foods that are high in fiber. These include fresh fruits and vegetables, whole grains, and beans. Limit foods that are high in fat and processed sugars, such as fried and sweet foods. Exercise regularly. Exercise lowers stress and may help relieve pain. Ask your health care provider what activities and exercises are safe for you. If your health care provider approves, join an exercise class that combines movement and stress reduction. Examples include yoga and tai chi. Get enough sleep. Lack of sleep may make pain worse. Lower stress as much as possible. Practice stress reduction techniques as told by your therapist. General instructions Work with all your pain management providers to find the treatments that work best for you. You are an  important member of your pain management team. There are many things you can do to reduce pain on your own. Consider joining an online or in-person support group for people who have chronic pain. Keep all follow-up visits. This is important. Where to find more information You can find more information about managing pain without  opioids from: American Academy of Pain Medicine: painmed.org Institute for Chronic Pain: instituteforchronicpain.org American Chronic Pain Association: theacpa.org Contact a health care provider if: You have side effects from pain medicine. Your pain gets worse or does not get better with treatments or home therapy. You are struggling with anxiety or depression. Summary Many types of pain can be managed without opioids. Chronic pain may respond better to pain management without opioids. Pain is best managed when you and a team of health care providers work together. Pain management without opioids may include non-opioid medicines, medical treatments, physical therapy, mental health therapy, and lifestyle changes. Tell your health care providers if your pain gets worse or is not being managed well enough. This information is not intended to replace advice given to you by your health care provider. Make sure you discuss any questions you have with your health care provider. Document Revised: 11/15/2020 Document Reviewed: 11/15/2020 Elsevier Patient Education  2024 [employer].

## 2024-07-19 ENCOUNTER — Ambulatory Visit

## 2024-07-19 VITALS — BP 156/98 | HR 61 | Temp 97.7°F | Ht 63.0 in | Wt 195.0 lb

## 2024-07-19 DIAGNOSIS — M3500 Sicca syndrome, unspecified: Secondary | ICD-10-CM | POA: Insufficient documentation

## 2024-07-19 DIAGNOSIS — K5901 Slow transit constipation: Secondary | ICD-10-CM | POA: Diagnosis not present

## 2024-07-19 DIAGNOSIS — Z794 Long term (current) use of insulin: Secondary | ICD-10-CM

## 2024-07-19 DIAGNOSIS — M79643 Pain in unspecified hand: Secondary | ICD-10-CM | POA: Insufficient documentation

## 2024-07-19 DIAGNOSIS — R195 Other fecal abnormalities: Secondary | ICD-10-CM | POA: Insufficient documentation

## 2024-07-19 DIAGNOSIS — E113559 Type 2 diabetes mellitus with stable proliferative diabetic retinopathy, unspecified eye: Secondary | ICD-10-CM | POA: Diagnosis not present

## 2024-07-19 DIAGNOSIS — Z7985 Long-term (current) use of injectable non-insulin antidiabetic drugs: Secondary | ICD-10-CM

## 2024-07-19 DIAGNOSIS — M19049 Primary osteoarthritis, unspecified hand: Secondary | ICD-10-CM | POA: Insufficient documentation

## 2024-07-19 DIAGNOSIS — I10 Essential (primary) hypertension: Secondary | ICD-10-CM

## 2024-07-19 DIAGNOSIS — M154 Erosive (osteo)arthritis: Secondary | ICD-10-CM | POA: Insufficient documentation

## 2024-07-19 DIAGNOSIS — M2550 Pain in unspecified joint: Secondary | ICD-10-CM | POA: Insufficient documentation

## 2024-07-19 MED ORDER — HYDROCHLOROTHIAZIDE 12.5 MG PO TABS: 12.5000 mg | ORAL_TABLET | Freq: Every day | ORAL | 3 refills | Status: AC

## 2024-07-19 NOTE — Patient Instructions (Addendum)
                         Contains text generated by Abridge.                                 Contains text generated by Abridge.   VISIT SUMMARY: During your visit, we addressed your uncontrolled high blood pressure, recent changes in bowel habits, and stable diabetes management. We made adjustments to your medication and provided referrals for further evaluation.  YOUR PLAN: ESSENTIAL HYPERTENSION: Your blood pressure has been significantly elevated despite your current medications. -Added hydrochlorothiazide 12.5 mg daily to your current regimen. -Continue taking irbesartan 150 mg daily and amlodipine 5 mg daily. -Monitor your blood pressure at home with an arm cuff. -Contact cardiology for a follow-up appointment. -Reduce salt intake if possible.  CHRONIC CONSTIPATION WITH ABNORMAL STOOL CONSISTENCY: You have been experiencing thin, broken stools occurring 3-4 times daily. -Referred to a gastroenterologist for further evaluation. -Continue taking Benefiber one teaspoon daily. -Monitor your bowel movements and report any changes.  TYPE 2 DIABETES MELLITUS: Your diabetes is stable with no new issues noted. -Continue your current diabetes management plan.                      Contains text generated by Abridge.                                 Contains text generated by Abridge.   VISIT SUMMARY: During your visit, we addressed your uncontrolled high blood pressure, recent changes in bowel habits, and stable diabetes management. We made adjustments to your medication and provided referrals for further evaluation.  YOUR PLAN: ESSENTIAL HYPERTENSION: Your blood pressure has been significantly elevated despite your current medications. -Added hydrochlorothiazide 12.5 mg daily to your current regimen. -Continue taking irbesartan 150 mg daily and amlodipine 5 mg daily. -Monitor your blood  pressure at home with an arm cuff. -Contact cardiology for a follow-up appointment. -Reduce salt intake if possible.  CHRONIC CONSTIPATION WITH ABNORMAL STOOL CONSISTENCY: You have been experiencing thin, broken stools occurring 3-4 times daily. -Referred to a gastroenterologist for further evaluation. -Continue taking Benefiber one teaspoon daily. -Monitor your bowel movements and report any changes.  TYPE 2 DIABETES MELLITUS: Your diabetes is stable with no new issues noted. -Continue your current diabetes management plan.                      Contains text generated by Abridge.                                 Contains text generated by Abridge.

## 2024-07-19 NOTE — Assessment & Plan Note (Signed)
 Orders:    Ambulatory referral to Gastroenterology

## 2024-07-19 NOTE — Progress Notes (Unsigned)
 Subjective:  Patient ID: Caitlin [name], female    DOB: [birth year]  Age: 78 y.o. MRN: [id number]  Chief Complaint  Patient presents with  . Hospitalization Follow-up    HPI: Discussed the use of AI scribe software for clinical note transcription with the patient, who gave verbal consent to proceed.  History of Present Illness   Caitlin [name] is a 78 year old female with hypertension who presents with uncontrolled high blood pressure.  Hypertension - Significantly elevated blood pressure readings, with a recent measurement of 228 mmHg systolic, hence her neurologist sent her to the ED.  - Blood pressure in the emergency room fluctuated between 199 mmHg and 237 mmHg - Administered intravenous fluids in the emergency room - Initiated amlodipine 5 mg in addition to existing irbesartan 150 mg regimen - Despite interventions, blood pressure remains elevated: recent home reading of 202 mmHg, decreased to 162 mmHg - Previously discontinued carvedilol  due to bradycardia - Current antihypertensive regimen: irbesartan 150 mg daily, amlodipine 5 mg daily - Occasional use of furosemide  (Lasix ) 80 mg as needed for bloating, not taken regularly - No added salt to food, limited meat intake, frequent consumption of greens such as collard greens, infrequent restaurant meals - Stable weight  Cardiac monitoring - Implanted loop recorder for cardiac monitoring with automatic data transmission  Altered bowel habits - Recent onset of thin, broken stools with unusual odor - Increased stool frequency to three to four times daily - Previously used Linzess  for bowel movements, not resumed since onset of current symptoms - Colonoscopy in February 2020 for rectal bleeding showed diverticulosis and hemorrhoids, otherwise normal  Diabetes mellitus - History of diabetes mellitus            06/17/2024    9:40 AM 09/24/2023    8:54 AM 06/11/2023    8:09 AM 02/24/2023    7:35 AM 10/22/2022   11:03 AM   Depression screen PHQ 2/9  Decreased Interest 0 1 1 0 0  Down, Depressed, Hopeless 0 1 2 0 0  PHQ - 2 Score 0 2 3 0 0  Altered sleeping 0 1 2 0 1  Tired, decreased energy 0 1 2 0 1  Change in appetite 0 1 2 0 0  Feeling bad or failure about yourself  0 0 0 0 0  Trouble concentrating 0 0 1 0 0  Moving slowly or fidgety/restless 0 0 1 0 0  Suicidal thoughts 0 0 0 0 0  PHQ-9 Score 0  5  11  0  2   Difficult doing work/chores Not difficult at all Somewhat difficult Somewhat difficult Not difficult at all Not difficult at all     Data saved with a previous flowsheet row definition        06/17/2024    9:41 AM  Fall Risk   Falls in the past year? 1  Number falls in past yr: 0  Injury with Fall? 0   Risk for fall due to : Other (Comment)  Risk for fall due to: Comment Bp elevated  Follow up Falls evaluation completed     Data saved with a previous flowsheet row definition    Patient Care Team: Sherre Clapper, MD as PCP - General (Family Medicine) Braulio Hough, MD as Referring Physician (Internal Medicine) Onita Duos, MD as Consulting Physician (Neurology) Monetta Redell PARAS, MD as Consulting Physician (Cardiology) Steen Evalene CROME., MD (Urology) Joshua Alm Hamilton, MD as Consulting Physician (Neurosurgery) Myrick Elspeth PARAS., DPM (Podiatry)  Review of Systems  Constitutional:  Negative for chills, fatigue and fever.  HENT:  Negative for congestion, ear pain, sinus pressure and sore throat.   Respiratory:  Negative for cough and shortness of breath.   Cardiovascular:  Negative for chest pain.  Gastrointestinal:  Negative for abdominal pain, constipation, diarrhea, nausea and vomiting.  Genitourinary:  Negative for dysuria and frequency.  Musculoskeletal:  Negative for arthralgias, back pain and myalgias.  Neurological:  Negative for dizziness and headaches.  Psychiatric/Behavioral:  Negative for dysphoric mood. The patient is not nervous/anxious.     Current Outpatient  Medications on File Prior to Visit  Medication Sig Dispense Refill  . albuterol  (PROVENTIL ) (2.5 MG/3ML) 0.083% nebulizer solution Take 3 mLs (2.5 mg total) by nebulization every 6 (six) hours as needed for wheezing or shortness of breath. 75 mL 0  . amLODipine (NORVASC) 5 MG tablet Take 5 mg by mouth daily.    . ammonium lactate  (AMLACTIN) 12 % cream Apply 1 Application topically as needed for dry skin. 385 g 0  . BD PEN NEEDLE NANO 2ND GEN 32G X 4 MM MISC 1 EACH BY DOES NOT APPLY ROUTE IN THE MORNING AND AT BEDTIME. 100 each 2  . Biotin 89999 MCG TABS Take by mouth.    . Budeson-Glycopyrrol-Formoterol  (BREZTRI  AEROSPHERE) 160-9-4.8 MCG/ACT AERO Inhale 2 puffs into the lungs in the morning and at bedtime. 10.7 g 3  . calcium  carbonate (TUMS - DOSED IN MG ELEMENTAL CALCIUM ) 500 MG chewable tablet Chew 2 tablets by mouth daily as needed for indigestion.     . Cholecalciferol (VITAMIN D-3) 125 MCG (5000 UT) TABS Take 1 tablet by mouth daily.    . Continuous Glucose Receiver (FREESTYLE LIBRE 3 READER) DEVI Apply 1 each topically daily.    . cyanocobalamin (VITAMIN B12) 1000 MCG tablet Take 1,000 mcg by mouth daily.    SABRA escitalopram (LEXAPRO) 10 MG tablet Take 10 mg by mouth daily.    . furosemide  (LASIX ) 80 MG tablet TAKE 1 TABLET BY MOUTH 2 TIMES DAILY. 180 tablet 0  . gabapentin  (NEURONTIN ) 600 MG tablet Take 1 tablet (600 mg total) by mouth 3 (three) times daily. 90 tablet 3  . insulin  degludec (TRESIBA  FLEXTOUCH) 200 UNIT/ML FlexTouch Pen Inject 56 Units into the skin every evening. 27 mL 0  . Insulin  Pen Needle 31G X 8 MM MISC Inject 1 Units into the skin daily.    . irbesartan (AVAPRO) 150 MG tablet Take 150 mg by mouth daily.    . linaclotide  (LINZESS ) 145 MCG CAPS capsule Take 1 capsule (145 mcg total) by mouth daily before breakfast. 30 capsule 0  . mupirocin  ointment (BACTROBAN ) 2 % Apply 1 Application topically 2 (two) times daily. 30 g 2  . nystatin cream (MYCOSTATIN) Apply 1  Application topically 2 (two) times daily.    . omeprazole  (PRILOSEC) 40 MG capsule TAKE 1 CAPSULE (40 MG TOTAL) BY MOUTH IN THE MORNING AND AT BEDTIME. 180 capsule 1  . ondansetron  (ZOFRAN ) 4 MG tablet Take 1 tablet (4 mg total) by mouth every 8 (eight) hours as needed for nausea or vomiting. 20 tablet 0  . OZEMPIC , 1 MG/DOSE, 4 MG/3ML SOPN INJECT 1 MG SUBCUTANEOUS ONCE A WEEK 30 DAYS    . rivaroxaban (XARELTO) 20 MG TABS tablet Take by mouth at bedtime.    . traMADol  (ULTRAM ) 50 MG tablet Take 1 tablet (50 mg total) by mouth every 6 (six) hours as needed for severe pain (pain score 7-10) (  lumbar pain/hip pain). 20 tablet 0  . triamcinolone  cream (KENALOG ) 0.1 % Apply topically 2 (two) times daily.     No current facility-administered medications on file prior to visit.   Past Medical History:  Diagnosis Date  . Arthritis   . Asthma   . Atrial fibrillation (HCC)   . Cauda equina syndrome (HCC) 12/13/2019  . Complication of anesthesia   . COPD (chronic obstructive pulmonary disease) (HCC)   . Depression   . Diabetes mellitus without complication (HCC)    Type II  . Diabetic neuropathy, type II diabetes mellitus (HCC) 12/13/2019  . Dysrhythmia   . Facial contusion 10/10/2022  . GERD (gastroesophageal reflux disease)   . HTN (hypertension)   . Hypercholesteremia   . 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) 12/13/2019  . 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) 12/13/2019  . Morbid obesity (HCC) 12/13/2019  . Obstructive sleep apnea   . PONV (postoperative nausea and vomiting)    not the last few times  . RLS (restless legs syndrome)   . S/P ablation of atrial flutter 02/12/2016  . S/P lumbar fusion 08/04/2019  . Sleep apnea   . Upper respiratory tract infection due to COVID-19 virus 08/14/2021   Past Surgical History:  Procedure Laterality Date  . ATRIAL FIBRILLATION ABLATION     x3  . CATARACT EXTRACTION Bilateral   .  COLONOSCOPY  01/04/2013   Moderate predominantly sigmoid diverticulosis. Small internal hemorrhoids. Otherwise normal colonosopy.   . LUMBAR WOUND DEBRIDEMENT N/A 08/09/2019   Procedure: LUMBAR WOUND DEBRIDEMENT Evacuation of  EPIDURAL HEMATOMA;  Surgeon: Mavis Purchase, MD;  Location: [facility] OR;  Service: Neurosurgery;  Laterality: N/A;    Family History  Problem Relation Age of Onset  . Breast cancer Mother 74  . Heart attack Father 25  . Mental illness Sister   . Heart Problems Brother 72  . Cancer Brother   . Breast cancer Paternal Grandmother   . Colon cancer Paternal Aunt 63   Social History   Socioeconomic History  . Marital status: Married    Spouse name: Alm  . Number of children: Not on file  . Years of education: Not on file  . Highest education level: Not on file  Occupational History  . Occupation: Retired Pensions Consultant  Tobacco Use  . Smoking status: Never    Passive exposure: Past  . Smokeless tobacco: Never  Vaping Use  . Vaping status: Never Used  Substance and Sexual Activity  . Alcohol use: Not Currently  . Drug use: Never  . Sexual activity: Not Currently  Other Topics Concern  . Not on file  Social History Narrative  . Not on file   Social Drivers of Health   Financial Resource Strain: Low Risk  (02/02/2024)   Overall Financial Resource Strain (CARDIA)   . Difficulty of Paying Living Expenses: Not hard at all  Food Insecurity: No Food Insecurity (02/02/2024)   Hunger Vital Sign   . Worried About Programme Researcher, Broadcasting/film/video in the Last Year: Never true   . Ran Out of Food in the Last Year: Never true  Transportation Needs: No Transportation Needs (02/02/2024)   PRAPARE - Transportation   . Lack of Transportation (Medical): No   . Lack of Transportation (Non-Medical): No  Physical Activity: Inactive (02/02/2024)   Exercise Vital Sign   . Days of Exercise per Week: 0 days   . Minutes of Exercise per Session: 0 min  Stress: No Stress  Concern Present  (02/02/2024)   [employer] of Occupational Health - Occupational Stress Questionnaire   . Feeling of Stress: Not at all  Social Connections: Moderately Integrated (02/02/2024)   Social Connection and Isolation Panel   . Frequency of Communication with Friends and Family: Twice a week   . Frequency of Social Gatherings with Friends and Family: Twice a week   . Attends Religious Services: More than 4 times per year   . Active Member of Clubs or Organizations: No   . Attends Banker Meetings: Never   . Marital Status: Married    Objective:  BP (!) 162/88   Pulse 61   Temp 97.7 F (36.5 C)   Ht 5' 3 (1.6 m)   Wt 195 lb (88.5 kg)   SpO2 97%   BMI 34.54 kg/m      07/19/2024    3:15 PM 07/01/2024    1:41 PM 06/17/2024    9:38 AM  BP/Weight  Systolic BP 162 130 154  Diastolic BP 88 74 92  Wt. (Lbs) 195 194 194  BMI 34.54 kg/m2 34.37 kg/m2 34.37 kg/m2    Physical Exam Vitals and nursing note reviewed.  Constitutional:      Appearance: She is obese.  HENT:     Head: Normocephalic and atraumatic.  Cardiovascular:     Rate and Rhythm: Normal rate and regular rhythm.  Pulmonary:     Effort: Pulmonary effort is normal.     Breath sounds: Normal breath sounds.  Musculoskeletal:        General: No swelling. Normal range of motion.  Skin:    General: Skin is warm.  Neurological:     General: No focal deficit present.     Mental Status: She is alert and oriented to person, place, and time.  Psychiatric:        Mood and Affect: Mood normal.     {Perform Simple Foot Exam  Perform Detailed exam:1} {Insert foot Exam (Optional):30965}   Lab Results  Component Value Date   WBC 7.9 06/17/2024   HGB 13.6 06/17/2024   HCT 42.1 06/17/2024   PLT 311 06/17/2024   GLUCOSE 106 (H) 06/17/2024   CHOL 242 (H) 06/17/2024   TRIG 148 06/17/2024   HDL 46 06/17/2024   LDLCALC 169 (H) 06/17/2024   ALT 11 06/17/2024   AST 15 06/17/2024   NA 142 06/17/2024   K 5.5  (H) 06/17/2024   CL 101 06/17/2024   CREATININE 0.87 06/17/2024   BUN 13 06/17/2024   CO2 24 06/17/2024   TSH 1.550 06/17/2024   INR 0.9 08/04/2019   HGBA1C 6.5 (H) 06/17/2024    Results for orders placed or performed in visit on 06/17/24  CBC with Differential/Platelet   Collection Time: 06/17/24 12:15 PM  Result Value Ref Range   WBC 7.9 3.4 - 10.8 x10E3/uL   RBC 4.88 3.77 - 5.28 x10E6/uL   Hemoglobin 13.6 11.1 - 15.9 g/dL   Hematocrit 57.8 65.9 - 46.6 %   MCV 86 79 - 97 fL   MCH 27.9 26.6 - 33.0 pg   MCHC 32.3 31.5 - 35.7 g/dL   RDW 84.0 (H) 88.2 - 84.5 %   Platelets 311 150 - 450 x10E3/uL   Neutrophils 59 Not Estab. %   Lymphs 28 Not Estab. %   Monocytes 10 Not Estab. %   Eos 2 Not Estab. %   Basos 1 Not Estab. %   Neutrophils Absolute 4.6 1.4 - 7.0  x10E3/uL   Lymphocytes Absolute 2.3 0.7 - 3.1 x10E3/uL   Monocytes Absolute 0.8 0.1 - 0.9 x10E3/uL   EOS (ABSOLUTE) 0.2 0.0 - 0.4 x10E3/uL   Basophils Absolute 0.1 0.0 - 0.2 x10E3/uL   Immature Granulocytes 0 Not Estab. %   Immature Grans (Abs) 0.0 0.0 - 0.1 x10E3/uL  Comprehensive metabolic panel with GFR   Collection Time: 06/17/24 12:15 PM  Result Value Ref Range   Glucose 106 (H) 70 - 99 mg/dL   BUN 13 8 - 27 mg/dL   Creatinine, Ser 9.12 0.57 - 1.00 mg/dL   eGFR 69 >40 fO/fpw/8.26   BUN/Creatinine Ratio 15 12 - 28   Sodium 142 134 - 144 mmol/L   Potassium 5.5 (H) 3.5 - 5.2 mmol/L   Chloride 101 96 - 106 mmol/L   CO2 24 20 - 29 mmol/L   Calcium  9.8 8.7 - 10.3 mg/dL   Total Protein 6.9 6.0 - 8.5 g/dL   Albumin  4.4 3.8 - 4.8 g/dL   Globulin, Total 2.5 1.5 - 4.5 g/dL   Bilirubin Total 0.3 0.0 - 1.2 mg/dL   Alkaline Phosphatase 91 49 - 135 IU/L   AST 15 0 - 40 IU/L   ALT 11 0 - 32 IU/L  Hemoglobin A1c   Collection Time: 06/17/24 12:15 PM  Result Value Ref Range   Hgb A1c MFr Bld 6.5 (H) 4.8 - 5.6 %   Est. average glucose Bld gHb Est-mCnc 140 mg/dL  Lipid panel   Collection Time: 06/17/24 12:15 PM  Result  Value Ref Range   Cholesterol, Total 242 (H) 100 - 199 mg/dL   Triglycerides 851 0 - 149 mg/dL   HDL 46 >60 mg/dL   VLDL Cholesterol Cal 27 5 - 40 mg/dL   LDL Chol Calc (NIH) 830 (H) 0 - 99 mg/dL   Chol/HDL Ratio 5.3 (H) 0.0 - 4.4 ratio  TSH   Collection Time: 06/17/24 12:15 PM  Result Value Ref Range   TSH 1.550 0.450 - 4.500 uIU/mL  .  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  Ambulatory referral to Gastroenterology  Abnormal stool consistency  Orders: .  Ambulatory referral to Gastroenterology    Body mass index is 34.54 kg/m.       Meds ordered this encounter  Medications  . hydrochlorothiazide  (HYDRODIURIL ) 12.5 MG tablet    Sig: Take 1 tablet (12.5 mg total) by mouth daily.    Dispense:  90 tablet    Refill:  3    Orders Placed This Encounter  Procedures  . Ambulatory referral to Gastroenterology       Follow-up: No follow-ups on file.  An After Visit Summary was printed and given to the patient.  Shatonia Hoots, MD [facility] [phone]

## 2024-07-19 NOTE — Assessment & Plan Note (Signed)
 SABRA

## 2024-08-16 ENCOUNTER — Encounter: Payer: Self-pay | Admitting: Family Medicine

## 2024-08-16 ENCOUNTER — Ambulatory Visit: Admitting: Family Medicine

## 2024-08-16 VITALS — BP 122/68 | HR 80 | Temp 97.9°F | Ht 63.0 in | Wt 195.0 lb

## 2024-08-16 DIAGNOSIS — I700 Atherosclerosis of aorta: Secondary | ICD-10-CM

## 2024-08-16 DIAGNOSIS — E782 Mixed hyperlipidemia: Secondary | ICD-10-CM

## 2024-08-16 DIAGNOSIS — E875 Hyperkalemia: Secondary | ICD-10-CM | POA: Diagnosis not present

## 2024-08-16 DIAGNOSIS — K644 Residual hemorrhoidal skin tags: Secondary | ICD-10-CM | POA: Insufficient documentation

## 2024-08-16 DIAGNOSIS — E1142 Type 2 diabetes mellitus with diabetic polyneuropathy: Secondary | ICD-10-CM | POA: Diagnosis not present

## 2024-08-16 DIAGNOSIS — R197 Diarrhea, unspecified: Secondary | ICD-10-CM | POA: Diagnosis not present

## 2024-08-16 DIAGNOSIS — J439 Emphysema, unspecified: Secondary | ICD-10-CM

## 2024-08-16 MED ORDER — HYDROCORT-PRAMOXINE (PERIANAL) 1-1 % EX FOAM: 1.0000 | Freq: Two times a day (BID) | CUTANEOUS | 0 refills | Status: AC

## 2024-08-16 NOTE — Assessment & Plan Note (Signed)
 Prescription: proctofoam sent.

## 2024-08-16 NOTE — Progress Notes (Signed)
 "  Subjective:  Patient ID: Caitlin [name], female    DOB: 12-23-1945  Age: 78 y.o. MRN: [id number]  Chief Complaint  Patient presents with   Medical Management of Chronic Issues    HPI: Discussed the use of AI scribe software for clinical note transcription with the patient, who gave verbal consent to proceed.  History of Present Illness Caitlin [name] is a 78 year old female with hypertension and diabetes who presents for follow-up of her blood pressure and cholesterol management.  Hypertension - Previously had severely elevated blood pressure, reaching 247 mmHg, resulting in an emergency room visit. - Currently taking amlodipine 5 mg in the morning, hydrochlorothiazide  12.5 mg in the morning, and irbesartan 150 mg at night for blood pressure control.  Hyperlipidemia and statin intolerance - History of elevated cholesterol. - Unable to obtain insurance coverage for Repatha . - Experienced muscle aches with atorvastatin  and rosuvastatin . - Considering returning to a previous cholesterol medication, possibly simvastatin, but has experienced side effects with statins in the past.  Diabetes mellitus - Previously used Ozempic , discontinued due to suspected constipation. - Currently on Tresiba  insulin , 56 units once daily. - Blood glucose readings are well controlled, averaging around 100 mg/dL. - Hemoglobin A1c improved from 7.8% to 6.5%. - Weight loss from 210 lbs in June to 194 lbs in October, now stable. - Uses Freestyle glucose monitor but plans to switch due to product issues.  Diarrhea and gastrointestinal symptoms - Diarrhea present for three weeks, worsening over the last four days. - Loose bowel movements, not currently taking Linzess  for constipation. - Hemorrhoid irritation secondary to diarrhea. - No fever, chills, or other systemic symptoms. - Night sweats possibly related to weakness and dehydration.  Medication use for comorbid conditions - Breztri  for respiratory  issues. - Gabapentin  for neuropathy. - Lexapro for depression and anxiety. - Xarelto as anticoagulation therapy. - Omeprazole  for acid reflux. - Occasional tramadol  for pain. - B12 supplements and rare use of calcium  carbonate.       06/17/2024    9:40 AM 09/24/2023    8:54 AM 06/11/2023    8:09 AM 02/24/2023    7:35 AM 10/22/2022   11:03 AM  Depression screen PHQ 2/9  Decreased Interest 0 1 1 0 0  Down, Depressed, Hopeless 0 1 2 0 0  PHQ - 2 Score 0 2 3 0 0  Altered sleeping 0 1 2 0 1  Tired, decreased energy 0 1 2 0 1  Change in appetite 0 1 2 0 0  Feeling bad or failure about yourself  0 0 0 0 0  Trouble concentrating 0 0 1 0 0  Moving slowly or fidgety/restless 0 0 1 0 0  Suicidal thoughts 0 0 0 0 0  PHQ-9 Score 0  5  11  0  2   Difficult doing work/chores Not difficult at all Somewhat difficult Somewhat difficult Not difficult at all Not difficult at all     Data saved with a previous flowsheet row definition        06/17/2024    9:41 AM  Fall Risk   Falls in the past year? 1  Number falls in past yr: 0  Injury with Fall? 0   Risk for fall due to : Other (Comment)  Risk for fall due to: Comment Bp elevated  Follow up Falls evaluation completed     Data saved with a previous flowsheet row definition    Patient Care Team: Sherre Clapper, MD as PCP -  General (Family Medicine) Braulio Hough, MD as Referring Physician (Internal Medicine) Onita Duos, MD as Consulting Physician (Neurology) Monetta Redell PARAS, MD as Consulting Physician (Cardiology) Steen Evalene CROME., MD (Urology) Joshua Alm Hamilton, MD as Consulting Physician (Neurosurgery) Myrick Elspeth PARAS., DPM (Podiatry)   Review of Systems  Constitutional:  Negative for chills, fatigue and fever.  HENT:  Negative for congestion, ear pain and sore throat.   Respiratory:  Negative for cough and shortness of breath.   Cardiovascular:  Negative for chest pain.  Gastrointestinal:  Negative for abdominal pain,  constipation, diarrhea, nausea and vomiting.  Genitourinary:  Negative for dysuria and urgency.  Skin:  Negative for rash.  Neurological:  Negative for dizziness and headaches.    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 Small internal hemorrhoids. Otherwise normal colonosopy.    LUMBAR WOUND DEBRIDEMENT N/A 08/09/2019   Procedure: LUMBAR WOUND DEBRIDEMENT Evacuation of  EPIDURAL HEMATOMA;  Surgeon: Mavis Purchase, MD;  Location: [facility] OR;  Service: Neurosurgery;  Laterality: N/A;    Family History  Problem Relation Age of Onset   Breast cancer Mother 78   Heart attack Father 91   Mental illness Sister    Heart Problems Brother 58   Cancer Brother    Breast cancer Paternal Grandmother    Colon cancer Paternal Aunt 32   Social  History   Socioeconomic History   Marital status: Married    Spouse name: Alm   Number of children: Not on file   Years of education: Not on file   Highest education level: Not on file  Occupational History   Occupation: Retired Pensions Consultant  Tobacco Use   Smoking status: Never    Passive exposure: Past   Smokeless tobacco: Never  Vaping Use   Vaping status: Never Used  Substance and Sexual Activity   Alcohol use: Not Currently   Drug use: Never   Sexual activity: Not Currently  Other Topics Concern   Not on file  Social History Narrative   Not on file   Social Drivers of Health   Tobacco Use: Low Risk (08/16/2024)   Patient History    Smoking Tobacco Use: Never    Smokeless Tobacco Use: Never    Passive Exposure: Past  Financial  Resource Strain: Low Risk (02/02/2024)   Overall Financial Resource Strain (CARDIA)    Difficulty of Paying Living Expenses: Not hard at all  Food Insecurity: No Food Insecurity (02/02/2024)   Epic    Worried About Programme Researcher, Broadcasting/film/video in the Last Year: Never true    Ran Out of Food in the Last Year: Never true  Transportation Needs: No Transportation Needs (02/02/2024)   Epic    Lack of Transportation (Medical): No    Lack of Transportation (Non-Medical): No  Physical Activity: Inactive (02/02/2024)   Exercise Vital Sign    Days of Exercise per Week: 0 days    Minutes of Exercise per Session: 0 min  Stress: No Stress Concern Present (02/02/2024)   [employer] of Occupational Health - Occupational Stress Questionnaire    Feeling of Stress: Not at all  Social Connections: Moderately Integrated (02/02/2024)   Social Connection and Isolation Panel    Frequency of Communication with Friends and Family: Twice a week    Frequency of Social Gatherings with Friends and Family: Twice a week    Attends Religious Services: More than 4 times per year    Active Member of Clubs or Organizations: No    Attends Banker Meetings:  Never    Marital Status: Married  Depression (PHQ2-9): Low Risk (06/17/2024)   Depression (PHQ2-9)    PHQ-2 Score: 0  Alcohol Screen: Low Risk (02/02/2024)   Alcohol Screen    Last Alcohol Screening Score (AUDIT): 0  Housing: Low Risk (02/02/2024)   Epic    Unable to Pay for Housing in the Last Year: No    Number of Times Moved in the Last Year: 0    Homeless in the Last Year: No  Utilities: Not At Risk (02/02/2024)   Epic    Threatened with loss of utilities: No  Health Literacy: Adequate Health Literacy (02/02/2024)   B1300 Health Literacy    Frequency of need for help with medical instructions: Never    Objective:  BP 122/68   Pulse 80   Temp 97.9 F (36.6 C)   Ht 5' 3 (1.6 m)   Wt 195 lb (88.5 kg)   SpO2 97%   BMI 34.54 kg/m      08/16/2024    3:03 PM 07/19/2024    4:01 PM 07/19/2024    3:15 PM  BP/Weight  Systolic BP 122 156 162  Diastolic BP 68 98 88  Wt. (Lbs) 195  195  BMI 34.54 kg/m2  34.54 kg/m2    Physical Exam Vitals reviewed.  Constitutional:      Appearance: Normal appearance. She is obese.  Neck:     Vascular: No carotid bruit.  Cardiovascular:     Rate and Rhythm: Normal rate and regular rhythm.     Heart sounds: Normal heart sounds.  Pulmonary:     Effort: Pulmonary effort is normal. No respiratory distress.     Breath sounds: Normal breath sounds.  Abdominal:     General: Abdomen is flat. Bowel sounds are normal.     Palpations: Abdomen is soft.     Tenderness: There is no abdominal tenderness.  Neurological:     Mental Status: She is alert and oriented to person, place, and time.  Psychiatric:        Mood and Affect: Mood normal.        Behavior: Behavior normal.         Lab Results  Component Value Date   WBC  7.9 06/17/2024   HGB 13.6 06/17/2024   HCT 42.1 06/17/2024   PLT 311 06/17/2024   GLUCOSE 106 (H) 06/17/2024   CHOL 242 (H) 06/17/2024   TRIG 148 06/17/2024   HDL 46 06/17/2024   LDLCALC 169 (H) 06/17/2024   ALT 11  06/17/2024   AST 15 06/17/2024   NA 142 06/17/2024   K 5.5 (H) 06/17/2024   CL 101 06/17/2024   CREATININE 0.87 06/17/2024   BUN 13 06/17/2024   CO2 24 06/17/2024   TSH 1.550 06/17/2024   INR 0.9 08/04/2019   HGBA1C 6.5 (H) 06/17/2024    Results for orders placed or performed in visit on 06/17/24  CBC with Differential/Platelet   Collection Time: 06/17/24 12:15 PM  Result Value Ref Range   WBC 7.9 3.4 - 10.8 x10E3/uL   RBC 4.88 3.77 - 5.28 x10E6/uL   Hemoglobin 13.6 11.1 - 15.9 g/dL   Hematocrit 57.8 65.9 - 46.6 %   MCV 86 79 - 97 fL   MCH 27.9 26.6 - 33.0 pg   MCHC 32.3 31.5 - 35.7 g/dL   RDW 84.0 (H) 88.2 - 84.5 %   Platelets 311 150 - 450 x10E3/uL   Neutrophils 59 Not Estab. %   Lymphs 28 Not Estab. %   Monocytes 10 Not Estab. %   Eos 2 Not Estab. %   Basos 1 Not Estab. %   Neutrophils Absolute 4.6 1.4 - 7.0 x10E3/uL   Lymphocytes Absolute 2.3 0.7 - 3.1 x10E3/uL   Monocytes Absolute 0.8 0.1 - 0.9 x10E3/uL   EOS (ABSOLUTE) 0.2 0.0 - 0.4 x10E3/uL   Basophils Absolute 0.1 0.0 - 0.2 x10E3/uL   Immature Granulocytes 0 Not Estab. %   Immature Grans (Abs) 0.0 0.0 - 0.1 x10E3/uL  Comprehensive metabolic panel with GFR   Collection Time: 06/17/24 12:15 PM  Result Value Ref Range   Glucose 106 (H) 70 - 99 mg/dL   BUN 13 8 - 27 mg/dL   Creatinine, Ser 9.12 0.57 - 1.00 mg/dL   eGFR 69 >40 fO/fpw/8.26   BUN/Creatinine Ratio 15 12 - 28   Sodium 142 134 - 144 mmol/L   Potassium 5.5 (H) 3.5 - 5.2 mmol/L   Chloride 101 96 - 106 mmol/L   CO2 24 20 - 29 mmol/L   Calcium  9.8 8.7 - 10.3 mg/dL   Total Protein 6.9 6.0 - 8.5 g/dL   Albumin  4.4 3.8 - 4.8 g/dL   Globulin, Total 2.5 1.5 - 4.5 g/dL   Bilirubin Total 0.3 0.0 - 1.2 mg/dL   Alkaline Phosphatase 91 49 - 135 IU/L   AST 15 0 - 40 IU/L   ALT 11 0 - 32 IU/L  Hemoglobin A1c   Collection Time: 06/17/24 12:15 PM  Result Value Ref Range   Hgb A1c MFr Bld 6.5 (H) 4.8 - 5.6 %   Est. average glucose Bld gHb Est-mCnc 140 mg/dL   Lipid panel   Collection Time: 06/17/24 12:15 PM  Result Value Ref Range   Cholesterol, Total 242 (H) 100 - 199 mg/dL   Triglycerides 851 0 - 149 mg/dL   HDL 46 >60 mg/dL   VLDL Cholesterol Cal 27 5 - 40 mg/dL   LDL Chol Calc (NIH) 830 (H) 0 - 99 mg/dL   Chol/HDL Ratio 5.3 (H) 0.0 - 4.4 ratio  TSH   Collection Time: 06/17/24 12:15 PM  Result Value Ref Range   TSH 1.550 0.450 - 4.500 uIU/mL  .  Assessment & Plan:  Assessment & Plan Diarrhea of presumed infectious origin Diarrhea, possible infectious etiology Diarrhea for three weeks, worsening recently. Possible infectious cause considered. - Ordered stool studies to check for infection. Orders:   Stool culture   Clostridium Difficile by PCR   Ova and parasite examination   Fecal leukocytes  Hyperkalemia CHECK LEVEL.  Orders:   Comprehensive metabolic panel with GFR  Diabetic peripheral neuropathy associated with type 2 diabetes mellitus (HCC) Diabetes well-controlled with Tresiba . Hemoglobin A1c improved. Neuropathy managed with gabapentin . - Continue Tresiba  insulin  56 units daily. - Continue gabapentin  600 mg three times daily for neuropathy. - history of intolerance to ozempic  and mounjaro .     Chronic obstructive pulmonary disease with emphysema, unspecified emphysema type (HCC) Stable.  - Breztri  for maintenance - albuterol  rescue inhaler      External hemorrhoids Prescription: proctofoam sent.     Mixed hyperlipidemia Not at goal.  Aortic atherosclerosis. Intolerance to atorvastatin  and rosuvastatin . Repatha  not approved per patient through her cardiologist. Considering Leqvio infusion. - Attempt to get Leqvio infusion approved through the infusion center.    Aortic atherosclerosis Order leqvio      Body mass index is 34.54 kg/m.     Meds ordered this encounter  Medications   hydrocortisone -pramoxine (PROCTOFOAM-HC) rectal foam    Sig: Place 1 applicator rectally 2 (two) times daily.     Dispense:  10 g    Refill:  0    Orders Placed This Encounter  Procedures   Stool culture   Clostridium Difficile by PCR   Ova and parasite examination   Fecal leukocytes   Comprehensive metabolic panel with GFR       Follow-up: Return in about 6 weeks (around 09/27/2024) for chronic follow up.  An After Visit Summary was printed and given to the patient.  Abigail Free, MD [facility] [phone]    [1]  Current Outpatient Medications on File Prior to Visit  Medication Sig Dispense Refill   albuterol  (PROVENTIL ) (2.5 MG/3ML) 0.083% nebulizer solution Take 3 mLs (2.5 mg total) by nebulization every 6 (six) hours as needed for wheezing or shortness of breath. 75 mL 0   amLODipine (NORVASC) 5 MG tablet Take 5 mg by mouth daily.     ammonium lactate  (AMLACTIN) 12 % cream Apply 1 Application topically as needed for dry skin. 385 g 0   BD PEN NEEDLE NANO 2ND GEN 32G X 4 MM MISC 1 EACH BY DOES NOT APPLY ROUTE IN THE MORNING AND AT BEDTIME. 100 each 2   Biotin 89999 MCG TABS Take by mouth.     Budeson-Glycopyrrol-Formoterol  (BREZTRI  AEROSPHERE) 160-9-4.8 MCG/ACT AERO Inhale 2 puffs into the lungs in the morning and at bedtime. 10.7 g 3   calcium  carbonate (TUMS - DOSED IN MG ELEMENTAL CALCIUM ) 500 MG chewable tablet Chew 2 tablets by mouth daily as needed for indigestion.      Continuous Glucose Receiver (FREESTYLE LIBRE 3 READER) DEVI Apply 1 each topically daily.     cyanocobalamin (VITAMIN B12) 1000 MCG tablet Take 1,000 mcg by mouth daily.     escitalopram (LEXAPRO) 10 MG tablet Take 10 mg by mouth daily.     gabapentin  (NEURONTIN ) 600 MG tablet Take 1 tablet (600 mg total) by mouth 3 (three) times daily. 90 tablet 3   hydrochlorothiazide  (HYDRODIURIL ) 12.5 MG tablet Take 1 tablet (12.5 mg total) by mouth daily. 90 tablet 3   insulin  degludec (TRESIBA  FLEXTOUCH) 200 UNIT/ML FlexTouch Pen Inject 56 Units into the skin  every evening. 27 mL 0   Insulin  Pen Needle 31G X 8 MM  MISC Inject 1 Units into the skin daily.     irbesartan (AVAPRO) 150 MG tablet Take 150 mg by mouth daily.     mupirocin  ointment (BACTROBAN ) 2 % Apply 1 Application topically 2 (two) times daily. 30 g 2   nystatin cream (MYCOSTATIN) Apply 1 Application topically 2 (two) times daily.     omeprazole  (PRILOSEC) 40 MG capsule TAKE 1 CAPSULE (40 MG TOTAL) BY MOUTH IN THE MORNING AND AT BEDTIME. 180 capsule 1   ondansetron  (ZOFRAN ) 4 MG tablet Take 1 tablet (4 mg total) by mouth every 8 (eight) hours as needed for nausea or vomiting. 20 tablet 0   rivaroxaban (XARELTO) 20 MG TABS tablet Take by mouth at bedtime.     triamcinolone  cream (KENALOG ) 0.1 % Apply topically 2 (two) times daily.     No current facility-administered medications on file prior to visit.   "

## 2024-08-16 NOTE — Assessment & Plan Note (Signed)
 Order leqvio

## 2024-08-16 NOTE — Assessment & Plan Note (Signed)
 Diabetes well-controlled with Tresiba . Hemoglobin A1c improved. Neuropathy managed with gabapentin . - Continue Tresiba  insulin  56 units daily. - Continue gabapentin  600 mg three times daily for neuropathy. - history of intolerance to ozempic  and mounjaro .

## 2024-08-16 NOTE — Assessment & Plan Note (Signed)
 Not at goal.  Aortic atherosclerosis. Intolerance to atorvastatin  and rosuvastatin . Repatha  not approved per patient through her cardiologist. Considering Leqvio infusion. - Attempt to get Leqvio infusion approved through the infusion center.

## 2024-08-16 NOTE — Assessment & Plan Note (Signed)
 Stable.  - Breztri  for maintenance - albuterol  rescue inhaler

## 2024-08-16 NOTE — Patient Instructions (Signed)
" °  VISIT SUMMARY: Today, we reviewed your blood pressure, cholesterol, and diabetes management. We also addressed your recent diarrhea and gastrointestinal symptoms, as well as your ongoing treatment for other conditions.  YOUR PLAN: DIARRHEA: You have had diarrhea for three weeks, which has worsened recently. We are considering a possible infection. -We ordered stool studies to check for an infection.  TYPE 2 DIABETES MELLITUS WITH DIABETIC POLYNEUROPATHY: Your diabetes is well-controlled with Tresiba  insulin , and your hemoglobin A1c has improved. Your neuropathy is managed with gabapentin . -Continue taking Tresiba  insulin  56 units daily. -Continue taking gabapentin  600 mg three times daily for neuropathy.  HYPERLIPIDEMIA WITH STATIN INTOLERANCE: You have a history of high cholesterol and intolerance to certain statins. Repatha  was not approved, and we are considering Leqvio infusion. -We are attempting to get Leqvio infusion approved through the infusion center.  HYPERTENSION: Your blood pressure is being managed with your current medications. -Continue your current antihypertensive medications: amlodipine 5 mg in the morning, hydrochlorothiazide  12.5 mg in the morning, and irbesartan 150 mg at night.  CHRONIC OBSTRUCTIVE PULMONARY DISEASE WITH EMPHYSEMA: Your COPD is managed with the Breztri  inhaler. -Continue using the Breztri  inhaler, two puffs twice daily.  EXTERNAL HEMORRHOIDS: Your hemorrhoids are causing discomfort, likely due to diarrhea. -Use Proctofoam as prescribed for hemorrhoid management.  HYPERKALEMIA: You have had high potassium levels in the past, so we need to monitor this. -We ordered blood work to check your potassium levels.  GASTROESOPHAGEAL REFLUX DISEASE: Your acid reflux is managed with omeprazole . -Continue taking omeprazole  40 mg twice daily.  DEPRESSION AND ANXIETY: Your depression and anxiety are managed with escitalopram. -Continue taking escitalopram 10  mg daily.                      Contains text generated by [employer].                                 Contains text generated by [employer].   "

## 2024-08-17 ENCOUNTER — Other Ambulatory Visit (HOSPITAL_COMMUNITY): Payer: Self-pay | Admitting: Family Medicine

## 2024-08-17 LAB — COMPREHENSIVE METABOLIC PANEL WITH GFR
ALT: 14 IU/L (ref 0–32)
AST: 16 IU/L (ref 0–40)
Albumin: 4.5 g/dL (ref 3.8–4.8)
Alkaline Phosphatase: 80 IU/L (ref 49–135)
BUN/Creatinine Ratio: 19 (ref 12–28)
BUN: 14 mg/dL (ref 8–27)
Bilirubin Total: <0.2 mg/dL (ref 0.0–1.2)
CO2: 25 mmol/L (ref 20–29)
Calcium: 9.6 mg/dL (ref 8.7–10.3)
Chloride: 104 mmol/L (ref 96–106)
Creatinine, Ser: 0.74 mg/dL (ref 0.57–1.00)
Globulin, Total: 2.3 g/dL (ref 1.5–4.5)
Glucose: 79 mg/dL (ref 70–99)
Potassium: 5.2 mmol/L (ref 3.5–5.2)
Sodium: 142 mmol/L (ref 134–144)
Total Protein: 6.8 g/dL (ref 6.0–8.5)
eGFR: 83 mL/min/1.73

## 2024-08-19 LAB — CLOSTRIDIUM DIFFICILE BY PCR: Toxigenic C. Difficile by PCR: NEGATIVE

## 2024-08-22 ENCOUNTER — Ambulatory Visit: Payer: Self-pay | Admitting: Family Medicine

## 2024-08-23 LAB — FECAL LEUKOCYTES

## 2024-08-23 LAB — STOOL CULTURE: E coli, Shiga toxin Assay: NEGATIVE

## 2024-08-23 LAB — OVA AND PARASITE EXAMINATION

## 2024-08-26 NOTE — Telephone Encounter (Signed)
 Copied from CRM #[id number]. Topic: Clinical - Lab/Test Results >> Aug 26, 2024 11:10 AM Donna BRAVO wrote: Reason for CRM: Patient wanting lab results  Read Note Verbatim:  Chemistry panel (liver and kidney function) normal.  C diff negative.  Stool cultures negative.    If you have any questions, please message via my chart or call our office.   Thank you,   Abigail Free, MD [facility] [phone]    Patient has many questions and would like to speak with a nurse.  Patient was informed they will receive a call back by end of business day

## 2024-09-06 ENCOUNTER — Encounter: Payer: Self-pay | Admitting: Physician Assistant

## 2024-09-06 ENCOUNTER — Ambulatory Visit: Admitting: Physician Assistant

## 2024-09-06 VITALS — BP 122/72 | HR 70 | Ht 63.0 in | Wt 198.0 lb

## 2024-09-06 DIAGNOSIS — Z7901 Long term (current) use of anticoagulants: Secondary | ICD-10-CM

## 2024-09-06 DIAGNOSIS — R1032 Left lower quadrant pain: Secondary | ICD-10-CM

## 2024-09-06 DIAGNOSIS — K5904 Chronic idiopathic constipation: Secondary | ICD-10-CM

## 2024-09-06 DIAGNOSIS — K219 Gastro-esophageal reflux disease without esophagitis: Secondary | ICD-10-CM | POA: Diagnosis not present

## 2024-09-06 DIAGNOSIS — R1030 Lower abdominal pain, unspecified: Secondary | ICD-10-CM

## 2024-09-06 DIAGNOSIS — R194 Change in bowel habit: Secondary | ICD-10-CM | POA: Diagnosis not present

## 2024-09-06 DIAGNOSIS — I4891 Unspecified atrial fibrillation: Secondary | ICD-10-CM | POA: Diagnosis not present

## 2024-09-06 DIAGNOSIS — R109 Unspecified abdominal pain: Secondary | ICD-10-CM

## 2024-09-06 NOTE — Patient Instructions (Addendum)
 You will be contacted by [facility] in the next 2 days to arrange a CT Abdomen/Pelvis.  The number on your caller ID will be [phone], please answer when they call.  If you have not heard from them in 2 days please call [phone] to schedule.     First do a trial off milk/lactose products if you use them.  Add fiber like benefiber or citracel once a day  Can do trial of probiotic for 2-4 weeks of allign  Can do trial of IBGard which is over the counter for AB pain- Take 1-2 capsules once a day for maintence or twice a day during a flare  FIBER SUPPLEMENT You can do metamucil or fibercon once or twice a day but if this causes gas/bloating please switch to Benefiber or Citracel.  Fiber is good for constipation/diarrhea/irritable bowel syndrome.  It can also help with weight loss and can help lower your bad cholesterol (LDL).  Please do 1 TBSP in the morning in water, coffee, or tea.  It can take up to a month before you can see a difference with your bowel movements.  It is cheapest from [employer], [employer], [employer].     FODMAP stands for fermentable oligo-, di-, mono-saccharides and polyols (1). These are the scientific terms used to classify groups of carbs that are difficult for our body to digest and that are notorious for triggering digestive symptoms like bloating, gas, loose stools and stomach pain.   You can try low FODMAP diet  - start with eliminating just one column at a time that you feel may be a trigger for you. - the table at the very bottom contains foods that are low in FODMAPs   Sometimes trying to eliminate the FODMAP's from your diet is difficult or tricky, if you are stuggling with trying to do the elimination diet you can try an enzyme.  There is a food enzymes that you sprinkle in or on your food that helps break down the FODMAP. You can read more about the enzyme by going to this site: https://fodzyme.com/  Here some information about pelvic  floor dysfunction. This may be contributing to some of your symptoms. We will continue with our evaluation but I do want you to consider adding on fiber supplement with low-dose MiraLAX daily. We could also refer to pelvic floor physical therapy.   Pelvic Floor Dysfunction, Female Pelvic floor dysfunction (PFD) is a condition that results when the group of muscles and connective tissues that support the organs in the pelvis (pelvic floor muscles) do not work well. These muscles and their connections form a sling that supports the colon and bladder. In women, they also support the uterus. PFD causes pelvic floor muscles to be too weak, too tight, or both. In PFD, muscle movements are not coordinated. This may cause bowel or bladder problems. It may also cause pain. What are the causes? This condition may be caused by an injury to the pelvic area or by a weakening of pelvic muscles. This often results from pregnancy and childbirth or other types of strain. In many cases, the exact cause is not known. What increases the risk? The following factors may make you more likely to develop this condition: Having chronic bladder tissue inflammation (interstitial cystitis). Being an older person. Being overweight. History of radiation treatment for cancer in the pelvic region. Previous pelvic surgery, such as removal of the uterus (hysterectomy). What are the signs or symptoms? Symptoms of this condition vary  and may include: Bladder symptoms, such as: Trouble starting urination and emptying the bladder. Frequent urinary tract infections. Leaking urine when coughing, laughing, or exercising (stress incontinence). Having to pass urine urgently or frequently. Pain when passing urine. Bowel symptoms, such as: Constipation. Urgent or frequent bowel movements. Incomplete bowel movements. Painful bowel movements. Leaking stool or gas. Unexplained genital or rectal pain. Genital or rectal muscle  spasms. Low back pain. Other symptoms may include: A heavy, full, or aching feeling in the vagina. A bulge that protrudes into the vagina. Pain during or after sex. How is this diagnosed? This condition may be diagnosed based on: Your symptoms and medical history. A physical exam. During the exam, your health care provider may check your pelvic muscles for tightness, spasm, pain, or weakness. This may include a rectal exam and a pelvic exam. In some cases, you may have diagnostic tests, such as: Electrical muscle function tests. Urine flow testing. X-ray tests of bowel function. Ultrasound of the pelvic organs. How is this treated? Treatment for this condition depends on the symptoms. Treatment options include: Physical therapy. This may include Kegel exercises to help relax or strengthen the pelvic floor muscles. Biofeedback. This type of therapy provides feedback on how tight your pelvic floor muscles are so that you can learn to control them. Internal or external massage therapy. A treatment that involves electrical stimulation of the pelvic floor muscles to help control pain (transcutaneous electrical nerve stimulation, or TENS). Sound wave therapy (ultrasound) to reduce muscle spasms. Medicines, such as: Muscle relaxants. Bladder control medicines. Surgery to reconstruct or support pelvic floor muscles may be an option if other treatments do not help. Follow these instructions at home: Activity Do your usual activities as told by your health care provider. Ask your health care provider if you should modify any activities. Do pelvic floor strengthening or relaxing exercises at home as told by your physical therapist. Lifestyle Maintain a healthy weight. Eat foods that are high in fiber, such as beans, whole grains, and fresh fruits and vegetables. Limit foods that are high in fat and processed sugars, such as fried or sweet foods. Manage stress with relaxation techniques such as  yoga or meditation. General instructions If you have problems with leakage: Use absorbable pads or wear padded underwear. Wash frequently with mild soap. Keep your genital and anal area as clean and dry as possible. Ask your health care provider if you should try a barrier cream to prevent skin irritation. Take warm baths to relieve pelvic muscle tension or spasms. Take over-the-counter and prescription medicines only as told by your health care provider. Keep all follow-up visits. How is this prevented? The cause of PFD is not always known, but there are a few things you can do to reduce the risk of developing this condition, including: Staying at a healthy weight. Getting regular exercise. Managing stress. Contact a health care provider if: Your symptoms are not improving with home care. You have signs or symptoms of PFD that get worse at home. You develop new signs or symptoms. You have signs of a urinary tract infection, such as: Fever. Chills. Increased urinary frequency. A burning feeling when urinating. You have not had a bowel movement in 3 days (constipation). Summary Pelvic floor dysfunction results when the muscles and connective tissues in your pelvic floor do not work well. These muscles and their connections form a sling that supports your colon and bladder. In women, they also support the uterus. PFD may be caused by  an injury to the pelvic area or by a weakening of pelvic muscles. PFD causes pelvic floor muscles to be too weak, too tight, or a combination of both. Symptoms may vary from person to person. In most cases, PFD can be treated with physical therapies and medicines. Surgery may be an option if other treatments do not help. This information is not intended to replace advice given to you by your health care provider. Make sure you discuss any questions you have with your health care provider. Document Revised: 12/13/2020 Document Reviewed: 12/13/2020 Elsevier  Patient Education  2022 [employer].  Due to recent changes in healthcare laws, you may see the results of your imaging and laboratory studies on MyChart before your provider has had a chance to review them.  We understand that in some cases there may be results that are confusing or concerning to you. Not all laboratory results come back in the same time frame and the provider may be waiting for multiple results in order to interpret others.  Please give us  48 hours in order for your provider to thoroughly review all the results before contacting the office for clarification of your results.    I appreciate the  opportunity to care for you  Thank You   [facility]

## 2024-09-06 NOTE — Progress Notes (Signed)
 "    09/06/2024 Caitlin [name] [id number] [birth year]  Referring provider: Sherre Clapper, MD Primary GI doctor: Dr. Charlanne  ASSESSMENT AND PLAN:  Change in bowel movements, constipation to diarrhea/soft stools, lower AB pain, normal stools studies Worsened over the last year, worse since ozempic  but stopped 9 months ago, can have BM 4-7 x a day, rare days without a BM 06/17/2024 KUB moderate stool, normal thyroid , no anemia 08/18/2024 negative stool studies for C. difficile stool culture fecal leukocytes O&P Possibly from medication changes, pelvic floor dysfunction, IBS, rule out other - PLAN: - with AB pain and changes in bowel habits, get CT AB with contrast -Can do trial of IBGARD daily -Add on citracel/benefiber, FODMAP, trial off lactulose and lifestyle changes discussed - possible pelvic floor component, consider referral to PT, information given -Consider SIBO testing or xifaxin trial pending results  GERD On prilosec 40 mg twice a day well controlled Denies nausea, melena, vomiting  CCS  10/15/2018 colonoscopy for rectal bleeding good bowel prep diverticula sigmoid descending ascending colon, nonbleeding internal hemorrhoids moderate normal TI No recall due to age  A-fib On xarelto  Diverticulosis Will call if any symptoms. Add on fiber supplement, avoid NSAIDS, information given  Type 2 diabetes insulin  dependent  Patient Care Team: Sherre Clapper, MD as PCP - General (Family Medicine) Braulio Hough, MD as Referring Physician (Internal Medicine) Onita Duos, MD as Consulting Physician (Neurology) Monetta Redell PARAS, MD as Consulting Physician (Cardiology) Steen Evalene CROME., MD (Urology) Joshua Alm Hamilton, MD as Consulting Physician (Neurosurgery) Myrick Elspeth PARAS., DPM (Podiatry)  HISTORY OF PRESENT ILLNESS: 79 y.o. female with a past medical history listed below presents for evaluation of change in bowel habits.   Last seen by Dr. Charlanne 10/08/2018 for rectal  bleeding  Discussed the use of AI scribe software for clinical note transcription with the patient, who gave verbal consent to proceed.  History of Present Illness   Caitlin [name] is a 79 year old female with diverticulosis, hemorrhoids, atrial fibrillation, and diabetes who presents for evaluation of chronic changes in bowel habits and lower abdominal discomfort.  Over the past year, bowel habits have changed significantly. Previously regular and formed, stools became severely constipated while taking Ozempic , with hard stools that were difficult to pass. After discontinuing Ozempic  nine months ago and a brief course of Linzess , constipation improved but was replaced by persistent diarrhea and mushy, poorly formed stools. Normal bowel movements have not occurred in about a year. Stool consistency is described as 'peanut-shaped' pieces, mush, or diarrhea, occurring four to seven times daily, often with a sensation of incomplete evacuation and the need to return to the toilet immediately. Cleaning has become more difficult due to stool consistency. Stool odor is different but not oily.  Lower abdominal discomfort is present, described as pain across the lower abdomen, sometimes severe but not requiring hospital care. Pain is present at the time of the visit and does not improve after bowel movements. She describes being very uncomfortable.  Hemorrhoids have worsened with diarrhea, causing significant pain and difficulty with hygiene. Topical agents have provided some improvement. No rectal bleeding or new rectal pain reported.  Colonoscopy in February 2020 for rectal bleeding showed diverticulosis and hemorrhoids. Abdominal x-ray was performed in October 2025. Recent stool studies, thyroid  labs, and anemia workup were reportedly normal.  No significant weight loss or changes in appetite, though she lost five pounds and regained it over the holidays. No nausea or vomiting. Episodes of hot flashes and  sweating have occurred over the past couple of months, similar to menopausal symptoms. No fevers or chills. Urinary leakage is present without urinary tract infection.  Current medications include Xarelto, amlodipine, irbesartan, hydrochlorothiazide  as needed, escitalopram, gabapentin , and omeprazole . Ozempic  was discontinued nine months ago and Linzess  is not currently used. She eats bran for dietary fiber and rarely consumes dairy products except for butter and occasional cheese or ice cream.  She has not had a hysterectomy and retains her uterus and ovaries.        She  reports that she has never smoked. She has been exposed to tobacco smoke. She has never used smokeless tobacco. She reports that she does not currently use alcohol. She reports that she does not use drugs.  RELEVANT GI HISTORY, IMAGING AND LABS: Results   Labs Stool studies: Negative Thyroid  function: Within normal limits Hemoglobin: No anemia  Radiology Abdominal X-ray (05/2024): Constipation  Diagnostic Colonoscopy (09/2018): Diverticulosis and hemorrhoids; otherwise normal      CBC    Component Value Date/Time   WBC 7.9 06/17/2024 1215   WBC 10.0 08/10/2019 0736   RBC 4.88 06/17/2024 1215   RBC 3.25 (L) 08/10/2019 0736   HGB 13.6 06/17/2024 1215   HCT 42.1 06/17/2024 1215   PLT 311 06/17/2024 1215   MCV 86 06/17/2024 1215   MCH 27.9 06/17/2024 1215   MCH 28.9 08/10/2019 0736   MCHC 32.3 06/17/2024 1215   MCHC 33.1 08/10/2019 0736   RDW 15.9 (H) 06/17/2024 1215   LYMPHSABS 2.3 06/17/2024 1215   MONOABS 0.6 08/04/2019 0955   EOSABS 0.2 06/17/2024 1215   BASOSABS 0.1 06/17/2024 1215   Recent Labs    09/24/23 1009 11/05/23 1516 06/17/24 1215  HGB 13.6 14.0 13.6    CMP     Component Value Date/Time   NA 142 08/16/2024 1610   K 5.2 08/16/2024 1610   CL 104 08/16/2024 1610   CO2 25 08/16/2024 1610   GLUCOSE 79 08/16/2024 1610   GLUCOSE 120 (H) 08/10/2019 0736   BUN 14 08/16/2024 1610    CREATININE 0.74 08/16/2024 1610   CALCIUM  9.6 08/16/2024 1610   PROT 6.8 08/16/2024 1610   ALBUMIN  4.5 08/16/2024 1610   AST 16 08/16/2024 1610   ALT 14 08/16/2024 1610   ALKPHOS 80 08/16/2024 1610   BILITOT <0.2 08/16/2024 1610   GFRNONAA 75 07/06/2020 1158   GFRAA 86 07/06/2020 1158      Latest Ref Rng & Units 08/16/2024    4:10 PM 06/17/2024   12:15 PM 11/17/2023   10:32 AM  Hepatic Function  Total Protein 6.0 - 8.5 g/dL 6.8  6.9  6.6   Albumin  3.8 - 4.8 g/dL 4.5  4.4  4.4   AST 0 - 40 IU/L 16  15  15    ALT 0 - 32 IU/L 14  11  13    Alk Phosphatase 49 - 135 IU/L 80  91  100   Total Bilirubin 0.0 - 1.2 mg/dL <9.7  0.3  <9.7       Current Medications:   Current Outpatient Medications (Endocrine & Metabolic):    insulin  degludec (TRESIBA  FLEXTOUCH) 200 UNIT/ML FlexTouch Pen, Inject 56 Units into the skin every evening.  Current Outpatient Medications (Cardiovascular):    amLODipine (NORVASC) 5 MG tablet, Take 5 mg by mouth daily.   hydrochlorothiazide  (HYDRODIURIL ) 12.5 MG tablet, Take 1 tablet (12.5 mg total) by mouth daily.   irbesartan (AVAPRO) 150 MG tablet, Take 150 mg by mouth  daily.  Current Outpatient Medications (Respiratory):    albuterol  (PROVENTIL ) (2.5 MG/3ML) 0.083% nebulizer solution, Take 3 mLs (2.5 mg total) by nebulization every 6 (six) hours as needed for wheezing or shortness of breath.   Budeson-Glycopyrrol-Formoterol  (BREZTRI  AEROSPHERE) 160-9-4.8 MCG/ACT AERO, Inhale 2 puffs into the lungs in the morning and at bedtime.  Current Outpatient Medications (Hematological):    cyanocobalamin (VITAMIN B12) 1000 MCG tablet, Take 1,000 mcg by mouth daily.   rivaroxaban (XARELTO) 20 MG TABS tablet, Take by mouth at bedtime.  Current Outpatient Medications (Other):    ammonium lactate  (AMLACTIN) 12 % cream, Apply 1 Application topically as needed for dry skin.   BD PEN NEEDLE NANO 2ND GEN 32G X 4 MM MISC, 1 EACH BY DOES NOT APPLY ROUTE IN THE MORNING AND AT  BEDTIME.   Biotin 10000 MCG TABS, Take by mouth.   calcium  carbonate (TUMS - DOSED IN MG ELEMENTAL CALCIUM ) 500 MG chewable tablet, Chew 2 tablets by mouth daily as needed for indigestion.    Continuous Glucose Receiver (FREESTYLE LIBRE 3 READER) DEVI, Apply 1 each topically daily.   escitalopram (LEXAPRO) 10 MG tablet, Take 10 mg by mouth daily.   gabapentin  (NEURONTIN ) 600 MG tablet, Take 1 tablet (600 mg total) by mouth 3 (three) times daily.   Insulin  Pen Needle 31G X 8 MM MISC, Inject 1 Units into the skin daily.   mupirocin  ointment (BACTROBAN ) 2 %, Apply 1 Application topically 2 (two) times daily. (Patient taking differently: Apply 1 Application topically 2 (two) times daily. prn)   nystatin cream (MYCOSTATIN), Apply 1 Application topically 2 (two) times daily.   omeprazole  (PRILOSEC) 40 MG capsule, TAKE 1 CAPSULE (40 MG TOTAL) BY MOUTH IN THE MORNING AND AT BEDTIME.   ondansetron  (ZOFRAN ) 4 MG tablet, Take 1 tablet (4 mg total) by mouth every 8 (eight) hours as needed for nausea or vomiting.   triamcinolone  cream (KENALOG ) 0.1 %, Apply topically 2 (two) times daily.   hydrocortisone -pramoxine (PROCTOFOAM-HC) rectal foam, Place 1 applicator rectally 2 (two) times daily. 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) 12/13/2019   Morbid obesity  (HCC) 12/13/2019   Obstructive sleep apnea    PONV (postoperative nausea and vomiting)    not the last few times   RLS (restless legs syndrome)    S/P ablation of atrial flutter 02/12/2016   S/P lumbar fusion 08/04/2019   Sleep apnea    CPAP   Upper respiratory tract infection due to COVID-19 virus 08/14/2021   Allergies: Allergies[1]   Surgical History:  She  has a past surgical history that includes Colonoscopy (01/04/2013); ATRIAL FIBRILLATION ABLATION; Lumbar wound debridement (N/A, 08/09/2019); Cataract extraction (Bilateral); and Loop recorder implant (2025). Family History:  Her family history includes Breast cancer in her paternal grandmother; Breast cancer (age of onset: 36) in  her mother; Cancer in her brother; Colon cancer (age of onset: 103) in her paternal aunt; Heart Problems (age of onset: 47) in her brother; Heart attack (age of onset: 5) in her father; Mental illness in her sister.  REVIEW OF SYSTEMS  : All other systems reviewed and negative except where noted in the History of Present Illness.  PHYSICAL EXAM: BP 122/72   Pulse 70   Ht 5' 3 (1.6 m)   Wt 198 lb (89.8 kg)   SpO2 99%   BMI 35.07 kg/m  Physical Exam   MEASUREMENTS: Weight- 198. GENERAL APPEARANCE: Well nourished, in no apparent distress. HEENT: No cervical lymphadenopathy, unremarkable thyroid , sclerae anicteric, conjunctiva pink. RESPIRATORY: Respiratory effort normal, breath sounds clear to auscultation bilaterally without rales, rhonchi, or wheezing. CARDIO: Regular rate and rhythm with no murmurs, rubs, or gallops, peripheral pulses intact. ABDOMEN: Soft, non-distended, active bowel sounds in all four quadrants, tender to palpation, no rebound, no mass appreciated. RECTAL: Declines. MUSCULOSKELETAL: Full range of motion, normal gait, without edema. SKIN: Dry, intact without rashes or lesions. No jaundice. NEURO: Alert, oriented, no focal deficits. PSYCH: Cooperative, normal mood and affect.       Alan JONELLE Coombs, PA-C 2:52 PM      [1]  Allergies Allergen Reactions   Valsartan  Swelling   Atorvastatin      myalgias   Mounjaro  [Tirzepatide ] Other (See Comments)    constipation   Ozempic  (0.25 Or 0.5 Mg-Dose) [Semaglutide (0.25 Or 0.5mg -Dos)]     Constipation.   Rosuvastatin      myalgia   Codeine Nausea Only   "

## 2024-09-08 ENCOUNTER — Telehealth: Payer: Self-pay

## 2024-09-08 NOTE — Telephone Encounter (Signed)
 Dr. Sherre, patient will be scheduled as soon as possible.  Auth Submission: APPROVED Site of care: Site of care: [facility] Payer: [employer] medicare Medication & CPT/J Code(s) submitted: Leqvio (Inclisiran) [id number] Diagnosis Code:  Route of submission (phone, fax, portal): portal Phone # Fax # Auth type: Buy/Bill PB Units/visits requested: 284mg  x 3 doses Reference number: [id number] Approval from: 09/07/24 to 09/07/25   Approval letter is in the media tab.

## 2024-09-09 ENCOUNTER — Encounter: Payer: Self-pay | Admitting: *Deleted

## 2024-09-15 ENCOUNTER — Ambulatory Visit (HOSPITAL_COMMUNITY)
Admission: RE | Admit: 2024-09-15 | Discharge: 2024-09-15 | Disposition: A | Discharge status: Home / Self Care | Source: Ambulatory Visit | Attending: Physician Assistant | Admitting: Physician Assistant

## 2024-09-15 DIAGNOSIS — R109 Unspecified abdominal pain: Secondary | ICD-10-CM | POA: Diagnosis present

## 2024-09-15 MED ORDER — IOHEXOL 300 MG/ML  SOLN
100.0000 mL | Freq: Once | INTRAMUSCULAR | Status: AC | PRN
  Administered 2024-09-15: 100 mL via INTRAVENOUS

## 2024-09-20 ENCOUNTER — Ambulatory Visit: Admitting: Podiatry

## 2024-09-21 ENCOUNTER — Ambulatory Visit: Payer: Self-pay | Admitting: Physician Assistant

## 2024-09-22 NOTE — Telephone Encounter (Signed)
 Pt calling requesting results from CT scan.Pt was notified of the results and Alan Coombs PA recommendations.  Pt stated that she is still having the same symptoms of constipation and diarrhea and requesting what her next step of treatment may be.  Please review and advise.

## 2024-09-27 ENCOUNTER — Ambulatory Visit: Admitting: Family Medicine

## 2024-10-04 ENCOUNTER — Ambulatory Visit: Admitting: Podiatry

## 2024-11-04 ENCOUNTER — Ambulatory Visit: Admitting: Physician Assistant
